# Patient Record
Sex: Female | Born: 1940 | Race: Black or African American | Hispanic: No | Smoking: Never smoker
Health system: Southern US, Community
[De-identification: ages and names within clinical notes are randomized; demographics above are authoritative.]

## PROBLEM LIST (undated history)

## (undated) DIAGNOSIS — E119 Type 2 diabetes mellitus without complications: Secondary | ICD-10-CM

## (undated) DIAGNOSIS — H269 Unspecified cataract: Secondary | ICD-10-CM

## (undated) DIAGNOSIS — M1990 Unspecified osteoarthritis, unspecified site: Secondary | ICD-10-CM

## (undated) DIAGNOSIS — K219 Gastro-esophageal reflux disease without esophagitis: Secondary | ICD-10-CM

## (undated) DIAGNOSIS — R5383 Other fatigue: Secondary | ICD-10-CM

## (undated) DIAGNOSIS — I2510 Atherosclerotic heart disease of native coronary artery without angina pectoris: Secondary | ICD-10-CM

## (undated) DIAGNOSIS — I10 Essential (primary) hypertension: Secondary | ICD-10-CM

## (undated) DIAGNOSIS — Z86010 Personal history of colonic polyps: Secondary | ICD-10-CM

## (undated) DIAGNOSIS — K648 Other hemorrhoids: Secondary | ICD-10-CM

## (undated) DIAGNOSIS — F419 Anxiety disorder, unspecified: Secondary | ICD-10-CM

## (undated) DIAGNOSIS — T7840XA Allergy, unspecified, initial encounter: Secondary | ICD-10-CM

## (undated) DIAGNOSIS — K644 Residual hemorrhoidal skin tags: Secondary | ICD-10-CM

## (undated) DIAGNOSIS — D649 Anemia, unspecified: Secondary | ICD-10-CM

## (undated) DIAGNOSIS — K635 Polyp of colon: Secondary | ICD-10-CM

## (undated) DIAGNOSIS — R002 Palpitations: Secondary | ICD-10-CM

## (undated) DIAGNOSIS — I219 Acute myocardial infarction, unspecified: Secondary | ICD-10-CM

## (undated) DIAGNOSIS — Z79899 Other long term (current) drug therapy: Secondary | ICD-10-CM

## (undated) DIAGNOSIS — E785 Hyperlipidemia, unspecified: Secondary | ICD-10-CM

## (undated) HISTORY — PX: COLONOSCOPY: SHX174

## (undated) HISTORY — DX: Atherosclerotic heart disease of native coronary artery without angina pectoris: I25.10

## (undated) HISTORY — DX: Unspecified osteoarthritis, unspecified site: M19.90

## (undated) HISTORY — DX: Other fatigue: R53.83

## (undated) HISTORY — DX: Other hemorrhoids: K64.8

## (undated) HISTORY — PX: CARDIAC CATHETERIZATION: SHX172

## (undated) HISTORY — DX: Hyperlipidemia, unspecified: E78.5

## (undated) HISTORY — DX: Allergy, unspecified, initial encounter: T78.40XA

## (undated) HISTORY — DX: Anemia, unspecified: D64.9

## (undated) HISTORY — DX: Residual hemorrhoidal skin tags: K64.4

## (undated) HISTORY — DX: Polyp of colon: K63.5

## (undated) HISTORY — DX: Unspecified cataract: H26.9

## (undated) HISTORY — DX: Personal history of colonic polyps: Z86.010

## (undated) HISTORY — DX: Anxiety disorder, unspecified: F41.9

## (undated) HISTORY — PX: APPENDECTOMY: SHX54

## (undated) HISTORY — PX: ABDOMINAL HYSTERECTOMY: SHX81

## (undated) HISTORY — DX: Acute myocardial infarction, unspecified: I21.9

## (undated) HISTORY — PX: CHOLECYSTECTOMY: SHX55

---
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
## 1969-12-29 HISTORY — PX: TUBAL LIGATION: SHX77

## 1998-04-18 ENCOUNTER — Encounter: Admission: RE | Admit: 1998-04-18 | Discharge: 1998-04-18 | Payer: Self-pay | Admitting: Hematology and Oncology

## 1998-04-23 ENCOUNTER — Encounter: Admission: RE | Admit: 1998-04-23 | Discharge: 1998-04-23 | Payer: Self-pay | Admitting: Internal Medicine

## 1998-06-08 ENCOUNTER — Encounter: Admission: RE | Admit: 1998-06-08 | Discharge: 1998-06-08 | Payer: Self-pay | Admitting: Internal Medicine

## 1998-07-06 ENCOUNTER — Encounter: Admission: RE | Admit: 1998-07-06 | Discharge: 1998-07-06 | Payer: Self-pay | Admitting: Internal Medicine

## 1998-07-13 ENCOUNTER — Ambulatory Visit: Admission: RE | Admit: 1998-07-13 | Discharge: 1998-07-13 | Payer: Self-pay | Admitting: *Deleted

## 1998-08-03 ENCOUNTER — Ambulatory Visit: Admission: RE | Admit: 1998-08-03 | Discharge: 1998-08-03 | Payer: Self-pay | Admitting: *Deleted

## 1998-08-31 ENCOUNTER — Encounter: Admission: RE | Admit: 1998-08-31 | Discharge: 1998-08-31 | Payer: Self-pay | Admitting: Internal Medicine

## 1998-09-10 ENCOUNTER — Encounter: Admission: RE | Admit: 1998-09-10 | Discharge: 1998-09-10 | Payer: Self-pay | Admitting: Hematology and Oncology

## 1998-12-03 ENCOUNTER — Encounter: Admission: RE | Admit: 1998-12-03 | Discharge: 1998-12-03 | Payer: Self-pay | Admitting: Internal Medicine

## 1998-12-13 ENCOUNTER — Encounter: Admission: RE | Admit: 1998-12-13 | Discharge: 1998-12-13 | Payer: Self-pay | Admitting: Internal Medicine

## 1999-02-22 ENCOUNTER — Encounter: Admission: RE | Admit: 1999-02-22 | Discharge: 1999-02-22 | Payer: Self-pay | Admitting: Internal Medicine

## 1999-03-06 ENCOUNTER — Encounter: Admission: RE | Admit: 1999-03-06 | Discharge: 1999-03-06 | Payer: Self-pay | Admitting: Hematology and Oncology

## 1999-06-12 ENCOUNTER — Encounter: Admission: RE | Admit: 1999-06-12 | Discharge: 1999-06-12 | Payer: Self-pay | Admitting: Hematology and Oncology

## 1999-08-02 ENCOUNTER — Ambulatory Visit (HOSPITAL_COMMUNITY): Admission: RE | Admit: 1999-08-02 | Discharge: 1999-08-02 | Payer: Self-pay | Admitting: *Deleted

## 1999-11-05 ENCOUNTER — Encounter: Admission: RE | Admit: 1999-11-05 | Discharge: 1999-11-05 | Payer: Self-pay | Admitting: Internal Medicine

## 1999-11-08 ENCOUNTER — Encounter: Admission: RE | Admit: 1999-11-08 | Discharge: 1999-11-08 | Payer: Self-pay | Admitting: Internal Medicine

## 2000-03-27 ENCOUNTER — Ambulatory Visit (HOSPITAL_COMMUNITY): Admission: RE | Admit: 2000-03-27 | Discharge: 2000-03-27 | Payer: Self-pay | Admitting: Internal Medicine

## 2000-03-27 ENCOUNTER — Encounter: Admission: RE | Admit: 2000-03-27 | Discharge: 2000-03-27 | Payer: Self-pay | Admitting: Internal Medicine

## 2000-03-27 ENCOUNTER — Encounter: Payer: Self-pay | Admitting: Internal Medicine

## 2000-04-03 ENCOUNTER — Encounter: Admission: RE | Admit: 2000-04-03 | Discharge: 2000-04-03 | Payer: Self-pay | Admitting: Internal Medicine

## 2000-05-13 ENCOUNTER — Encounter: Admission: RE | Admit: 2000-05-13 | Discharge: 2000-05-13 | Payer: Self-pay | Admitting: Internal Medicine

## 2000-08-10 ENCOUNTER — Encounter: Admission: RE | Admit: 2000-08-10 | Discharge: 2000-08-10 | Payer: Self-pay | Admitting: Internal Medicine

## 2000-08-14 ENCOUNTER — Encounter: Admission: RE | Admit: 2000-08-14 | Discharge: 2000-08-14 | Payer: Self-pay | Admitting: *Deleted

## 2000-09-11 ENCOUNTER — Encounter: Admission: RE | Admit: 2000-09-11 | Discharge: 2000-09-11 | Payer: Self-pay | Admitting: Internal Medicine

## 2000-10-15 ENCOUNTER — Encounter: Admission: RE | Admit: 2000-10-15 | Discharge: 2000-10-15 | Payer: Self-pay | Admitting: Hematology and Oncology

## 2000-12-07 ENCOUNTER — Encounter: Admission: RE | Admit: 2000-12-07 | Discharge: 2000-12-07 | Payer: Self-pay | Admitting: Internal Medicine

## 2000-12-17 ENCOUNTER — Encounter: Admission: RE | Admit: 2000-12-17 | Discharge: 2000-12-17 | Payer: Self-pay | Admitting: Hematology and Oncology

## 2001-03-11 ENCOUNTER — Encounter: Admission: RE | Admit: 2001-03-11 | Discharge: 2001-03-11 | Payer: Self-pay | Admitting: Hematology and Oncology

## 2001-03-11 ENCOUNTER — Ambulatory Visit (HOSPITAL_COMMUNITY): Admission: RE | Admit: 2001-03-11 | Discharge: 2001-03-11 | Payer: Self-pay | Admitting: Hematology and Oncology

## 2001-06-07 ENCOUNTER — Encounter: Admission: RE | Admit: 2001-06-07 | Discharge: 2001-06-07 | Payer: Self-pay | Admitting: Internal Medicine

## 2001-08-20 ENCOUNTER — Encounter: Admission: RE | Admit: 2001-08-20 | Discharge: 2001-08-20 | Payer: Self-pay | Admitting: *Deleted

## 2001-10-06 ENCOUNTER — Encounter: Admission: RE | Admit: 2001-10-06 | Discharge: 2001-10-06 | Payer: Self-pay | Admitting: Internal Medicine

## 2001-11-24 ENCOUNTER — Encounter: Admission: RE | Admit: 2001-11-24 | Discharge: 2001-11-24 | Payer: Self-pay | Admitting: Internal Medicine

## 2002-01-14 ENCOUNTER — Encounter: Admission: RE | Admit: 2002-01-14 | Discharge: 2002-01-14 | Payer: Self-pay | Admitting: Internal Medicine

## 2002-02-25 ENCOUNTER — Ambulatory Visit (HOSPITAL_COMMUNITY): Admission: RE | Admit: 2002-02-25 | Discharge: 2002-02-25 | Payer: Self-pay | Admitting: Family Medicine

## 2002-02-25 ENCOUNTER — Encounter (INDEPENDENT_AMBULATORY_CARE_PROVIDER_SITE_OTHER): Payer: Self-pay | Admitting: Specialist

## 2002-04-20 ENCOUNTER — Encounter: Admission: RE | Admit: 2002-04-20 | Discharge: 2002-04-20 | Payer: Self-pay

## 2002-05-02 ENCOUNTER — Encounter: Admission: RE | Admit: 2002-05-02 | Discharge: 2002-05-02 | Payer: Self-pay | Admitting: Internal Medicine

## 2002-05-16 ENCOUNTER — Ambulatory Visit (HOSPITAL_COMMUNITY): Admission: RE | Admit: 2002-05-16 | Discharge: 2002-05-16 | Payer: Self-pay | Admitting: Internal Medicine

## 2002-05-16 ENCOUNTER — Encounter: Payer: Self-pay | Admitting: Internal Medicine

## 2002-05-16 ENCOUNTER — Encounter: Admission: RE | Admit: 2002-05-16 | Discharge: 2002-05-16 | Payer: Self-pay | Admitting: Internal Medicine

## 2002-05-20 ENCOUNTER — Encounter: Payer: Self-pay | Admitting: *Deleted

## 2002-05-20 ENCOUNTER — Inpatient Hospital Stay (HOSPITAL_COMMUNITY): Admission: EM | Admit: 2002-05-20 | Discharge: 2002-05-23 | Payer: Self-pay | Admitting: *Deleted

## 2002-05-25 ENCOUNTER — Emergency Department (HOSPITAL_COMMUNITY): Admission: EM | Admit: 2002-05-25 | Discharge: 2002-05-25 | Payer: Self-pay | Admitting: Emergency Medicine

## 2002-05-31 ENCOUNTER — Encounter: Admission: RE | Admit: 2002-05-31 | Discharge: 2002-05-31 | Payer: Self-pay | Admitting: Internal Medicine

## 2002-06-13 ENCOUNTER — Encounter: Admission: RE | Admit: 2002-06-13 | Discharge: 2002-06-13 | Payer: Self-pay | Admitting: Internal Medicine

## 2002-06-19 ENCOUNTER — Encounter: Payer: Self-pay | Admitting: Emergency Medicine

## 2002-06-19 ENCOUNTER — Emergency Department (HOSPITAL_COMMUNITY): Admission: EM | Admit: 2002-06-19 | Discharge: 2002-06-19 | Payer: Self-pay | Admitting: Emergency Medicine

## 2002-06-23 ENCOUNTER — Ambulatory Visit: Admission: RE | Admit: 2002-06-23 | Discharge: 2002-06-23 | Payer: Self-pay | Admitting: Internal Medicine

## 2002-06-24 ENCOUNTER — Encounter: Admission: RE | Admit: 2002-06-24 | Discharge: 2002-06-24 | Payer: Self-pay | Admitting: Internal Medicine

## 2002-07-25 ENCOUNTER — Encounter: Admission: RE | Admit: 2002-07-25 | Discharge: 2002-07-25 | Payer: Self-pay | Admitting: Internal Medicine

## 2002-08-08 ENCOUNTER — Encounter: Admission: RE | Admit: 2002-08-08 | Discharge: 2002-08-08 | Payer: Self-pay | Admitting: Internal Medicine

## 2002-08-12 ENCOUNTER — Ambulatory Visit (HOSPITAL_COMMUNITY): Admission: RE | Admit: 2002-08-12 | Discharge: 2002-08-12 | Payer: Self-pay | Admitting: Internal Medicine

## 2002-08-12 ENCOUNTER — Encounter: Payer: Self-pay | Admitting: Internal Medicine

## 2002-08-26 ENCOUNTER — Ambulatory Visit (HOSPITAL_COMMUNITY): Admission: RE | Admit: 2002-08-26 | Discharge: 2002-08-26 | Payer: Self-pay | Admitting: Internal Medicine

## 2002-09-19 ENCOUNTER — Encounter: Admission: RE | Admit: 2002-09-19 | Discharge: 2002-09-19 | Payer: Self-pay | Admitting: Internal Medicine

## 2002-09-20 ENCOUNTER — Ambulatory Visit (HOSPITAL_COMMUNITY): Admission: RE | Admit: 2002-09-20 | Discharge: 2002-09-20 | Payer: Self-pay | Admitting: Internal Medicine

## 2002-09-20 ENCOUNTER — Encounter: Admission: RE | Admit: 2002-09-20 | Discharge: 2002-09-20 | Payer: Self-pay | Admitting: Internal Medicine

## 2002-10-10 ENCOUNTER — Encounter: Admission: RE | Admit: 2002-10-10 | Discharge: 2002-10-10 | Payer: Self-pay | Admitting: Internal Medicine

## 2002-10-14 ENCOUNTER — Encounter: Admission: RE | Admit: 2002-10-14 | Discharge: 2002-10-14 | Payer: Self-pay | Admitting: Internal Medicine

## 2002-12-06 ENCOUNTER — Encounter: Admission: RE | Admit: 2002-12-06 | Discharge: 2002-12-06 | Payer: Self-pay | Admitting: Internal Medicine

## 2002-12-16 ENCOUNTER — Encounter: Payer: Self-pay | Admitting: *Deleted

## 2002-12-16 ENCOUNTER — Ambulatory Visit (HOSPITAL_COMMUNITY): Admission: RE | Admit: 2002-12-16 | Discharge: 2002-12-16 | Payer: Self-pay | Admitting: *Deleted

## 2003-01-11 ENCOUNTER — Encounter: Admission: RE | Admit: 2003-01-11 | Discharge: 2003-01-11 | Payer: Self-pay | Admitting: Internal Medicine

## 2003-01-27 ENCOUNTER — Encounter: Admission: RE | Admit: 2003-01-27 | Discharge: 2003-01-27 | Payer: Self-pay | Admitting: Internal Medicine

## 2003-01-31 ENCOUNTER — Ambulatory Visit (HOSPITAL_COMMUNITY): Admission: RE | Admit: 2003-01-31 | Discharge: 2003-01-31 | Payer: Self-pay | Admitting: Internal Medicine

## 2003-01-31 ENCOUNTER — Encounter: Payer: Self-pay | Admitting: Internal Medicine

## 2003-02-07 ENCOUNTER — Ambulatory Visit (HOSPITAL_COMMUNITY): Admission: RE | Admit: 2003-02-07 | Discharge: 2003-02-07 | Payer: Self-pay | Admitting: Internal Medicine

## 2003-02-14 ENCOUNTER — Encounter: Admission: RE | Admit: 2003-02-14 | Discharge: 2003-02-14 | Payer: Self-pay | Admitting: Internal Medicine

## 2003-05-26 ENCOUNTER — Ambulatory Visit (HOSPITAL_COMMUNITY): Admission: RE | Admit: 2003-05-26 | Discharge: 2003-05-26 | Payer: Self-pay | Admitting: Cardiovascular Disease

## 2003-05-26 ENCOUNTER — Encounter: Payer: Self-pay | Admitting: Cardiovascular Disease

## 2003-09-22 ENCOUNTER — Ambulatory Visit (HOSPITAL_COMMUNITY): Admission: RE | Admit: 2003-09-22 | Discharge: 2003-09-22 | Payer: Self-pay | Admitting: Cardiovascular Disease

## 2003-09-22 ENCOUNTER — Encounter: Payer: Self-pay | Admitting: Cardiovascular Disease

## 2004-04-01 ENCOUNTER — Encounter: Admission: RE | Admit: 2004-04-01 | Discharge: 2004-04-01 | Payer: Self-pay | Admitting: Cardiovascular Disease

## 2004-10-03 ENCOUNTER — Encounter: Admission: RE | Admit: 2004-10-03 | Discharge: 2004-10-03 | Payer: Self-pay | Admitting: Cardiovascular Disease

## 2005-10-22 ENCOUNTER — Encounter: Admission: RE | Admit: 2005-10-22 | Discharge: 2005-10-22 | Payer: Self-pay | Admitting: Cardiovascular Disease

## 2006-10-23 ENCOUNTER — Encounter: Admission: RE | Admit: 2006-10-23 | Discharge: 2006-10-23 | Payer: Self-pay | Admitting: Cardiovascular Disease

## 2007-02-23 ENCOUNTER — Encounter (HOSPITAL_COMMUNITY): Admission: RE | Admit: 2007-02-23 | Discharge: 2007-02-23 | Payer: Self-pay | Admitting: Cardiovascular Disease

## 2007-02-26 ENCOUNTER — Ambulatory Visit (HOSPITAL_COMMUNITY): Admission: RE | Admit: 2007-02-26 | Discharge: 2007-02-26 | Payer: Self-pay | Admitting: Cardiovascular Disease

## 2007-10-08 ENCOUNTER — Emergency Department (HOSPITAL_COMMUNITY): Admission: EM | Admit: 2007-10-08 | Discharge: 2007-10-08 | Payer: Self-pay | Admitting: Emergency Medicine

## 2007-10-26 ENCOUNTER — Encounter: Admission: RE | Admit: 2007-10-26 | Discharge: 2007-10-26 | Payer: Self-pay | Admitting: Cardiovascular Disease

## 2007-11-22 ENCOUNTER — Inpatient Hospital Stay (HOSPITAL_COMMUNITY): Admission: EM | Admit: 2007-11-22 | Discharge: 2007-11-24 | Payer: Self-pay | Admitting: Emergency Medicine

## 2008-10-27 ENCOUNTER — Encounter: Admission: RE | Admit: 2008-10-27 | Discharge: 2008-10-27 | Payer: Self-pay | Admitting: Cardiovascular Disease

## 2009-04-20 ENCOUNTER — Ambulatory Visit (HOSPITAL_COMMUNITY): Admission: RE | Admit: 2009-04-20 | Discharge: 2009-04-20 | Payer: Self-pay | Admitting: Obstetrics & Gynecology

## 2009-10-30 ENCOUNTER — Encounter: Admission: RE | Admit: 2009-10-30 | Discharge: 2009-10-30 | Payer: Self-pay | Admitting: Cardiovascular Disease

## 2010-10-17 ENCOUNTER — Encounter: Admission: RE | Admit: 2010-10-17 | Discharge: 2010-10-17 | Payer: Self-pay | Admitting: Cardiovascular Disease

## 2010-11-01 ENCOUNTER — Encounter: Admission: RE | Admit: 2010-11-01 | Discharge: 2010-11-01 | Payer: Self-pay | Admitting: Cardiovascular Disease

## 2011-05-13 NOTE — Discharge Summary (Signed)
NAMEShiniqua, [name]                  ACCOUNT NO.:  [id number]   MEDICAL RECORD NO.:  [id number]          PATIENT TYPE:  INP   LOCATION:  3743                         FACILITY:  [facility]   PHYSICIAN:  Mohan N. Sharyn Lull, M.D. DATE OF BIRTH:  04/12/1941   DATE OF ADMISSION:  11/22/2007  DATE OF DISCHARGE:  11/24/2007                               DISCHARGE SUMMARY   ADMITTING DIAGNOSIS:  1. Status post syncope, rule out cardiac arrhythmias, rule out      hypoglycemia, doubt cerebrovascular accident.  2. Leukocytosis.  3. Rule out sepsis or bronchitis.  4. Rule out pneumonia.  5. Hypertension.  6. Diabetes mellitus.  7. Hypercholesteremia.  8. History of bronchial asthma.   DISCHARGE DIAGNOSIS:  1. Status post syncope probably secondary to vasovagal versus      hypoglycemia.  2. Status post leukocytosis.  3. Resolving bronchitis.  4. Hypertension.  5. Diabetes mellitus.  6. Hypercholesteremia.  7. History of bronchial asthma.   DISCHARGE HOME MEDICATIONS:  1. Metoprolol XL 50 mg one tablet daily.  2. Protonix 40 mg one tablet daily.  3. Lipitor 40 mg p.o. daily.  4. Enteric-coated aspirin 81 mg p.o. daily.  5. Darvocet-N 100 1 tablet q.8 h. as needed.  6. Singulair 10 mg one tablet daily.  7. Glipizide has been reduced to 5 mg 1 tablet daily.  8. Nitrostat sublingual 0.4 mg as needed.  9. Albuterol inhaler 2 puffs q.6 h. as needed.  10.Avelox 400 mg one tablet daily x 5 days.   DIET:  Low salt, low cholesterol, 1800 calories, ADA diet.  The patient  has been advised to monitor blood sugar twice daily.   FOLLOWUP:  With Dr. Algie Coffer in 2 weeks.   CONDITION AT DISCHARGE:  Stable.   BRIEF HISTORY AND HOSPITAL COURSE:  [name] is a 70 year old black  female with past medical history significant for hypertension, non-  insulin-dependent diabetes mellitus, hypercholesteremia, and history of  bronchial asthma.  She came to the ER via EMS following syncopal  episode.  States she  went to see the ophthalmologist and when she came  home while eating dinner, slipped on the floor.  Denies any chest pain,  dizziness, or palpitation prior to passing out.  She denies seizure  activity or head trauma.  Denies any incontinence of urine or feces,  denies any weakness in the arms or legs.  Denies slurred speech.  The  patient denies checking the blood sugar at the time of the syncopal  episode, but after EMS was noted to have blood sugar of 131.  The  patient complains of chronic right-sided headache, and is being followed  by ophthalmologist.   PAST MEDICAL HISTORY:  As above.   PAST SURGICAL HISTORY:  She had cholecystectomy in the past,  hysterectomy in the past, tubal ligation in the past.   ALLERGIES:  She has intolerance to aspirin.   MEDICATION AT HOME:  As above, except glipizide which  she was taking  twice a day.   SOCIAL HISTORY:  She is separated, three children.  No history of  smoking or alcohol abuse, retired.  She worked in [employer] in the  past.   FAMILY HISTORY:  Father died of stroke in his 28s.  Mother died of old  age at the age of 10.  She had MI x5 and subsequently had CABG.  Grandmother died of stroke.  One brother had stroke and heart attack.  One sister had an MI.   PHYSICAL EXAMINATION:  GENERAL:  On examination she is alert and  oriented x3 in no acute distress.  VITAL SIGNS:  Blood pressure is 116/63, pulse was 83, regular.  Conjunctivae was pink.  NECK:  Supple.  No JVD.  LUNGS:  Clear to auscultation without rhonchi or rales.  CARDIOVASCULAR:  Regular rate, no murmurs.  S1-S2 was normal.  There was  a soft systolic murmur.  ABDOMEN:  Soft.  Bowel sounds were present, nontender.  EXTREMITIES:  There was no clubbing, cyanosis or edema.  NEURO:  Grossly intact.   LABS:  EKG showed normal sinus rhythm with LVH with strain pattern.  Her  hemoglobin was 14.2, hematocrit 42.6, white count of 22.9.  Repeat white  count, today, is  11.7, hemoglobin 12.6, hematocrit 38.  BUN was 30,  potassium 1.4.  Today, BUN is 14, creatinine 1.05, potassium is 3.6.  A  CT of the head was negative.  Chest x-ray was negative.   The patient was admitted to telemetry unit.  The patient did not have  any episode of cardiac arrhythmias during the hospital stay.  The  patient had one episode of hypoglycemia despite reducing the dose of  glipizide, with a blood sugar of 43.  The patient's CT of the brain was  negative.  Neurologically, the patient she shows no evidence of neuro  deficit.  The patient will be discharged home on the above medications  and will be followed up by Dr. Algie Coffer in 2 weeks.     Eduardo Osier. Sharyn Lull, M.D.  Electronically Signed    MNH/MEDQ  D:  11/24/2007  T:  11/24/2007  Job:  [id number]

## 2011-05-16 NOTE — Consult Note (Signed)
[place]. [facility]  Patient:    Heidi Wolf, [name] [facility] M Visit Number: [id number] MRN: [id number]          Service Type: MED Location: [phone] Attending Physician:  Karn Pickler Dictated by:   Kerrie Pleasure, M.D. Admit Date:  05/20/2002 Discharge Date: 05/23/2002                            Consultation Report  REFERRING PHYSICIAN:  Alvester Morin, M.D.  REASON FOR CONSULTATION:  Evaluation of chest pain.  HISTORY OF PRESENT ILLNESS:  [name] is a 70 year old African-American female with a history of longstanding hypertension, GERD, hyperlipidemia, and colonic polyps.  She was subsequently treated for "bronchitis."  She presented to the outpatient clinic with history of recurrent chest pain over the last couple of weeks that has been on and off but has now become more constant and progressive at a 6 to 8 out of 10.  The pain was described as pressure-like, worsened by exertion that has made the patient unable to do her ADLs.  The pain is partially relieved by rest.  The pain also radiates to her neck and her left arm and associated with some diaphoresis but no fever.  The patient has had a cough that has decreased over the last week since she has been given doxycycline for presumed treatment of bronchitis.  She was asked to come to the ER where she was given nitroglycerin and a GI cocktail.  The pain has eased up a little after nitroglycerin.  The patient has extensive cardiac risk factors including her hypertension, family history of coronary artery disease, and hyperlipidemia.  PAST MEDICAL HISTORY:  Hypertension, hyperlipidemia, GERD, guaiac positive stool for which she was status post EGD and colonoscopy.  ALLERGIES:  The patient had allergy to ASPIRIN but it mainly causes GI discomfort.  MEDICATIONS: 1. At this point, she has been placed on aspirin 325 mg q.d. 2. Lotensin 20 mg q.d. 3. Dyazide 37.5 mg q.d. 4. Doxycycline 100 mg p.o.  b.i.d. 5. Protonix 40 mg q.d. 6. Lipitor 10 mg q.d. 7. K-Dur 1 q.d.  SOCIAL HISTORY:  Lives in [place].  She is separated from her husband.  She has three grandchildren.  She works as a Printmaker.  She denies any tobacco or alcohol history.  FAMILY HISTORY:  Significant for her mom having coronary artery disease at the age of 7s.  She died of a MI in her 1s.  Dad died of CVA.  She has two siblings, both with coronary artery disease with onset in their 68s.  REVIEW OF SYSTEMS:  Essentially as per HPI.  PHYSICAL EXAMINATION:  VITAL SIGNS:  Temperature 98.8, blood pressure 106/59, pulse of 72, respiratory rate of 18.  O2 saturation of 95% on room air.  GENERAL:  She is stable, alert, and oriented.  Not in acute distress.  HEENT:  PERRL.  EOMI.  CHEST:  Clear to auscultation and symmetrical.  NECK:  Supple.  No JVD.  No lymphadenopathy.  CARDIOVASCULAR:  Regular rate and rhythm.  She did have audible S3 and S4.  ABDOMEN:  Obese, soft, nontender, and nondistended.  Positive bowel sounds.  EXTREMITIES:  No clubbing, cyanosis, or edema.  LABORATORY DATA:  Her initial labs included a white count of 9.7, hemoglobin 13, platelets 269,000.  Sodium 135, potassium 3.5, chloride 104, CO2 25, BUN 13, creatinine 0.9, glucose 113, and calcium 9.3.  Her cardiac enzymes showed a CK of 195, 160, and 177 respectively.  MB fraction of 4.1 to 6 respectively and an index of 2.1, 2.8, to 3.4.  Her troponin was flat at 0.02 x 3.  Her EKG shows a normal rate, normal axis, question of a prolonged QTC. Otherwise, no ST segment changes.  IMPRESSION:  This is a 70 year old African-American female with cardiac risk factors who presented with exertional chest pain and had some mild enzyme changes.  ASSESSMENT/PLAN: 1. Chest pain:  This is gone right now.  Probably secondary to unstable    angina.  The patient will benefit from cardiac catheterization at this    point mainly  because of her risk factors and the fact that she came in with    with this chest pain, especially given her family history, history of    hypertension and dyslipidemia.  She is really at high risk for coronary    artery disease.  So, we will proceed with coronary angiography.  The    options I discussed with the patient including the risks and benefits    of obtaining a catheterization at this point.  Other recommendations will    be to do a 2-D echocardiogram on the patient, assess her lipid function,    enteric-coated aspirin, start heparin, some Plavix, and then a beta    blocker.  We will also continue recycle her enzymes.  If her chest pain    returns, we will start nitroglycerin drip. 2. Hypertension:  Her blood pressure seems to be reasonably controlled at    this point.  So, we will make no changes. 3. Hyperlipidemia:  The patient is already on Lipitor and we will check her    lipid profile for bronchitis.  The patient was already taking doxycycline.    She is in her first week.  We will let her continue to this dose of    doxycycline. Dictated by:   Kerrie Pleasure, M.D. Attending Physician:  Karn Pickler DD:  05/21/02 TD:  05/24/02 Job: [id number]

## 2011-05-16 NOTE — Cardiovascular Report (Signed)
Heidi Wolf, Heidi Wolf                  ACCOUNT NO.:  [id number]   MEDICAL RECORD NO.:  [id number]          PATIENT TYPE:  OIB   LOCATION:  2899                         FACILITY:  [facility]   PHYSICIAN:  Ricki Rodriguez, M.D.  DATE OF BIRTH:  19-Jan-1941   DATE OF PROCEDURE:  02/26/2007  DATE OF DISCHARGE:                            CARDIAC CATHETERIZATION   The patient was seen as an outpatient.   PROCEDURES:  1. Left heart catheterization.  2. Selective coronary angiography.  3. Left ventricular function study.   INDICATIONS:  This 70 year old black female had recurrent chest pain,  abnormal nuclear stress test along with diabetes and hypertension.   APPROACH:  Right femoral artery using 4-French sheath and catheters.   COMPLICATIONS:  None.   Less than 50 mL of dye was used.   HEMODYNAMIC DATA:  The left ventricular pressure was 130/13 and the  aortic pressure was 131/72.   CORONARY ANATOMY:  1. The left main coronary artery was short and unremarkable.  2. Left anterior descending coronary artery:  The left anterior      descending coronary artery was unremarkable.  3. Left circumflex coronary artery:  The left circumflex coronary      artery was also unremarkable and its obtuse marginal branches were      also normal.  4. Right coronary artery:  The right artery was dominant and      unremarkable.   LEFT VENTRICULOGRAM:  The left ventriculogram showed hyperdynamic wall  motion with ejection fraction of 75%.   IMPRESSION:  1. Normal coronaries.  2. Hypertensive heart disease.   RECOMMENDATIONS:  This patient will be treated medically, and I will  continue her home medications with the increase in her potassium intake.      Ricki Rodriguez, M.D.  Electronically Signed     ASK/MEDQ  D:  02/26/2007  T:  02/26/2007  Job:  [id number]

## 2011-05-16 NOTE — Discharge Summary (Signed)
[place]. [facility]  Patient:    Heidi Wolf, Heidi Wolf Visit Number: [id number] MRN: [id number]          Service Type: MED Location: [phone] Attending Physician:  Karn Pickler Dictated by:   Asencion Partridge. Neva Seat, M.D. Admit Date:  05/20/2002 Discharge Date: 05/23/2002   CC:         Outpatient Clinic   Discharge Summary  DATE OF BIRTH:  Aug 10, 1941  ADMISSION DIAGNOSES: 1. Chest pain. 2. Hypertension. 3. Hyperlipidemia. 4. Acute bronchitis.  DISCHARGE DIAGNOSES: 1. Noncardiac chest pain. 2. Hypertension. 3. Hyperlipidemia. 4. Acute bronchitis.  HISTORY OF PRESENT ILLNESS:  The patient is a 70 year old African-American female with a history of hypertension and hypercholesterolemia who presented to the ER with substernal chest pain radiating to neck and into chest, worse that morning.  Also with palpitations, diaphoresis, shortness of breath, and headache, and nausea.  No emesis and no diarrhea.  The patient was diagnosed with an acute bronchitis and has been on doxycycline b.i.d. for four days. The patient says she has had symptoms for five weeks in regards to her cough, initially treated with Allegra and a nose spray.  The patients acute chest pain had been present that morning.  The patient denied paroxysmal nocturnal dyspnea, however, did have some slight dyspnea on exertion which has been notably increasing, worse with housework.  Chest pain had for the most part resolved, but slightly present in the ER.  No attempt at treatments.  MEDICATIONS: 1. Lotensin 20 mg p.o. q.d. 2. Hydrochlorothiazide 25 mg p.o. q.d. 3. Lipitor 10 mg p.o. q.h.s. 4. Doxycycline 100 mg p.o. b.i.d. 5. Protonix 40 mg p.o. q.d.  FAMILY HISTORY:  Family history of coronary artery disease, as her mother had five MIs.  She has a brother with history of two CVAs and one MI.  Two sisters, one with angina and other with an MI in her 27s.  Also with diabetes in the  mother, sister, nieces, and daughter.  SOCIAL HISTORY:  The patient is a nonsmoker.  PHYSICAL EXAMINATION:  VITAL SIGNS: Temperature 97.8, blood pressure 129/79, pulse 114, respiratory rate 29, 80% sat on room air.  GENERAL: She is in no acute distress.  For the most part comfortable.  HEART: Regular rate and rhythm with no murmurs, rubs, or gallops.  No heaves, rubs, or thrills. Peripheral pulses were intact.  She was nondiaphoretic.  LABORATORY DATA:  Sodium 136, potassium 3.4, chloride 102, bicarb 25, BUN 22, creatinine 1.0, glucose 113, calcium 9.9.  AST 32, ALT 33, alkaline phosphatase 59, total bilirubin 0.8, albumin 4.0, total protein 7.5.  CBC; white blood cell count 11.2, hemoglobin 13.5, hematocrit 39, platelets 279, 66% neutrophils, ANC 7.4.  Chest x-ray showed no acute disease, no change from prior chest x-ray.  EKG was normal sinus rhythm, somewhat flattened Ts, however, no ST changes.  CK 195, CK-MB 4.1, index 2.1, troponin 0.02.  HOSPITAL COURSE:  The patient was admitted to the medicine teaching service B attending Dr. Eugenio Hoes for rule out MI as she had a strong family history and other risk factors including hypertension, her age, and hyperlipidemia.  Symptoms were also noted to be typical which is atypical for a female having MI, however, with the patients strong history was admitted, placed on her statin, low dose beta blocker, sublingual nitroglycerin p.r.n. which she did not require, and underwent serial enzymes.  #1 - Chest pain.  On the morning after admission chest pain was much less,  however, cardiology consult was obtained as the patient with several risk factors and a strong family history.  Decision made by cardiology to undergo cardiac catheterization.  Cardiac catheterization performed on May 23, 2002, was completely negative for coronary artery disease, negative for mitral regurgitation, ejection fraction was 65%.  Per cardiology this was  noncardiac chest pain and the patient was stable for discharge.  #2 - Hypertension.  The patient was restarted on her home regimen of Lotensin 20 mg p.o. q.d. and hydrochlorothiazide 25 mg q.d. in addition to metoprolol 12.5 mg b.i.d. for cardioprotective effects.  The patients blood pressure remained stable throughout hospitalization and as she was normotensive on admission, she was discharged on her home regimen.  #3 - Hypercholesterolemia.  The patient with hypercholesterolemia by history, on Lipitor at home, was placed on Zocor in the hospital.  Fasting lipid panel obtained in the hospital; total cholesterol 216, triglycerides 246, HDL 44, LDL 123.  The patient discharged to home on same previously which is Lipitor 10 mg p.o. q.h.s.  #4 - Bronchitis.  Symptoms appear to be improving.  The patient was continued on doxycycline 100 mg b.i.d. in hospital.  Will discharge her home on same dose and was to complete her regimen of doxycycline for treatment of acute bronchitis.  #5 - The patient with history of GERD, was continued on Protonix during hospitalization and discharged to home on the same.  The patient has a "allergy" to aspirin in the past, but stated that she did not take this because of the possible stomach problems, however, as recommended by cardiology postcath, the patient was started on 81 mg enteric-coated aspirin q.d.  She had received 325 mg in the hospital, however, discharging her home on 81 mg p.o. q.d.  Decision to stop this may be made by the patients primary M.D. if so desired, however, she will be on Protonix for GI protection.  CONDITION ON DISCHARGE:  The patient is in stable condition for discharge on May 23, 2002.  DISCHARGE MEDICATIONS: 1. Lotensin 20 mg one tablet p.o. q.d. 2. Lipitor 10 mg one tablet p.o. q.h.s. 3. Doxycycline 100 mg one tablet p.o. b.i.d. 4. Protonix 40 mg one tablet p.o. q.d. 5. Hydrochlorothiazide 25 mg one tablet p.o. q.d.  6.  Enteric-coated aspirin 81 mg one tablet p.o. q.d.  ACTIVITY:  The patient was instructed on no lifting, driving, sexual activity, or heavy exertion for two days.  DIET:  Low salt, low fat, low cholesterol diet.  The patient was to observe the cardiac catheterization site for bruising, swelling, drainage, or discomfort and was given the number [phone] if any problems were noted.  FOLLOW-UP:  The patient was to call the [facility] at [phone] to schedule an appointment with her primary physician, Dr. Darnelle Catalan in two weeks for a cardiac catheterization site groin wound check and for hospital follow-up. Dictated by:   Asencion Partridge Neva Seat, M.D. Attending Physician:  Karn Pickler DD:  05/23/02 TD:  05/25/02 Job: [id number]

## 2011-05-16 NOTE — Cardiovascular Report (Signed)
[place]. [facility]  Patient:    Heidi Wolf, [place] [facility] M Visit Number: [id number] MRN: [id number]          Service Type: MED Location: [phone] Attending Physician:  Karn Pickler Dictated by:   Lewayne Bunting, M.D. [facility] Proc. Date: 05/23/02 Admit Date:  05/20/2002 Discharge Date: 05/23/2002   CC:         Dr. Gaspar Skeeters, M.D.   Cardiac Catheterization  DATE OF BIRTH: September 25, 1941  REFERRING PHYSICIAN: Dr. Eugenio Hoes with [facility] Service.  CARDIOLOGIST: Veneda Melter, M.D.  PROCEDURES PERFORMED: 1. Left heart catheterization with selective coronary angiography. 2. Ventriculography.  DIAGNOSES: 1. No significant flow-limiting epicardial coronary artery disease. 2. Normal left ventricular systolic function.  INDICATIONS: The patient is a 70 year old female with cardiac risk factors, who was admitted with substernal chest pain. The patient has ruled out for myocardial infarction. She was referred for a diagnostic catheterization to assess her coronary anatomy.  DESCRIPTION OF PROCEDURE: After informed consent was obtained, the patient was brought to the catheterization laboratory. The right groin was sterilely prepped and draped.  Lidocaine 1% was infiltrated. A 6 French arterial sheath was placed using the modified Seldinger technique.  Subsequently, a 6 [place] and JR4 standard left and right heart catheters were used to engage the left and right coronary ostia, respectively.  Selective coronary angiography was performed in various projections using manual injections of contrast. Following coronary angiography, a 6 French angled pigtail catheter was placed in the left ventricular cavity. Appropriate left-sided hemodynamics were obtained. Ventriculography was then performed in a single plane RAO projection using power injection of contrast. Subsequently, the catheter was pulled back and all catheter and sheaths  were removed and the patient was brought back to the holding area. No complications were encountered. Adequate hemostasis was provided.  FINDINGS:  HEMODYNAMICS: Left ventricular pressure 117/20 mmHg, aortic pressure 117/77 mmHg.  There was no gradient on aortic pullback.  VENTRICULOGRAPHY: Ejection fraction 65% with no segmental wall motion abnormalities.  There was no mitral regurgitation noted.  SELECTIVE CORONARY ANGIOGRAPHY: 1. Left main coronary is a large caliber vessel with no evidence of    flow-limiting CAD. 2. Left anterior descending artery was a large caliber vessel wrapping    around the apex with no evidence of flow-limiting CAD. 3. Left circumflex is a large caliber vessel also giving rise to two large    obtuse marginal branches with no evidence of flow-limiting disease. 4. The right coronary artery was free of flow-limiting epicardial coronary    artery disease.  RECOMMENDATIONS: Further medical therapy for noncardiac chest pain as indicated. The patient can be discharged after four hours of bed rest later today. No further cardiac work-up is needed. At this point in time, the patients chest pain origin appears to be of noncardiac nature. Dictated by:   Lewayne Bunting, M.D. [facility] Attending Physician:  Karn Pickler DD:  05/23/02 TD:  05/24/02 Job: [id number]

## 2011-09-21 ENCOUNTER — Emergency Department (HOSPITAL_COMMUNITY)
Admission: EM | Admit: 2011-09-21 | Discharge: 2011-09-22 | Disposition: A | Discharge status: Home / Self Care | Payer: Medicare Other | Attending: Emergency Medicine | Admitting: Emergency Medicine

## 2011-09-21 DIAGNOSIS — I498 Other specified cardiac arrhythmias: Secondary | ICD-10-CM | POA: Insufficient documentation

## 2011-09-21 DIAGNOSIS — R0602 Shortness of breath: Secondary | ICD-10-CM | POA: Insufficient documentation

## 2011-09-21 DIAGNOSIS — R079 Chest pain, unspecified: Secondary | ICD-10-CM | POA: Insufficient documentation

## 2011-09-21 DIAGNOSIS — I10 Essential (primary) hypertension: Secondary | ICD-10-CM | POA: Insufficient documentation

## 2011-09-21 DIAGNOSIS — Z79899 Other long term (current) drug therapy: Secondary | ICD-10-CM | POA: Insufficient documentation

## 2011-09-21 DIAGNOSIS — E119 Type 2 diabetes mellitus without complications: Secondary | ICD-10-CM | POA: Insufficient documentation

## 2011-09-21 LAB — POCT I-STAT TROPONIN I: Troponin i, poc: 0.01 ng/mL (ref 0.00–0.08)

## 2011-09-22 ENCOUNTER — Emergency Department (HOSPITAL_COMMUNITY): Payer: Medicare Other

## 2011-09-22 LAB — CBC
HCT: 36.4 % (ref 36.0–46.0)
Hemoglobin: 12.1 g/dL (ref 12.0–15.0)
MCH: 26.8 pg (ref 26.0–34.0)
MCHC: 33.2 g/dL (ref 30.0–36.0)
MCV: 80.7 fL (ref 78.0–100.0)
Platelets: 262 10*3/uL (ref 150–400)
RBC: 4.51 MIL/uL (ref 3.87–5.11)
RDW: 14.5 % (ref 11.5–15.5)
WBC: 9.7 10*3/uL (ref 4.0–10.5)

## 2011-09-22 LAB — DIFFERENTIAL
Basophils Absolute: 0 10*3/uL (ref 0.0–0.1)
Basophils Relative: 0 % (ref 0–1)
Eosinophils Absolute: 0.1 10*3/uL (ref 0.0–0.7)
Eosinophils Relative: 1 % (ref 0–5)
Lymphocytes Relative: 36 % (ref 12–46)
Lymphs Abs: 3.4 10*3/uL (ref 0.7–4.0)
Monocytes Absolute: 0.6 10*3/uL (ref 0.1–1.0)
Monocytes Relative: 6 % (ref 3–12)
Neutro Abs: 5.6 10*3/uL (ref 1.7–7.7)
Neutrophils Relative %: 58 % (ref 43–77)

## 2011-09-22 LAB — BASIC METABOLIC PANEL
BUN: 16 mg/dL (ref 6–23)
CO2: 26 mEq/L (ref 19–32)
Calcium: 8.9 mg/dL (ref 8.4–10.5)
Chloride: 106 mEq/L (ref 96–112)
Creatinine, Ser: 0.95 mg/dL (ref 0.50–1.10)
GFR calc Af Amer: >60 mL/min (ref >60)
GFR calc non Af Amer: 58 mL/min — ABNORMAL LOW (ref >60)
Glucose, Bld: 127 mg/dL — ABNORMAL HIGH (ref 70–99)
Potassium: 3.2 mEq/L — ABNORMAL LOW (ref 3.5–5.1)
Sodium: 140 mEq/L (ref 135–145)

## 2011-09-24 ENCOUNTER — Inpatient Hospital Stay (HOSPITAL_COMMUNITY)
Admission: AD | Admit: 2011-09-24 | Discharge: 2011-09-25 | DRG: 287 | Disposition: A | Discharge status: Home / Self Care | Payer: Medicare Other | Source: Ambulatory Visit | Attending: Cardiovascular Disease | Admitting: Cardiovascular Disease

## 2011-09-24 ENCOUNTER — Encounter (HOSPITAL_COMMUNITY): Payer: Self-pay | Admitting: Radiology

## 2011-09-24 ENCOUNTER — Inpatient Hospital Stay (HOSPITAL_COMMUNITY): Payer: Medicare Other

## 2011-09-24 DIAGNOSIS — Z79899 Other long term (current) drug therapy: Secondary | ICD-10-CM

## 2011-09-24 DIAGNOSIS — E7800 Pure hypercholesterolemia, unspecified: Secondary | ICD-10-CM | POA: Diagnosis present

## 2011-09-24 DIAGNOSIS — E119 Type 2 diabetes mellitus without complications: Secondary | ICD-10-CM | POA: Diagnosis present

## 2011-09-24 DIAGNOSIS — E785 Hyperlipidemia, unspecified: Secondary | ICD-10-CM | POA: Diagnosis present

## 2011-09-24 DIAGNOSIS — Z7982 Long term (current) use of aspirin: Secondary | ICD-10-CM

## 2011-09-24 DIAGNOSIS — I10 Essential (primary) hypertension: Secondary | ICD-10-CM | POA: Diagnosis present

## 2011-09-24 DIAGNOSIS — R0789 Other chest pain: Principal | ICD-10-CM | POA: Diagnosis present

## 2011-09-24 HISTORY — DX: Essential (primary) hypertension: I10

## 2011-09-24 LAB — CBC
HCT: 39.8 % (ref 36.0–46.0)
Hemoglobin: 13.2 g/dL (ref 12.0–15.0)
MCH: 26.6 pg (ref 26.0–34.0)
MCHC: 33.2 g/dL (ref 30.0–36.0)
MCV: 80.1 fL (ref 78.0–100.0)
Platelets: 274 10*3/uL (ref 150–400)
RBC: 4.97 MIL/uL (ref 3.87–5.11)
RDW: 14.2 % (ref 11.5–15.5)
WBC: 9.3 10*3/uL (ref 4.0–10.5)

## 2011-09-24 LAB — DIFFERENTIAL
Basophils Absolute: 0 10*3/uL (ref 0.0–0.1)
Basophils Relative: 0 % (ref 0–1)
Eosinophils Absolute: 0.1 10*3/uL (ref 0.0–0.7)
Eosinophils Relative: 1 % (ref 0–5)
Lymphocytes Relative: 29 % (ref 12–46)
Lymphs Abs: 2.7 10*3/uL (ref 0.7–4.0)
Monocytes Absolute: 0.6 10*3/uL (ref 0.1–1.0)
Monocytes Relative: 7 % (ref 3–12)
Neutro Abs: 5.8 10*3/uL (ref 1.7–7.7)
Neutrophils Relative %: 63 % (ref 43–77)

## 2011-09-24 LAB — CARDIAC PANEL(CRET KIN+CKTOT+MB+TROPI)
CK, MB: 6.6 ng/mL (ref 0.3–4.0)
Relative Index: 2.5 (ref 0.0–2.5)
Total CK: 268 U/L — ABNORMAL HIGH (ref 7–177)
Troponin I: <0.3 ng/mL (ref <0.30)

## 2011-09-24 LAB — PROTIME-INR
INR: 1.01 (ref 0.00–1.49)
Prothrombin Time: 13.5 seconds (ref 11.6–15.2)

## 2011-09-24 LAB — APTT: aPTT: 27 seconds (ref 24–37)

## 2011-09-25 LAB — CARDIAC PANEL(CRET KIN+CKTOT+MB+TROPI)
CK, MB: 6.7 ng/mL (ref 0.3–4.0)
CK, MB: 5.6 ng/mL — ABNORMAL HIGH (ref 0.3–4.0)
Relative Index: 2.6 — ABNORMAL HIGH (ref 0.0–2.5)
Relative Index: 2.5 (ref 0.0–2.5)
Total CK: 260 U/L — ABNORMAL HIGH (ref 7–177)
Total CK: 223 U/L — ABNORMAL HIGH (ref 7–177)
Troponin I: <0.3 ng/mL (ref <0.30)
Troponin I: <0.3 ng/mL (ref <0.30)

## 2011-09-25 LAB — POCT I-STAT, CHEM 8
BUN: 11 mg/dL (ref 6–23)
Calcium, Ion: 1.19 mmol/L (ref 1.12–1.32)
Chloride: 91 mEq/L — ABNORMAL LOW (ref 96–112)
Creatinine, Ser: 0.7 mg/dL (ref 0.50–1.10)
Glucose, Bld: 92 mg/dL (ref 70–99)
HCT: 42 % (ref 36.0–46.0)
Hemoglobin: 14.3 g/dL (ref 12.0–15.0)
Potassium: 3.2 mEq/L — ABNORMAL LOW (ref 3.5–5.1)
Sodium: 127 mEq/L — ABNORMAL LOW (ref 135–145)
TCO2: 20 mmol/L (ref 0–100)

## 2011-09-25 LAB — LIPID PANEL
Cholesterol: 212 mg/dL — ABNORMAL HIGH (ref 0–200)
HDL: 38 mg/dL — ABNORMAL LOW (ref >39)
LDL Cholesterol: 142 mg/dL — ABNORMAL HIGH (ref 0–99)
Total CHOL/HDL Ratio: 5.6 RATIO
Triglycerides: 161 mg/dL — ABNORMAL HIGH (ref <150)
VLDL: 32 mg/dL (ref 0–40)

## 2011-09-25 LAB — BASIC METABOLIC PANEL
BUN: 13 mg/dL (ref 6–23)
CO2: 25 mEq/L (ref 19–32)
Calcium: 9.7 mg/dL (ref 8.4–10.5)
Chloride: 103 mEq/L (ref 96–112)
Creatinine, Ser: 0.83 mg/dL (ref 0.50–1.10)
GFR calc Af Amer: >60 mL/min (ref >60)
GFR calc non Af Amer: >60 mL/min (ref >60)
Glucose, Bld: 93 mg/dL (ref 70–99)
Potassium: 3.7 mEq/L (ref 3.5–5.1)
Sodium: 140 mEq/L (ref 135–145)

## 2011-09-25 LAB — GLUCOSE, CAPILLARY
Glucose-Capillary: 102 mg/dL — ABNORMAL HIGH (ref 70–99)
Glucose-Capillary: 97 mg/dL (ref 70–99)
Glucose-Capillary: 118 mg/dL — ABNORMAL HIGH (ref 70–99)
Glucose-Capillary: 94 mg/dL (ref 70–99)
Glucose-Capillary: 107 mg/dL — ABNORMAL HIGH (ref 70–99)

## 2011-09-29 ENCOUNTER — Other Ambulatory Visit: Payer: Self-pay | Admitting: Cardiovascular Disease

## 2011-09-29 DIAGNOSIS — Z1231 Encounter for screening mammogram for malignant neoplasm of breast: Secondary | ICD-10-CM

## 2011-10-07 LAB — CBC
HCT: 38
HCT: 38.7
HCT: 42.6
Hemoglobin: 12.6
Hemoglobin: 12.8
Hemoglobin: 14.2
MCHC: 33.1
MCHC: 33.1
MCHC: 33.3
MCV: 80.9
MCV: 81.4
MCV: 81.9
Platelets: 298
Platelets: 315
Platelets: 371
RBC: 4.64
RBC: 4.76
RBC: 5.26 — ABNORMAL HIGH
RDW: 13.7
RDW: 14.1
RDW: 14.4
WBC: 11.7 — ABNORMAL HIGH
WBC: 14.1 — ABNORMAL HIGH
WBC: 22.9 — ABNORMAL HIGH

## 2011-10-07 LAB — LIPID PANEL
Cholesterol: 144
HDL: 38 — ABNORMAL LOW
LDL Cholesterol: 80
Total CHOL/HDL Ratio: 3.8
Triglycerides: 130
VLDL: 26

## 2011-10-07 LAB — URINALYSIS, ROUTINE W REFLEX MICROSCOPIC
Bilirubin Urine: NEGATIVE
Glucose, UA: NEGATIVE
Hgb urine dipstick: NEGATIVE
Ketones, ur: NEGATIVE
Nitrite: NEGATIVE
Protein, ur: NEGATIVE
Specific Gravity, Urine: 1.015
Urobilinogen, UA: 0.2
pH: 6.5

## 2011-10-07 LAB — CK TOTAL AND CKMB (NOT AT ARMC)
CK, MB: 4.5 — ABNORMAL HIGH
CK, MB: 5.1 — ABNORMAL HIGH
CK, MB: 5.9 — ABNORMAL HIGH
Relative Index: 3.4 — ABNORMAL HIGH
Relative Index: 3.8 — ABNORMAL HIGH
Relative Index: 4 — ABNORMAL HIGH
Total CK: 132
Total CK: 136
Total CK: 147

## 2011-10-07 LAB — BASIC METABOLIC PANEL
BUN: 14
BUN: 17
BUN: 20
CO2: 22
CO2: 25
CO2: 25
Calcium: 8.8
Calcium: 9.2
Calcium: 9.5
Chloride: 107
Chloride: 108
Chloride: 99
Creatinine, Ser: 1.05
Creatinine, Ser: 1.05
Creatinine, Ser: 1.14
GFR calc Af Amer: 58 — ABNORMAL LOW
GFR calc Af Amer: >60
GFR calc Af Amer: >60
GFR calc non Af Amer: 48 — ABNORMAL LOW
GFR calc non Af Amer: 52 — ABNORMAL LOW
GFR calc non Af Amer: 52 — ABNORMAL LOW
Glucose, Bld: 160 — ABNORMAL HIGH
Glucose, Bld: 83
Glucose, Bld: 95
Potassium: 3.4 — ABNORMAL LOW
Potassium: 3.6
Potassium: 3.8
Sodium: 133 — ABNORMAL LOW
Sodium: 141
Sodium: 143

## 2011-10-07 LAB — TSH: TSH: 1.194

## 2011-10-07 LAB — TROPONIN I
Troponin I: 0.04
Troponin I: 0.05
Troponin I: 0.05

## 2011-10-07 LAB — URINE MICROSCOPIC-ADD ON

## 2011-10-07 LAB — DIFFERENTIAL
Basophils Absolute: 0.1
Basophils Relative: 1
Eosinophils Absolute: 0.1 — ABNORMAL LOW
Eosinophils Relative: 0
Lymphocytes Relative: 13
Lymphs Abs: 3
Monocytes Absolute: 0.9
Monocytes Relative: 4
Neutro Abs: 18.9 — ABNORMAL HIGH
Neutrophils Relative %: 83 — ABNORMAL HIGH

## 2011-10-07 LAB — PROTIME-INR
INR: 1
Prothrombin Time: 13.5

## 2011-10-07 LAB — HEMOGLOBIN A1C
Hgb A1c MFr Bld: 5.9
Mean Plasma Glucose: 133

## 2011-10-07 LAB — POCT CARDIAC MARKERS
CKMB, poc: 3.3
Myoglobin, poc: 134
Operator id: 196461
Troponin i, poc: <0.05

## 2011-10-07 LAB — APTT: aPTT: 28

## 2011-10-07 LAB — MAGNESIUM: Magnesium: 2.3

## 2011-10-27 NOTE — Discharge Summary (Signed)
NAMESHERETTA, GRUMBINE                  ACCOUNT NO.:  [id number]  MEDICAL RECORD NO.:  [id number]  LOCATION:  3731                         FACILITY:  [facility]  PHYSICIAN:  Ricki Rodriguez, M.D.  DATE OF BIRTH:  February 21, 1941  DATE OF ADMISSION:  09/24/2011 DATE OF DISCHARGE:  09/25/2011                              DISCHARGE SUMMARY   FINAL DIAGNOSES: 1. Chest pain. 2. Diabetes mellitus type 2. 3. Hypertension. 4. Hyperlipidemia. 5. Anxiety.  DISCHARGE MEDICATIONS: 1. Xanax 0.25 mg 1 daily. 2. Metoprolol tartrate 25 mg 1 twice daily. 3. Nitroglycerin 0.4 mg tablet 1 sublingual every 5 minutes x3 as     needed for chest pain. 4. Potassium chloride 10 mEq 1 daily. 5. Aspirin 81 mg daily. 6. Azor 5/40 mg 1/2 tablet daily. 7. Benazepril 20 mg daily. 8. Glipizide XL 2.5 mg daily. 9. Omeprazole 20 mg daily. 10.Simvastatin 20 mg daily. 11.Travatan ophthalmic 0.004% solution 1 drop both eyes at bedtime.  DISCHARGED DIET:  Low-sodium, heart healthy diet.  DISCHARGE ACTIVITY:  The patient is to increase activity gradually as tolerated and to avoid lifting, pulling, pushing, driving, or sexual activity for 2 days.  WOUND CARE INSTRUCTIONS:  The patient is to notify right groin pain, swelling, or discharge.  FOLLOWUP:  By Dr. Algie Coffer in 2 weeks.  The patient is to call [phone] for appointment.  HISTORY:  This is a 70 year old black female who presented with 1-day history of recurrent right-sided neck pain, shoulder pain.  It radiates into the right arm along with EKG showing inferolateral ischemia.  The patient denied any loss of consciousness, speech disturbance, or vision disturbance.  PHYSICAL EXAMINATION:  VITAL SIGNS:  Temperature 98, pulse 77, respirations 16, blood pressure 180/100, height 5 feet 5 inches, and 164 pounds weight. GENERAL:  The patient is short and averagely nourished black female in no acute distress. HEENT:  The patient is normocephalic, atraumatic.  She  wears glasses. Has brown eyes.  Conjunctivae pink. NECK:  No JVD.  Positive faint bruit. LUNGS:  Clear bilaterally. HEART:  Normal S1 and S2. ABDOMEN:  Soft and nontender. EXTREMITIES:  No edema, cyanosis, or clubbing. SKIN:  Warm and dry. NEUROLOGICAL:  The patient moves all 4 extremities.  Cranial nerves grossly intact.  Speech is normal.  LABORATORY DATA:  Normal hemoglobin, hematocrit, WBC count, platelet count.  Normal electrolytes, BUN, and creatinine.  CK slightly high at 268, MB 6.6.  Troponin I normal.  CT of the brain no acute intracranial abnormality.  EKG revealed normal sinus rhythm with lateral ischemia.  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  Cardiac catheterization showed normal coronaries and normal LV systolic function.  HOSPITAL COURSE:  The patient was admitted to telemetry unit. Myocardial infarction appeared unlikely, however, total CK and CK-MB were slightly abnormal.  Hence, the patient underwent cardiac catheterization that showed normal coronaries and hyperdynamic LV systolic function.  She had a CT of the brain that was negative for any acute abnormality.  The patient's medications were adjusted and Xanax 0.25 mg was added and she was discharged home in satisfactory condition with a followup by me in 2 weeks.  Ricki Rodriguez, M.D.     ASK/MEDQ  D:  10/22/2011  T:  10/22/2011  Job:  [id number]  Electronically Signed by Orpah Cobb M.D. on 10/27/2011 08:50:10 AM

## 2011-11-04 ENCOUNTER — Ambulatory Visit
Admission: RE | Admit: 2011-11-04 | Discharge: 2011-11-04 | Disposition: A | Discharge status: Home / Self Care | Payer: Medicare Other | Source: Ambulatory Visit | Attending: Cardiovascular Disease | Admitting: Cardiovascular Disease

## 2011-11-04 DIAGNOSIS — Z1231 Encounter for screening mammogram for malignant neoplasm of breast: Secondary | ICD-10-CM

## 2012-01-20 ENCOUNTER — Encounter: Payer: Self-pay | Admitting: Gastroenterology

## 2012-01-22 ENCOUNTER — Encounter: Payer: Self-pay | Admitting: Gastroenterology

## 2012-01-28 ENCOUNTER — Ambulatory Visit (AMBULATORY_SURGERY_CENTER): Payer: Medicare Other | Admitting: *Deleted

## 2012-01-28 VITALS — Ht 64.0 in | Wt 170.2 lb

## 2012-01-28 DIAGNOSIS — Z1211 Encounter for screening for malignant neoplasm of colon: Secondary | ICD-10-CM

## 2012-01-28 MED ORDER — PEG-KCL-NACL-NASULF-NA ASC-C 100 G PO SOLR: ORAL | Status: DC

## 2012-02-11 ENCOUNTER — Encounter: Payer: Self-pay | Admitting: Gastroenterology

## 2012-02-11 ENCOUNTER — Ambulatory Visit (AMBULATORY_SURGERY_CENTER): Payer: Medicare Other | Admitting: Gastroenterology

## 2012-02-11 VITALS — BP 153/93 | HR 67 | Temp 98.5°F | Resp 18 | Ht 64.0 in | Wt 170.0 lb

## 2012-02-11 DIAGNOSIS — D126 Benign neoplasm of colon, unspecified: Secondary | ICD-10-CM

## 2012-02-11 DIAGNOSIS — Z1211 Encounter for screening for malignant neoplasm of colon: Secondary | ICD-10-CM

## 2012-02-11 LAB — GLUCOSE, CAPILLARY
Glucose-Capillary: 89 mg/dL (ref 70–99)
Glucose-Capillary: 82 mg/dL (ref 70–99)

## 2012-02-11 MED ORDER — SODIUM CHLORIDE 0.9 % IV SOLN: 500.0000 mL | INTRAVENOUS | Status: DC

## 2012-02-11 NOTE — Progress Notes (Signed)
Patient did not experience any of the following events: a burn prior to discharge; a fall within the facility; wrong site/side/patient/procedure/implant event; or a hospital transfer or hospital admission upon discharge from the facility. (G8907) Patient did not have preoperative order for IV antibiotic SSI prophylaxis. (G8918)  

## 2012-02-11 NOTE — Patient Instructions (Signed)
Please refer to your blue and neon green sheets for instructions regarding diet and activity for the rest of today.  You may resume your medications as you would normally take them.   Colon Polyps A polyp is extra tissue that grows inside your body. Colon polyps grow in the large intestine. The large intestine, also called the colon, is part of your digestive system. It is a long, hollow tube at the end of your digestive tract where your body makes and stores stool. Most polyps are not dangerous. They are benign. This means they are not cancerous. But over time, some types of polyps can turn into cancer. Polyps that are smaller than a pea are usually not harmful. But larger polyps could someday become or may already be cancerous. To be safe, doctors remove all polyps and test them.  WHO GETS POLYPS? Anyone can get polyps, but certain people are more likely than others. You may have a greater chance of getting polyps if:  You are over 50.   You have had polyps before.   Someone in your family has had polyps.   Someone in your family has had cancer of the large intestine.   Find out if someone in your family has had polyps. You may also be more likely to get polyps if you:   Eat a lot of fatty foods.   Smoke.   Drink alcohol.   Do not exercise.   Eat too much.  SYMPTOMS  Most small polyps do not cause symptoms. People often do not know they have one until their caregiver finds it during a regular checkup or while testing them for something else. Some people do have symptoms like these:  Bleeding from the anus. You might notice blood on your underwear or on toilet paper after you have had a bowel movement.   Constipation or diarrhea that lasts more than a week.   Blood in the stool. Blood can make stool look black or it can show up as red streaks in the stool.  If you have any of these symptoms, see your caregiver. HOW DOES THE DOCTOR TEST FOR POLYPS? The doctor can use four  tests to check for polyps:  Digital rectal exam. The caregiver wears gloves and checks your rectum (the last part of the large intestine) to see if it feels normal. This test would find polyps only in the rectum. Your caregiver may need to do one of the other tests listed below to find polyps higher up in the intestine.   Barium enema. The caregiver puts a liquid called barium into your rectum before taking x-rays of your large intestine. Barium makes your intestine look white in the pictures. Polyps are dark, so they are easy to see.   Sigmoidoscopy. With this test, the caregiver can see inside your large intestine. A thin flexible tube is placed into your rectum. The device is called a sigmoidoscope, which has a light and a tiny video camera in it. The caregiver uses the sigmoidoscope to look at the last third of your large intestine.   Colonoscopy. This test is like sigmoidoscopy, but the caregiver looks at all of the large intestine. It usually requires sedation. This is the most common method for finding and removing polyps.  TREATMENT   The caregiver will remove the polyp during sigmoidoscopy or colonoscopy. The polyp is then tested for cancer.   If you have had polyps, your caregiver may want you to get tested regularly in the future.    PREVENTION  There is not one sure way to prevent polyps. You might be able to lower your risk of getting them if you:  Eat more fruits and vegetables and less fatty food.   Do not smoke.   Avoid alcohol.   Exercise every day.   Lose weight if you are overweight.   Eating more calcium and folate can also lower your risk of getting polyps. Some foods that are rich in calcium are milk, cheese, and broccoli. Some foods that are rich in folate are chickpeas, kidney beans, and spinach.   Aspirin might help prevent polyps. Studies are under way.  Document Released: 09/10/2004 Document Revised: 08/27/2011 Document Reviewed: 02/16/2008 Exit[employer] Patient  Information 2012 [employer]. 

## 2012-02-11 NOTE — Op Note (Signed)
[facility] [address] [place], [place]  [address]  COLONOSCOPY PROCEDURE REPORT  PATIENT:  Heidi, Wolf  MR#:  [id number] BIRTHDATE:  [birth year], 70 yrs. old  GENDER:  female ENDOSCOPIST:  Barbette Hair. Arlyce Dice, MD REF. BY:  Orpah Cobb, M.D. PROCEDURE DATE:  02/11/2012 PROCEDURE:  Colonoscopy with snare polypectomy ASA CLASS:  Class II INDICATIONS:  Routine Risk Screening MEDICATIONS:   MAC sedation, administered by CRNA propofol 200mg IV  DESCRIPTION OF PROCEDURE:   After the risks benefits and alternatives of the procedure were thoroughly explained, informed consent was obtained.  Digital rectal exam was performed and revealed no abnormalities.   The LB CF-H180AL [id number] endoscope was introduced through the anus and advanced to the cecum, which was identified by both the appendix and ileocecal valve, without limitations.  The quality of the prep was excellent, using MoviPrep.  The instrument was then slowly withdrawn as the colon was fully examined. <<PROCEDUREIMAGES>>  FINDINGS:  A sessile polyp was found in the cecum. It was 3 - 4 mm in size. Flat, sessile polyp Polyp was snared without cautery. Retrieval was successful (see image3). snare polyp  This was otherwise a normal examination of the colon (see image1, image5, and image6).   Retroflexed views in the rectum revealed no abnormalities.    The time to cecum =  1) 3.0  minutes. The scope was then withdrawn in  1) 9.75  minutes from the cecum and the procedure completed. COMPLICATIONS:  None ENDOSCOPIC IMPRESSION: 1) 3 - 4 mm sessile polyp in the cecum 2) Otherwise normal examination RECOMMENDATIONS: 1) If the polyp(s) removed today are proven to be adenomatous (pre-cancerous) polyps, you will need a repeat colonoscopy in 5 years. Otherwise you should continue to follow colorectal cancer screening guidelines for "routine risk" patients with colonoscopy in 10 years. 2) Call office for follow-up appointment  for evaluation of hemorrhoids REPEAT EXAM:   You will receive a letter from Dr. Arlyce Dice in 1-2 weeks, after reviewing the final pathology, with followup recommendations.  ______________________________ Barbette Hair Arlyce Dice, MD  CC:  n. eSIGNED:   Barbette Hair. Suraiya Dickerson at 02/11/2012 10:44 AM  Wyatt Mage, [phone]

## 2012-02-12 ENCOUNTER — Telehealth: Payer: Self-pay

## 2012-02-12 NOTE — Telephone Encounter (Signed)
  Follow up Call-  Call back number 02/11/2012  Post procedure Call Back phone  # [phone]  Permission to leave phone message Yes     Patient questions:  Do you have a fever, pain , or abdominal swelling? no Pain Score  0 *  Have you tolerated food without any problems? yes  Have you been able to return to your normal activities? yes  Do you have any questions about your discharge instructions: Diet   no Medications  no Follow up visit  no  Do you have questions or concerns about your Care? no  Actions: * If pain score is 4 or above: No action needed, pain <4.

## 2012-02-18 ENCOUNTER — Encounter: Payer: Self-pay | Admitting: Gastroenterology

## 2012-03-03 ENCOUNTER — Encounter: Payer: Self-pay | Admitting: Gastroenterology

## 2012-03-03 ENCOUNTER — Ambulatory Visit (INDEPENDENT_AMBULATORY_CARE_PROVIDER_SITE_OTHER): Payer: Medicare Other | Admitting: Gastroenterology

## 2012-03-03 DIAGNOSIS — K648 Other hemorrhoids: Secondary | ICD-10-CM | POA: Insufficient documentation

## 2012-03-03 DIAGNOSIS — Z860100 Personal history of colon polyps, unspecified: Secondary | ICD-10-CM

## 2012-03-03 DIAGNOSIS — R1033 Periumbilical pain: Secondary | ICD-10-CM | POA: Insufficient documentation

## 2012-03-03 HISTORY — DX: Personal history of colonic polyps: Z86.010

## 2012-03-03 HISTORY — DX: Other hemorrhoids: K64.8

## 2012-03-03 HISTORY — DX: Personal history of colon polyps, unspecified: Z86.0100

## 2012-03-03 MED ORDER — HYOSCYAMINE SULFATE ER 0.375 MG PO TBCR: EXTENDED_RELEASE_TABLET | ORAL | Status: DC

## 2012-03-03 MED ORDER — HYDROCORTISONE ACETATE 25 MG RE SUPP: 25.0000 mg | Freq: Two times a day (BID) | RECTAL | Status: AC

## 2012-03-03 NOTE — Assessment & Plan Note (Signed)
Persistent periumbilical pain may be due to ulcer or nonulcer dyspepsia. Pain from pancreatic disease is unlikely.  Recommendations #1 upper endoscopy #2 trial of hyomax

## 2012-03-03 NOTE — Patient Instructions (Signed)
Upper GI Endoscopy Upper GI endoscopy means using a flexible scope to look at the esophagus, stomach, and upper small bowel. This is done to make a diagnosis in people with heartburn, abdominal pain, or abnormal bleeding. Sometimes an endoscope is needed to remove foreign bodies or food that become stuck in the esophagus; it can also be used to take biopsy samples. For the best results, do not eat or drink for 8 hours before having your upper endoscopy.  To perform the endoscopy, you will probably be sedated and your throat will be numbed with a special spray. The endoscope is then slowly passed down your throat (this will not interfere with your breathing). An endoscopy exam takes 15 to 30 minutes to complete and there is no real pain. Patients rarely remember much about the procedure. The results of the test may take several days if a biopsy or other test is taken.  You may have a sore throat after an endoscopy exam. Serious complications are very rare. Stick to liquids and soft foods until your pain is better. Do not drive a car or operate any dangerous equipment for at least 24 hours after being sedated. SEEK IMMEDIATE MEDICAL CARE IF:   You have severe throat pain.   You have shortness of breath.   You have bleeding problems.   You have a fever.   You have difficulty recovering from your sedation.  Document Released: 01/22/2005 Document Revised: 12/04/2011 Document Reviewed: 12/17/2008 ExitCare Patient Information 2012 [employer]. 

## 2012-03-03 NOTE — Progress Notes (Signed)
History of Present Illness: Heidi Wolf is a pleasant 71 year old American female referred at the request of Dr. Algie Coffer for evaluation of abdominal pain.  She complains of burning-like discomfort in her periumbilical area. This occurs postprandially but may occur spontaneously as well. She takes Prilosec daily. She is on no gastric irritants including nonsteroidals. She  denies pyrosis nausea or vomiting.  Recent screening colonoscopy demonstrated a small adenomatous polyp.  She also complains of occasional rectal discomfort with a bowel movement and minimal bleeding onto the toilet tissue which he attributes to hemorrhoids.    Past Medical History  Diagnosis Date  . Diabetes mellitus   . Hypertension   . Glaucoma   . Hyperlipidemia   . Arthritis   . Cataracts, both eyes   . Colon polyps     Hyperplastic 2003  . Internal hemorrhoid   . External hemorrhoid    Past Surgical History  Procedure Date  . Tubal ligation 1971  . Cholecystectomy   . Abdominal hysterectomy    family history includes Arthritis in her mother; Breast cancer in an unspecified family member; Diabetes in her maternal grandmother and mother; Heart disease in her brother, mother, and sister; and Stroke in her brother, daughter, father, and maternal grandmother.  There is no history of Colon cancer and Stomach cancer. Current Outpatient Prescriptions  Medication Sig Dispense Refill  . ALPRAZolam (XANAX) 0.25 MG tablet Take 0.25 mg by mouth daily as needed.      Marland Kitchen aspirin 81 MG tablet Take 160 mg by mouth daily.      . benazepril (LOTENSIN) 20 MG tablet Take 20 mg by mouth daily.      Marland Kitchen glipiZIDE (GLUCOTROL XL) 2.5 MG 24 hr tablet Take 2.5 mg by mouth daily.      Marland Kitchen loratadine (CLARITIN) 10 MG tablet Take 10 mg by mouth daily.      . metoprolol tartrate (LOPRESSOR) 25 MG tablet Take 25 mg by mouth 2 (two) times daily.      . nitroGLYCERIN (NITROSTAT) 0.4 MG SL tablet Place 0.4 mg under the tongue every 5 (five) minutes  as needed.      Marland Kitchen omeprazole (PRILOSEC) 20 MG capsule Take 20 mg by mouth daily.      . simvastatin (ZOCOR) 20 MG tablet Take 20 mg by mouth at bedtime.      . travoprost, benzalkonium, (TRAVATAN) 0.004 % ophthalmic solution Place 1 drop into both eyes at bedtime.       Allergies as of 03/03/2012  . (No Known Allergies)    reports that she has never smoked. She has never used smokeless tobacco. She reports that she does not drink alcohol or use illicit drugs.     Review of Systems: She complains of joint pains. Pertinent positive and negative review of systems were noted in the above HPI section. All other review of systems were otherwise negative.  Vital signs were reviewed in today's medical record Physical Exam: General: Well developed , well nourished, no acute distress Head: Normocephalic and atraumatic Eyes:  sclerae anicteric, EOMI Ears: Normal auditory acuity Mouth: No deformity or lesions Neck: Supple, no masses or thyromegaly Lungs: Clear throughout to auscultation Heart: Regular rate and rhythm; no murmurs, rubs or bruits Abdomen: Soft, non tender and non distended. No masses, hepatosplenomegaly or hernias noted. Normal Bowel sounds Rectal:deferred Musculoskeletal: Symmetrical with no gross deformities  Skin: No lesions on visible extremities Pulses:  Normal pulses noted Extremities: No clubbing, cyanosis, edema or deformities noted Neurological: Alert  oriented x 4, grossly nonfocal Cervical Nodes:  No significant cervical adenopathy Inguinal Nodes: No significant inguinal adenopathy Psychological:  Alert and cooperative. Normal mood and affect

## 2012-03-03 NOTE — Assessment & Plan Note (Signed)
Plan Anusol HC suppositories 

## 2012-03-03 NOTE — Assessment & Plan Note (Signed)
Plan followup colonoscopy 2018 

## 2012-03-05 ENCOUNTER — Telehealth: Payer: Self-pay | Admitting: Gastroenterology

## 2012-03-05 NOTE — Telephone Encounter (Signed)
Pt notified to use the prep h suppository.  What can she take instead of Hyoscyamine?

## 2012-03-05 NOTE — Telephone Encounter (Signed)
Try Preparation H suppository 

## 2012-03-05 NOTE — Telephone Encounter (Signed)
Try Preparation H suppository

## 2012-03-05 NOTE — Telephone Encounter (Signed)
Dr Arlyce Dice, Patient states this medication is to expensive.Marland KitchenMarland KitchenWhat do you want to prescribe

## 2012-03-08 ENCOUNTER — Ambulatory Visit (AMBULATORY_SURGERY_CENTER): Payer: Medicare Other | Admitting: Gastroenterology

## 2012-03-08 ENCOUNTER — Encounter: Payer: Self-pay | Admitting: Gastroenterology

## 2012-03-08 VITALS — BP 151/81 | HR 66 | Temp 96.8°F | Resp 18 | Ht 64.0 in | Wt 166.0 lb

## 2012-03-08 DIAGNOSIS — R1033 Periumbilical pain: Secondary | ICD-10-CM

## 2012-03-08 LAB — GLUCOSE, CAPILLARY: Glucose-Capillary: 96 mg/dL (ref 70–99)

## 2012-03-08 MED ORDER — SODIUM CHLORIDE 0.9 % IV SOLN: 500.0000 mL | INTRAVENOUS | Status: DC

## 2012-03-08 MED ORDER — HYOSCYAMINE SULFATE ER 0.375 MG PO TB12: ORAL_TABLET | ORAL | Status: DC

## 2012-03-08 NOTE — Patient Instructions (Signed)
YOU HAD AN ENDOSCOPIC PROCEDURE TODAY AT THE [facility]: Refer to the procedure report that was given to you for any specific questions about what was found during the examination.  If the procedure report does not answer your questions, please call your gastroenterologist to clarify.  If you requested that your care partner not be given the details of your procedure findings, then the procedure report has been included in a sealed envelope for you to review at your convenience later.  YOU SHOULD EXPECT: Some feelings of bloating in the abdomen. Passage of more gas than usual.  Walking can help get rid of the air that was put into your GI tract during the procedure and reduce the bloating. If you had a lower endoscopy (such as a colonoscopy or flexible sigmoidoscopy) you may notice spotting of blood in your stool or on the toilet paper. If you underwent a bowel prep for your procedure, then you may not have a normal bowel movement for a few days.  DIET: Your first meal following the procedure should be a light meal and then it is ok to progress to your normal diet.  A half-sandwich or bowl of soup is an example of a good first meal.  Heavy or fried foods are harder to digest and may make you feel nauseous or bloated.  Likewise meals heavy in dairy and vegetables can cause extra gas to form and this can also increase the bloating.  Drink plenty of fluids but you should avoid alcoholic beverages for 24 hours.  ACTIVITY: Your care partner should take you home directly after the procedure.  You should plan to take it easy, moving slowly for the rest of the day.  You can resume normal activity the day after the procedure however you should NOT DRIVE or use heavy machinery for 24 hours (because of the sedation medicines used during the test).    SYMPTOMS TO REPORT IMMEDIATELY: A gastroenterologist can be reached at any hour.  During normal business hours, 8:30 AM to 5:00 PM Monday through Friday,  call [phone].  After hours and on weekends, please call the GI answering service at [phone] who will take a message and have the physician on call contact you.  Following upper endoscopy (EGD)  Vomiting of blood or coffee ground material  New chest pain or pain under the shoulder blades  Painful or persistently difficult swallowing  New shortness of breath  Fever of 100F or higher  Black, tarry-looking stools  FOLLOW UP: If any biopsies were taken you will be contacted by phone or by letter within the next 1-3 weeks.  Call your gastroenterologist if you have not heard about the biopsies in 3 weeks.  Our staff will call the home number listed on your records the next business day following your procedure to check on you and address any questions or concerns that you may have at that time regarding the information given to you following your procedure. This is a courtesy call and so if there is no answer at the home number and we have not heard from you through the emergency physician on call, we will assume that you have returned to your regular daily activities without incident.  SIGNATURES/CONFIDENTIALITY: You and/or your care partner have signed paperwork which will be entered into your electronic medical record.  These signatures attest to the fact that that the information above on your After Visit Summary has been reviewed and is understood.  Full responsibility of   the confidentiality of this discharge information lies with you and/or your care-partner. 

## 2012-03-08 NOTE — Op Note (Signed)
[facility] [address] [place], [place]  [address]  ENDOSCOPY PROCEDURE REPORT  PATIENT:  Heidi Wolf, Heidi Wolf  MR#:  [id number] BIRTHDATE:  [birth year], 70 yrs. old  GENDER:  female  ENDOSCOPIST:  Barbette Hair. Arlyce Dice, MD Referred by:  Orpah Cobb, M.D.  PROCEDURE DATE:  03/08/2012 PROCEDURE:  EGD, diagnostic 43235 ASA CLASS:  Class II INDICATIONS:  abdominal pain  MEDICATIONS:   MAC sedation, administered by CRNA propofol 50mg IV, 0.6cc simethancone 0.6 cc PO TOPICAL ANESTHETIC:  DESCRIPTION OF PROCEDURE:   After the risks and benefits of the procedure were explained, informed consent was obtained.  The LB GIF-H180 [id number] endoscope was introduced through the mouth and advanced to the third portion of the duodenum.  The instrument was slowly withdrawn as the mucosa was fully examined. <<PROCEDUREIMAGES>>  The upper, middle, and distal third of the esophagus were carefully inspected and no abnormalities were noted. The z-line was well seen at the GEJ. The endoscope was pushed into the fundus which was normal including a retroflexed view. The antrum,gastric body, first and second part of the duodenum were unremarkable (see image3, image4, and image5).    Retroflexed views revealed no abnormalities.    The scope was then withdrawn from the patient and the procedure completed.  COMPLICATIONS:  None  ENDOSCOPIC IMPRESSION: 1) Normal EGD RECOMMENDATIONS: 1) Call office next 2-3 days to schedule an office appointment for 3-4 weeks 2) trial of hyoscyamine  ______________________________ Barbette Hair. Arlyce Dice, MD  CC:  n. eSIGNED:   Barbette Hair. Treyvion Durkee at 03/08/2012 02:41 PM  Wyatt Mage, [phone]

## 2012-03-08 NOTE — Progress Notes (Signed)
Patient did not experience any of the following events: a burn prior to discharge; a fall within the facility; wrong site/side/patient/procedure/implant event; or a hospital transfer or hospital admission upon discharge from the facility. (G8907) Patient did not have preoperative order for IV antibiotic SSI prophylaxis. (G8918)  

## 2012-03-09 ENCOUNTER — Telehealth: Payer: Self-pay | Admitting: *Deleted

## 2012-03-09 ENCOUNTER — Other Ambulatory Visit: Payer: Self-pay | Admitting: *Deleted

## 2012-03-09 ENCOUNTER — Telehealth: Payer: Self-pay | Admitting: Gastroenterology

## 2012-03-09 DIAGNOSIS — R1033 Periumbilical pain: Secondary | ICD-10-CM

## 2012-03-09 LAB — GLUCOSE, CAPILLARY: Glucose-Capillary: 83 mg/dL (ref 70–99)

## 2012-03-09 MED ORDER — HYOSCYAMINE SULFATE ER 0.375 MG PO TB12: ORAL_TABLET | ORAL | Status: DC

## 2012-03-09 NOTE — Progress Notes (Signed)
SPOKE WITH PATIENT ON PHONE. PHARMACY TOLD HER THEY DID NOT RECEIVE PRESCRIPTION FOR LEVBID. RESENT RX.

## 2012-03-09 NOTE — Telephone Encounter (Signed)
  Follow up Call-  Call back number 03/08/2012 02/11/2012  Post procedure Call Back phone  # [phone]  Permission to leave phone message Yes Yes     Patient questions:  Do you have a fever, pain , or abdominal swelling? no Pain Score  0 *  Have you tolerated food without any problems? yes  Have you been able to return to your normal activities? yes  Do you have any questions about your discharge instructions: Diet   no Medications  no Follow up visit  no  Do you have questions or concerns about your Care? no  Actions: * If pain score is 4 or above: No action needed, pain <4.

## 2012-03-12 ENCOUNTER — Telehealth: Payer: Self-pay | Admitting: *Deleted

## 2012-03-12 NOTE — Telephone Encounter (Signed)
It was brought to my attention that the pt had trouble with getting her prescription for Hyocsyomine at her pharmacy.  Called pt to make sure that she had received it.  She states that she picked it up and has been taking it since Wed and is doing well.  Has no questions or problems at this time.

## 2012-03-16 NOTE — Telephone Encounter (Signed)
NURSE OPENED ANOTHER PHONE NOTE... MESSAGE ANSWERED

## 2012-04-12 ENCOUNTER — Ambulatory Visit (INDEPENDENT_AMBULATORY_CARE_PROVIDER_SITE_OTHER): Payer: Medicare Other | Admitting: Gastroenterology

## 2012-04-12 ENCOUNTER — Other Ambulatory Visit (INDEPENDENT_AMBULATORY_CARE_PROVIDER_SITE_OTHER): Payer: Medicare Other

## 2012-04-12 ENCOUNTER — Encounter: Payer: Self-pay | Admitting: Gastroenterology

## 2012-04-12 DIAGNOSIS — R1033 Periumbilical pain: Secondary | ICD-10-CM

## 2012-04-12 DIAGNOSIS — R109 Unspecified abdominal pain: Secondary | ICD-10-CM

## 2012-04-12 LAB — BASIC METABOLIC PANEL
BUN: 15 mg/dL (ref 6–23)
CO2: 28 mEq/L (ref 19–32)
Calcium: 9.8 mg/dL (ref 8.4–10.5)
Chloride: 104 mEq/L (ref 96–112)
Creatinine, Ser: 1 mg/dL (ref 0.4–1.2)
GFR: 73.7 mL/min (ref >60.00)
Glucose, Bld: 113 mg/dL — ABNORMAL HIGH (ref 70–99)
Potassium: 4.2 mEq/L (ref 3.5–5.1)
Sodium: 141 mEq/L (ref 135–145)

## 2012-04-12 MED ORDER — HYOSCYAMINE SULFATE 0.125 MG SL SUBL: 0.2500 mg | SUBLINGUAL_TABLET | SUBLINGUAL | Status: DC | PRN

## 2012-04-12 NOTE — Patient Instructions (Signed)
You will go to the basement today for labs   You have been scheduled for a CT scan of the abdomen and pelvis at [facility] ([address]---this is in the same building as Architectural technologist).   You are scheduled on 04/13/2012 at 10am. You should arrive 15 minutes prior to your appointment time for registration. Please follow the written instructions below on the day of your exam:  WARNING: IF YOU ARE ALLERGIC TO IODINE/X-RAY DYE, PLEASE NOTIFY RADIOLOGY IMMEDIATELY AT [phone]! YOU WILL BE GIVEN A 13 HOUR PREMEDICATION PREP.  1) Do not eat or drink anything after 6am (4 hours prior to your test) 2) You have been given 2 bottles of oral contrast to drink. The solution may taste               better if refrigerated, but do NOT add ice or any other liquid to this solution. Shake             well before drinking.    Drink 1 bottle of contrast @ 8am (2 hours prior to your exam)  Drink 1 bottle of contrast @ 9am (1 hour prior to your exam)  You may take any medications as prescribed with a small amount of water except for the following: Metformin, Glucophage, Glucovance, Avandamet, Riomet, Fortamet, Actoplus Met, Janumet, Glumetza or Metaglip. The above medications must be held the day of the exam AND 48 hours after the exam.  The purpose of you drinking the oral contrast is to aid in the visualization of your intestinal tract. The contrast solution may cause some diarrhea. Before your exam is started, you will be given a small amount of fluid to drink. Depending on your individual set of symptoms, you may also receive an intravenous injection of x-ray contrast/dye. Plan on being at [facility] for 30 minutes or long, depending on the type of exam you are having performed.  If you have any questions regarding your exam or if you need to reschedule, you may call the CT department at [phone] between the hours of 8:00 am and 5:00 pm,  Monday-Friday.  ________________________________________________________________________

## 2012-04-12 NOTE — Assessment & Plan Note (Addendum)
She has persistent postprandial abdominal pain that responds to hyomax. EGD was negative. Symptoms are probably secondary to dyspepsia. Other underlying GI pathology is less likely but should be ruled out. She's having side effects to long-acting hyoscyamine.  Recommendations #1 substitute sublingual hyomax #2 CT of the abdomen and pelvis

## 2012-04-12 NOTE — Progress Notes (Signed)
History of Present Illness:  Mrs. Taflinger continues to complain of postprandial burning periumbilical discomfort. Recent upper endoscopy was negative. Symptoms are improved with hyomax but she complains of dizziness when she takes this.    Review of Systems: Pertinent positive and negative review of systems were noted in the above HPI section. All other review of systems were otherwise negative.    Current Medications, Allergies, Past Medical History, Past Surgical History, Family History and Social History were reviewed in [employer] [facility] electronic medical record  Vital signs were reviewed in today's medical record. Physical Exam: General: Well developed , well nourished, no acute distress

## 2012-04-13 ENCOUNTER — Ambulatory Visit (INDEPENDENT_AMBULATORY_CARE_PROVIDER_SITE_OTHER)
Admission: RE | Admit: 2012-04-13 | Discharge: 2012-04-13 | Disposition: A | Discharge status: Home / Self Care | Payer: Medicare Other | Source: Ambulatory Visit | Attending: Gastroenterology | Admitting: Gastroenterology

## 2012-04-13 DIAGNOSIS — R109 Unspecified abdominal pain: Secondary | ICD-10-CM

## 2012-04-13 MED ORDER — IOHEXOL 300 MG/ML  SOLN
100.0000 mL | Freq: Once | INTRAMUSCULAR | Status: AC | PRN
  Administered 2012-04-13: 100 mL via INTRAVENOUS

## 2012-04-13 NOTE — Progress Notes (Signed)
Quick Note:  Please inform the patient that CT scan was normal and to continue current plan of action ______ 

## 2012-10-05 ENCOUNTER — Other Ambulatory Visit: Payer: Self-pay | Admitting: Cardiovascular Disease

## 2012-10-05 DIAGNOSIS — Z1231 Encounter for screening mammogram for malignant neoplasm of breast: Secondary | ICD-10-CM

## 2012-11-09 ENCOUNTER — Ambulatory Visit
Admission: RE | Admit: 2012-11-09 | Discharge: 2012-11-09 | Disposition: A | Discharge status: Home / Self Care | Payer: Medicare Other | Source: Ambulatory Visit | Attending: Cardiovascular Disease | Admitting: Cardiovascular Disease

## 2012-11-09 DIAGNOSIS — Z1231 Encounter for screening mammogram for malignant neoplasm of breast: Secondary | ICD-10-CM

## 2013-10-04 ENCOUNTER — Other Ambulatory Visit: Payer: Self-pay

## 2013-10-04 DIAGNOSIS — Z1231 Encounter for screening mammogram for malignant neoplasm of breast: Secondary | ICD-10-CM

## 2013-11-10 ENCOUNTER — Ambulatory Visit
Admission: RE | Admit: 2013-11-10 | Discharge: 2013-11-10 | Disposition: A | Discharge status: Home / Self Care | Payer: Medicare Other | Source: Ambulatory Visit

## 2013-11-10 DIAGNOSIS — Z1231 Encounter for screening mammogram for malignant neoplasm of breast: Secondary | ICD-10-CM

## 2014-01-17 ENCOUNTER — Observation Stay (HOSPITAL_COMMUNITY): Payer: Medicare Other

## 2014-01-17 ENCOUNTER — Encounter (HOSPITAL_COMMUNITY): Payer: Self-pay | Admitting: Emergency Medicine

## 2014-01-17 ENCOUNTER — Observation Stay (HOSPITAL_COMMUNITY)
Admission: EM | Admit: 2014-01-17 | Discharge: 2014-01-17 | Disposition: A | Discharge status: Home / Self Care | Payer: Medicare Other | Attending: Cardiovascular Disease | Admitting: Cardiovascular Disease

## 2014-01-17 ENCOUNTER — Emergency Department (HOSPITAL_COMMUNITY): Payer: Medicare Other

## 2014-01-17 DIAGNOSIS — H409 Unspecified glaucoma: Secondary | ICD-10-CM | POA: Insufficient documentation

## 2014-01-17 DIAGNOSIS — R110 Nausea: Secondary | ICD-10-CM | POA: Insufficient documentation

## 2014-01-17 DIAGNOSIS — Z860100 Personal history of colon polyps, unspecified: Secondary | ICD-10-CM | POA: Insufficient documentation

## 2014-01-17 DIAGNOSIS — I10 Essential (primary) hypertension: Secondary | ICD-10-CM

## 2014-01-17 DIAGNOSIS — R52 Pain, unspecified: Secondary | ICD-10-CM | POA: Insufficient documentation

## 2014-01-17 DIAGNOSIS — R079 Chest pain, unspecified: Secondary | ICD-10-CM | POA: Diagnosis present

## 2014-01-17 DIAGNOSIS — D649 Anemia, unspecified: Secondary | ICD-10-CM | POA: Insufficient documentation

## 2014-01-17 DIAGNOSIS — R9431 Abnormal electrocardiogram [ECG] [EKG]: Secondary | ICD-10-CM | POA: Insufficient documentation

## 2014-01-17 DIAGNOSIS — Z79899 Other long term (current) drug therapy: Secondary | ICD-10-CM | POA: Insufficient documentation

## 2014-01-17 DIAGNOSIS — Z7982 Long term (current) use of aspirin: Secondary | ICD-10-CM | POA: Insufficient documentation

## 2014-01-17 DIAGNOSIS — H269 Unspecified cataract: Secondary | ICD-10-CM | POA: Insufficient documentation

## 2014-01-17 DIAGNOSIS — R0789 Other chest pain: Principal | ICD-10-CM | POA: Insufficient documentation

## 2014-01-17 DIAGNOSIS — E119 Type 2 diabetes mellitus without complications: Secondary | ICD-10-CM

## 2014-01-17 DIAGNOSIS — R011 Cardiac murmur, unspecified: Secondary | ICD-10-CM | POA: Insufficient documentation

## 2014-01-17 DIAGNOSIS — R002 Palpitations: Secondary | ICD-10-CM | POA: Insufficient documentation

## 2014-01-17 DIAGNOSIS — E876 Hypokalemia: Secondary | ICD-10-CM | POA: Insufficient documentation

## 2014-01-17 DIAGNOSIS — M129 Arthropathy, unspecified: Secondary | ICD-10-CM | POA: Insufficient documentation

## 2014-01-17 DIAGNOSIS — E785 Hyperlipidemia, unspecified: Secondary | ICD-10-CM | POA: Insufficient documentation

## 2014-01-17 DIAGNOSIS — F411 Generalized anxiety disorder: Secondary | ICD-10-CM | POA: Insufficient documentation

## 2014-01-17 LAB — CBC
HCT: 38.2 % (ref 36.0–46.0)
HCT: 40.1 % (ref 36.0–46.0)
Hemoglobin: 12.9 g/dL (ref 12.0–15.0)
Hemoglobin: 13.4 g/dL (ref 12.0–15.0)
MCH: 27.2 pg (ref 26.0–34.0)
MCH: 27.6 pg (ref 26.0–34.0)
MCHC: 33.4 g/dL (ref 30.0–36.0)
MCHC: 33.8 g/dL (ref 30.0–36.0)
MCV: 81.3 fL (ref 78.0–100.0)
MCV: 81.6 fL (ref 78.0–100.0)
Platelets: 262 10*3/uL (ref 150–400)
Platelets: 264 10*3/uL (ref 150–400)
RBC: 4.68 MIL/uL (ref 3.87–5.11)
RBC: 4.93 MIL/uL (ref 3.87–5.11)
RDW: 13.8 % (ref 11.5–15.5)
RDW: 13.9 % (ref 11.5–15.5)
WBC: 8.2 10*3/uL (ref 4.0–10.5)
WBC: 9.3 10*3/uL (ref 4.0–10.5)

## 2014-01-17 LAB — COMPREHENSIVE METABOLIC PANEL
ALT: 16 U/L (ref 0–35)
AST: 25 U/L (ref 0–37)
Albumin: 3.9 g/dL (ref 3.5–5.2)
Alkaline Phosphatase: 64 U/L (ref 39–117)
BUN: 14 mg/dL (ref 6–23)
CO2: 25 mEq/L (ref 19–32)
Calcium: 9.7 mg/dL (ref 8.4–10.5)
Chloride: 101 mEq/L (ref 96–112)
Creatinine, Ser: 1.12 mg/dL — ABNORMAL HIGH (ref 0.50–1.10)
GFR calc Af Amer: 55 mL/min — ABNORMAL LOW (ref >90)
GFR calc non Af Amer: 48 mL/min — ABNORMAL LOW (ref >90)
Glucose, Bld: 149 mg/dL — ABNORMAL HIGH (ref 70–99)
Potassium: 3.5 mEq/L — ABNORMAL LOW (ref 3.7–5.3)
Sodium: 140 mEq/L (ref 137–147)
Total Bilirubin: 0.5 mg/dL (ref 0.3–1.2)
Total Protein: 7.9 g/dL (ref 6.0–8.3)

## 2014-01-17 LAB — GLUCOSE, CAPILLARY
Glucose-Capillary: 119 mg/dL — ABNORMAL HIGH (ref 70–99)
Glucose-Capillary: 122 mg/dL — ABNORMAL HIGH (ref 70–99)
Glucose-Capillary: 92 mg/dL (ref 70–99)

## 2014-01-17 LAB — LIPID PANEL
Cholesterol: 187 mg/dL (ref 0–200)
HDL: 40 mg/dL (ref >39)
LDL Cholesterol: 134 mg/dL — ABNORMAL HIGH (ref 0–99)
Total CHOL/HDL Ratio: 4.7 RATIO
Triglycerides: 67 mg/dL (ref <150)
VLDL: 13 mg/dL (ref 0–40)

## 2014-01-17 LAB — TROPONIN I
Troponin I: <0.3 ng/mL (ref <0.30)
Troponin I: <0.3 ng/mL (ref <0.30)
Troponin I: <0.3 ng/mL (ref <0.30)

## 2014-01-17 LAB — BASIC METABOLIC PANEL
BUN: 11 mg/dL (ref 6–23)
CO2: 24 mEq/L (ref 19–32)
Calcium: 9.4 mg/dL (ref 8.4–10.5)
Chloride: 103 mEq/L (ref 96–112)
Creatinine, Ser: 0.95 mg/dL (ref 0.50–1.10)
GFR calc Af Amer: 68 mL/min — ABNORMAL LOW (ref >90)
GFR calc non Af Amer: 58 mL/min — ABNORMAL LOW (ref >90)
Glucose, Bld: 123 mg/dL — ABNORMAL HIGH (ref 70–99)
Potassium: 3.7 mEq/L (ref 3.7–5.3)
Sodium: 142 mEq/L (ref 137–147)

## 2014-01-17 LAB — POCT I-STAT TROPONIN I
Troponin i, poc: 0 ng/mL (ref 0.00–0.08)
Troponin i, poc: 0 ng/mL (ref 0.00–0.08)

## 2014-01-17 LAB — PROTIME-INR
INR: 1 (ref 0.00–1.49)
Prothrombin Time: 13 seconds (ref 11.6–15.2)

## 2014-01-17 LAB — HEPARIN LEVEL (UNFRACTIONATED): Heparin Unfractionated: 0.32 IU/mL (ref 0.30–0.70)

## 2014-01-17 MED ORDER — HEPARIN (PORCINE) IN NACL 100-0.45 UNIT/ML-% IJ SOLN
1000.0000 [IU]/h | INTRAMUSCULAR | Status: DC
  Administered 2014-01-17: 1000 [IU]/h via INTRAVENOUS
  Filled 2014-01-17 (×2): qty 250

## 2014-01-17 MED ORDER — ALPRAZOLAM 0.25 MG PO TABS: 0.2500 mg | ORAL_TABLET | Freq: Every day | ORAL | Status: DC | PRN

## 2014-01-17 MED ORDER — LORATADINE 10 MG PO TABS
10.0000 mg | ORAL_TABLET | Freq: Every day | ORAL | Status: DC | PRN
  Filled 2014-01-17: qty 1

## 2014-01-17 MED ORDER — REGADENOSON 0.4 MG/5ML IV SOLN
INTRAVENOUS | Status: AC
  Administered 2014-01-17: 0.4 mg via INTRAVENOUS
  Filled 2014-01-17: qty 5

## 2014-01-17 MED ORDER — AMLODIPINE BESYLATE 5 MG PO TABS: 5.0000 mg | ORAL_TABLET | Freq: Every day | ORAL | Status: DC

## 2014-01-17 MED ORDER — ONDANSETRON HCL 4 MG/2ML IJ SOLN: 4.0000 mg | Freq: Four times a day (QID) | INTRAMUSCULAR | Status: DC | PRN

## 2014-01-17 MED ORDER — METOPROLOL TARTRATE 25 MG PO TABS
25.0000 mg | ORAL_TABLET | Freq: Every day | ORAL | Status: DC
  Administered 2014-01-17: 25 mg via ORAL
  Filled 2014-01-17: qty 1

## 2014-01-17 MED ORDER — ACETAMINOPHEN 325 MG PO TABS
650.0000 mg | ORAL_TABLET | ORAL | Status: DC | PRN
Start: 2014-01-17 — End: 2014-01-17

## 2014-01-17 MED ORDER — TRAVOPROST (BAK FREE) 0.004 % OP SOLN
1.0000 [drp] | Freq: Every day | OPHTHALMIC | Status: DC
  Filled 2014-01-17: qty 2.5

## 2014-01-17 MED ORDER — AMLODIPINE BESYLATE 5 MG PO TABS
5.0000 mg | ORAL_TABLET | Freq: Every day | ORAL | Status: DC
  Administered 2014-01-17: 5 mg via ORAL
  Filled 2014-01-17: qty 1

## 2014-01-17 MED ORDER — REGADENOSON 0.4 MG/5ML IV SOLN
0.4000 mg | Freq: Once | INTRAVENOUS | Status: AC
  Administered 2014-01-17: 0.4 mg via INTRAVENOUS

## 2014-01-17 MED ORDER — GLIPIZIDE ER 2.5 MG PO TB24
2.5000 mg | ORAL_TABLET | Freq: Every day | ORAL | Status: DC
  Administered 2014-01-17: 2.5 mg via ORAL
  Filled 2014-01-17: qty 1

## 2014-01-17 MED ORDER — SODIUM CHLORIDE 0.9 % IJ SOLN: 3.0000 mL | INTRAMUSCULAR | Status: DC | PRN

## 2014-01-17 MED ORDER — NITROGLYCERIN 0.4 MG SL SUBL
0.4000 mg | SUBLINGUAL_TABLET | SUBLINGUAL | Status: DC | PRN
  Administered 2014-01-17: 0.4 mg via SUBLINGUAL

## 2014-01-17 MED ORDER — TECHNETIUM TC 99M SESTAMIBI GENERIC - CARDIOLITE
30.0000 | Freq: Once | INTRAVENOUS | Status: AC | PRN
  Administered 2014-01-17: 30 via INTRAVENOUS

## 2014-01-17 MED ORDER — PANTOPRAZOLE SODIUM 40 MG PO TBEC
40.0000 mg | DELAYED_RELEASE_TABLET | Freq: Every day | ORAL | Status: DC
  Administered 2014-01-17: 40 mg via ORAL
  Filled 2014-01-17: qty 1

## 2014-01-17 MED ORDER — INFLUENZA VAC SPLIT QUAD 0.5 ML IM SUSP: 0.5000 mL | INTRAMUSCULAR | Status: DC

## 2014-01-17 MED ORDER — NITROGLYCERIN 0.4 MG SL SUBL: 0.4000 mg | SUBLINGUAL_TABLET | SUBLINGUAL | Status: DC | PRN

## 2014-01-17 MED ORDER — SODIUM CHLORIDE 0.9 % IV SOLN: 250.0000 mL | INTRAVENOUS | Status: DC | PRN

## 2014-01-17 MED ORDER — BENAZEPRIL HCL 20 MG PO TABS
20.0000 mg | ORAL_TABLET | Freq: Every day | ORAL | Status: DC
  Administered 2014-01-17: 20 mg via ORAL
  Filled 2014-01-17: qty 1

## 2014-01-17 MED ORDER — MORPHINE SULFATE 2 MG/ML IJ SOLN
2.0000 mg | Freq: Once | INTRAMUSCULAR | Status: AC
  Administered 2014-01-17: 2 mg via INTRAVENOUS
  Filled 2014-01-17: qty 1

## 2014-01-17 MED ORDER — POTASSIUM CHLORIDE CRYS ER 20 MEQ PO TBCR
40.0000 meq | EXTENDED_RELEASE_TABLET | Freq: Once | ORAL | Status: AC
  Administered 2014-01-17: 40 meq via ORAL
  Filled 2014-01-17: qty 2

## 2014-01-17 MED ORDER — SIMVASTATIN 20 MG PO TABS
20.0000 mg | ORAL_TABLET | Freq: Every day | ORAL | Status: DC
  Filled 2014-01-17: qty 1

## 2014-01-17 MED ORDER — TECHNETIUM TC 99M SESTAMIBI GENERIC - CARDIOLITE
10.0000 | Freq: Once | INTRAVENOUS | Status: AC | PRN
  Administered 2014-01-17: 10 via INTRAVENOUS

## 2014-01-17 MED ORDER — ASPIRIN 81 MG PO CHEW
81.0000 mg | CHEWABLE_TABLET | Freq: Every day | ORAL | Status: DC
  Administered 2014-01-17: 81 mg via ORAL
  Filled 2014-01-17: qty 1

## 2014-01-17 MED ORDER — HEPARIN BOLUS VIA INFUSION
4000.0000 [IU] | Freq: Once | INTRAVENOUS | Status: AC
  Administered 2014-01-17: 4000 [IU] via INTRAVENOUS
  Filled 2014-01-17: qty 4000

## 2014-01-17 MED ORDER — SODIUM CHLORIDE 0.9 % IJ SOLN: 3.0000 mL | Freq: Two times a day (BID) | INTRAMUSCULAR | Status: DC

## 2014-01-17 NOTE — Progress Notes (Signed)
ANTICOAGULATION CONSULT NOTE - Initial Consult  Pharmacy Consult for heparin Indication: chest pain/ACS  Allergies  Allergen Reactions  . Aspirin Other (See Comments)    Makes stomach burn    Patient Measurements: Height: 5\' 4"  (162.6 cm) Weight: 167 lb 14.4 oz (76.159 kg) IBW/kg (Calculated) : 54.7 Heparin Dosing Weight: 70kg  Vital Signs: Temp: 98.7 F (37.1 C) (01/20 0546) Temp src: Oral (01/20 0546) BP: 178/78 mmHg (01/20 0546) Pulse Rate: 72 (01/20 0546)  Labs:  Recent Labs  01/17/14 0028  HGB 13.4  HCT 40.1  PLT 264  CREATININE 1.12*    Estimated Creatinine Clearance: 45.4 ml/min (by C-G formula based on Cr of 1.12).   Medical History: Past Medical History  Diagnosis Date  . Diabetes mellitus   . Hypertension   . Glaucoma   . Hyperlipidemia   . Arthritis   . Cataracts, both eyes   . Colon polyps     Hyperplastic 2003  . Internal hemorrhoid   . External hemorrhoid   . Personal history of colonic polyps 03/03/2012  . Anemia   . Anxiety     Medications:  Prescriptions prior to admission  Medication Sig Dispense Refill  . ALPRAZolam (XANAX) 0.25 MG tablet Take 0.25 mg by mouth daily as needed for anxiety.       Marland Kitchen aspirin 81 MG tablet Take 81 mg by mouth daily.       . benazepril (LOTENSIN) 20 MG tablet Take 20 mg by mouth daily.      Marland Kitchen glipiZIDE (GLUCOTROL XL) 2.5 MG 24 hr tablet Take 2.5 mg by mouth daily.      Marland Kitchen loratadine (CLARITIN) 10 MG tablet Take 10 mg by mouth daily as needed for allergies.       . metoprolol tartrate (LOPRESSOR) 25 MG tablet Take 25 mg by mouth daily.       . nitroGLYCERIN (NITROSTAT) 0.4 MG SL tablet Place 0.4 mg under the tongue every 5 (five) minutes as needed for chest pain.       Marland Kitchen omeprazole (PRILOSEC) 20 MG capsule Take 20 mg by mouth daily.      . simvastatin (ZOCOR) 20 MG tablet Take 20 mg by mouth at bedtime.      . travoprost, benzalkonium, (TRAVATAN) 0.004 % ophthalmic solution Place 1 drop into both eyes at  bedtime.       Scheduled:  . aspirin  81 mg Oral Daily  . benazepril  20 mg Oral Daily  . glipiZIDE  2.5 mg Oral Daily  . metoprolol tartrate  25 mg Oral Daily  . pantoprazole  40 mg Oral Daily  . simvastatin  20 mg Oral QHS  . sodium chloride  3 mL Intravenous Q12H  . travoprost (benzalkonium)  1 drop Both Eyes QHS    Assessment: 73yo female c/o CP/palpitations associated w/ nausea, took NTG PTA w/ relief, initial troponin negative, to begin heparin.  Goal of Therapy:  Heparin level 0.3-0.7 units/ml Monitor platelets by anticoagulation protocol: Yes   Plan:  Will give heparin 4000 units IV bolus x1 followed by gtt at 1000 units/hr and monitor heparin levels and CBC.  Wynona Neat, PharmD, BCPS  01/17/2014,5:59 AM

## 2014-01-17 NOTE — Progress Notes (Signed)
Spoke with Dr.kadakia, made aware that pt still having some chest pressure and is to have myoview with AM; MD states that he is aware and that ok for pt to go down to [facility] with RN

## 2014-01-17 NOTE — ED Notes (Signed)
PT reports CP started at [place] on 01-16-14 . Felt like palpations . Pt also reported nausea . Pt took one NGT with relief.

## 2014-01-17 NOTE — ED Provider Notes (Signed)
CSN: [id number]     Arrival date & time 01/17/14  0005 History   First MD Initiated Contact with Patient 01/17/14 0044     Chief Complaint  Patient presents with  . Chest Pain   (Consider location/radiation/quality/duration/timing/severity/associated sxs/prior Treatment) HPI Comments: 73 yo old female with htn, DM, chol presents with chest tightness since 1030 pm tonight while at rest.  Mild palpitations as well with nausea.  Mild radiation left arm.  Took her nitro with relief.  Similar to previous.  Follows Dr Doylene Canard cardiology, last stress 2012.  At times with exertion, other times with pressing on chest.  No recent surgery, active CA or blood clot hx.    Patient is a 73 y.o. female presenting with chest pain. The history is provided by the patient.  Chest Pain Associated symptoms: nausea   Associated symptoms: no abdominal pain, no back pain, no fever, no headache, no shortness of breath and not vomiting     Past Medical History  Diagnosis Date  . Diabetes mellitus   . Hypertension   . Glaucoma   . Hyperlipidemia   . Arthritis   . Cataracts, both eyes   . Colon polyps     Hyperplastic 2003  . Internal hemorrhoid   . External hemorrhoid   . Personal history of colonic polyps 03/03/2012  . Anemia   . Anxiety    Past Surgical History  Procedure Laterality Date  . Tubal ligation  1971  . Cholecystectomy    . Abdominal hysterectomy    . Colonoscopy     Family History  Problem Relation Age of Onset  . Colon cancer Neg Hx   . Stomach cancer Neg Hx   . Heart disease Mother   . Heart disease Brother   . Heart disease Sister     x 2  . Diabetes Mother   . Stroke Brother   . Stroke Daughter   . Diabetes Maternal Grandmother   . Stroke Maternal Grandmother   . Stroke Father   . Arthritis Mother   . Breast cancer      niece   History  Substance Use Topics  . Smoking status: Never Smoker   . Smokeless tobacco: Never Used  . Alcohol Use: No   OB History   Grav Para  Term Preterm Abortions TAB SAB Ect Mult Living                 Review of Systems  Constitutional: Positive for appetite change. Negative for fever and chills.  HENT: Negative for congestion.   Eyes: Negative for visual disturbance.  Respiratory: Negative for shortness of breath.   Cardiovascular: Positive for chest pain. Negative for leg swelling.  Gastrointestinal: Positive for nausea. Negative for vomiting and abdominal pain.  Genitourinary: Negative for dysuria and flank pain.  Musculoskeletal: Negative for back pain, neck pain and neck stiffness.  Skin: Negative for rash.  Neurological: Negative for light-headedness and headaches.    Allergies  Aspirin  Home Medications   Current Outpatient Rx  Name  Route  Sig  Dispense  Refill  . ALPRAZolam (XANAX) 0.25 MG tablet   Oral   Take 0.25 mg by mouth daily as needed for anxiety.          Marland Kitchen aspirin 81 MG tablet   Oral   Take 81 mg by mouth daily.          . benazepril (LOTENSIN) 20 MG tablet   Oral   Take 20 mg by  mouth daily.         Marland Kitchen glipiZIDE (GLUCOTROL XL) 2.5 MG 24 hr tablet   Oral   Take 2.5 mg by mouth daily.         Marland Kitchen loratadine (CLARITIN) 10 MG tablet   Oral   Take 10 mg by mouth daily as needed for allergies.          . metoprolol tartrate (LOPRESSOR) 25 MG tablet   Oral   Take 25 mg by mouth daily.          . nitroGLYCERIN (NITROSTAT) 0.4 MG SL tablet   Sublingual   Place 0.4 mg under the tongue every 5 (five) minutes as needed for chest pain.          Marland Kitchen omeprazole (PRILOSEC) 20 MG capsule   Oral   Take 20 mg by mouth daily.         . simvastatin (ZOCOR) 20 MG tablet   Oral   Take 20 mg by mouth at bedtime.         . travoprost, benzalkonium, (TRAVATAN) 0.004 % ophthalmic solution   Both Eyes   Place 1 drop into both eyes at bedtime.          BP 165/91  Pulse 74  Temp(Src) 98.4 F (36.9 C) (Oral)  Resp 15  Ht 5\' 5"  (1.651 m)  Wt 173 lb (78.472 kg)  BMI 28.79 kg/m2   SpO2 99% Physical Exam  Nursing note and vitals reviewed. Constitutional: She is oriented to person, place, and time. She appears well-developed and well-nourished.  HENT:  Head: Normocephalic and atraumatic.  Eyes: Conjunctivae are normal. Right eye exhibits no discharge. Left eye exhibits no discharge.  Neck: Normal range of motion. Neck supple. No tracheal deviation present.  Cardiovascular: Normal rate, regular rhythm and intact distal pulses.   Murmur (3+ SM upper sternum) heard. Pulmonary/Chest: Effort normal and breath sounds normal.  Abdominal: Soft. She exhibits no distension. There is no tenderness. There is no guarding.  Musculoskeletal: She exhibits no edema and no tenderness.  Neurological: She is alert and oriented to person, place, and time.  Skin: Skin is warm. No rash noted.  Psychiatric: She has a normal mood and affect.    ED Course  Procedures (including critical care time) Labs Review Labs Reviewed  COMPREHENSIVE METABOLIC PANEL - Abnormal; Notable for the following:    Potassium 3.5 (*)    Glucose, Bld 149 (*)    Creatinine, Ser 1.12 (*)    GFR calc non Af Amer 48 (*)    GFR calc Af Amer 55 (*)    All other components within normal limits  CBC  POCT I-STAT TROPONIN I  POCT I-STAT TROPONIN I   Imaging Review Dg Chest Port 1 View  01/17/2014   CLINICAL DATA:  Mid chest pain.  EXAM: PORTABLE CHEST - 1 VIEW  COMPARISON:  DG CHEST 1V PORT dated 09/22/2011; DG CHEST 2 VIEW dated 10/17/2010  FINDINGS: Midline trachea. Normal heart size and mediastinal contours. Azygos fissure. No pleural effusion or pneumothorax. Clear lungs.  IMPRESSION: No acute cardiopulmonary disease.   Electronically Signed   By: Abigail Miyamoto M.D.   On: 01/17/2014 00:56    EKG Interpretation    Date/Time:  Tuesday January 17 2014 00:13:21 EST Ventricular Rate:  81 PR Interval:  164 QRS Duration: 88 QT Interval:  338 QTC Calculation: 392 R Axis:   48 Text Interpretation:  Normal sinus  rhythm with sinus arrhythmia ST depression  lateral, inferior with T wave changes. Confirmed by Marque Bango  MD, Quinn Bartling ([id number]) on 01/17/2014 12:45:54 AM            MDM  No diagnosis found. EKG showed worsening depressions inferior/ lateral. No concern for PE or dissection at this time.  Cardiac eval in ED.  No current cp on exam. Pt had asa.  Plan for consult to her cardiology group for observation/tele. Spoke with cardiology for admission for chest pain, ekg changes.  The patients results and plan were reviewed and discussed.   Any x-rays performed were personally reviewed by myself.   Differential diagnosis were considered with the presenting HPI.  Diagnosis: Acute chest pain, ekg changes, DM, HTN, Hypokalemia  [id number]  Admission/ observation were discussed with the admitting physician, patient and/or family and they are comfortable with the plan.     Mariea Clonts, MD 01/17/14 [phone]

## 2014-01-17 NOTE — Progress Notes (Signed)
D/c orders received;IV removed with gauze on, pt remains in stable condition, pt meds and instructions reviewed and given to pt; pt d/c to home; after taking pts BP noticed that pump knot, size  Of a golf ball, started to form on pts L hand; pt stated that she had blood drawn there about a hr ago; Dr.Kadakia paged and made aware; MD stopped by to evaluate, pressure dressing applied to pts hand, MD informed pt to wrap with ace bandage and apply ice to site; pt states she still wanted to go home

## 2014-01-17 NOTE — Progress Notes (Signed)
ANTICOAGULATION CONSULT NOTE - Follow Up Consult  Pharmacy Consult for Heparin Indication: chest pain/ACS  Allergies  Allergen Reactions  . Aspirin Other (See Comments)    Makes stomach burn    Patient Measurements: Height: 5\' 4"  (162.6 cm) Weight: 167 lb 14.4 oz (76.159 kg) IBW/kg (Calculated) : 54.7 Heparin Dosing Weight: 70kg  Vital Signs: Temp: 98.1 F (36.7 C) (01/20 1323) Temp src: Oral (01/20 1323) BP: 172/63 mmHg (01/20 1323) Pulse Rate: 72 (01/20 1323)  Labs:  Recent Labs  01/17/14 0028 01/17/14 0705 01/17/14 1300 01/17/14 1440  HGB 13.4 12.9  --   --   HCT 40.1 38.2  --   --   PLT 264 262  --   --   LABPROT  --  13.0  --   --   INR  --  1.00  --   --   HEPARINUNFRC  --   --   --  0.32  CREATININE 1.12* 0.95  --   --   TROPONINI  --  <0.30 <0.30  --     Estimated Creatinine Clearance: 53.5 ml/min (by C-G formula based on Cr of 0.95).   Medications:  Heparin @ 1000 units/hr  Assessment: 72yof started on heparin this morning for chest pain. First heparin level is therapeutic. Troponins negative so far. Plan is for [facility] tomorrow. CBC stable.  Goal of Therapy:  Heparin level 0.3-0.7 units/ml Monitor platelets by anticoagulation protocol: Yes   Plan:  1) Continue heparin at 1000 units/hr 2) Check 8 hour level to confirm  Deboraha Sprang 01/17/2014,3:35 PM

## 2014-01-17 NOTE — ED Notes (Signed)
Zavitz, MD at bedside 

## 2014-01-17 NOTE — Progress Notes (Signed)
When report was called, pt reported 4/10 pain, made ED RN aware that pt not accepted on this floor in active chest pain.  ED RN gave morphine, called back, stated pain was gone and that MD had been notified and wanted pt to come to this floor.  When pt arrived to floor, pt denied chest pain but reported some chest pressure, ECG was done, sublingual nitro given.  Pt reported a slight decrease in pressure with nitro but an increase in palpitations and pain which subsided after a few minutes.  Pt currently resting comfortably, no sob, 1/10 pressure in mid chest that does not radiate, and no ectopy noted on telemetry.  Nuclear Medicine has notified RN that pt to have stress test around 10 am. Will continue to monitor.

## 2014-01-17 NOTE — H&P (Signed)
Heidi Wolf is an 73 y.o. female.   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 No fever or cough. Had normal cardiac cath in 2012.  Past Medical History  Diagnosis Date  . Diabetes mellitus   . Hypertension   . Glaucoma   . Hyperlipidemia   . Arthritis   . Cataracts, both eyes   . Colon polyps     Hyperplastic 2003  . Internal hemorrhoid   . External hemorrhoid   . Personal history of colonic polyps 03/03/2012  . Anemia   . Anxiety       Past Surgical History  Procedure Laterality Date  . Tubal ligation  1971  . Cholecystectomy    . Abdominal hysterectomy    . Colonoscopy      Family History  Problem Relation Age of Onset  . Colon cancer Neg Hx   . Stomach cancer Neg Hx   . Heart disease Mother   . Heart disease Brother   . Heart disease Sister     x 2  . Diabetes Mother   . Stroke Brother   . Stroke Daughter   . Diabetes Maternal Grandmother   . Stroke Maternal Grandmother   . Stroke Father   . Arthritis Mother   . Breast cancer      niece   Social History:  reports that she has never smoked. She has never used smokeless tobacco. She reports that she does not drink alcohol or use illicit drugs.  Allergies:  Allergies  Allergen Reactions  . Aspirin Other (See Comments)    Makes stomach burn     (Not in a hospital admission)  Results for orders placed during the hospital encounter of 01/17/14 (from the past 48 hour(s))  CBC     Status: None   Collection Time    01/17/14 12:28 AM      Result Value Range   WBC 9.3  4.0 - 10.5 K/uL   RBC 4.93  3.87 - 5.11 MIL/uL   Hemoglobin 13.4  12.0 - 15.0 g/dL   HCT 40.1  36.0 - 46.0 %   MCV 81.3  78.0 - 100.0 fL   MCH 27.2  26.0 - 34.0 pg   MCHC 33.4  30.0 - 36.0 g/dL   RDW 13.8  11.5 - 15.5 %   Platelets 264  150 - 400 K/uL  COMPREHENSIVE METABOLIC PANEL      Status: Abnormal   Collection Time    01/17/14 12:28 AM      Result Value Range   Sodium 140  137 - 147 mEq/L   Potassium 3.5 (*) 3.7 - 5.3 mEq/L   Chloride 101  96 - 112 mEq/L   CO2 25  19 - 32 mEq/L   Glucose, Bld 149 (*) 70 - 99 mg/dL   BUN 14  6 - 23 mg/dL   Creatinine, Ser 1.12 (*) 0.50 - 1.10 mg/dL   Calcium 9.7  8.4 - 10.5 mg/dL   Total Protein 7.9  6.0 - 8.3 g/dL   Albumin 3.9  3.5 - 5.2 g/dL   AST 25  0 - 37 U/L   ALT 16  0 - 35 U/L   Alkaline Phosphatase 64  39 - 117 U/L   Total Bilirubin 0.5  0.3 - 1.2 mg/dL   GFR calc non Af Amer 48 (*) >90  mL/min   GFR calc Af Amer 55 (*) >90 mL/min   Comment: (NOTE)     The eGFR has been calculated using the CKD EPI equation.     This calculation has not been validated in all clinical situations.     eGFR's persistently <90 mL/min signify possible Chronic Kidney     Disease.  POCT I-STAT TROPONIN I     Status: None   Collection Time    01/17/14 12:36 AM      Result Value Range   Troponin i, poc 0.00  0.00 - 0.08 ng/mL   Comment 3            Comment: Due to the release kinetics of cTnI,     a negative result within the first hours     of the onset of symptoms does not rule out     myocardial infarction with certainty.     If myocardial infarction is still suspected,     repeat the test at appropriate intervals.  POCT I-STAT TROPONIN I     Status: None   Collection Time    01/17/14  3:06 AM      Result Value Range   Troponin i, poc 0.00  0.00 - 0.08 ng/mL   Comment 3            Comment: Due to the release kinetics of cTnI,     a negative result within the first hours     of the onset of symptoms does not rule out     myocardial infarction with certainty.     If myocardial infarction is still suspected,     repeat the test at appropriate intervals.   Dg Chest Port 1 View  01/17/2014   CLINICAL DATA:  Mid chest pain.  EXAM: PORTABLE CHEST - 1 VIEW  COMPARISON:  DG CHEST 1V PORT dated 09/22/2011; DG CHEST 2 VIEW dated  10/17/2010  FINDINGS: Midline trachea. Normal heart size and mediastinal contours. Azygos fissure. No pleural effusion or pneumothorax. Clear lungs.  IMPRESSION: No acute cardiopulmonary disease.   Electronically Signed   By: Abigail Miyamoto M.D.   On: 01/17/2014 00:56    ROS Constitutional: Positive for appetite change. Negative for fever and chills.  HENT: Negative for congestion.  Eyes: Negative for visual disturbance.  Respiratory: Negative for shortness of breath.  Cardiovascular: Positive for chest pain. Negative for leg swelling.  Gastrointestinal: Positive for nausea. Negative for vomiting and abdominal pain.  Genitourinary: Negative for dysuria and flank pain.  Musculoskeletal: Negative for back pain, neck pain and neck stiffness.  Skin: Negative for rash.  Neurological: Negative for light-headedness and headaches.   Blood pressure 153/83, pulse 74, temperature 98.4 F (36.9 C), temperature source Oral, resp. rate 16, height 5' 5"  (1.651 m), weight 78.472 kg (173 lb), SpO2 96.00%.  GENERAL: The patient is short and averagely nourished black female in no acute distress.  HEENT: The patient is normocephalic, atraumatic. She wears glasses. Has brown eyes. Conjunctivae pink. Sclera-non-icteric. NECK: No JVD. Positive faint bruit.  LUNGS: Clear bilaterally.  HEART: Normal S1 and S2. III/VI systolic murmur. ABDOMEN: Soft and nontender.  EXTREMITIES: No edema, cyanosis, or clubbing.  SKIN: Warm and dry.  NEUROLOGICAL: The patient moves all 4 extremities. Cranial nerves grossly intact. Speech is normal.  Assessment/Plan Chest pain DM, II Hypertension Hyperlipidemia Anxiety.  Nuclear stress test in AM.  Demir Titsworth S 01/17/2014, 3:20 AM

## 2014-01-17 NOTE — Progress Notes (Signed)
UR completed 

## 2014-01-17 NOTE — ED Notes (Addendum)
Pt states she took her own Nitro tab while in the room about 10-15 mins ago pt states pain level is still at 3 but feels more like pressure now. Md  Paged for further assessment before moving to floor.

## 2014-01-17 NOTE — Discharge Summary (Signed)
Physician Discharge Summary  Patient ID: Heidi Wolf MRN: [id number] DOB/AGE: 05/13/1941 73 y.o.  Admit date: 01/17/2014 Discharge date: 01/17/2014  Admission Diagnoses: Chest pain  DM, II  Hypertension  Hyperlipidemia  Anxiety.  Discharge Diagnoses:  Principle Problem: * Chest pain at rest * DM, II  Hypertension  Hyperlipidemia  Anxiety.  Discharged Condition: fair  Hospital Course: 73 year old female with diabetes, type II, hypertension and hypercholesterolemia had substernal chest tightness with radiation to left arm and relief with SL NTG. No fever or cough. She had normal cardiac cath in 2012. She underwent nuclear stress test with no reversible ischemia and preserved LV systolic function.  Consults: None  Significant Diagnostic Studies: labs: Normal CBC and CMET with mild elevation of blood sugar and creatinine of 1.12  EKG-Sinus rhythm and lateral infarct and anterior ischemia.  Nuclear stress test with no reversible ischemia.  Chest x-ray: No acute disease.  Treatments: analgesia: Morphine and cardiac meds: benazepril (Lotensin), metoprolol and Simvastatin.  Discharge Exam: Blood pressure 172/63, pulse 72, temperature 98.1 F (36.7 C), temperature source Oral, resp. rate 18, height 5\' 4"  (1.626 m), weight 76.159 kg (167 lb 14.4 oz), SpO2 100.00%.  GENERAL: The patient is short and averagely nourished black female in no acute distress.  HEENT: The patient is normocephalic, atraumatic. She wears glasses. Has brown eyes. Conjunctivae pink. Sclera-non-icteric.  NECK: No JVD. Positive faint bruit.  LUNGS: Clear bilaterally.  HEART: Normal S1 and S2. III/VI systolic murmur.  ABDOMEN: Soft and nontender.  EXTREMITIES: No edema, cyanosis, or clubbing.  SKIN: Warm and dry.  NEUROLOGICAL: The patient moves all 4 extremities. Cranial nerves grossly intact. Speech is normal.  Disposition: 01-Home or Self Care     Medication List         ALPRAZolam 0.25 MG tablet   Commonly known as:  XANAX  Take 0.25 mg by mouth daily as needed for anxiety.     aspirin 81 MG tablet  Take 81 mg by mouth daily.     benazepril 20 MG tablet  Commonly known as:  LOTENSIN  Take 20 mg by mouth daily.     glipiZIDE 2.5 MG 24 hr tablet  Commonly known as:  GLUCOTROL XL  Take 2.5 mg by mouth daily.     loratadine 10 MG tablet  Commonly known as:  CLARITIN  Take 10 mg by mouth daily as needed for allergies.     metoprolol tartrate 25 MG tablet  Commonly known as:  LOPRESSOR  Take 25 mg by mouth daily.     nitroGLYCERIN 0.4 MG SL tablet  Commonly known as:  NITROSTAT  Place 0.4 mg under the tongue every 5 (five) minutes as needed for chest pain.     omeprazole 20 MG capsule  Commonly known as:  PRILOSEC  Take 20 mg by mouth daily.     simvastatin 20 MG tablet  Commonly known as:  ZOCOR  Take 20 mg by mouth at bedtime.     travoprost (benzalkonium) 0.004 % ophthalmic solution  Commonly known as:  TRAVATAN  Place 1 drop into both eyes at bedtime.         SignedDixie Dials S 01/17/2014, 6:01 PM

## 2014-05-01 ENCOUNTER — Emergency Department (HOSPITAL_COMMUNITY)
Admission: EM | Admit: 2014-05-01 | Discharge: 2014-05-01 | Disposition: A | Discharge status: Home / Self Care | Payer: Medicare Other | Attending: Emergency Medicine | Admitting: Emergency Medicine

## 2014-05-01 ENCOUNTER — Emergency Department (HOSPITAL_COMMUNITY): Payer: Medicare Other

## 2014-05-01 ENCOUNTER — Encounter (HOSPITAL_COMMUNITY): Payer: Self-pay | Admitting: Emergency Medicine

## 2014-05-01 DIAGNOSIS — E785 Hyperlipidemia, unspecified: Secondary | ICD-10-CM | POA: Insufficient documentation

## 2014-05-01 DIAGNOSIS — Z860100 Personal history of colon polyps, unspecified: Secondary | ICD-10-CM | POA: Insufficient documentation

## 2014-05-01 DIAGNOSIS — R002 Palpitations: Secondary | ICD-10-CM | POA: Insufficient documentation

## 2014-05-01 DIAGNOSIS — R55 Syncope and collapse: Secondary | ICD-10-CM

## 2014-05-01 DIAGNOSIS — Y92009 Unspecified place in unspecified non-institutional (private) residence as the place of occurrence of the external cause: Secondary | ICD-10-CM | POA: Insufficient documentation

## 2014-05-01 DIAGNOSIS — S99919A Unspecified injury of unspecified ankle, initial encounter: Secondary | ICD-10-CM

## 2014-05-01 DIAGNOSIS — Z7982 Long term (current) use of aspirin: Secondary | ICD-10-CM | POA: Insufficient documentation

## 2014-05-01 DIAGNOSIS — Z8669 Personal history of other diseases of the nervous system and sense organs: Secondary | ICD-10-CM | POA: Insufficient documentation

## 2014-05-01 DIAGNOSIS — T887XXA Unspecified adverse effect of drug or medicament, initial encounter: Secondary | ICD-10-CM

## 2014-05-01 DIAGNOSIS — S199XXA Unspecified injury of neck, initial encounter: Secondary | ICD-10-CM

## 2014-05-01 DIAGNOSIS — Z862 Personal history of diseases of the blood and blood-forming organs and certain disorders involving the immune mechanism: Secondary | ICD-10-CM | POA: Insufficient documentation

## 2014-05-01 DIAGNOSIS — F411 Generalized anxiety disorder: Secondary | ICD-10-CM | POA: Insufficient documentation

## 2014-05-01 DIAGNOSIS — R61 Generalized hyperhidrosis: Secondary | ICD-10-CM | POA: Insufficient documentation

## 2014-05-01 DIAGNOSIS — K649 Unspecified hemorrhoids: Secondary | ICD-10-CM | POA: Insufficient documentation

## 2014-05-01 DIAGNOSIS — Z79899 Other long term (current) drug therapy: Secondary | ICD-10-CM | POA: Insufficient documentation

## 2014-05-01 DIAGNOSIS — Y9389 Activity, other specified: Secondary | ICD-10-CM | POA: Insufficient documentation

## 2014-05-01 DIAGNOSIS — S0993XA Unspecified injury of face, initial encounter: Secondary | ICD-10-CM | POA: Insufficient documentation

## 2014-05-01 DIAGNOSIS — T465X5A Adverse effect of other antihypertensive drugs, initial encounter: Secondary | ICD-10-CM | POA: Insufficient documentation

## 2014-05-01 DIAGNOSIS — R110 Nausea: Secondary | ICD-10-CM | POA: Insufficient documentation

## 2014-05-01 DIAGNOSIS — S8990XA Unspecified injury of unspecified lower leg, initial encounter: Secondary | ICD-10-CM | POA: Insufficient documentation

## 2014-05-01 DIAGNOSIS — E119 Type 2 diabetes mellitus without complications: Secondary | ICD-10-CM | POA: Insufficient documentation

## 2014-05-01 DIAGNOSIS — R296 Repeated falls: Secondary | ICD-10-CM | POA: Insufficient documentation

## 2014-05-01 DIAGNOSIS — S99929A Unspecified injury of unspecified foot, initial encounter: Secondary | ICD-10-CM

## 2014-05-01 DIAGNOSIS — M129 Arthropathy, unspecified: Secondary | ICD-10-CM | POA: Insufficient documentation

## 2014-05-01 DIAGNOSIS — I10 Essential (primary) hypertension: Secondary | ICD-10-CM | POA: Insufficient documentation

## 2014-05-01 LAB — CBC WITH DIFFERENTIAL/PLATELET
Basophils Absolute: 0 K/uL (ref 0.0–0.1)
Basophils Relative: 0 % (ref 0–1)
Eosinophils Absolute: 0.1 10*3/uL (ref 0.0–0.7)
Eosinophils Relative: 1 % (ref 0–5)
HCT: 38 % (ref 36.0–46.0)
Hemoglobin: 12.6 g/dL (ref 12.0–15.0)
Lymphocytes Relative: 27 % (ref 12–46)
Lymphs Abs: 2.9 10*3/uL (ref 0.7–4.0)
MCH: 27 pg (ref 26.0–34.0)
MCHC: 33.2 g/dL (ref 30.0–36.0)
MCV: 81.5 fL (ref 78.0–100.0)
Monocytes Absolute: 0.5 10*3/uL (ref 0.1–1.0)
Monocytes Relative: 5 % (ref 3–12)
Neutro Abs: 7.4 10*3/uL (ref 1.7–7.7)
Neutrophils Relative %: 67 % (ref 43–77)
Platelets: 239 10*3/uL (ref 150–400)
RBC: 4.66 MIL/uL (ref 3.87–5.11)
RDW: 14.4 % (ref 11.5–15.5)
WBC: 11 10*3/uL — ABNORMAL HIGH (ref 4.0–10.5)

## 2014-05-01 LAB — COMPREHENSIVE METABOLIC PANEL
ALT: 15 U/L (ref 0–35)
AST: 20 U/L (ref 0–37)
Albumin: 3.4 g/dL — ABNORMAL LOW (ref 3.5–5.2)
Alkaline Phosphatase: 62 U/L (ref 39–117)
BUN: 16 mg/dL (ref 6–23)
CO2: 25 mEq/L (ref 19–32)
Calcium: 9.2 mg/dL (ref 8.4–10.5)
Chloride: 103 mEq/L (ref 96–112)
Creatinine, Ser: 0.96 mg/dL (ref 0.50–1.10)
GFR calc Af Amer: 67 mL/min — ABNORMAL LOW (ref >90)
GFR calc non Af Amer: 58 mL/min — ABNORMAL LOW (ref >90)
Glucose, Bld: 151 mg/dL — ABNORMAL HIGH (ref 70–99)
Potassium: 3.5 mEq/L — ABNORMAL LOW (ref 3.7–5.3)
Sodium: 143 mEq/L (ref 137–147)
Total Bilirubin: 0.3 mg/dL (ref 0.3–1.2)
Total Protein: 7.2 g/dL (ref 6.0–8.3)

## 2014-05-01 LAB — URINALYSIS, ROUTINE W REFLEX MICROSCOPIC
Bilirubin Urine: NEGATIVE
Glucose, UA: NEGATIVE mg/dL
Hgb urine dipstick: NEGATIVE
Ketones, ur: NEGATIVE mg/dL
Nitrite: NEGATIVE
Protein, ur: NEGATIVE mg/dL
Specific Gravity, Urine: 1.024 (ref 1.005–1.030)
Urobilinogen, UA: 1 mg/dL (ref 0.0–1.0)
pH: 5.5 (ref 5.0–8.0)

## 2014-05-01 LAB — I-STAT TROPONIN, ED: Troponin i, poc: 0.01 ng/mL (ref 0.00–0.08)

## 2014-05-01 LAB — URINE MICROSCOPIC-ADD ON

## 2014-05-01 LAB — POC OCCULT BLOOD, ED: Fecal Occult Bld: NEGATIVE

## 2014-05-01 NOTE — ED Notes (Signed)
Pt arrived via EMS s/p syncopal episode. Per EMS pt was not feeling well attempted to get up and walk to bed. Daughter found pt in floor. Per EMS arrival pt was alert x4 clammy. Denies injury from fall.

## 2014-05-01 NOTE — ED Provider Notes (Signed)
CSN: [id number]     Arrival date & time 05/01/14  1519 History   First MD Initiated Contact with Patient 05/01/14 1523     Chief Complaint  Patient presents with  . Loss of Consciousness     (Consider location/radiation/quality/duration/timing/severity/associated sxs/prior Treatment) HPI Comments: Patient with history of hypertension, took her usual metoprolol and benazepril this morning. She had a dental appointment at 9 AM. There they discovered that her blood pressure was very elevated, did not feel comfortable performing any dental procedures, referred her to her primary care physician. She went and saw Dr. Doylene Canard around 10 AM. He ever samples of another blood pressure medication and informed her that to take half a tablet for now for her to get used to and he would recheck her next week. She also takes a third blood pressure medicine normally at nighttime, she thinks Norvasc. She was at home at about 2:00 when she took the first half tablet. About 20 minutes later, she became very diaphoretic, weak and a little nauseated. She told her daughter that she was not feeling well and she wanted to go to her room to lay down. She recalls having a little difficulty standing up and after she stood up, she fell backwards and was unresponsive and unconscious for a unknown amount of time. She denies recalling that she had any shortness of breath or chest pain. She has a history of palpitations and recalls feeling some. She's unsure she got lightheaded or not. She denies any recent long distance travel. Currently denies any chest pain, pleuritic pain, shortness of breath. She denies any palpitations. She reports a little bit of knee pain from falling. She denies a headache, neck pain, shoulder pain. No back pain. She reports that she feels back to her baseline at present. She denies any recent nausea vomiting or diarrhea, denies any black stools. She does have an occasional slight bleed from a known  hemorrhoid.  Patient is a 73 y.o. female presenting with syncope. The history is provided by the patient and the EMS personnel.  Loss of Consciousness Associated symptoms: diaphoresis, nausea and palpitations   Associated symptoms: no chest pain, no dizziness, no fever, no shortness of breath and no weakness     Past Medical History  Diagnosis Date  . Diabetes mellitus   . Hypertension   . Glaucoma   . Hyperlipidemia   . Arthritis   . Cataracts, both eyes   . Colon polyps     Hyperplastic 2003  . Internal hemorrhoid   . External hemorrhoid   . Personal history of colonic polyps 03/03/2012  . Anemia   . Anxiety    Past Surgical History  Procedure Laterality Date  . Tubal ligation  1971  . Cholecystectomy    . Abdominal hysterectomy    . Colonoscopy     Family History  Problem Relation Age of Onset  . Colon cancer Neg Hx   . Stomach cancer Neg Hx   . Heart disease Mother   . Heart disease Brother   . Heart disease Sister     x 2  . Diabetes Mother   . Stroke Brother   . Stroke Daughter   . Diabetes Maternal Grandmother   . Stroke Maternal Grandmother   . Stroke Father   . Arthritis Mother   . Breast cancer      niece   History  Substance Use Topics  . Smoking status: Never Smoker   . Smokeless tobacco: Never Used  .  Alcohol Use: No   OB History   Grav Para Term Preterm Abortions TAB SAB Ect Mult Living                 Review of Systems  Constitutional: Positive for diaphoresis. Negative for fever and chills.  HENT: Negative for congestion, tinnitus and trouble swallowing.   Eyes: Negative for visual disturbance.  Respiratory: Negative for cough and shortness of breath.   Cardiovascular: Positive for palpitations and syncope. Negative for chest pain and leg swelling.  Gastrointestinal: Positive for nausea. Negative for abdominal pain.  Genitourinary: Negative for flank pain and difficulty urinating.  Musculoskeletal: Positive for neck pain and neck  stiffness. Negative for back pain.  Skin: Negative for rash.  Neurological: Positive for syncope. Negative for dizziness, weakness, light-headedness and numbness.  All other systems reviewed and are negative.     Allergies  Aspirin  Home Medications   Prior to Admission medications   Medication Sig Start Date End Date Taking? Authorizing Provider  ALPRAZolam Duanne Moron) 0.25 MG tablet Take 0.25 mg by mouth daily as needed for anxiety.     Historical Provider, MD  amLODipine (NORVASC) 5 MG tablet Take 1 tablet (5 mg total) by mouth daily. 01/17/14   Birdie Riddle, MD  aspirin 81 MG tablet Take 81 mg by mouth daily.     Historical Provider, MD  benazepril (LOTENSIN) 20 MG tablet Take 20 mg by mouth daily.    Historical Provider, MD  glipiZIDE (GLUCOTROL XL) 2.5 MG 24 hr tablet Take 2.5 mg by mouth daily.    Historical Provider, MD  loratadine (CLARITIN) 10 MG tablet Take 10 mg by mouth daily as needed for allergies.     Historical Provider, MD  metoprolol tartrate (LOPRESSOR) 25 MG tablet Take 25 mg by mouth daily.     Historical Provider, MD  nitroGLYCERIN (NITROSTAT) 0.4 MG SL tablet Place 0.4 mg under the tongue every 5 (five) minutes as needed for chest pain.     Historical Provider, MD  omeprazole (PRILOSEC) 20 MG capsule Take 20 mg by mouth daily.    Historical Provider, MD  simvastatin (ZOCOR) 20 MG tablet Take 20 mg by mouth at bedtime.    Historical Provider, MD  travoprost, benzalkonium, (TRAVATAN) 0.004 % ophthalmic solution Place 1 drop into both eyes at bedtime.    Historical Provider, MD   BP 134/69  Pulse 64  Temp(Src) 98.1 F (36.7 C) (Oral)  Resp 16  SpO2 98% Physical Exam  Nursing note and vitals reviewed. Constitutional: She appears well-developed and well-nourished. No distress.  HENT:  Head: Normocephalic and atraumatic.  Eyes: EOM are normal. Pupils are equal, round, and reactive to light. No scleral icterus.  Neck: Normal range of motion. Neck supple. No  tracheal deviation present.  Cardiovascular: Normal rate, regular rhythm and intact distal pulses.   Pulmonary/Chest: Effort normal. No respiratory distress. She has no wheezes. She has no rales.  Abdominal: Soft. Bowel sounds are normal. There is no tenderness.  Musculoskeletal: She exhibits tenderness. She exhibits no edema.  Neurological: She is alert. She exhibits normal muscle tone. Coordination normal.  Skin: Skin is warm and dry. No rash noted. She is not diaphoretic.  Psychiatric: She has a normal mood and affect.    ED Course  Procedures (including critical care time) Labs Review Labs Reviewed  CBC WITH DIFFERENTIAL - Abnormal; Notable for the following:    WBC 11.0 (*)    All other components within normal limits  COMPREHENSIVE  METABOLIC PANEL - Abnormal; Notable for the following:    Potassium 3.5 (*)    Glucose, Bld 151 (*)    Albumin 3.4 (*)    GFR calc non Af Amer 58 (*)    GFR calc Af Amer 67 (*)    All other components within normal limits  URINALYSIS, ROUTINE W REFLEX MICROSCOPIC - Abnormal; Notable for the following:    APPearance CLOUDY (*)    Leukocytes, UA LARGE (*)    All other components within normal limits  URINE MICROSCOPIC-ADD ON - Abnormal; Notable for the following:    Casts HYALINE CASTS (*)    All other components within normal limits  I-STAT TROPOININ, ED  POC OCCULT BLOOD, ED    Imaging Review Dg Chest Port 1 View  05/01/2014   CLINICAL DATA:  Syncope.  Shortness of breath.  Hypertension.  EXAM: PORTABLE CHEST - 1 VIEW  COMPARISON:  09/22/2011 and 01/17/2014  FINDINGS: Numerous leads and wires project over the chest. Midline trachea. Normal heart size, with atherosclerosis in the transverse aorta. No pleural effusion or pneumothorax. Low lung volumes with resultant pulmonary interstitial prominence. Azygos fissure. Clear lungs.  IMPRESSION: Low lung volumes without acute disease.   Electronically Signed   By: Abigail Miyamoto M.D.   On: 05/01/2014  16:03     EKG Interpretation   Date/Time:  Monday May 01 2014 15:31:27 EDT Ventricular Rate:  68 PR Interval:  177 QRS Duration: 95 QT Interval:  443 QTC Calculation: 471 R Axis:   62 Text Interpretation:  Sinus rhythm Abnormal R-wave progression, early  transition Probable LVH with secondary repol abnrm Confirmed by [facility]  MD,  MICHEAL ([id number]) on 05/01/2014 3:43:07 PM        6:28 PM Pt feels improved, has ambulated in the ED without difficulty, work up here is unremarkable.  Troponin is normal, blood counts normal, hemoccult is negative.  Will d/c home, should not take any more of the combo antihypertensive here and discuss with Dr. Doylene Canard tomorrow.   Room air saturation is 98% and I interpret this to be adequate.   MDM   Final diagnoses:  Syncope  Medication side effect     Patient had a syncopal event with some prodrome. Patient had taken a new antihypertensive about 20 minutes prior to symptom onset. Patient did not have any documented seizure activity, no history of chest pain or back pain prior episode. Patient reports she is improved. Blood pressure is adequate, the patient has ambulated in the ED without any symptoms. Plan is to monitor here for a few hours, obtain routine blood tests, troponin, check stool for blood. I would recommend that the patient stop taking this new medicine for followup and discuss with Dr. Doylene Canard this week. No new ischemic changes on ECG.  No PE risk factors, not tachycardic or hypoxic.      Saddie Benders. Dorna Mai, MD 05/01/14 6045

## 2014-05-01 NOTE — Discharge Instructions (Signed)
Syncope Syncope is a fainting spell. This means the person loses consciousness and drops to the ground. The person is generally unconscious for less than 5 minutes. The person may have some muscle twitches for up to 15 seconds before waking up and returning to normal. Syncope occurs more often in elderly people, but it can happen to anyone. While most causes of syncope are not dangerous, syncope can be a sign of a serious medical problem. It is important to seek medical care.  CAUSES  Syncope is caused by a sudden decrease in blood flow to the brain. The specific cause is often not determined. Factors that can trigger syncope include:  Taking medicines that lower blood pressure.  Sudden changes in posture, such as standing up suddenly.  Taking more medicine than prescribed.  Standing in one place for too long.  Seizure disorders.  Dehydration and excessive exposure to heat.  Low blood sugar (hypoglycemia).  Straining to have a bowel movement.  Heart disease, irregular heartbeat, or other circulatory problems.  Fear, emotional distress, seeing blood, or severe pain. SYMPTOMS  Right before fainting, you may:  Feel dizzy or lightheaded.  Feel nauseous.  See all white or all black in your field of vision.  Have cold, clammy skin. DIAGNOSIS  Your caregiver will ask about your symptoms, perform a physical exam, and perform electrocardiography (ECG) to record the electrical activity of your heart. Your caregiver may also perform other heart or blood tests to determine the cause of your syncope. TREATMENT  In most cases, no treatment is needed. Depending on the cause of your syncope, your caregiver may recommend changing or stopping some of your medicines. HOME CARE INSTRUCTIONS  Have someone stay with you until you feel stable.  Do not drive, operate machinery, or play sports until your caregiver says it is okay.  Keep all follow-up appointments as directed by your  caregiver.  Lie down right away if you start feeling like you might faint. Breathe deeply and steadily. Wait until all the symptoms have passed.  Drink enough fluids to keep your urine clear or pale yellow.  If you are taking blood pressure or heart medicine, get up slowly, taking several minutes to sit and then stand. This can reduce dizziness. SEEK IMMEDIATE MEDICAL CARE IF:   You have a severe headache.  You have unusual pain in the chest, abdomen, or back.  You are bleeding from the mouth or rectum, or you have black or tarry stool.  You have an irregular or very fast heartbeat.  You have pain with breathing.  You have repeated fainting or seizure-like jerking during an episode.  You faint when sitting or lying down.  You have confusion.  You have difficulty walking.  You have severe weakness.  You have vision problems. If you fainted, call your local emergency services (911 in [place] Do not drive yourself to the hospital.  MAKE SURE YOU:  Understand these instructions.  Will watch your condition.  Will get help right away if you are not doing well or get worse. Document Released: 12/15/2005 Document Revised: 06/15/2012 Document Reviewed: 02/13/2012 [facility] Patient Information 2014 [place].    Skip your blood pressure medication tonight, do not take further doses of your new blood pressure medication and follow up with Dr. Doylene Canard in the morning.

## 2014-05-01 NOTE — ED Notes (Signed)
I gave the patient a cup of ice and a diet ginger-ale.

## 2014-07-14 ENCOUNTER — Ambulatory Visit: Payer: Self-pay

## 2014-07-25 ENCOUNTER — Ambulatory Visit: Payer: Medicare Other

## 2014-07-25 VITALS — BP 162/86 | HR 69 | Resp 12

## 2014-07-25 DIAGNOSIS — M79609 Pain in unspecified limb: Secondary | ICD-10-CM

## 2014-07-25 DIAGNOSIS — B351 Tinea unguium: Secondary | ICD-10-CM

## 2014-07-25 DIAGNOSIS — M79606 Pain in leg, unspecified: Secondary | ICD-10-CM

## 2014-07-25 DIAGNOSIS — L600 Ingrowing nail: Secondary | ICD-10-CM

## 2014-07-25 DIAGNOSIS — E119 Type 2 diabetes mellitus without complications: Secondary | ICD-10-CM

## 2014-07-25 MED ORDER — CEPHALEXIN 500 MG PO CAPS: 500.0000 mg | ORAL_CAPSULE | Freq: Three times a day (TID) | ORAL | Status: DC

## 2014-07-25 NOTE — Progress Notes (Signed)
   Subjective:    Patient ID: Heidi Wolf, female    DOB: [birth year], 73 y.o.   MRN: [id number]  HPI  PT STATED  'RT FOOT 1ST, 3RD TOENAIL AND LT FOOT 2ND TOENAIL ARE SORE, THICK, AND HAVE DISCOLORATION FOR 3 MONTHS.  THE TOENAILS ARE GETTING WORSE AND THICKER. THE TOENAILS GET AGGRAVATED BY PRESSURE AND WEARING CERTAIN TYPE OF SHOES. TRIED NO TREATMENT.    Review of Systems  HENT: Positive for sinus pressure.   Eyes: Positive for visual disturbance.  Musculoskeletal: Positive for back pain.  Skin: Positive for color change.  All other systems reviewed and are negative.      Objective:   Physical Exam 73 year old [place] female well-developed well-nourished oriented x3 presents at this time with main concerns about thick discolored and painful nails third toe right second toe left patient previously had partial nail excision of the right hallux still some tenderness occasionally with most likely arthrosis and scar tissue no active signs of infection noted no discharge or drainage noted  Lower extremity objective findings as follows vascular status is intact with pedal pulses palpable DP and PT +2/4 bilateral capillary refill time 3 seconds all digits. Epicritic and proprioceptive sensations intact and symmetric bilateral. There is normal plantar response DTRs not elicited dermatologically skin color pigment normal hair growth absent nails criptotic incurvated darkened and discolored and painful and tender third right second left. There is also diffuse keratoses sub-third fourth metatarsal right with possible verruca or porokeratosis been present in the addressed at a future date if needed. The right hallux also shows no signs of irritation may just be shoe irritation or scar tissue. 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 Patient how the procedures well was recommended Tylenol as needed for pain prescriptions for cephalexin 40 to [facility] pharmacy. Recheck in 2 weeks for followup for nail check instructed in daily soaking as instructed and Betadine warm water or Epsom salts next  Harriet Masson DPM

## 2014-07-25 NOTE — Patient Instructions (Signed)

## 2014-08-08 ENCOUNTER — Ambulatory Visit (INDEPENDENT_AMBULATORY_CARE_PROVIDER_SITE_OTHER): Payer: Medicare Other

## 2014-08-08 VITALS — BP 110/64 | HR 74 | Resp 17

## 2014-08-08 DIAGNOSIS — Z09 Encounter for follow-up examination after completed treatment for conditions other than malignant neoplasm: Secondary | ICD-10-CM

## 2014-08-08 DIAGNOSIS — B351 Tinea unguium: Secondary | ICD-10-CM

## 2014-08-08 DIAGNOSIS — L600 Ingrowing nail: Secondary | ICD-10-CM

## 2014-08-08 NOTE — Patient Instructions (Signed)
ANTIBACTERIAL SOAP INSTRUCTIONS  THE DAY AFTER PROCEDURE  Please follow the instructions your doctor has marked.   Shower as usual. Before getting out, place a drop of antibacterial liquid soap (Dial) on a wet, clean washcloth.  Gently wipe washcloth over affected area.  Afterward, rinse the area with warm water.  Blot the area dry with a soft cloth and cover with antibiotic ointment (neosporin, polysporin, bacitracin) and band aid or gauze and tape  Place 3-4 drops of antibacterial liquid soap in a quart of warm tap water.  Submerge foot into water for 20 minutes.  If bandage was applied after your procedure, leave on to allow for easy lift off, then remove and continue with soak for the remaining time.  Next, blot area dry with a soft cloth and cover with a bandage.  Apply other medications as directed by your doctor, such as cortisporin otic solution (eardrops) or neosporin antibiotic ointment  Wash and dry the toes daily with soap and water as instructed keep Neosporin Band-Aid on during the day we've the Band-Aid off at night when it air dry followup if not completely healed within the next 3 weeks

## 2014-08-08 NOTE — Progress Notes (Signed)
   Subjective:    Patient ID: Theophilus Bones, female    DOB: 1941-12-14, 73 y.o.   MRN: [id number]  HPI  Pt presents for recheck of nails that were removed, 3rd right and 2nd left, she has been doing soaks as directed and applying antibiotic ointment with bandaid. No drainage noted, no s/s of infection, very mild pain and mild swelling  Review of Systems no new findings or systemic changes noted     Objective:   Physical Exam Lower extremity objective findings reveal neurovascular status is intact pedal pulses palpable epicritic sensations diminished on Semmes Weinstein testing otherwise unremarkable both as a second left and third right are healing well following nail procedure mild serous drainage no purulence no excessive pain and apply Neosporin Band-Aid dressings as instructed. Has been soaking Betadine at this time we'll discontinue Betadine switch to soap and water Neosporin and Band-Aid       Assessment & Plan:  Assessment good postop progress fungal nail excisions first and third toenail left and second right her right first and third second left patient is doing well no discomfort no pain tenderness noted as Band-Aid on during daily or try at night discharge to an as-needed basis for followup and has not healed in 3 weeks we'll followup as needed next  Harriet Masson DPM

## 2014-08-29 ENCOUNTER — Ambulatory Visit (INDEPENDENT_AMBULATORY_CARE_PROVIDER_SITE_OTHER): Payer: Medicare Other

## 2014-08-29 VITALS — BP 178/98 | HR 72 | Temp 98.4°F | Resp 15

## 2014-08-29 DIAGNOSIS — Z09 Encounter for follow-up examination after completed treatment for conditions other than malignant neoplasm: Secondary | ICD-10-CM

## 2014-08-29 DIAGNOSIS — B351 Tinea unguium: Secondary | ICD-10-CM

## 2014-08-29 DIAGNOSIS — L600 Ingrowing nail: Secondary | ICD-10-CM

## 2014-08-29 NOTE — Progress Notes (Signed)
   Subjective:    Patient ID: Heidi Wolf, female    DOB: Jun 26, 1941, 73 y.o.   MRN: [id number]  HPI Comments: Pt states she has just ben using Dial soap cleanses and only using the the antibiotic cream and bandaid through the day, and only the cream at night.  Pt states she continues to have discomfort and darkness of the toes near the toenail bed.     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 Again will continue with just normal bathing and soap and water apply Neosporin and Band-Aid during the day where dry at night       Assessment & Plan:  Assessment slow healing with contusion of nail beds and nail. On AP nail procedure of lesser digits. Third right and second left no signs of infection no dehiscence no worsening of the wound appears to be stabilizing although slowly based on patient's agents the roughly slow healing suggested possible one month followup if not resolved again maintain dressing during the day Neosporin and Band-Aid with air dry a night  Harriet Masson DPM

## 2014-08-29 NOTE — Patient Instructions (Signed)
ANTIBACTERIAL SOAP INSTRUCTIONS  THE DAY AFTER PROCEDURE  Please follow the instructions your doctor has marked.   Shower as usual. Before getting out, place a drop of antibacterial liquid soap (Dial) on a wet, clean washcloth.  Gently wipe washcloth over affected area.  Afterward, rinse the area with warm water.  Blot the area dry with a soft cloth and cover with antibiotic ointment (neosporin, polysporin, bacitracin) and band aid or gauze and tape  Place 3-4 drops of antibacterial liquid soap in a quart of warm tap water.  Submerge foot into water for 20 minutes.  If bandage was applied after your procedure, leave on to allow for easy lift off, then remove and continue with soak for the remaining time.  Next, blot area dry with a soft cloth and cover with a bandage.  Apply other medications as directed by your doctor, such as cortisporin otic solution (eardrops) or neosporin antibiotic ointment  Wash foot with normal soap and water it does resume normal bathing or shower. After drying well continue to apply either Neosporin or Polysporin and a Band-Aid during the day. We've the Band-Aid off at night along the nailbed to dry

## 2014-09-21 ENCOUNTER — Other Ambulatory Visit: Payer: Self-pay | Admitting: Cardiovascular Disease

## 2014-09-21 ENCOUNTER — Observation Stay (HOSPITAL_COMMUNITY)
Admission: AD | Admit: 2014-09-21 | Discharge: 2014-09-23 | Disposition: A | Discharge status: Home / Self Care | Payer: Medicare Other | Source: Ambulatory Visit | Attending: Cardiovascular Disease | Admitting: Cardiovascular Disease

## 2014-09-21 ENCOUNTER — Ambulatory Visit
Admission: RE | Admit: 2014-09-21 | Discharge: 2014-09-21 | Disposition: A | Discharge status: Home / Self Care | Payer: Medicare Other | Source: Ambulatory Visit | Attending: Cardiovascular Disease | Admitting: Cardiovascular Disease

## 2014-09-21 ENCOUNTER — Encounter (HOSPITAL_COMMUNITY): Payer: Self-pay | Admitting: General Practice

## 2014-09-21 ENCOUNTER — Observation Stay (HOSPITAL_COMMUNITY): Payer: Medicare Other

## 2014-09-21 DIAGNOSIS — M129 Arthropathy, unspecified: Secondary | ICD-10-CM | POA: Diagnosis not present

## 2014-09-21 DIAGNOSIS — F411 Generalized anxiety disorder: Secondary | ICD-10-CM | POA: Insufficient documentation

## 2014-09-21 DIAGNOSIS — M542 Cervicalgia: Secondary | ICD-10-CM

## 2014-09-21 DIAGNOSIS — Z79899 Other long term (current) drug therapy: Secondary | ICD-10-CM | POA: Insufficient documentation

## 2014-09-21 DIAGNOSIS — E7800 Pure hypercholesterolemia, unspecified: Secondary | ICD-10-CM | POA: Diagnosis not present

## 2014-09-21 DIAGNOSIS — D649 Anemia, unspecified: Secondary | ICD-10-CM | POA: Diagnosis not present

## 2014-09-21 DIAGNOSIS — H409 Unspecified glaucoma: Secondary | ICD-10-CM | POA: Insufficient documentation

## 2014-09-21 DIAGNOSIS — M4712 Other spondylosis with myelopathy, cervical region: Secondary | ICD-10-CM | POA: Diagnosis not present

## 2014-09-21 DIAGNOSIS — M5090 Cervical disc disorder, unspecified, unspecified cervical region: Secondary | ICD-10-CM | POA: Diagnosis present

## 2014-09-21 DIAGNOSIS — Z7982 Long term (current) use of aspirin: Secondary | ICD-10-CM | POA: Insufficient documentation

## 2014-09-21 DIAGNOSIS — E119 Type 2 diabetes mellitus without complications: Secondary | ICD-10-CM | POA: Insufficient documentation

## 2014-09-21 DIAGNOSIS — Z860100 Personal history of colon polyps, unspecified: Secondary | ICD-10-CM | POA: Insufficient documentation

## 2014-09-21 DIAGNOSIS — M5000 Cervical disc disorder with myelopathy, unspecified cervical region: Secondary | ICD-10-CM | POA: Diagnosis not present

## 2014-09-21 DIAGNOSIS — I10 Essential (primary) hypertension: Secondary | ICD-10-CM | POA: Insufficient documentation

## 2014-09-21 HISTORY — DX: Gastro-esophageal reflux disease without esophagitis: K21.9

## 2014-09-21 HISTORY — DX: Type 2 diabetes mellitus without complications: E11.9

## 2014-09-21 LAB — GLUCOSE, CAPILLARY: Glucose-Capillary: 108 mg/dL — ABNORMAL HIGH (ref 70–99)

## 2014-09-21 LAB — COMPREHENSIVE METABOLIC PANEL
ALT: 15 U/L (ref 0–35)
AST: 22 U/L (ref 0–37)
Albumin: 3.9 g/dL (ref 3.5–5.2)
Alkaline Phosphatase: 62 U/L (ref 39–117)
Anion gap: 14 (ref 5–15)
BUN: 15 mg/dL (ref 6–23)
CO2: 26 mEq/L (ref 19–32)
Calcium: 9.6 mg/dL (ref 8.4–10.5)
Chloride: 100 mEq/L (ref 96–112)
Creatinine, Ser: 0.93 mg/dL (ref 0.50–1.10)
GFR calc Af Amer: 69 mL/min — ABNORMAL LOW (ref >90)
GFR calc non Af Amer: 60 mL/min — ABNORMAL LOW (ref >90)
Glucose, Bld: 84 mg/dL (ref 70–99)
Potassium: 3.9 mEq/L (ref 3.7–5.3)
Sodium: 140 mEq/L (ref 137–147)
Total Bilirubin: 0.4 mg/dL (ref 0.3–1.2)
Total Protein: 7.9 g/dL (ref 6.0–8.3)

## 2014-09-21 LAB — CBC WITH DIFFERENTIAL/PLATELET
Basophils Absolute: 0 10*3/uL (ref 0.0–0.1)
Basophils Relative: 0 % (ref 0–1)
Eosinophils Absolute: 0.1 10*3/uL (ref 0.0–0.7)
Eosinophils Relative: 1 % (ref 0–5)
HCT: 41.1 % (ref 36.0–46.0)
Hemoglobin: 13.7 g/dL (ref 12.0–15.0)
Lymphocytes Relative: 31 % (ref 12–46)
Lymphs Abs: 3.6 10*3/uL (ref 0.7–4.0)
MCH: 26.8 pg (ref 26.0–34.0)
MCHC: 33.3 g/dL (ref 30.0–36.0)
MCV: 80.3 fL (ref 78.0–100.0)
Monocytes Absolute: 0.9 10*3/uL (ref 0.1–1.0)
Monocytes Relative: 8 % (ref 3–12)
Neutro Abs: 6.9 10*3/uL (ref 1.7–7.7)
Neutrophils Relative %: 60 % (ref 43–77)
Platelets: 272 10*3/uL (ref 150–400)
RBC: 5.12 MIL/uL — ABNORMAL HIGH (ref 3.87–5.11)
RDW: 13.6 % (ref 11.5–15.5)
WBC: 11.5 10*3/uL — ABNORMAL HIGH (ref 4.0–10.5)

## 2014-09-21 LAB — CBC
HCT: 40 % (ref 36.0–46.0)
Hemoglobin: 13.5 g/dL (ref 12.0–15.0)
MCH: 27 pg (ref 26.0–34.0)
MCHC: 33.8 g/dL (ref 30.0–36.0)
MCV: 80 fL (ref 78.0–100.0)
Platelets: 254 10*3/uL (ref 150–400)
RBC: 5 MIL/uL (ref 3.87–5.11)
RDW: 13.6 % (ref 11.5–15.5)
WBC: 9.5 10*3/uL (ref 4.0–10.5)

## 2014-09-21 LAB — TROPONIN I: Troponin I: <0.3 ng/mL (ref <0.30)

## 2014-09-21 MED ORDER — ACETAMINOPHEN 325 MG PO TABS
650.0000 mg | ORAL_TABLET | Freq: Four times a day (QID) | ORAL | Status: DC | PRN
  Administered 2014-09-21 – 2014-09-23 (×4): 650 mg via ORAL
  Filled 2014-09-21 (×5): qty 2

## 2014-09-21 MED ORDER — ONDANSETRON HCL 4 MG/2ML IJ SOLN: 4.0000 mg | Freq: Four times a day (QID) | INTRAMUSCULAR | Status: DC | PRN

## 2014-09-21 MED ORDER — ALUM & MAG HYDROXIDE-SIMETH 200-200-20 MG/5ML PO SUSP: 30.0000 mL | Freq: Four times a day (QID) | ORAL | Status: DC | PRN

## 2014-09-21 MED ORDER — INFLUENZA VAC SPLIT QUAD 0.5 ML IM SUSY
0.5000 mL | PREFILLED_SYRINGE | INTRAMUSCULAR | Status: DC
  Filled 2014-09-21: qty 0.5

## 2014-09-21 MED ORDER — ADULT MULTIVITAMIN W/MINERALS CH
1.0000 | ORAL_TABLET | Freq: Every day | ORAL | Status: DC
  Administered 2014-09-22 – 2014-09-23 (×2): 1 via ORAL
  Filled 2014-09-21 (×2): qty 1

## 2014-09-21 MED ORDER — SODIUM CHLORIDE 0.9 % IV SOLN: 250.0000 mL | INTRAVENOUS | Status: DC | PRN

## 2014-09-21 MED ORDER — INSULIN ASPART 100 UNIT/ML ~~LOC~~ SOLN: 0.0000 [IU] | Freq: Three times a day (TID) | SUBCUTANEOUS | Status: DC

## 2014-09-21 MED ORDER — BENAZEPRIL HCL 20 MG PO TABS
20.0000 mg | ORAL_TABLET | Freq: Every day | ORAL | Status: DC
  Administered 2014-09-21 – 2014-09-23 (×3): 20 mg via ORAL
  Filled 2014-09-21 (×3): qty 1

## 2014-09-21 MED ORDER — ACETAMINOPHEN 650 MG RE SUPP: 650.0000 mg | Freq: Four times a day (QID) | RECTAL | Status: DC | PRN

## 2014-09-21 MED ORDER — SODIUM CHLORIDE 0.9 % IJ SOLN
3.0000 mL | Freq: Two times a day (BID) | INTRAMUSCULAR | Status: DC
  Administered 2014-09-21 – 2014-09-22 (×3): 3 mL via INTRAVENOUS

## 2014-09-21 MED ORDER — NITROGLYCERIN 0.4 MG SL SUBL: 0.4000 mg | SUBLINGUAL_TABLET | SUBLINGUAL | Status: DC | PRN

## 2014-09-21 MED ORDER — METOPROLOL TARTRATE 12.5 MG HALF TABLET
12.5000 mg | ORAL_TABLET | Freq: Two times a day (BID) | ORAL | Status: DC
  Administered 2014-09-21 – 2014-09-23 (×4): 12.5 mg via ORAL
  Filled 2014-09-21 (×5): qty 1

## 2014-09-21 MED ORDER — AMLODIPINE BESYLATE 5 MG PO TABS
5.0000 mg | ORAL_TABLET | Freq: Every day | ORAL | Status: DC
  Administered 2014-09-21 – 2014-09-23 (×3): 5 mg via ORAL
  Filled 2014-09-21 (×3): qty 1

## 2014-09-21 MED ORDER — HEPARIN SODIUM (PORCINE) 5000 UNIT/ML IJ SOLN
5000.0000 [IU] | Freq: Three times a day (TID) | INTRAMUSCULAR | Status: DC
  Administered 2014-09-21 – 2014-09-23 (×5): 5000 [IU] via SUBCUTANEOUS
  Filled 2014-09-21 (×8): qty 1

## 2014-09-21 MED ORDER — LATANOPROST 0.005 % OP SOLN
1.0000 [drp] | Freq: Every day | OPHTHALMIC | Status: DC
  Administered 2014-09-21 – 2014-09-22 (×2): 1 [drp] via OPHTHALMIC
  Filled 2014-09-21: qty 2.5

## 2014-09-21 MED ORDER — SODIUM CHLORIDE 0.9 % IJ SOLN: 3.0000 mL | Freq: Two times a day (BID) | INTRAMUSCULAR | Status: DC

## 2014-09-21 MED ORDER — ONDANSETRON HCL 4 MG PO TABS: 4.0000 mg | ORAL_TABLET | Freq: Four times a day (QID) | ORAL | Status: DC | PRN

## 2014-09-21 MED ORDER — LORATADINE 10 MG PO TABS
10.0000 mg | ORAL_TABLET | Freq: Every day | ORAL | Status: DC | PRN
  Filled 2014-09-21: qty 1

## 2014-09-21 MED ORDER — SODIUM CHLORIDE 0.9 % IJ SOLN: 3.0000 mL | INTRAMUSCULAR | Status: DC | PRN

## 2014-09-21 MED ORDER — ALPRAZOLAM 0.5 MG PO TABS: 0.5000 mg | ORAL_TABLET | Freq: Three times a day (TID) | ORAL | Status: DC | PRN

## 2014-09-21 MED ORDER — HYDROCODONE-ACETAMINOPHEN 5-325 MG PO TABS: 1.0000 | ORAL_TABLET | ORAL | Status: DC | PRN

## 2014-09-21 MED ORDER — GLIPIZIDE ER 2.5 MG PO TB24
2.5000 mg | ORAL_TABLET | Freq: Every day | ORAL | Status: DC
  Administered 2014-09-22 – 2014-09-23 (×2): 2.5 mg via ORAL
  Filled 2014-09-21 (×3): qty 1

## 2014-09-21 MED ORDER — DOCUSATE SODIUM 100 MG PO CAPS
100.0000 mg | ORAL_CAPSULE | Freq: Two times a day (BID) | ORAL | Status: DC
  Administered 2014-09-22 – 2014-09-23 (×3): 100 mg via ORAL
  Filled 2014-09-21 (×5): qty 1

## 2014-09-21 MED ORDER — PANTOPRAZOLE SODIUM 40 MG PO TBEC
40.0000 mg | DELAYED_RELEASE_TABLET | Freq: Every day | ORAL | Status: DC
  Administered 2014-09-22 – 2014-09-23 (×2): 40 mg via ORAL
  Filled 2014-09-21 (×2): qty 1

## 2014-09-21 NOTE — H&P (Signed)
Referring Physician:  LAWAN NANEZ is an 73 y.o. female.                       Chief Complaint: Left sided neck pain and left hand numbness.  HPI: 73 year old female with diabetes, type II, hypertension and hypercholesterolemia has left neck pain followed by weakness and numbness of left hand that started around 2 AM and improved gradually over 15 + hours. No chest pain or fever. C-spine x-ray showed mutiple level disc disease.   Past Medical History  Diagnosis Date  . Diabetes mellitus   . Hypertension   . Glaucoma   . Hyperlipidemia   . Arthritis   . Cataracts, both eyes   . Colon polyps     Hyperplastic 2003  . Internal hemorrhoid   . External hemorrhoid   . Personal history of colonic polyps 03/03/2012  . Anemia   . Anxiety       Past Surgical History  Procedure Laterality Date  . Tubal ligation  1971  . Cholecystectomy    . Abdominal hysterectomy    . Colonoscopy      Family History  Problem Relation Age of Onset  . Colon cancer Neg Hx   . Stomach cancer Neg Hx   . Heart disease Mother   . Diabetes Mother   . Arthritis Mother   . Heart disease Brother   . Heart disease Sister     x 2  . Stroke Brother   . Stroke Daughter   . Diabetes Maternal Grandmother   . Stroke Maternal Grandmother   . Stroke Father   . Breast cancer      niece   Social History:  reports that she has never smoked. She has never used smokeless tobacco. She reports that she does not drink alcohol or use illicit drugs.  Allergies:  Allergies  Allergen Reactions  . Aspirin Other (See Comments)    Makes stomach burn    Medications Prior to Admission  Medication Sig Dispense Refill  . amLODipine (NORVASC) 5 MG tablet Take 1 tablet (5 mg total) by mouth daily.  30 tablet  1  . aspirin 81 MG tablet Take 81 mg by mouth every other day.       . benazepril (LOTENSIN) 20 MG tablet Take 20 mg by mouth daily.      . cephALEXin (KEFLEX) 500 MG capsule Take 1 capsule (500 mg total) by mouth 3  (three) times daily.  30 capsule  0  . cholecalciferol (VITAMIN D) 1000 UNITS tablet Take 1,000 Units by mouth daily.      Marland Kitchen glipiZIDE (GLUCOTROL XL) 2.5 MG 24 hr tablet Take 2.5 mg by mouth daily.      Marland Kitchen loratadine (CLARITIN) 10 MG tablet Take 10 mg by mouth daily as needed for allergies.       Marland Kitchen LUMIGAN 0.01 % SOLN Place 1 drop into both eyes at bedtime.       . metoprolol tartrate (LOPRESSOR) 25 MG tablet Take 12.5 mg by mouth 2 (two) times daily.       . nitroGLYCERIN (NITROSTAT) 0.4 MG SL tablet Place 0.4 mg under the tongue every 5 (five) minutes as needed for chest pain.       Marland Kitchen omeprazole (PRILOSEC) 20 MG capsule Take 20 mg by mouth daily.      . simvastatin (ZOCOR) 20 MG tablet Take 20 mg by mouth at bedtime.  No results found for this or any previous visit (from the past 48 hour(s)). Dg Cervical Spine Complete  09/21/2014   CLINICAL DATA:  Pain.  EXAM: CERVICAL SPINE  4+ VIEWS  COMPARISON:  None.  FINDINGS: Soft tissue structures are unremarkable . Diffuse multilevel degenerative disc disease noted. Prominent osteophytes noted C5-C6 and C6-C7. Diffuse facet hypertrophy is present. Mild multifocal bilateral neural foraminal narrowing present. No evidence of fracture or dislocation. Pulmonary apices are clear. Carotid atherosclerotic vascular calcification.  IMPRESSION: 1. Diffuse degenerative change.  No acute abnormality. 2. Carotid atherosclerotic vascular disease.   Electronically Signed   By: Marcello Moores  Register   On: 09/21/2014 10:30    Review Of Systems Constitutional: Positive for appetite change. Negative for fever and chills.  HENT: Negative for congestion.  Eyes: Negative for visual disturbance.  Respiratory: Negative for shortness of breath.  Cardiovascular: Positive for chest pain. Negative for leg swelling.  Gastrointestinal: Positive for nausea. Negative for vomiting and abdominal pain.  Genitourinary: Negative for dysuria and flank pain.  Musculoskeletal: Positive  for back pain, neck pain and neck stiffness.  Skin: Negative for rash.  Neurological: Negative for light-headedness and headaches.    Blood pressure 176/70, pulse 74, temperature 98.4 F (36.9 C), temperature source Oral, resp. rate 17, height 5\' 4"  (1.626 m), weight 76.476 kg (168 lb 9.6 oz), SpO2 100.00%.  GENERAL: The patient is short and averagely nourished black female in no acute distress.  HEENT: The patient is normocephalic, atraumatic. She wears glasses. Has brown eyes. Conjunctivae pink. Sclera-non-icteric.  NECK: No JVD. Positive faint bruit. Left sided tenderness on palpation LUNGS: Clear bilaterally.  HEART: Normal S1 and S2. III/VI systolic murmur.  ABDOMEN: Soft and nontender.  EXTREMITIES: No edema, cyanosis, or clubbing.  SKIN: Warm and dry.  NEUROLOGICAL: The patient moves all 4 extremities. Cranial nerves grossly intact. Speech is normal. Minimal weakness of left hand grip compared to normal right hand grip.  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 Neurosurgery consult IP or OP post MR C-spine.  Birdie Riddle, MD  09/21/2014, 5:55 PM

## 2014-09-22 DIAGNOSIS — M4712 Other spondylosis with myelopathy, cervical region: Secondary | ICD-10-CM | POA: Diagnosis not present

## 2014-09-22 LAB — TROPONIN I
Troponin I: <0.3 ng/mL (ref <0.30)
Troponin I: <0.3 ng/mL (ref <0.30)

## 2014-09-22 LAB — BASIC METABOLIC PANEL
Anion gap: 13 (ref 5–15)
BUN: 14 mg/dL (ref 6–23)
CO2: 27 mEq/L (ref 19–32)
Calcium: 9.4 mg/dL (ref 8.4–10.5)
Chloride: 103 mEq/L (ref 96–112)
Creatinine, Ser: 0.95 mg/dL (ref 0.50–1.10)
GFR calc Af Amer: 67 mL/min — ABNORMAL LOW (ref >90)
GFR calc non Af Amer: 58 mL/min — ABNORMAL LOW (ref >90)
Glucose, Bld: 93 mg/dL (ref 70–99)
Potassium: 3.9 mEq/L (ref 3.7–5.3)
Sodium: 143 mEq/L (ref 137–147)

## 2014-09-22 LAB — PROTIME-INR
INR: 1.04 (ref 0.00–1.49)
Prothrombin Time: 13.6 seconds (ref 11.6–15.2)

## 2014-09-22 LAB — GLUCOSE, CAPILLARY
Glucose-Capillary: 103 mg/dL — ABNORMAL HIGH (ref 70–99)
Glucose-Capillary: 81 mg/dL (ref 70–99)
Glucose-Capillary: 99 mg/dL (ref 70–99)

## 2014-09-22 LAB — HEMOGLOBIN A1C
Hgb A1c MFr Bld: 6.2 % — ABNORMAL HIGH (ref <5.7)
Mean Plasma Glucose: 131 mg/dL — ABNORMAL HIGH (ref <117)

## 2014-09-22 NOTE — Progress Notes (Signed)
Ref: Birdie Riddle, MD   Subjective:  Feeling better. Soft C-collar applied. Patient agrees to medical management for now. Appreciate neurosurgery consult.  Objective:  Vital Signs in the last 24 hours: Temp:  [98.5 F (36.9 C)-98.6 F (37 C)] 98.6 F (37 C) (09/25 1331) Pulse Rate:  [53-84] 61 (09/25 1331) Cardiac Rhythm:  [-] Normal sinus rhythm (09/25 1000) Resp:  [18] 18 (09/25 0437) BP: (130-164)/(70-80) 164/78 mmHg (09/25 1331) SpO2:  [99 %-100 %] 99 % (09/25 1331) Weight:  [75.841 kg (167 lb 3.2 oz)] 75.841 kg (167 lb 3.2 oz) (09/25 0437)  Physical Exam: BP Readings from Last 1 Encounters:  09/22/14 164/78    Wt Readings from Last 1 Encounters:  09/22/14 75.841 kg (167 lb 3.2 oz)    Weight change:   HEENT: [place]/AT, Eyes-Brown, PERL, EOMI, Conjunctiva-Pink, Sclera-Non-icteric Neck: Soft C-collar applied. No JVD, No bruit, Trachea midline. Lungs:  Clear, Bilateral. Cardiac:  Regular rhythm, normal S1 and S2, no S3.  Abdomen:  Soft, non-tender. Extremities:  No edema present. No cyanosis. No clubbing. CNS: AxOx3, Cranial nerves grossly intact, moves all 4 extremities.  Skin: Warm and dry.   Intake/Output from previous day: 09/24 0701 - 09/25 0700 In: 420 [P.O.:420] Out: 1000 [Urine:1000]    Lab Results: BMET    Component Value Date/Time   NA 143 09/21/2014 2340   NA 140 09/21/2014 2025   NA 143 05/01/2014 1532   K 3.9 09/21/2014 2340   K 3.9 09/21/2014 2025   K 3.5* 05/01/2014 1532   CL 103 09/21/2014 2340   CL 100 09/21/2014 2025   CL 103 05/01/2014 1532   CO2 27 09/21/2014 2340   CO2 26 09/21/2014 2025   CO2 25 05/01/2014 1532   GLUCOSE 93 09/21/2014 2340   GLUCOSE 84 09/21/2014 2025   GLUCOSE 151* 05/01/2014 1532   BUN 14 09/21/2014 2340   BUN 15 09/21/2014 2025   BUN 16 05/01/2014 1532   CREATININE 0.95 09/21/2014 2340   CREATININE 0.93 09/21/2014 2025   CREATININE 0.96 05/01/2014 1532   CALCIUM 9.4 09/21/2014 2340   CALCIUM 9.6 09/21/2014 2025   CALCIUM 9.2 05/01/2014  1532   GFRNONAA 58* 09/21/2014 2340   GFRNONAA 60* 09/21/2014 2025   GFRNONAA 58* 05/01/2014 1532   GFRAA 67* 09/21/2014 2340   GFRAA 69* 09/21/2014 2025   GFRAA 67* 05/01/2014 1532   CBC    Component Value Date/Time   WBC 9.5 09/21/2014 2340   RBC 5.00 09/21/2014 2340   HGB 13.5 09/21/2014 2340   HCT 40.0 09/21/2014 2340   PLT 254 09/21/2014 2340   MCV 80.0 09/21/2014 2340   MCH 27.0 09/21/2014 2340   MCHC 33.8 09/21/2014 2340   RDW 13.6 09/21/2014 2340   LYMPHSABS 3.6 09/21/2014 2025   MONOABS 0.9 09/21/2014 2025   EOSABS 0.1 09/21/2014 2025   BASOSABS 0.0 09/21/2014 2025   HEPATIC Function Panel  Recent Labs  01/17/14 0028 05/01/14 1532 09/21/14 2025  PROT 7.9 7.2 7.9   HEMOGLOBIN A1C No components found with this basename: HGA1C,  MPG   CARDIAC ENZYMES Lab Results  Component Value Date   CKTOTAL 223* 09/25/2011   CKMB 5.6* 09/25/2011   TROPONINI <0.30 09/22/2014   TROPONINI <0.30 09/21/2014   TROPONINI <0.30 09/21/2014   BNP No results found for this basename: PROBNP,  in the last 8760 hours TSH No results found for this basename: TSH,  in the last 8760 hours CHOLESTEROL  Recent Labs  01/17/14 0705  CHOL 187    Scheduled Meds: . amLODipine  5 mg Oral Daily  . benazepril  20 mg Oral Daily  . docusate sodium  100 mg Oral BID  . glipiZIDE  2.5 mg Oral QAC breakfast  . heparin  5,000 Units Subcutaneous 3 times per day  . Influenza vac split quadrivalent PF  0.5 mL Intramuscular Tomorrow-1000  . insulin aspart  0-9 Units Subcutaneous TID WC  . latanoprost  1 drop Both Eyes QHS  . metoprolol tartrate  12.5 mg Oral BID  . multivitamin with minerals  1 tablet Oral Daily  . pantoprazole  40 mg Oral Daily  . sodium chloride  3 mL Intravenous Q12H  . 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 Home in AM.    LOS: 1 day    Dixie Dials  MD  09/22/2014, 7:38 PM

## 2014-09-22 NOTE — Progress Notes (Signed)
Patient ID: Heidi Wolf, female   DOB: [birth year], 73 y.o.   MRN: [id number] To see me in 10 days after discharge. Tramadol and gabapentin given. The goal is to keep her away from surgery

## 2014-09-22 NOTE — Progress Notes (Signed)
UR completed 

## 2014-09-22 NOTE — Progress Notes (Signed)
09/22/14 1400  Clinical Encounter Type  Visited With Patient  Visit Type Initial;Spiritual support  Referral From Nurse  Spiritual Encounters  Spiritual Needs Prayer;Emotional  Stress Factors  Patient Stress Factors None identified   Chaplain visited with patient. Chaplain was referred to patient via [facility]. Patient was in good spirits and welcomed the company of a chaplain. Patient explained that she heard good medical news from her doctors earlier today and found out that she would not have to get surgery. Patient has a very strong faith rooted in Christianity. Patient attributed her good news and continued healing to the multiple people praying for her. Patient has a positive experience with hospital's chaplain staff in the past and cited many examples where other family members were provided care by chaplains. Chaplain will continue to provide emotional and spiritual support for patient as needed. Amere Iott, Claudius Sis, Chaplain 2:18 PM

## 2014-09-22 NOTE — Progress Notes (Signed)
Orthopedic Tech Progress Note Patient Details:  Heidi Wolf 12-15-1941 [id number] Applied soft cervical collar. Ortho Devices Type of Ortho Device: Soft collar Ortho Device/Splint Interventions: Application   Darrol Poke 09/22/2014, 5:30 PM

## 2014-09-22 NOTE — Consult Note (Signed)
Reason for Consult:left arm painReferring Physician: cardiology  Heidi Wolf is an 73 y.o. female.  VQQ:[id number] admited with left upper pain and weakness. Cardiac w/u was negative. Mri showed a c56 hnp  Past Medical History  Diagnosis Date  . Hypertension   . Glaucoma   . Hyperlipidemia   . Cataracts, both eyes   . Colon polyps     Hyperplastic 2003  . Internal hemorrhoid   . External hemorrhoid   . Personal history of colonic polyps 03/03/2012  . Anemia   . Heart murmur   . Type II diabetes mellitus   . GERD (gastroesophageal reflux disease)   . Arthritis     "right hip" (09/21/2014)  . Anxiety     Past Surgical History  Procedure Laterality Date  . Tubal ligation  1971  . Cholecystectomy    . Colonoscopy    . Abdominal hysterectomy    . Appendectomy      "w/gallbladder"  . Cardiac catheterization  X 2    Family History  Problem Relation Age of Onset  . Colon cancer Neg Hx   . Stomach cancer Neg Hx   . Heart disease Mother   . Diabetes Mother   . Arthritis Mother   . Heart disease Brother   . Heart disease Sister     x 2  . Stroke Brother   . Stroke Daughter   . Diabetes Maternal Grandmother   . Stroke Maternal Grandmother   . Stroke Father   . Breast cancer      niece    Social History:  reports that she has never smoked. She has never used smokeless tobacco. She reports that she does not drink alcohol or use illicit drugs. See hp [facility]er Results for orders placed during the hospital encounter of 09/21/14 (from the past 48 hour(s))  COMPREHENSIVE METABOLIC PANEL     Status: Abnormal   Collection Time    09/21/14  8:25 PM      Result Value Ref Range   Sodium 140  137 - 147 mEq/L   Potassium 3.9  3.7 - 5.3 mEq/L   Chloride 100  96 - 112 mEq/L   CO2 26  19 - 32 mEq/L   Glucose, Bld 84  70 - 99 mg/dL   BUN 15  6 - 23 mg/dL   Creatinine, Ser 0.93  0.50 - 1.10 mg/dL   Calcium 9.6  8.4 - 10.5 mg/dL   Total Protein 7.9  6.0 - 8.3 g/dL   Albumin 3.9  3.5 -  5.2 g/dL   AST 22  0 - 37 U/L   ALT 15  0 - 35 U/L   Alkaline Phosphatase 62  39 - 117 U/L   Total Bilirubin 0.4  0.3 - 1.2 mg/dL   GFR calc non Af Amer 60 (*) >90 mL/min   GFR calc Af Amer 69 (*) >90 mL/min   Comment: (NOTE)     The eGFR has been calculated using the CKD EPI equation.     This calculation has not been validated in all clinical situations.     eGFR's persistently <90 mL/min signify possible Chronic Kidney     Disease.   Anion gap 14  5 - 15  CBC WITH DIFFERENTIAL     Status: Abnormal   Collection Time    09/21/14  8:25 PM      Result Value Ref Range   WBC 11.5 (*) 4.0 - 10.5 K/uL   RBC 5.12 (*)  3.87 - 5.11 MIL/uL   Hemoglobin 13.7  12.0 - 15.0 g/dL   HCT 41.1  36.0 - 46.0 %   MCV 80.3  78.0 - 100.0 fL   MCH 26.8  26.0 - 34.0 pg   MCHC 33.3  30.0 - 36.0 g/dL   RDW 13.6  11.5 - 15.5 %   Platelets 272  150 - 400 K/uL   Neutrophils Relative % 60  43 - 77 %   Neutro Abs 6.9  1.7 - 7.7 K/uL   Lymphocytes Relative 31  12 - 46 %   Lymphs Abs 3.6  0.7 - 4.0 K/uL   Monocytes Relative 8  3 - 12 %   Monocytes Absolute 0.9  0.1 - 1.0 K/uL   Eosinophils Relative 1  0 - 5 %   Eosinophils Absolute 0.1  0.0 - 0.7 K/uL   Basophils Relative 0  0 - 1 %   Basophils Absolute 0.0  0.0 - 0.1 K/uL  HEMOGLOBIN A1C     Status: Abnormal   Collection Time    09/21/14  8:25 PM      Result Value Ref Range   Hemoglobin A1C 6.2 (*) <5.7 %   Comment: (NOTE)                                                                               According to the ADA Clinical Practice Recommendations for 2011, when     HbA1c is used as a screening test:      >=6.5%   Diagnostic of Diabetes Mellitus               (if abnormal result is confirmed)     5.7-6.4%   Increased risk of developing Diabetes Mellitus     References:Diagnosis and Classification of Diabetes Mellitus,Diabetes     ENID,7824,23(NTIRW 1):S62-S69 and Standards of Medical Care in             Diabetes - 2011,Diabetes Care,2011,34  (Suppl 1):S11-S61.   Mean Plasma Glucose 131 (*) <117 mg/dL   Comment: Performed at [facility]  TROPONIN I     Status: None   Collection Time    09/21/14  8:26 PM      Result Value Ref Range   Troponin I <0.30  <0.30 ng/mL   Comment:            Due to the release kinetics of cTnI,     a negative result within the first hours     of the onset of symptoms does not rule out     myocardial infarction with certainty.     If myocardial infarction is still suspected,     repeat the test at appropriate intervals.  GLUCOSE, CAPILLARY     Status: Abnormal   Collection Time    09/21/14 11:35 PM      Result Value Ref Range   Glucose-Capillary 108 (*) 70 - 99 mg/dL  BASIC METABOLIC PANEL     Status: Abnormal   Collection Time    09/21/14 11:40 PM      Result Value Ref Range   Sodium 143  137 - 147 mEq/L   Potassium 3.9  3.7 - 5.3 mEq/L   Chloride 103  96 - 112 mEq/L   CO2 27  19 - 32 mEq/L   Glucose, Bld 93  70 - 99 mg/dL   BUN 14  6 - 23 mg/dL   Creatinine, Ser 0.95  0.50 - 1.10 mg/dL   Calcium 9.4  8.4 - 10.5 mg/dL   GFR calc non Af Amer 58 (*) >90 mL/min   GFR calc Af Amer 67 (*) >90 mL/min   Comment: (NOTE)     The eGFR has been calculated using the CKD EPI equation.     This calculation has not been validated in all clinical situations.     eGFR's persistently <90 mL/min signify possible Chronic Kidney     Disease.   Anion gap 13  5 - 15  CBC     Status: None   Collection Time    09/21/14 11:40 PM      Result Value Ref Range   WBC 9.5  4.0 - 10.5 K/uL   RBC 5.00  3.87 - 5.11 MIL/uL   Hemoglobin 13.5  12.0 - 15.0 g/dL   HCT 40.0  36.0 - 46.0 %   MCV 80.0  78.0 - 100.0 fL   MCH 27.0  26.0 - 34.0 pg   MCHC 33.8  30.0 - 36.0 g/dL   RDW 13.6  11.5 - 15.5 %   Platelets 254  150 - 400 K/uL  PROTIME-INR     Status: None   Collection Time    09/21/14 11:40 PM      Result Value Ref Range   Prothrombin Time 13.6  11.6 - 15.2 seconds   INR 1.04  0.00 - 1.49  TROPONIN  I     Status: None   Collection Time    09/21/14 11:40 PM      Result Value Ref Range   Troponin I <0.30  <0.30 ng/mL   Comment:            Due to the release kinetics of cTnI,     a negative result within the first hours     of the onset of symptoms does not rule out     myocardial infarction with certainty.     If myocardial infarction is still suspected,     repeat the test at appropriate intervals.  GLUCOSE, CAPILLARY     Status: Abnormal   Collection Time    09/22/14  6:08 AM      Result Value Ref Range   Glucose-Capillary 103 (*) 70 - 99 mg/dL   Comment 1 Documented in Chart    TROPONIN I     Status: None   Collection Time    09/22/14  6:25 AM      Result Value Ref Range   Troponin I <0.30  <0.30 ng/mL   Comment:            Due to the release kinetics of cTnI,     a negative result within the first hours     of the onset of symptoms does not rule out     myocardial infarction with certainty.     If myocardial infarction is still suspected,     repeat the test at appropriate intervals.    Dg Cervical Spine Complete  09/21/2014   CLINICAL DATA:  Pain.  EXAM: CERVICAL SPINE  4+ VIEWS  COMPARISON:  None.  FINDINGS: Soft tissue structures are unremarkable . Diffuse multilevel degenerative disc disease noted. Prominent  osteophytes noted C5-C6 and C6-C7. Diffuse facet hypertrophy is present. Mild multifocal bilateral neural foraminal narrowing present. No evidence of fracture or dislocation. Pulmonary apices are clear. Carotid atherosclerotic vascular calcification.  IMPRESSION: 1. Diffuse degenerative change.  No acute abnormality. 2. Carotid atherosclerotic vascular disease.   Electronically Signed   By: Marcello Moores  Register   On: 09/21/2014 10:30   Mr Cervical Spine Wo Contrast  09/22/2014   CLINICAL DATA:  Neck pain and LEFT hand numbness. Numbness began today, gradually improving.  EXAM: MRI CERVICAL SPINE WITHOUT CONTRAST  TECHNIQUE: Multiplanar, multisequence MR imaging of the  cervical spine was performed. No intravenous contrast was administered.  COMPARISON:  Cervical spine radiograph September 21, 2014  FINDINGS: Cervical vertebral bodies are intact and aligned with maintenance of the cervical lordosis. Mild C5-6, moderate C6-7 disc height loss, decreased T2 signal within all cervical discs most consistent with mild to moderate desiccation. Moderate chronic discogenic endplate changes B1-6, X4-5 and mild at C4-5. No STIR signal abnormality to suggest acute osseous process.  Mild ventral cord deformity at C6-7 as described below. No MR findings of cervical spinal cord edema or syrinx. Craniocervical junction is intact. 17 mm intermediate T1, bright T2 lesion in the RIGHT anterior neck may reflect thyroid.  Level by level evaluation:  C2-3: 1-2 mm broad-based central disc protrusion without canal stenosis or neural foraminal narrowing. Very mild facet arthropathy.  C3-4: 2 mm broad-based disc bulge asymmetric to the LEFT. Uncovertebral hypertrophy and mild facet arthropathy. Mild canal stenosis. Moderate LEFT neural foraminal narrowing.  C4-5: Under 2 mm broad-based disc bulge. Uncovertebral hypertrophy and mild to moderate facet arthropathy. Mild canal stenosis. Mild LEFT neural foraminal narrowing.  C5-6: 2 mm broad-based disc bulge, uncovertebral hypertrophy and mild to moderate facet arthropathy. Mild canal stenosis. Mild RIGHT and mild-to-moderate LEFT neural foraminal narrowing.  C6-7: 5 mm broad-based LEFT central to subarticular disc protrusion with bright STIR signal suggesting acuity, this deforms the LEFT ventral spinal cord of results and mild to moderate canal stenosis. Moderate LEFT neural foraminal narrowing.  C7-T1: Annular bulging without canal stenosis or neural foraminal narrowing.  IMPRESSION: Acute appearing moderate LEFT central to subarticular C6-7 disc protrusion deforms the LEFT ventral spinal cord, and results in mild to moderate canal stenosis. No MR findings  of cervical spinal cord edema.  No acute fracture nor malalignment.  Cervical spondylosis. Mild canal stenosis C3-4 through C5-6. Neural foraminal narrowing C3-4 thru C6-7: Moderate on the LEFT at C3-4 and C6-7.  17 mm cystic lesion in RIGHT anterior neck may arise from the thyroid, consider thyroid sonogram on a nonemergent basis.   Electronically Signed   By: Elon Alas   On: 09/22/2014 00:01    Review of Systems  Gastrointestinal: Negative.   Genitourinary: Negative.   Musculoskeletal: Positive for neck pain.  Skin: Negative.   Neurological: Positive for sensory change and focal weakness. Negative for tingling.  Endo/Heme/Allergies: Negative.   Psychiatric/Behavioral: Negative.    Blood pressure 130/80, pulse 84, temperature 98.5 F (36.9 C), temperature source Oral, resp. rate 18, height 5' 4" (1.626 m), weight 75.841 kg (167 lb 3.2 oz), SpO2 100.00%. Physical Examhent, nl. Neck, some pain with mobility. Cv, nl. Lungs, clear. Abdomen, soft. Extremities nl. NEURO mild weakness of left biceps. Sensory, nl dtr, wnl. Mri cervical shows ddd but at c56 to the left there is a hnp  Assessment/Plan: Since she is better we are going to keep her away from surgery. i will be back to talk with  her aned her family  BOTERO,ERNESTO M 09/22/2014, 12:58 PM

## 2014-09-23 DIAGNOSIS — M4712 Other spondylosis with myelopathy, cervical region: Secondary | ICD-10-CM | POA: Diagnosis not present

## 2014-09-23 LAB — GLUCOSE, CAPILLARY: Glucose-Capillary: 116 mg/dL — ABNORMAL HIGH (ref 70–99)

## 2014-09-23 MED ORDER — TRAMADOL HCL 50 MG PO TABS: 50.0000 mg | ORAL_TABLET | Freq: Two times a day (BID) | ORAL | Status: DC

## 2014-09-23 NOTE — Discharge Summary (Signed)
Physician Discharge Summary  Patient ID: Heidi Wolf MRN: [id number] DOB/AGE: November 23, 1941 73 y.o.  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 No chest pain or fever. C-spine x-ray showed mutiple level disc disease. MRI of C-spine showed C 5-6 hnp. Dr. Leeroy Cha, a neurosurgeon was consulted. Patient will be treated medically for with analgesics and soft C-collar. She will be followed by Dr. Joya Salm in 10 days.    Consults: Neurosurgery  Significant Diagnostic Studies: labs: Normal CBC and BMET. Normal cardiac enzymes x 3.  MRI C-spine:Acute appearing moderate LEFT central to subarticular C6-7 disc protrusion deforms the LEFT ventral spinal cord, and results in mild to moderate canal stenosis. No MR findings of cervical spinal cord edema.  No acute fracture nor malalignment.  Cervical spondylosis. Mild canal stenosis C3-4 through C5-6. Neural foraminal narrowing C3-4 thru C6-7: Moderate on the LEFT at C3-4 and C6-7.  17 mm cystic lesion in RIGHT anterior neck may arise from the thyroid, consider thyroid sonogram on a nonemergent basis.  Treatments: Analgesics and soft C collar.  Discharge Exam: Blood pressure 155/67, pulse 60, temperature 97.9 F (36.6 C), temperature source Oral, resp. rate 17, height 5\' 4"  (1.626 m), weight 75.66 kg (166 lb 12.8 oz), SpO2  98.00%.  GENERAL: The patient is short and averagely nourished black female in no acute distress.  HEENT: The patient is normocephalic, atraumatic. She wears glasses. Has brown eyes. Conjunctivae pink. Sclera-non-icteric.  NECK: No JVD. Positive faint bruit. Left sided tenderness on palpation  LUNGS: Clear bilaterally.  HEART: Normal S1 and S2. III/VI systolic murmur.  ABDOMEN: Soft and nontender.  EXTREMITIES: No edema, cyanosis, or clubbing.  SKIN: Warm and dry.  NEUROLOGICAL: The patient moves all 4 extremities. Cranial nerves grossly intact. Speech is normal. Minimal weakness of left hand grip compared to normal right hand grip.  Disposition: 01-Home or Self Care     Medication List         amLODipine 5 MG tablet  Commonly known as:  NORVASC  Take 1 tablet (5 mg total) by mouth daily.     aspirin EC 81 MG tablet  Take 81 mg by mouth every other day.     benazepril 20 MG tablet  Commonly known as:  LOTENSIN  Take 20 mg by mouth daily.     cholecalciferol 1000 UNITS tablet  Commonly known as:  VITAMIN D  Take 1,000 Units by mouth daily.     glipiZIDE 2.5 MG 24 hr tablet  Commonly known as:  GLUCOTROL XL  Take 2.5 mg by mouth daily.     loratadine 10 MG tablet  Commonly known as:  CLARITIN  Take 10 mg by mouth daily as needed for allergies.     LUMIGAN 0.01 % Soln  Generic drug:  bimatoprost  Place 1 drop into both eyes at bedtime.     metoprolol tartrate 25 MG tablet  Commonly known as:  LOPRESSOR  Take 25 mg by mouth daily.     nitroGLYCERIN 0.4 MG SL tablet  Commonly known as:  NITROSTAT  Place 0.4 mg under the tongue every 5 (five) minutes as needed for chest pain.     omeprazole 20 MG capsule  Commonly known as:  PRILOSEC  Take 20 mg by mouth daily as needed (for acid reflux).     simvastatin 20 MG tablet  Commonly known as:  ZOCOR  Take 20 mg by mouth at bedtime.     traMADol 50 MG tablet  Commonly known as:  ULTRAM  Take 1 tablet (50 mg total)  by mouth 2 (two) times daily.           Follow-up Information   Follow up with [facility] S, MD. Schedule an appointment as soon as possible for a visit in 1 month.   Specialty:  Cardiology   Contact information:   [address] [place] [address] [phone]       Follow up with Floyce Stakes, MD In 10 days. (as arranged)    Specialty:  Neurosurgery   Contact information:   [address] [place] [address] [phone]       Signed: Birdie Riddle 09/23/2014, 9:32 AM

## 2014-09-23 NOTE — Progress Notes (Signed)
09/23/2014 10:41 AM Nursing note Discharge avs form, medications already taken today and those due this evening given and explained to patient and family. rx already given to patient by Dr. Joya Salm. Dr. Doylene Canard aware. Follow up appointments and when to call MD reviewed. D/c iv. D/c tele. D/c home per orders.  Custer Pimenta, Arville Lime

## 2014-10-11 ENCOUNTER — Other Ambulatory Visit: Payer: Self-pay

## 2014-10-11 DIAGNOSIS — Z1231 Encounter for screening mammogram for malignant neoplasm of breast: Secondary | ICD-10-CM

## 2014-11-13 ENCOUNTER — Ambulatory Visit
Admission: RE | Admit: 2014-11-13 | Discharge: 2014-11-13 | Disposition: A | Discharge status: Home / Self Care | Payer: Medicare Other | Source: Ambulatory Visit

## 2014-11-13 DIAGNOSIS — Z1231 Encounter for screening mammogram for malignant neoplasm of breast: Secondary | ICD-10-CM

## 2015-04-27 ENCOUNTER — Encounter: Payer: Self-pay | Admitting: Gastroenterology

## 2015-10-15 ENCOUNTER — Other Ambulatory Visit: Payer: Self-pay

## 2015-10-15 DIAGNOSIS — Z1231 Encounter for screening mammogram for malignant neoplasm of breast: Secondary | ICD-10-CM

## 2015-11-15 ENCOUNTER — Ambulatory Visit
Admission: RE | Admit: 2015-11-15 | Discharge: 2015-11-15 | Disposition: A | Discharge status: Home / Self Care | Payer: Medicare Other | Source: Ambulatory Visit

## 2015-11-15 DIAGNOSIS — Z1231 Encounter for screening mammogram for malignant neoplasm of breast: Secondary | ICD-10-CM

## 2016-03-18 DIAGNOSIS — E119 Type 2 diabetes mellitus without complications: Secondary | ICD-10-CM | POA: Diagnosis not present

## 2016-03-18 DIAGNOSIS — I10 Essential (primary) hypertension: Secondary | ICD-10-CM | POA: Diagnosis not present

## 2016-03-18 DIAGNOSIS — M25561 Pain in right knee: Secondary | ICD-10-CM | POA: Diagnosis not present

## 2016-03-18 DIAGNOSIS — J208 Acute bronchitis due to other specified organisms: Secondary | ICD-10-CM | POA: Diagnosis not present

## 2016-03-18 DIAGNOSIS — R002 Palpitations: Secondary | ICD-10-CM | POA: Diagnosis not present

## 2016-03-18 DIAGNOSIS — M25562 Pain in left knee: Secondary | ICD-10-CM | POA: Diagnosis not present

## 2016-05-15 DIAGNOSIS — H401131 Primary open-angle glaucoma, bilateral, mild stage: Secondary | ICD-10-CM | POA: Diagnosis not present

## 2016-05-20 ENCOUNTER — Ambulatory Visit
Admission: RE | Admit: 2016-05-20 | Discharge: 2016-05-20 | Disposition: A | Discharge status: Home / Self Care | Payer: Medicare Other | Source: Ambulatory Visit | Attending: Cardiovascular Disease | Admitting: Cardiovascular Disease

## 2016-05-20 ENCOUNTER — Other Ambulatory Visit: Payer: Self-pay | Admitting: Cardiovascular Disease

## 2016-05-20 DIAGNOSIS — M25571 Pain in right ankle and joints of right foot: Secondary | ICD-10-CM

## 2016-05-20 DIAGNOSIS — M19071 Primary osteoarthritis, right ankle and foot: Secondary | ICD-10-CM | POA: Diagnosis not present

## 2016-05-20 DIAGNOSIS — I10 Essential (primary) hypertension: Secondary | ICD-10-CM | POA: Diagnosis not present

## 2016-05-20 DIAGNOSIS — M25562 Pain in left knee: Secondary | ICD-10-CM | POA: Diagnosis not present

## 2016-05-20 DIAGNOSIS — E119 Type 2 diabetes mellitus without complications: Secondary | ICD-10-CM | POA: Diagnosis not present

## 2016-05-20 DIAGNOSIS — M79671 Pain in right foot: Secondary | ICD-10-CM

## 2016-05-20 DIAGNOSIS — R002 Palpitations: Secondary | ICD-10-CM | POA: Diagnosis not present

## 2016-05-20 DIAGNOSIS — M25561 Pain in right knee: Secondary | ICD-10-CM | POA: Diagnosis not present

## 2016-06-05 ENCOUNTER — Ambulatory Visit (INDEPENDENT_AMBULATORY_CARE_PROVIDER_SITE_OTHER): Payer: Medicare Other | Admitting: Podiatry

## 2016-06-05 ENCOUNTER — Encounter: Payer: Self-pay | Admitting: Podiatry

## 2016-06-05 VITALS — BP 160/94 | HR 77 | Resp 16

## 2016-06-05 DIAGNOSIS — E119 Type 2 diabetes mellitus without complications: Secondary | ICD-10-CM | POA: Diagnosis not present

## 2016-06-05 DIAGNOSIS — M722 Plantar fascial fibromatosis: Secondary | ICD-10-CM

## 2016-06-05 DIAGNOSIS — L600 Ingrowing nail: Secondary | ICD-10-CM

## 2016-06-05 DIAGNOSIS — M79606 Pain in leg, unspecified: Secondary | ICD-10-CM | POA: Diagnosis not present

## 2016-06-05 MED ORDER — TRIAMCINOLONE ACETONIDE 10 MG/ML IJ SUSP
10.0000 mg | Freq: Once | INTRAMUSCULAR | Status: AC
  Administered 2016-06-05: 10 mg

## 2016-06-05 NOTE — Patient Instructions (Signed)

## 2016-06-05 NOTE — Progress Notes (Signed)
   Subjective:    Patient ID: Heidi Wolf, female    DOB: 11/02/1941, 75 y.o.   MRN: [id number]  HPI    Review of Systems  All other systems reviewed and are negative.      Objective:   Physical Exam        Assessment & Plan:

## 2016-06-05 NOTE — Progress Notes (Signed)
Subjective:     Patient ID: Heidi Wolf, female   DOB: 12/08/1941, 75 y.o.   MRN: [id number]  HPI patient presents stating I'm having a lot of pain in the outside of my right ankle and also some pain on the inside of my foot. Patient states the heel hurts the most on the right and on the left she is getting a thickened nail which is increasingly hard for her to wear shoe gear with on the second toe   Review of Systems     Objective:   Physical Exam Neurovascular status intact muscle strength adequate range of motion within normal limits with patient found to have discomfort in the plantar right heel with inflammation noted and discomfort in the second nail left that's thickened and irritated. Patient has good digital perfusion and is well oriented 3    Assessment:     Plantar fasciitis right with inflammation fluid buildup and damaged second nail left it's painful when pressed    Plan:     H&P conditions reviewed an x-ray of right foot reviewed. I injected the right plantar fashion 3 Milligan Kenalog 5 mill grams Xylocaine and applied fascial brace to support and for the left I recommended removal of this nail and I explained procedure and risk and patient wants this fixed. I infiltrated 60 mg like Marcaine mixture remove the second nail exposed matrix and applied phenol 3 applications 30 seconds followed by alcohol lavage and sterile dressing. Debris did calluses on the right foot and reappoint again in 2 weeks to reevaluate  X-ray report right indicate spur formation with no indications of stress fracture arthritis

## 2016-06-10 ENCOUNTER — Encounter: Payer: Self-pay | Admitting: Podiatry

## 2016-06-10 ENCOUNTER — Ambulatory Visit (INDEPENDENT_AMBULATORY_CARE_PROVIDER_SITE_OTHER): Payer: Medicare Other | Admitting: Podiatry

## 2016-06-10 VITALS — BP 177/88 | HR 63 | Resp 14

## 2016-06-10 DIAGNOSIS — S90822A Blister (nonthermal), left foot, initial encounter: Secondary | ICD-10-CM

## 2016-06-10 DIAGNOSIS — L089 Local infection of the skin and subcutaneous tissue, unspecified: Secondary | ICD-10-CM

## 2016-06-10 DIAGNOSIS — Z09 Encounter for follow-up examination after completed treatment for conditions other than malignant neoplasm: Secondary | ICD-10-CM | POA: Diagnosis not present

## 2016-06-10 MED ORDER — CEPHALEXIN 500 MG PO CAPS: 500.0000 mg | ORAL_CAPSULE | Freq: Two times a day (BID) | ORAL | Status: DC

## 2016-06-10 NOTE — Progress Notes (Signed)
Subjective:     Patient ID: Heidi Wolf, female   DOB: 07-01-1941, 75 y.o.   MRN: [id number]  HPI this patient presents the office with chief complaint of a painful blister on the bottom of her second toe, left foot. She states she had nail surgery last week by Dr. Paulla Dolly for the permanent removal of the second toenail left foot. Since that visit. She has developed a fluid filled blister on the bottom second toe left foot. She says she has follow the directions and the blister still appeared she presents the office today for reevaluation of the second toe, left foot   Review of Systems     Objective:   Physical Exam GENERAL APPEARANCE: Alert, conversant. Appropriately groomed. No acute distress.  VASCULAR: Pedal pulses are  palpable at  [facility] and PT bilateral.  Capillary refill time is immediate to all digits,  Normal temperature gradient.   NEUROLOGIC: sensation is normal to 5.07 monofilament at 5/5 sites bilateral.  Light touch is intact bilateral, Muscle strength normal.  MUSCULOSKELETAL: acceptable muscle strength, tone and stability bilateral.  Intrinsic muscluature intact bilateral.  Rectus appearance of foot and digits noted bilateral. Palpable pain persists right heel.  DERMATOLOGIC: skin color, texture, and turgor are within normal limits.  No preulcerative lesions or ulcers  are seen, no interdigital maceration noted.  No open lesions present.  . No drainage noted. Red granular tissue on nail bed second toe left foot. Possible granulation tissue laterally with necrotic tissue noted medially.  She has fluctuant blister plantar aspect second toe left.        Assessment:     S/p nail surgery   Allergic reaction to glue from her bandage.     Plan:     ROV  Debride necrotic tissue.  Prescribe cephalexin.  Incision and drainage blister se cond toe left .  DSD Myrle Sheng.  Continue soaks.  RYC 2 weeks at regularly scheduled appointment.   Gardiner Barefoot DPM

## 2016-06-13 ENCOUNTER — Telehealth: Payer: Self-pay | Admitting: *Deleted

## 2016-06-13 NOTE — Telephone Encounter (Signed)
Called patient at [phone] (Home #) to check to see how they were doing from when their nail was removed on Thursday, June 05, 2016. Pt stated, "She saw Dr. Prudence Davidson on June 10, 2016 and he took fluid out of toe and put her on an antibiotic. She feels like her nail is getting better". Patient has a follow-up appointment with Dr. Paulla Dolly on June 19, 2016 at 10:15 am. I did remind patient of their appointment. They stated they understood.

## 2016-06-19 ENCOUNTER — Ambulatory Visit (INDEPENDENT_AMBULATORY_CARE_PROVIDER_SITE_OTHER): Payer: Medicare Other | Admitting: Podiatry

## 2016-06-19 ENCOUNTER — Encounter: Payer: Self-pay | Admitting: Podiatry

## 2016-06-19 DIAGNOSIS — M722 Plantar fascial fibromatosis: Secondary | ICD-10-CM

## 2016-06-19 MED ORDER — TRIAMCINOLONE ACETONIDE 10 MG/ML IJ SUSP
10.0000 mg | Freq: Once | INTRAMUSCULAR | Status: AC
  Administered 2016-06-19: 10 mg

## 2016-06-20 NOTE — Progress Notes (Signed)
Subjective:     Patient ID: Heidi Wolf, female   DOB: 1941/02/19, 75 y.o.   MRN: [id number]  HPI patient presents stating I'm still having pain in my right heel at the insertional point of the tendon with fluid buildup   Review of Systems     Objective:   Physical Exam Neurovascular status intact muscle strength adequate with continued inflammation and discomfort right plantar heel at the insertion of the tendon into the calcaneus    Assessment:     Plantar fasciitis improved but present right    Plan:     Reinjected the plantar fascia right 3 mg Kenalog 5 mg Xylocaine and gave instructions on physical therapy supportive shoe gear usage and reappoint if symptoms persist

## 2016-06-28 HISTORY — PX: CORONARY ANGIOPLASTY WITH STENT PLACEMENT: SHX49

## 2016-07-01 DIAGNOSIS — R079 Chest pain, unspecified: Secondary | ICD-10-CM

## 2016-07-01 HISTORY — DX: Chest pain, unspecified: R07.9

## 2016-07-02 ENCOUNTER — Encounter (HOSPITAL_COMMUNITY): Payer: Self-pay | Admitting: General Practice

## 2016-07-02 ENCOUNTER — Encounter (HOSPITAL_COMMUNITY): Payer: Self-pay

## 2016-07-02 ENCOUNTER — Inpatient Hospital Stay (HOSPITAL_COMMUNITY)
Admission: AD | Admit: 2016-07-02 | Discharge: 2016-07-04 | DRG: 247 | Disposition: A | Discharge status: Home / Self Care | Payer: Medicare Other | Source: Ambulatory Visit | Attending: Cardiovascular Disease | Admitting: Cardiovascular Disease

## 2016-07-02 DIAGNOSIS — Z833 Family history of diabetes mellitus: Secondary | ICD-10-CM | POA: Diagnosis not present

## 2016-07-02 DIAGNOSIS — F419 Anxiety disorder, unspecified: Secondary | ICD-10-CM | POA: Diagnosis present

## 2016-07-02 DIAGNOSIS — E7800 Pure hypercholesterolemia, unspecified: Secondary | ICD-10-CM | POA: Diagnosis not present

## 2016-07-02 DIAGNOSIS — Z9861 Coronary angioplasty status: Secondary | ICD-10-CM

## 2016-07-02 DIAGNOSIS — I25110 Atherosclerotic heart disease of native coronary artery with unstable angina pectoris: Principal | ICD-10-CM | POA: Diagnosis present

## 2016-07-02 DIAGNOSIS — Z8249 Family history of ischemic heart disease and other diseases of the circulatory system: Secondary | ICD-10-CM | POA: Diagnosis not present

## 2016-07-02 DIAGNOSIS — Z7984 Long term (current) use of oral hypoglycemic drugs: Secondary | ICD-10-CM

## 2016-07-02 DIAGNOSIS — H409 Unspecified glaucoma: Secondary | ICD-10-CM | POA: Diagnosis not present

## 2016-07-02 DIAGNOSIS — M25562 Pain in left knee: Secondary | ICD-10-CM | POA: Diagnosis not present

## 2016-07-02 DIAGNOSIS — Z823 Family history of stroke: Secondary | ICD-10-CM

## 2016-07-02 DIAGNOSIS — M25561 Pain in right knee: Secondary | ICD-10-CM | POA: Diagnosis not present

## 2016-07-02 DIAGNOSIS — I10 Essential (primary) hypertension: Secondary | ICD-10-CM | POA: Diagnosis present

## 2016-07-02 DIAGNOSIS — Z7982 Long term (current) use of aspirin: Secondary | ICD-10-CM

## 2016-07-02 DIAGNOSIS — R079 Chest pain, unspecified: Secondary | ICD-10-CM | POA: Diagnosis present

## 2016-07-02 DIAGNOSIS — M5090 Cervical disc disorder, unspecified, unspecified cervical region: Secondary | ICD-10-CM | POA: Diagnosis not present

## 2016-07-02 DIAGNOSIS — K219 Gastro-esophageal reflux disease without esophagitis: Secondary | ICD-10-CM | POA: Diagnosis not present

## 2016-07-02 DIAGNOSIS — Z955 Presence of coronary angioplasty implant and graft: Secondary | ICD-10-CM

## 2016-07-02 DIAGNOSIS — E784 Other hyperlipidemia: Secondary | ICD-10-CM | POA: Diagnosis not present

## 2016-07-02 DIAGNOSIS — R072 Precordial pain: Secondary | ICD-10-CM | POA: Diagnosis not present

## 2016-07-02 DIAGNOSIS — I2510 Atherosclerotic heart disease of native coronary artery without angina pectoris: Secondary | ICD-10-CM | POA: Diagnosis present

## 2016-07-02 DIAGNOSIS — E119 Type 2 diabetes mellitus without complications: Secondary | ICD-10-CM | POA: Diagnosis present

## 2016-07-02 DIAGNOSIS — M25571 Pain in right ankle and joints of right foot: Secondary | ICD-10-CM | POA: Diagnosis not present

## 2016-07-02 LAB — CBC WITH DIFFERENTIAL/PLATELET
Basophils Absolute: 0 10*3/uL (ref 0.0–0.1)
Basophils Relative: 0 %
Eosinophils Absolute: 0 10*3/uL (ref 0.0–0.7)
Eosinophils Relative: 0 %
HCT: 41.5 % (ref 36.0–46.0)
Hemoglobin: 13.6 g/dL (ref 12.0–15.0)
Lymphocytes Relative: 24 %
Lymphs Abs: 2.2 10*3/uL (ref 0.7–4.0)
MCH: 26.7 pg (ref 26.0–34.0)
MCHC: 32.8 g/dL (ref 30.0–36.0)
MCV: 81.4 fL (ref 78.0–100.0)
Monocytes Absolute: 0.5 10*3/uL (ref 0.1–1.0)
Monocytes Relative: 6 %
Neutro Abs: 6.5 10*3/uL (ref 1.7–7.7)
Neutrophils Relative %: 70 %
Platelets: 259 10*3/uL (ref 150–400)
RBC: 5.1 MIL/uL (ref 3.87–5.11)
RDW: 14.1 % (ref 11.5–15.5)
WBC: 9.3 10*3/uL (ref 4.0–10.5)

## 2016-07-02 LAB — PROTIME-INR
INR: 1.06 (ref 0.00–1.49)
Prothrombin Time: 14 seconds (ref 11.6–15.2)

## 2016-07-02 LAB — GLUCOSE, CAPILLARY
Glucose-Capillary: 136 mg/dL — ABNORMAL HIGH (ref 65–99)
Glucose-Capillary: 122 mg/dL — ABNORMAL HIGH (ref 65–99)

## 2016-07-02 LAB — COMPREHENSIVE METABOLIC PANEL
ALT: 13 U/L — ABNORMAL LOW (ref 14–54)
AST: 20 U/L (ref 15–41)
Albumin: 3.8 g/dL (ref 3.5–5.0)
Alkaline Phosphatase: 54 U/L (ref 38–126)
Anion gap: 6 (ref 5–15)
BUN: 16 mg/dL (ref 6–20)
CO2: 26 mmol/L (ref 22–32)
Calcium: 9.4 mg/dL (ref 8.9–10.3)
Chloride: 107 mmol/L (ref 101–111)
Creatinine, Ser: 1.07 mg/dL — ABNORMAL HIGH (ref 0.44–1.00)
GFR calc Af Amer: 57 mL/min — ABNORMAL LOW (ref >60)
GFR calc non Af Amer: 49 mL/min — ABNORMAL LOW (ref >60)
Glucose, Bld: 101 mg/dL — ABNORMAL HIGH (ref 65–99)
Potassium: 3.5 mmol/L (ref 3.5–5.1)
Sodium: 139 mmol/L (ref 135–145)
Total Bilirubin: 0.6 mg/dL (ref 0.3–1.2)
Total Protein: 7.5 g/dL (ref 6.5–8.1)

## 2016-07-02 LAB — HEPARIN LEVEL (UNFRACTIONATED): Heparin Unfractionated: 0.32 IU/mL (ref 0.30–0.70)

## 2016-07-02 LAB — TROPONIN I
Troponin I: 0.03 ng/mL (ref <0.03)
Troponin I: <0.03 ng/mL (ref <0.03)

## 2016-07-02 MED ORDER — HEPARIN (PORCINE) IN NACL 100-0.45 UNIT/ML-% IJ SOLN
950.0000 [IU]/h | INTRAMUSCULAR | Status: DC
  Administered 2016-07-02: 850 [IU]/h via INTRAVENOUS
  Filled 2016-07-02: qty 250

## 2016-07-02 MED ORDER — INSULIN ASPART 100 UNIT/ML ~~LOC~~ SOLN: 0.0000 [IU] | Freq: Every day | SUBCUTANEOUS | Status: DC

## 2016-07-02 MED ORDER — SODIUM CHLORIDE 0.9 % IV SOLN: 250.0000 mL | INTRAVENOUS | Status: DC | PRN

## 2016-07-02 MED ORDER — ATORVASTATIN CALCIUM 40 MG PO TABS
40.0000 mg | ORAL_TABLET | Freq: Every day | ORAL | Status: DC
  Administered 2016-07-02 – 2016-07-04 (×3): 40 mg via ORAL
  Filled 2016-07-02 (×3): qty 1

## 2016-07-02 MED ORDER — SODIUM CHLORIDE 0.9 % IV SOLN
INTRAVENOUS | Status: DC
  Administered 2016-07-02: 20:00:00 via INTRAVENOUS

## 2016-07-02 MED ORDER — LATANOPROST 0.005 % OP SOLN
1.0000 [drp] | Freq: Every day | OPHTHALMIC | Status: DC
  Administered 2016-07-02 – 2016-07-03 (×2): 1 [drp] via OPHTHALMIC
  Filled 2016-07-02 (×2): qty 2.5

## 2016-07-02 MED ORDER — PANTOPRAZOLE SODIUM 40 MG PO TBEC
40.0000 mg | DELAYED_RELEASE_TABLET | Freq: Every day | ORAL | Status: DC
  Administered 2016-07-02 – 2016-07-04 (×3): 40 mg via ORAL
  Filled 2016-07-02 (×3): qty 1

## 2016-07-02 MED ORDER — SODIUM CHLORIDE 0.9% FLUSH: 3.0000 mL | INTRAVENOUS | Status: DC | PRN

## 2016-07-02 MED ORDER — NITROGLYCERIN 0.4 MG SL SUBL
0.4000 mg | SUBLINGUAL_TABLET | SUBLINGUAL | Status: DC | PRN
  Administered 2016-07-04 (×3): 0.4 mg via SUBLINGUAL
  Filled 2016-07-02: qty 1

## 2016-07-02 MED ORDER — ACETAMINOPHEN 325 MG PO TABS
650.0000 mg | ORAL_TABLET | ORAL | Status: DC | PRN
  Administered 2016-07-03: 13:00:00 650 mg via ORAL
  Filled 2016-07-02: qty 2

## 2016-07-02 MED ORDER — SODIUM CHLORIDE 0.9% FLUSH: 3.0000 mL | Freq: Two times a day (BID) | INTRAVENOUS | Status: DC

## 2016-07-02 MED ORDER — AMLODIPINE BESYLATE 5 MG PO TABS
5.0000 mg | ORAL_TABLET | Freq: Every day | ORAL | Status: DC
  Administered 2016-07-02 – 2016-07-04 (×3): 5 mg via ORAL
  Filled 2016-07-02 (×3): qty 1

## 2016-07-02 MED ORDER — ONDANSETRON HCL 4 MG/2ML IJ SOLN: 4.0000 mg | Freq: Four times a day (QID) | INTRAMUSCULAR | Status: DC | PRN

## 2016-07-02 MED ORDER — VITAMIN D 1000 UNITS PO TABS
1000.0000 [IU] | ORAL_TABLET | Freq: Every day | ORAL | Status: DC
  Administered 2016-07-02 – 2016-07-04 (×3): 1000 [IU] via ORAL
  Filled 2016-07-02 (×3): qty 1

## 2016-07-02 MED ORDER — GLIPIZIDE ER 2.5 MG PO TB24
2.5000 mg | ORAL_TABLET | Freq: Every day | ORAL | Status: DC
  Administered 2016-07-02 – 2016-07-04 (×2): 2.5 mg via ORAL
  Filled 2016-07-02 (×4): qty 1

## 2016-07-02 MED ORDER — BENAZEPRIL HCL 20 MG PO TABS
20.0000 mg | ORAL_TABLET | Freq: Every day | ORAL | Status: DC
  Administered 2016-07-03 – 2016-07-04 (×2): 20 mg via ORAL
  Filled 2016-07-02 (×3): qty 1

## 2016-07-02 MED ORDER — HEPARIN BOLUS VIA INFUSION
3000.0000 [IU] | Freq: Once | INTRAVENOUS | Status: AC
  Administered 2016-07-02: 3000 [IU] via INTRAVENOUS
  Filled 2016-07-02: qty 3000

## 2016-07-02 MED ORDER — METOPROLOL TARTRATE 25 MG PO TABS
25.0000 mg | ORAL_TABLET | Freq: Every day | ORAL | Status: DC
  Administered 2016-07-03 – 2016-07-04 (×2): 25 mg via ORAL
  Filled 2016-07-02 (×2): qty 1

## 2016-07-02 MED ORDER — OXYCODONE HCL 5 MG PO TABS
5.0000 mg | ORAL_TABLET | Freq: Four times a day (QID) | ORAL | Status: DC | PRN
  Administered 2016-07-02 – 2016-07-04 (×2): 5 mg via ORAL
  Filled 2016-07-02 (×2): qty 1

## 2016-07-02 MED ORDER — LORATADINE 10 MG PO TABS: 10.0000 mg | ORAL_TABLET | Freq: Every day | ORAL | Status: DC | PRN

## 2016-07-02 MED ORDER — INSULIN ASPART 100 UNIT/ML ~~LOC~~ SOLN
0.0000 [IU] | Freq: Three times a day (TID) | SUBCUTANEOUS | Status: DC
  Administered 2016-07-02: 2 [IU] via SUBCUTANEOUS

## 2016-07-02 NOTE — Progress Notes (Signed)
ANTICOAGULATION CONSULT NOTE   Pharmacy Consult:  Heparin Indication: chest pain/ACS  Allergies  Allergen Reactions  . Aspirin Other (See Comments)    Makes stomach burn    Patient Measurements: Height: 5\' 4"  (162.6 cm) Weight: 159 lb (72.122 kg) IBW/kg (Calculated) : 54.7 Heparin Dosing Weight:  70 kg  Vital Signs: Temp: 97.9 F (36.6 C) (07/05 1942) Temp Source: Oral (07/05 1942) BP: 147/62 mmHg (07/05 1942) Pulse Rate: 61 (07/05 1942)  Labs:  Recent Labs  07/02/16 1331 07/02/16 1908  HGB 13.6  --   HCT 41.5  --   PLT 259  --   LABPROT  --  14.0  INR  --  1.06  HEPARINUNFRC  --  0.32  CREATININE 1.07*  --   TROPONINI 0.03*  --     Estimated Creatinine Clearance: 44.2 mL/min (by C-G formula based on Cr of 1.07).   Medical History: Past Medical History  Diagnosis Date  . Hypertension   . Glaucoma   . Hyperlipidemia   . Cataracts, both eyes   . Colon polyps     Hyperplastic 2003  . Internal hemorrhoid   . External hemorrhoid   . Personal history of colonic polyps 03/03/2012  . Anemia   . Heart murmur   . GERD (gastroesophageal reflux disease)   . Arthritis     "right hip" (09/21/2014)  . Anxiety   . Chest pain 07/01/2016  . Type II diabetes mellitus ([place])     type 2      Assessment: 23 YOF presented with chest pain and Pharmacy consulted to initiate IV heparin.  Baseline CBC WNL.  Patient reported she is not on a blood thinner at home.  Initial heparin level = 0.32, therapeutic Planning cath 7/6  Goal of Therapy:  Heparin level 0.3-0.7 units/ml Monitor platelets by anticoagulation protocol: Yes    Plan:  Heparin to 950 units / hr to prevent drop to less than 0.3 Daily heparin level, CBC  Thank you Anette Guarneri, PharmD [phone] 07/02/2016, 7:59 PM

## 2016-07-02 NOTE — H&P (Signed)
Referring Physician:  GWENDYLAN PROCHNOW is an 75 y.o. female.                       Chief Complaint: Chest pain  HPI:  75 year old female with diabetes, type II, hypertension, cervical disc disease and hypercholesterolemia has 2 day history of recurrent chest pain, tightness, across chest starting from right side to left side of chest, responding to NTG intake.   Past Medical History  Diagnosis Date  . Hypertension   . Glaucoma   . Hyperlipidemia   . Cataracts, both eyes   . Colon polyps     Hyperplastic 2003  . Internal hemorrhoid   . External hemorrhoid   . Personal history of colonic polyps 03/03/2012  . Anemia   . Heart murmur   . Type II diabetes mellitus ([place])   . GERD (gastroesophageal reflux disease)   . Arthritis     "right hip" (09/21/2014)  . Anxiety       Past Surgical History  Procedure Laterality Date  . Tubal ligation  1971  . Cholecystectomy    . Colonoscopy    . Abdominal hysterectomy    . Appendectomy      "w/gallbladder"  . Cardiac catheterization  X 2    Family History  Problem Relation Age of Onset  . Colon cancer Neg Hx   . Stomach cancer Neg Hx   . Heart disease Mother   . Diabetes Mother   . Arthritis Mother   . Heart disease Brother   . Heart disease Sister     x 2  . Stroke Brother   . Stroke Daughter   . Diabetes Maternal Grandmother   . Stroke Maternal Grandmother   . Stroke Father   . Breast cancer      niece   Social History:  reports that she has never smoked. She has never used smokeless tobacco. She reports that she does not drink alcohol or use illicit drugs.  Allergies:  Allergies  Allergen Reactions  . Aspirin Other (See Comments)    Makes stomach burn    Medications Prior to Admission  Medication Sig Dispense Refill  . amLODipine (NORVASC) 5 MG tablet Take 1 tablet (5 mg total) by mouth daily. 30 tablet 1  . aspirin EC 81 MG tablet Take 81 mg by mouth every other day.     Marland Kitchen atorvastatin (LIPITOR) 40 MG tablet Take 40  mg by mouth daily.    . benazepril (LOTENSIN) 20 MG tablet Take 20 mg by mouth daily.    . cholecalciferol (VITAMIN D) 1000 UNITS tablet Take 1,000 Units by mouth daily.    Marland Kitchen glipiZIDE (GLUCOTROL XL) 2.5 MG 24 hr tablet Take 2.5 mg by mouth daily.    Marland Kitchen loratadine (CLARITIN) 10 MG tablet Take 10 mg by mouth daily as needed for allergies.     Marland Kitchen LUMIGAN 0.01 % SOLN Place 1 drop into both eyes at bedtime.     . metoprolol tartrate (LOPRESSOR) 25 MG tablet Take 25 mg by mouth daily.     . nitroGLYCERIN (NITROSTAT) 0.4 MG SL tablet Place 0.4 mg under the tongue every 5 (five) minutes as needed for chest pain.     Marland Kitchen omeprazole (PRILOSEC) 20 MG capsule Take 20 mg by mouth daily as needed (for acid reflux).     . [DISCONTINUED] cephALEXin (KEFLEX) 500 MG capsule Take 1 capsule (500 mg total) by mouth 2 (two) times daily. 15 capsule  0    No results found for this or any previous visit (from the past 48 hour(s)). No results found.  Review Of Systems Constitutional: Positive for appetite change. Negative for fever and chills.  HENT: Negative for congestion.  Eyes: Negative for visual disturbance.  Respiratory: Negative for shortness of breath.  Cardiovascular: Positive for chest pain. Negative for leg swelling.  Gastrointestinal: Positive for nausea. Negative for vomiting and abdominal pain.  Genitourinary: Negative for dysuria and flank pain.  Musculoskeletal: Positive for back pain, neck pain and neck stiffness.  Skin: Negative for rash.  Neurological: Negative for light-headedness and headaches  Blood pressure 170/101, pulse 64, temperature 99.1 F (37.3 C), temperature source Oral, resp. rate 20, height 5\' 4"  (1.626 m), weight 72.122 kg (159 lb), SpO2 99 %. GENERAL: The patient is short and averagely nourished black female in mild distress.  HEENT: The patient is normocephalic, atraumatic. She wears glasses. Has brown eyes. Conjunctivae pink. Sclera-non-icteric.  NECK: No JVD.  Positive faint bruit.  LUNGS: Clear bilaterally.  HEART: Normal S1 and S2. III/VI systolic murmur.  ABDOMEN: Soft and nontender.  EXTREMITIES: No edema, cyanosis, or clubbing.  SKIN: Warm and dry.  NEUROLOGICAL: The patient moves all 4 extremities. Cranial nerves grossly intact. Speech is normal.   Assessment/Plan Acute coronary syndrome DM, II  Hypertension  Hyperlipidemia  Cervical disc disease Anxiety  Place in observation. Cardiac cath in AM.   Birdie Riddle, MD  07/02/2016, 1:29 PM

## 2016-07-02 NOTE — Progress Notes (Signed)
ANTICOAGULATION CONSULT NOTE - Initial Consult  Pharmacy Consult:  Heparin Indication: chest pain/ACS  Allergies  Allergen Reactions  . Aspirin Other (See Comments)    Makes stomach burn    Patient Measurements: Height: 5\' 4"  (162.6 cm) Weight: 159 lb (72.122 kg) IBW/kg (Calculated) : 54.7 Heparin Dosing Weight:  70 kg  Vital Signs: Temp: 99.1 F (37.3 C) (07/05 1302) Temp Source: Oral (07/05 1302) BP: 170/101 mmHg (07/05 1302) Pulse Rate: 64 (07/05 1302)  Labs:  Recent Labs  07/02/16 1331  HGB 13.6  HCT 41.5  PLT 259    CrCl cannot be calculated (Patient has no serum creatinine result on file.).   Medical History: Past Medical History  Diagnosis Date  . Hypertension   . Glaucoma   . Hyperlipidemia   . Cataracts, both eyes   . Colon polyps     Hyperplastic 2003  . Internal hemorrhoid   . External hemorrhoid   . Personal history of colonic polyps 03/03/2012  . Anemia   . Heart murmur   . GERD (gastroesophageal reflux disease)   . Arthritis     "right hip" (09/21/2014)  . Anxiety   . Chest pain 07/01/2016  . Type II diabetes mellitus ([place])     type 2      Assessment: Heidi Wolf presented with chest pain and Pharmacy consulted to initiate IV heparin.  Baseline CBC WNL.  Patient reported she is not on a blood thinner at home.   Goal of Therapy:  Heparin level 0.3-0.7 units/ml Monitor platelets by anticoagulation protocol: Yes    Plan:  - Heparin 3000 units IV bolus x 1, then - Heparin gtt at 850 units/hr - Check 8 hr HL - Daily HL / CBC   Akash Winski D. Mina Marble, PharmD, BCPS Pager:  [phone] 07/02/2016, 1:54 PM

## 2016-07-02 NOTE — Plan of Care (Signed)
Problem: Consults Goal: Cardiac Cath Patient Education (See Patient Education module for education specifics.) Outcome: Progressing Pt educated on cardiac cath with possible percutaneous coronary intervention. Consent signed, groin prepped. Heparin going at 8.5 with 3000 unit bolus. CP down to a 3/10 post oxycodone 5mg  Q6 PRN. 1st trop neg. Pt has no further questions at this time. States they have had 2 caths in the past and do not wish to watch cardiac catheterization video. Will continue to monitor. Family at bedside.

## 2016-07-03 ENCOUNTER — Encounter (HOSPITAL_COMMUNITY)
Admission: AD | Disposition: A | Discharge status: Home / Self Care | Payer: Self-pay | Source: Ambulatory Visit | Attending: Cardiovascular Disease

## 2016-07-03 ENCOUNTER — Encounter (HOSPITAL_COMMUNITY): Payer: Self-pay | Admitting: Cardiovascular Disease

## 2016-07-03 DIAGNOSIS — I25110 Atherosclerotic heart disease of native coronary artery with unstable angina pectoris: Principal | ICD-10-CM

## 2016-07-03 DIAGNOSIS — M5090 Cervical disc disorder, unspecified, unspecified cervical region: Secondary | ICD-10-CM | POA: Diagnosis not present

## 2016-07-03 DIAGNOSIS — R0789 Other chest pain: Secondary | ICD-10-CM | POA: Diagnosis not present

## 2016-07-03 DIAGNOSIS — E7800 Pure hypercholesterolemia, unspecified: Secondary | ICD-10-CM | POA: Diagnosis not present

## 2016-07-03 DIAGNOSIS — Z833 Family history of diabetes mellitus: Secondary | ICD-10-CM | POA: Diagnosis not present

## 2016-07-03 DIAGNOSIS — Z7982 Long term (current) use of aspirin: Secondary | ICD-10-CM | POA: Diagnosis not present

## 2016-07-03 DIAGNOSIS — Z8249 Family history of ischemic heart disease and other diseases of the circulatory system: Secondary | ICD-10-CM | POA: Diagnosis not present

## 2016-07-03 DIAGNOSIS — F419 Anxiety disorder, unspecified: Secondary | ICD-10-CM | POA: Diagnosis not present

## 2016-07-03 DIAGNOSIS — E119 Type 2 diabetes mellitus without complications: Secondary | ICD-10-CM | POA: Diagnosis not present

## 2016-07-03 DIAGNOSIS — R079 Chest pain, unspecified: Secondary | ICD-10-CM | POA: Diagnosis present

## 2016-07-03 DIAGNOSIS — H409 Unspecified glaucoma: Secondary | ICD-10-CM | POA: Diagnosis not present

## 2016-07-03 DIAGNOSIS — K219 Gastro-esophageal reflux disease without esophagitis: Secondary | ICD-10-CM | POA: Diagnosis not present

## 2016-07-03 DIAGNOSIS — E784 Other hyperlipidemia: Secondary | ICD-10-CM | POA: Diagnosis not present

## 2016-07-03 DIAGNOSIS — Z823 Family history of stroke: Secondary | ICD-10-CM | POA: Diagnosis not present

## 2016-07-03 DIAGNOSIS — Z7984 Long term (current) use of oral hypoglycemic drugs: Secondary | ICD-10-CM | POA: Diagnosis not present

## 2016-07-03 DIAGNOSIS — I10 Essential (primary) hypertension: Secondary | ICD-10-CM | POA: Diagnosis not present

## 2016-07-03 HISTORY — PX: CARDIAC CATHETERIZATION: SHX172

## 2016-07-03 LAB — CBC
HCT: 42.5 % (ref 36.0–46.0)
Hemoglobin: 13.8 g/dL (ref 12.0–15.0)
MCH: 26.6 pg (ref 26.0–34.0)
MCHC: 32.5 g/dL (ref 30.0–36.0)
MCV: 82 fL (ref 78.0–100.0)
Platelets: 258 10*3/uL (ref 150–400)
RBC: 5.18 MIL/uL — ABNORMAL HIGH (ref 3.87–5.11)
RDW: 14.1 % (ref 11.5–15.5)
WBC: 9.3 10*3/uL (ref 4.0–10.5)

## 2016-07-03 LAB — HEPARIN LEVEL (UNFRACTIONATED): Heparin Unfractionated: 0.52 IU/mL (ref 0.30–0.70)

## 2016-07-03 LAB — LIPID PANEL
Cholesterol: 209 mg/dL — ABNORMAL HIGH (ref 0–200)
HDL: 38 mg/dL — ABNORMAL LOW (ref >40)
LDL Cholesterol: 139 mg/dL — ABNORMAL HIGH (ref 0–99)
Total CHOL/HDL Ratio: 5.5 RATIO
Triglycerides: 158 mg/dL — ABNORMAL HIGH (ref <150)
VLDL: 32 mg/dL (ref 0–40)

## 2016-07-03 LAB — BASIC METABOLIC PANEL
Anion gap: 6 (ref 5–15)
BUN: 13 mg/dL (ref 6–20)
CO2: 27 mmol/L (ref 22–32)
Calcium: 9.4 mg/dL (ref 8.9–10.3)
Chloride: 109 mmol/L (ref 101–111)
Creatinine, Ser: 0.99 mg/dL (ref 0.44–1.00)
GFR calc Af Amer: >60 mL/min (ref >60)
GFR calc non Af Amer: 54 mL/min — ABNORMAL LOW (ref >60)
Glucose, Bld: 87 mg/dL (ref 65–99)
Potassium: 3.7 mmol/L (ref 3.5–5.1)
Sodium: 142 mmol/L (ref 135–145)

## 2016-07-03 LAB — POCT ACTIVATED CLOTTING TIME: Activated Clotting Time: 340 seconds

## 2016-07-03 LAB — PROTIME-INR
INR: 1.06 (ref 0.00–1.49)
Prothrombin Time: 14 seconds (ref 11.6–15.2)

## 2016-07-03 LAB — GLUCOSE, CAPILLARY
Glucose-Capillary: 107 mg/dL — ABNORMAL HIGH (ref 65–99)
Glucose-Capillary: 119 mg/dL — ABNORMAL HIGH (ref 65–99)

## 2016-07-03 LAB — TROPONIN I: Troponin I: <0.03 ng/mL (ref <0.03)

## 2016-07-03 SURGERY — LEFT HEART CATH AND CORONARY ANGIOGRAPHY
Anesthesia: LOCAL

## 2016-07-03 MED ORDER — FENTANYL CITRATE (PF) 100 MCG/2ML IJ SOLN
INTRAMUSCULAR | Status: DC | PRN
  Administered 2016-07-03 (×2): 25 ug via INTRAVENOUS

## 2016-07-03 MED ORDER — HYDRALAZINE HCL 20 MG/ML IJ SOLN
INTRAMUSCULAR | Status: AC
  Filled 2016-07-03: qty 1

## 2016-07-03 MED ORDER — HYDRALAZINE HCL 20 MG/ML IJ SOLN: 10.0000 mg | INTRAMUSCULAR | Status: DC | PRN

## 2016-07-03 MED ORDER — CLOPIDOGREL BISULFATE 300 MG PO TABS
ORAL_TABLET | ORAL | Status: DC | PRN
  Administered 2016-07-03: 600 mg via ORAL

## 2016-07-03 MED ORDER — ASPIRIN 325 MG PO TABS
ORAL_TABLET | ORAL | Status: AC
  Filled 2016-07-03: qty 1

## 2016-07-03 MED ORDER — MIDAZOLAM HCL 2 MG/2ML IJ SOLN
INTRAMUSCULAR | Status: AC
  Filled 2016-07-03: qty 2

## 2016-07-03 MED ORDER — FAMOTIDINE IN NACL 20-0.9 MG/50ML-% IV SOLN
20.0000 mg | Freq: Once | INTRAVENOUS | Status: AC
  Administered 2016-07-03: 20 mg via INTRAVENOUS
  Filled 2016-07-03: qty 50

## 2016-07-03 MED ORDER — FENTANYL CITRATE (PF) 100 MCG/2ML IJ SOLN
INTRAMUSCULAR | Status: AC
  Filled 2016-07-03: qty 2

## 2016-07-03 MED ORDER — ONDANSETRON HCL 4 MG/2ML IJ SOLN
INTRAMUSCULAR | Status: DC | PRN
Start: 2016-07-03 — End: 2016-07-03
  Administered 2016-07-03: 4 mg via INTRAVENOUS

## 2016-07-03 MED ORDER — LIDOCAINE HCL (PF) 1 % IJ SOLN
INTRAMUSCULAR | Status: AC
  Filled 2016-07-03: qty 30

## 2016-07-03 MED ORDER — MIDAZOLAM HCL 2 MG/2ML IJ SOLN
INTRAMUSCULAR | Status: DC | PRN
  Administered 2016-07-03 (×2): 1 mg via INTRAVENOUS

## 2016-07-03 MED ORDER — MIDAZOLAM HCL 2 MG/2ML IJ SOLN
INTRAMUSCULAR | Status: DC | PRN
  Administered 2016-07-03: 1 mg via INTRAVENOUS

## 2016-07-03 MED ORDER — CLOPIDOGREL BISULFATE 300 MG PO TABS
ORAL_TABLET | ORAL | Status: AC
  Filled 2016-07-03: qty 2

## 2016-07-03 MED ORDER — PROMETHAZINE HCL 25 MG/ML IJ SOLN
12.5000 mg | Freq: Once | INTRAMUSCULAR | Status: DC
  Filled 2016-07-03: qty 1

## 2016-07-03 MED ORDER — HEPARIN (PORCINE) IN NACL 2-0.9 UNIT/ML-% IJ SOLN
INTRAMUSCULAR | Status: DC | PRN
  Administered 2016-07-03: 1000 mL

## 2016-07-03 MED ORDER — SODIUM CHLORIDE 0.9 % IV SOLN: 250.0000 mL | INTRAVENOUS | Status: DC | PRN

## 2016-07-03 MED ORDER — SODIUM CHLORIDE 0.9% FLUSH
3.0000 mL | Freq: Two times a day (BID) | INTRAVENOUS | Status: DC
  Administered 2016-07-03 – 2016-07-04 (×2): 3 mL via INTRAVENOUS

## 2016-07-03 MED ORDER — ASPIRIN 81 MG PO CHEW
81.0000 mg | CHEWABLE_TABLET | Freq: Every day | ORAL | Status: DC
  Administered 2016-07-04: 11:00:00 81 mg via ORAL
  Filled 2016-07-03: qty 1

## 2016-07-03 MED ORDER — ASPIRIN 325 MG PO TABS
325.0000 mg | ORAL_TABLET | Freq: Once | ORAL | Status: AC
  Administered 2016-07-03: 325 mg via ORAL

## 2016-07-03 MED ORDER — BIVALIRUDIN 250 MG IV SOLR
INTRAVENOUS | Status: AC
  Filled 2016-07-03: qty 250

## 2016-07-03 MED ORDER — ACETAMINOPHEN 325 MG PO TABS: 650.0000 mg | ORAL_TABLET | ORAL | Status: DC | PRN

## 2016-07-03 MED ORDER — ONDANSETRON HCL 4 MG/2ML IJ SOLN: 4.0000 mg | Freq: Four times a day (QID) | INTRAMUSCULAR | Status: DC | PRN

## 2016-07-03 MED ORDER — SODIUM CHLORIDE 0.9% FLUSH: 3.0000 mL | INTRAVENOUS | Status: DC | PRN

## 2016-07-03 MED ORDER — CLOPIDOGREL BISULFATE 75 MG PO TABS
75.0000 mg | ORAL_TABLET | Freq: Every day | ORAL | Status: DC
  Administered 2016-07-04: 11:00:00 75 mg via ORAL
  Filled 2016-07-03: qty 1

## 2016-07-03 MED ORDER — HYDRALAZINE HCL 20 MG/ML IJ SOLN
INTRAMUSCULAR | Status: DC | PRN
  Administered 2016-07-03: 10 mg via INTRAVENOUS

## 2016-07-03 MED ORDER — IOPAMIDOL (ISOVUE-370) INJECTION 76%
INTRAVENOUS | Status: DC | PRN
  Administered 2016-07-03 (×2): 70 mL via INTRAVENOUS

## 2016-07-03 MED ORDER — ONDANSETRON HCL 4 MG/2ML IJ SOLN
INTRAMUSCULAR | Status: AC
  Filled 2016-07-03: qty 2

## 2016-07-03 MED ORDER — IOPAMIDOL (ISOVUE-370) INJECTION 76%
INTRAVENOUS | Status: AC
  Filled 2016-07-03: qty 100

## 2016-07-03 MED ORDER — SODIUM CHLORIDE 0.9 % WEIGHT BASED INFUSION: 1.0000 mL/kg/h | INTRAVENOUS | Status: AC

## 2016-07-03 MED ORDER — SODIUM CHLORIDE 0.9 % IV SOLN
250.0000 mg | INTRAVENOUS | Status: DC | PRN
  Administered 2016-07-03: 1.75 mg/kg/h via INTRAVENOUS

## 2016-07-03 MED ORDER — BIVALIRUDIN BOLUS VIA INFUSION - CUPID
INTRAVENOUS | Status: DC | PRN
  Administered 2016-07-03: 54.3 mg via INTRAVENOUS

## 2016-07-03 MED ORDER — LIDOCAINE HCL (PF) 1 % IJ SOLN
INTRAMUSCULAR | Status: DC | PRN
  Administered 2016-07-03: 20 mL

## 2016-07-03 MED ORDER — HEPARIN (PORCINE) IN NACL 2-0.9 UNIT/ML-% IJ SOLN
INTRAMUSCULAR | Status: AC
  Filled 2016-07-03: qty 1000

## 2016-07-03 SURGICAL SUPPLY — 19 items
BALLN EMERGE MR 2.0X12 (BALLOONS) ×2
BALLN ~~LOC~~ EMERGE MR 2.5X8 (BALLOONS) ×2
BALLOON EMERGE MR 2.0X12 (BALLOONS) IMPLANT
BALLOON ~~LOC~~ EMERGE MR 2.5X8 (BALLOONS) IMPLANT
CATH INFINITI 5FR MULTPACK ANG (CATHETERS) ×1 IMPLANT
GUIDE CATH RUNWAY 6FR CLS3 (CATHETERS) ×1 IMPLANT
GUIDE CATH RUNWAY 6FR CLS3.5 (CATHETERS) ×1 IMPLANT
KIT ENCORE 26 ADVANTAGE (KITS) ×1 IMPLANT
KIT HEART LEFT (KITS) ×3 IMPLANT
PACK CARDIAC CATHETERIZATION (CUSTOM PROCEDURE TRAY) ×2 IMPLANT
SHEATH PINNACLE 5F 10CM (SHEATH) ×1 IMPLANT
SHEATH PINNACLE 6F 10CM (SHEATH) ×1 IMPLANT
STENT SYNERGY DES 2.25X12 (Permanent Stent) ×1 IMPLANT
SYR MEDRAD MARK V 150ML (SYRINGE) ×2 IMPLANT
TRANSDUCER W/STOPCOCK (MISCELLANEOUS) ×2 IMPLANT
VALVE GUARDIAN II ~~LOC~~ HEMO (MISCELLANEOUS) ×1 IMPLANT
WIRE ASAHI PROWATER 180CM (WIRE) ×1 IMPLANT
WIRE EMERALD 3MM-J .035X150CM (WIRE) ×1 IMPLANT
WIRE SAFE-T 1.5MM-J .035X260CM (WIRE) ×1 IMPLANT

## 2016-07-03 NOTE — Care Management Note (Signed)
Case Management Note  Patient Details  Name: Heidi Wolf MRN: [id number] Date of Birth: Aug 19, 1941  Subjective/Objective:                 Patient in for scheduled L heart cath. LAD stented. On ASA and Plavix.   Action/Plan:  No anticoag assistance needed. Anticipate DC to home self care.  Expected Discharge Date:                  Expected Discharge Plan:  [place]  In-House Referral:     Discharge planning Services  CM Consult  Post Acute Care Choice:    Choice offered to:     DME Arranged:    DME Agency:     HH Arranged:    [place] Agency:     Status of Service:  In process, will continue to follow  If discussed at Long Length of Stay Meetings, dates discussed:    Additional Comments:  Carles Collet, RN 07/03/2016, 12:42 PM

## 2016-07-03 NOTE — Progress Notes (Signed)
Site area: right groin  Site Prior to Removal:  Level 0  Pressure Applied For 20 MINUTES    Minutes Beginning at 1250  Manual:   Yes.    Patient Status During Pull:  stable  Post Pull Groin Site:  Level 0  Post Pull Instructions Given:  Yes.    Post Pull Pulses Present:  Yes.    Dressing Applied:  Yes.    Comments:  Checked frequently throughout shift with no change in assessment noted

## 2016-07-03 NOTE — Interval H&P Note (Signed)
History and Physical Interval Note:  07/03/2016 7:30 AM  Heidi Wolf  has presented today for surgery, with the diagnosis of Unstable Angina  The various methods of treatment have been discussed with the patient and family. After consideration of risks, benefits and other options for treatment, the patient has consented to  Procedure(s): Left Heart Cath and Coronary Angiography (N/A) as a surgical intervention .  The patient's history has been reviewed, patient examined, no change in status, stable for surgery.  I have reviewed the patient's chart and labs.  Questions were answered to the patient's satisfaction.     Verdean Murin S

## 2016-07-03 NOTE — Plan of Care (Signed)
Problem: Consults Goal: Cardiac Cath Patient Education (See Patient Education module for education specifics.)  Outcome: Progressing The client is scheduled to have a heart cath today at 7:30 am. The consent is signed, pre procedure teaching is complete, pre procedure fluid is infusing and prep is done.

## 2016-07-04 LAB — CBC
HCT: 40.8 % (ref 36.0–46.0)
Hemoglobin: 13.5 g/dL (ref 12.0–15.0)
MCH: 26.9 pg (ref 26.0–34.0)
MCHC: 33.1 g/dL (ref 30.0–36.0)
MCV: 81.4 fL (ref 78.0–100.0)
Platelets: 232 10*3/uL (ref 150–400)
RBC: 5.01 MIL/uL (ref 3.87–5.11)
RDW: 14.4 % (ref 11.5–15.5)
WBC: 10.9 10*3/uL — ABNORMAL HIGH (ref 4.0–10.5)

## 2016-07-04 LAB — BASIC METABOLIC PANEL
Anion gap: 7 (ref 5–15)
BUN: 12 mg/dL (ref 6–20)
CO2: 26 mmol/L (ref 22–32)
Calcium: 9.3 mg/dL (ref 8.9–10.3)
Chloride: 104 mmol/L (ref 101–111)
Creatinine, Ser: 1.12 mg/dL — ABNORMAL HIGH (ref 0.44–1.00)
GFR calc Af Amer: 54 mL/min — ABNORMAL LOW (ref >60)
GFR calc non Af Amer: 47 mL/min — ABNORMAL LOW (ref >60)
Glucose, Bld: 116 mg/dL — ABNORMAL HIGH (ref 65–99)
Potassium: 3.5 mmol/L (ref 3.5–5.1)
Sodium: 137 mmol/L (ref 135–145)

## 2016-07-04 LAB — CK TOTAL AND CKMB (NOT AT ARMC)
CK, MB: 9.4 ng/mL — ABNORMAL HIGH (ref 0.5–5.0)
Relative Index: 4.6 — ABNORMAL HIGH (ref 0.0–2.5)
Total CK: 205 U/L (ref 38–234)

## 2016-07-04 LAB — GLUCOSE, CAPILLARY
Glucose-Capillary: 117 mg/dL — ABNORMAL HIGH (ref 65–99)
Glucose-Capillary: 106 mg/dL — ABNORMAL HIGH (ref 65–99)

## 2016-07-04 LAB — TROPONIN I
Troponin I: 0.36 ng/mL (ref <0.03)
Troponin I: 0.27 ng/mL (ref <0.03)

## 2016-07-04 MED ORDER — ANGIOPLASTY BOOK
Freq: Once | Status: AC
  Administered 2016-07-04: 04:00:00
  Filled 2016-07-04: qty 1

## 2016-07-04 MED ORDER — CLOPIDOGREL BISULFATE 75 MG PO TABS: 75.0000 mg | ORAL_TABLET | Freq: Every day | ORAL | Status: DC

## 2016-07-04 MED ORDER — PANTOPRAZOLE SODIUM 40 MG PO TBEC: 40.0000 mg | DELAYED_RELEASE_TABLET | Freq: Every day | ORAL | Status: DC

## 2016-07-04 MED ORDER — ISOSORBIDE MONONITRATE ER 60 MG PO TB24: 60.0000 mg | ORAL_TABLET | Freq: Every day | ORAL | Status: DC

## 2016-07-04 MED ORDER — ASPIRIN EC 81 MG PO TBEC: 81.0000 mg | DELAYED_RELEASE_TABLET | Freq: Every day | ORAL | Status: AC

## 2016-07-04 NOTE — Progress Notes (Signed)
I was called by the nurse to evaluate Ms Heidi Wolf. She had a DES to a diagonal branch placed last morning. She woke up at 2 am complaining of sharp retrosternal pain. Different in quality than her previous pain. Her pain improved with oxy and nitro. Her ECG shows dynamic T wave change sin lateral leads which she had before the cath. Discussed findings with Dr. Gwenlyn Found. Given that she is now symptomatic will hold of repeat LHC for now but will keep an eye on it. Ordered Trop (0.36) and repeat ECG.   Cristina Gong, MD

## 2016-07-04 NOTE — Discharge Instructions (Addendum)
°  STOP TAKING THE FOLLOWING MEDICATIONS: ° °OMEPRAZOLE (PRILOSEC) °

## 2016-07-04 NOTE — Progress Notes (Signed)
CARDIAC REHAB PHASE I   PRE:  Rate/Rhythm: 102 ST    BP: sitting 181/81    SaO2:   MODE:  Ambulation: 460 ft   POST:  Rate/Rhythm: 114 ST    BP: sitting 205/71, after 25 min 162/68    SaO2:   Pt getting dressed in room then able to walk slowly down hall, liked to hold to armrail. Sts her CP is 1/10 this am, under left breast. Sts its barely noticeable. Ed completed with pt and daughter. Good reception although she is reluctant to [place] she gets enough ex around house and walking at [place]. Will send referral and she can talk with Dr. Doylene Canard. Understands Plavix.  [place], ACSM 07/04/2016 9:01 AM

## 2016-07-04 NOTE — Discharge Summary (Signed)
Physician Discharge Summary  Patient ID: Heidi Wolf MRN: [id number] DOB/AGE: 07-29-1941 75 y.o.  Admit date: 07/02/2016 Discharge date: 07/04/2016  Admission Diagnoses: Acute coronary syndrome DM, II  Hypertension  Hyperlipidemia  Cervical disc disease Anxiety  Discharge Diagnoses:  Principal Problem:   * Acute coronary syndrome * Active Problems:   S/P Diagonal branch of LAD stent   DM, II    Hypertension    Hyperlipidemia    Cervical disc disease   Anxiety  Discharged Condition: good  Hospital Course: 75 year old female with diabetes, type II, hypertension, cervical disc disease and hypercholesterolemia has 2 day history of recurrent chest pain, tightness, across chest starting from right side to left side of chest, responding to NTG intake. She underwent cardiac catheterization showing 90 % Diagonal branch near osteal lesion. 2.25 x 12 mm Synergy drug eluting stent was placed by Dr. Irish Lack with good result. Patient had some chest discomfort and minimal troponin-I elevation post procedure but overall did good with ambulation follow diet and activity guidelines and will see me in one week.  Consults: cardiology  Significant Diagnostic Studies: labs: Normal CBC, BMET, Mildly elevated LDL and total cholesterol.  EKG-Sinus bradycardia with lateral wall ischemia.  Coronary angiography: Critical osteal to proximal 90 % diagonal 2 branch of LAD stenosis treated with 2.25 mm x 12 mm synergy Stent with very good result.  Treatments: cardiac meds: benazepril (Lotensin), aspirin, Plavix, amlodipine and atorvastatin and procedures: Cardiac catheterization with drug eluting stent placement.  Discharge Exam: Blood pressure 162/60, pulse 62, temperature 97.7 F (36.5 C), temperature source Oral, resp. rate 16, height 5\' 4"  (1.626 m), weight 74.8 kg (164 lb 14.5 oz), SpO2 96 %. GENERAL: The patient is short and well nourished black female in mild distress.  HEENT: The patient is  normocephalic, atraumatic. She wears glasses. Has brown eyes. Conjunctivae pink. Sclera-non-icteric.  NECK: No JVD. Positive faint bruit.  LUNGS: Clear bilaterally.  HEART: Normal S1 and S2. III/VI systolic murmur.  ABDOMEN: Soft and nontender.  EXTREMITIES: No edema, cyanosis, or clubbing. No right groin hematoma. SKIN: Warm and dry.  NEUROLOGICAL: The patient moves all 4 extremities. Cranial nerves grossly intact. Speech is normal.   Disposition: 01-Home or Self Care  Discharge Instructions    Amb Referral to Cardiac Rehabilitation    Complete by:  As directed   Diagnosis:   PTCA Coronary Stents              Medication List    TAKE these medications        amLODipine 5 MG tablet  Commonly known as:  NORVASC  Take 1 tablet (5 mg total) by mouth daily.     aspirin EC 81 MG tablet  Take 81 mg by mouth every other day.     atorvastatin 40 MG tablet  Commonly known as:  LIPITOR  Take 40 mg by mouth daily.     benazepril 20 MG tablet  Commonly known as:  LOTENSIN  Take 20 mg by mouth daily.     cholecalciferol 1000 units tablet  Commonly known as:  VITAMIN D  Take 1,000 Units by mouth every other day.     clopidogrel 75 MG tablet  Commonly known as:  PLAVIX  Take 1 tablet (75 mg total) by mouth daily with breakfast.     glipiZIDE 2.5 MG 24 hr tablet  Commonly known as:  GLUCOTROL XL  Take 2.5 mg by mouth daily.     isosorbide mononitrate 60 MG  24 hr tablet  Commonly known as:  IMDUR  Take 1 tablet (60 mg total) by mouth daily.     loratadine 10 MG tablet  Commonly known as:  CLARITIN  Take 10 mg by mouth daily as needed for allergies.     LUMIGAN 0.01 % Soln  Generic drug:  bimatoprost  Place 1 drop into both eyes at bedtime.     metoprolol tartrate 25 MG tablet  Commonly known as:  LOPRESSOR  Take 25 mg by mouth daily.     nitroGLYCERIN 0.4 MG SL tablet  Commonly known as:  NITROSTAT  Place 0.4 mg under the tongue every 5 (five) minutes as  needed for chest pain.     omeprazole 20 MG capsule  Commonly known as:  PRILOSEC  Take 20 mg by mouth daily as needed (for acid reflux).           Follow-up Information    Follow up with [facility], MD. Schedule an appointment as soon as possible for a visit in 1 week.   Specialty:  Cardiology   Contact information:   [place] [address] [phone]       Signed: Birdie Riddle 07/04/2016, 1:45 PM

## 2016-07-04 NOTE — Progress Notes (Signed)
Pt c/o cp, nitro x 3 given, oxygen @ 4L started, Oxycodone 5mg  given, Bensimhonl, MD paged, orders given, will continue to monitor pt closely.  Pain started at 4 in central L lateral anterior chest, now a 1.

## 2016-07-08 DIAGNOSIS — I10 Essential (primary) hypertension: Secondary | ICD-10-CM | POA: Diagnosis not present

## 2016-07-08 DIAGNOSIS — M25561 Pain in right knee: Secondary | ICD-10-CM | POA: Diagnosis not present

## 2016-07-08 DIAGNOSIS — R51 Headache: Secondary | ICD-10-CM | POA: Diagnosis not present

## 2016-07-08 DIAGNOSIS — R002 Palpitations: Secondary | ICD-10-CM | POA: Diagnosis not present

## 2016-07-08 DIAGNOSIS — M25562 Pain in left knee: Secondary | ICD-10-CM | POA: Diagnosis not present

## 2016-07-08 DIAGNOSIS — E119 Type 2 diabetes mellitus without complications: Secondary | ICD-10-CM | POA: Diagnosis not present

## 2016-07-09 ENCOUNTER — Emergency Department (HOSPITAL_COMMUNITY): Payer: Medicare Other

## 2016-07-09 ENCOUNTER — Encounter (HOSPITAL_COMMUNITY): Payer: Self-pay

## 2016-07-09 ENCOUNTER — Emergency Department (HOSPITAL_COMMUNITY)
Admission: EM | Admit: 2016-07-09 | Discharge: 2016-07-09 | Disposition: A | Discharge status: Home / Self Care | Payer: Medicare Other | Attending: Emergency Medicine | Admitting: Emergency Medicine

## 2016-07-09 DIAGNOSIS — Z7982 Long term (current) use of aspirin: Secondary | ICD-10-CM | POA: Diagnosis not present

## 2016-07-09 DIAGNOSIS — R0789 Other chest pain: Secondary | ICD-10-CM | POA: Insufficient documentation

## 2016-07-09 DIAGNOSIS — E119 Type 2 diabetes mellitus without complications: Secondary | ICD-10-CM | POA: Diagnosis not present

## 2016-07-09 DIAGNOSIS — Z79899 Other long term (current) drug therapy: Secondary | ICD-10-CM | POA: Insufficient documentation

## 2016-07-09 DIAGNOSIS — Z955 Presence of coronary angioplasty implant and graft: Secondary | ICD-10-CM | POA: Diagnosis not present

## 2016-07-09 DIAGNOSIS — I10 Essential (primary) hypertension: Secondary | ICD-10-CM | POA: Diagnosis not present

## 2016-07-09 DIAGNOSIS — R112 Nausea with vomiting, unspecified: Secondary | ICD-10-CM | POA: Diagnosis not present

## 2016-07-09 DIAGNOSIS — Z7984 Long term (current) use of oral hypoglycemic drugs: Secondary | ICD-10-CM | POA: Diagnosis not present

## 2016-07-09 DIAGNOSIS — R197 Diarrhea, unspecified: Secondary | ICD-10-CM | POA: Insufficient documentation

## 2016-07-09 DIAGNOSIS — Z7901 Long term (current) use of anticoagulants: Secondary | ICD-10-CM | POA: Diagnosis not present

## 2016-07-09 DIAGNOSIS — R079 Chest pain, unspecified: Secondary | ICD-10-CM | POA: Diagnosis not present

## 2016-07-09 DIAGNOSIS — K2970 Gastritis, unspecified, without bleeding: Secondary | ICD-10-CM | POA: Diagnosis not present

## 2016-07-09 LAB — CBC
HCT: 38.1 % (ref 36.0–46.0)
Hemoglobin: 12.5 g/dL (ref 12.0–15.0)
MCH: 26.7 pg (ref 26.0–34.0)
MCHC: 32.8 g/dL (ref 30.0–36.0)
MCV: 81.2 fL (ref 78.0–100.0)
Platelets: 256 10*3/uL (ref 150–400)
RBC: 4.69 MIL/uL (ref 3.87–5.11)
RDW: 14 % (ref 11.5–15.5)
WBC: 9.5 10*3/uL (ref 4.0–10.5)

## 2016-07-09 LAB — COMPREHENSIVE METABOLIC PANEL
ALT: 18 U/L (ref 14–54)
AST: 21 U/L (ref 15–41)
Albumin: 3.6 g/dL (ref 3.5–5.0)
Alkaline Phosphatase: 60 U/L (ref 38–126)
Anion gap: 8 (ref 5–15)
BUN: 9 mg/dL (ref 6–20)
CO2: 25 mmol/L (ref 22–32)
Calcium: 9.3 mg/dL (ref 8.9–10.3)
Chloride: 108 mmol/L (ref 101–111)
Creatinine, Ser: 0.98 mg/dL (ref 0.44–1.00)
GFR calc Af Amer: >60 mL/min (ref >60)
GFR calc non Af Amer: 55 mL/min — ABNORMAL LOW (ref >60)
Glucose, Bld: 119 mg/dL — ABNORMAL HIGH (ref 65–99)
Potassium: 3.3 mmol/L — ABNORMAL LOW (ref 3.5–5.1)
Sodium: 141 mmol/L (ref 135–145)
Total Bilirubin: 0.5 mg/dL (ref 0.3–1.2)
Total Protein: 7.2 g/dL (ref 6.5–8.1)

## 2016-07-09 LAB — I-STAT TROPONIN, ED
Troponin i, poc: 0.04 ng/mL (ref 0.00–0.08)
Troponin i, poc: 0.02 ng/mL (ref 0.00–0.08)

## 2016-07-09 LAB — LIPASE, BLOOD: Lipase: 24 U/L (ref 11–51)

## 2016-07-09 MED ORDER — ONDANSETRON HCL 4 MG PO TABS: 4.0000 mg | ORAL_TABLET | Freq: Three times a day (TID) | ORAL | Status: DC | PRN

## 2016-07-09 MED ORDER — ACETAMINOPHEN 500 MG PO TABS
500.0000 mg | ORAL_TABLET | Freq: Once | ORAL | Status: AC
  Administered 2016-07-09: 500 mg via ORAL
  Filled 2016-07-09: qty 1

## 2016-07-09 MED ORDER — GI COCKTAIL ~~LOC~~
30.0000 mL | Freq: Once | ORAL | Status: AC
  Administered 2016-07-09: 30 mL via ORAL
  Filled 2016-07-09: qty 30

## 2016-07-09 MED ORDER — ONDANSETRON HCL 4 MG/2ML IJ SOLN
4.0000 mg | Freq: Once | INTRAMUSCULAR | Status: AC
  Administered 2016-07-09: 4 mg via INTRAVENOUS
  Filled 2016-07-09: qty 2

## 2016-07-09 MED ORDER — SODIUM CHLORIDE 0.9 % IV BOLUS (SEPSIS)
500.0000 mL | Freq: Once | INTRAVENOUS | Status: AC
  Administered 2016-07-09: 500 mL via INTRAVENOUS

## 2016-07-09 NOTE — ED Notes (Signed)
Dr. Ayesha Rumpf approved pt. To take normal at home meds in the room

## 2016-07-09 NOTE — ED Notes (Signed)
D/C instructions reviewed with patient. Medications reviewed and pat. Denies questions and pain at this time. Pt. Awaiting family to return with clothes and they will be taking her home.

## 2016-07-09 NOTE — ED Provider Notes (Signed)
CSN: [id number]     Arrival date & time 07/09/16  0719 History   First MD Initiated Contact with Patient 07/09/16 [phone]     Chief Complaint  Patient presents with  . Chest Pain     Patient is a 75 y.o. female presenting with chest pain. The history is provided by the patient. No language interpreter was used.  Chest Pain  Heidi Wolf is a 75 y.o. female who presents to the Emergency Department complaining of Chest pain and vomiting. She had a stent placed one week ago. She was doing well since also discharge. 2 days ago she developed diarrhea and last night at 10:30 she developed vomiting. After her vomiting she developed palpitations in her chest in the central chest burning sensation. He fevers or abdominal pain. Symptoms are moderate and constant in nature.  Past Medical History  Diagnosis Date  . Hypertension   . Glaucoma   . Hyperlipidemia   . Cataracts, both eyes   . Colon polyps     Hyperplastic 2003  . Internal hemorrhoid   . External hemorrhoid   . Personal history of colonic polyps 03/03/2012  . Anemia   . Heart murmur   . GERD (gastroesophageal reflux disease)   . Arthritis     "right hip" (09/21/2014)  . Anxiety   . Chest pain 07/01/2016  . Type II diabetes mellitus ([place])     type 2   Past Surgical History  Procedure Laterality Date  . Tubal ligation  1971  . Cholecystectomy    . Colonoscopy    . Abdominal hysterectomy    . Appendectomy      "w/gallbladder"  . Cardiac catheterization  X 2  . Cardiac catheterization N/A 07/03/2016    Procedure: Left Heart Cath and Coronary Angiography;  Surgeon: Dixie Dials, MD;  Location: [place] [facility];  Service: Cardiovascular;  Laterality: N/A;  . Cardiac catheterization N/A 07/03/2016    Procedure: Coronary Stent Intervention;  Surgeon: Jettie Booze, MD;  Location: [place] [facility];  Service: Cardiovascular;  Laterality: N/A;   Family History  Problem Relation Age of Onset  . Colon cancer Neg Hx   . Stomach  cancer Neg Hx   . Heart disease Mother   . Diabetes Mother   . Arthritis Mother   . Heart disease Brother   . Heart disease Sister     x 2  . Stroke Brother   . Stroke Daughter   . Diabetes Maternal Grandmother   . Stroke Maternal Grandmother   . Stroke Father   . Breast cancer      niece   Social History  Substance Use Topics  . Smoking status: Never Smoker   . Smokeless tobacco: Never Used  . Alcohol Use: No   OB History    No data available     Review of Systems  Cardiovascular: Positive for chest pain.  All other systems reviewed and are negative.     Allergies  Aspirin  Home Medications   Prior to Admission medications   Medication Sig Start Date End Date Taking? Authorizing Provider  acetaminophen (TYLENOL) 325 MG tablet Take 650 mg by mouth every 6 (six) hours as needed for headache.   Yes Historical Provider, MD  amLODipine (NORVASC) 5 MG tablet Take 1 tablet (5 mg total) by mouth daily. 01/17/14  Yes Dixie Dials, MD  aspirin EC 81 MG tablet Take 1 tablet (81 mg total) by mouth daily. 07/04/16  Yes Ajay  Doylene Canard, MD  atorvastatin (LIPITOR) 40 MG tablet Take 40 mg by mouth daily.   Yes Historical Provider, MD  benazepril (LOTENSIN) 20 MG tablet Take 20 mg by mouth daily.   Yes Historical Provider, MD  cholecalciferol (VITAMIN D) 1000 UNITS tablet Take 1,000 Units by mouth every other day.    Yes Historical Provider, MD  clopidogrel (PLAVIX) 75 MG tablet Take 1 tablet (75 mg total) by mouth daily with breakfast. 07/04/16  Yes Dixie Dials, MD  glipiZIDE (GLUCOTROL XL) 2.5 MG 24 hr tablet Take 2.5 mg by mouth daily.   Yes Historical Provider, MD  isosorbide mononitrate (IMDUR) 60 MG 24 hr tablet Take 1 tablet (60 mg total) by mouth daily. 07/04/16  Yes Dixie Dials, MD  loperamide (IMODIUM) 2 MG capsule Take 4 mg by mouth as needed for diarrhea or loose stools.   Yes Historical Provider, MD  loratadine (CLARITIN) 10 MG tablet Take 10 mg by mouth daily as needed for  allergies.    Yes Historical Provider, MD  LUMIGAN 0.01 % SOLN Place 1 drop into both eyes at bedtime.  04/13/14  Yes Historical Provider, MD  metoprolol tartrate (LOPRESSOR) 25 MG tablet Take 25 mg by mouth daily.    Yes Historical Provider, MD  nitroGLYCERIN (NITROSTAT) 0.4 MG SL tablet Place 0.4 mg under the tongue every 5 (five) minutes as needed for chest pain.    Yes Historical Provider, MD  pantoprazole (PROTONIX) 40 MG tablet Take 1 tablet (40 mg total) by mouth daily. 07/04/16  Yes Dixie Dials, MD  ondansetron (ZOFRAN) 4 MG tablet Take 1 tablet (4 mg total) by mouth every 8 (eight) hours as needed for nausea or vomiting. 07/09/16   Heidi Reichert, MD   BP 167/80 mmHg  Pulse 54  Temp(Src) 98.3 F (36.8 C) (Oral)  Resp 20  SpO2 100% Physical Exam  Constitutional: She is oriented to person, place, and time. She appears well-developed and well-nourished.  HENT:  Head: Normocephalic and atraumatic.  Cardiovascular: Normal rate and regular rhythm.   No murmur heard. Pulmonary/Chest: Effort normal and breath sounds normal. No respiratory distress.  Abdominal: Soft. There is no rebound and no guarding.  Mild diffuse abdominal tenderness  Musculoskeletal: She exhibits no edema or tenderness.  Neurological: She is alert and oriented to person, place, and time.  Skin: Skin is warm and dry.  Psychiatric: She has a normal mood and affect. Her behavior is normal.  Nursing note and vitals reviewed.   ED Course  Procedures (including critical care time) Labs Review Labs Reviewed  COMPREHENSIVE METABOLIC PANEL - Abnormal; Notable for the following:    Potassium 3.3 (*)    Glucose, Bld 119 (*)    GFR calc non Af Amer 55 (*)    All other components within normal limits  CBC  LIPASE, BLOOD  I-STAT TROPOININ, ED  I-STAT TROPOININ, ED    Imaging Review Dg Chest 2 View  07/09/2016  CLINICAL DATA:  Chest pain and burning since yesterday, nausea, history hypertension, GERD, type II  diabetes mellitus EXAM: CHEST  2 VIEW COMPARISON:  Since 05/01/2014 FINDINGS: Enlargement of cardiac silhouette with pulmonary vascular congestion. Atherosclerotic calcification aorta. Mediastinal contours normal. Azygos fissure noted. Minimal bibasilar atelectasis. Lungs otherwise clear. No pleural effusion or pneumothorax. Multilevel endplate spur formation thoracic spine. Osseous mineralization normal. IMPRESSION: Enlargement of cardiac silhouette with minimal bibasilar atelectasis. Electronically Signed   By: Lavonia Dana M.D.   On: 07/09/2016 08:10   I have personally reviewed and  evaluated these images and lab results as part of my medical decision-making.   EKG Interpretation   Date/Time:  Wednesday July 09 2016 07:35:26 EDT Ventricular Rate:  78 PR Interval:    QRS Duration: 93 QT Interval:  390 QTC Calculation: 445 R Axis:   57 Text Interpretation:  Sinus rhythm Probable LVH with secondary repol abnrm  Confirmed by Hazle Coca [phone]) on 07/09/2016 7:42:38 AM Also confirmed by  Hazle Coca [phone]), editor Erichsen, CCT, [place] ([id number])  on 07/09/2016  8:39:34 AM      MDM   Final diagnoses:  Nausea vomiting and diarrhea   Patient here for evaluation of nausea, vomiting, chest discomfort following stent placement a week ago. She is in no distress on examination and her chest pain resolved after GI cocktail. She has a benign abdominal examination. She is tolerating oral fluids in the emergency department. Presentation is not consistent with ACS, PE, dissection. Patient with likely viral gastric illness that is resolving in the department. Discussed with patient home care for gastritis and vomiting. Discussed importance of continuing her Plavix and Protonix. Discussed outpatient follow-up and return precautions.    Heidi Reichert, MD 07/09/16 [phone]

## 2016-07-09 NOTE — ED Notes (Signed)
Patient was placed back onto monitor after returning back from xray.

## 2016-07-09 NOTE — ED Notes (Signed)
Pt. Requested ginger ale for fluid challenge test. Ginger ale given

## 2016-07-09 NOTE — Discharge Instructions (Signed)
Continue taking your plavix and protonix as prescribed.  You can stop taking the Imdur if you develop headaches or nausea.  Get rechecked immediately if you develop any new or worrisome symptoms.     Nausea and Vomiting Nausea is a sick feeling that often comes before throwing up (vomiting). Vomiting is a reflex where stomach contents come out of your mouth. Vomiting can cause severe loss of body fluids (dehydration). Children and elderly adults can become dehydrated quickly, especially if they also have diarrhea. Nausea and vomiting are symptoms of a condition or disease. It is important to find the cause of your symptoms. CAUSES   Direct irritation of the stomach lining. This irritation can result from increased acid production (gastroesophageal reflux disease), infection, food poisoning, taking certain medicines (such as nonsteroidal anti-inflammatory drugs), alcohol use, or tobacco use.  Signals from the brain.These signals could be caused by a headache, heat exposure, an inner ear disturbance, increased pressure in the brain from injury, infection, a tumor, or a concussion, pain, emotional stimulus, or metabolic problems.  An obstruction in the gastrointestinal tract (bowel obstruction).  Illnesses such as diabetes, hepatitis, gallbladder problems, appendicitis, kidney problems, cancer, sepsis, atypical symptoms of a heart attack, or eating disorders.  Medical treatments such as chemotherapy and radiation.  Receiving medicine that makes you sleep (general anesthetic) during surgery. DIAGNOSIS Your caregiver may ask for tests to be done if the problems do not improve after a few days. Tests may also be done if symptoms are severe or if the reason for the nausea and vomiting is not clear. Tests may include:  Urine tests.  Blood tests.  Stool tests.  Cultures (to look for evidence of infection).  X-rays or other imaging studies. Test results can help your caregiver make decisions  about treatment or the need for additional tests. TREATMENT You need to stay well hydrated. Drink frequently but in small amounts.You may wish to drink water, sports drinks, clear broth, or eat frozen ice pops or gelatin dessert to help stay hydrated.When you eat, eating slowly may help prevent nausea.There are also some antinausea medicines that may help prevent nausea. HOME CARE INSTRUCTIONS   Take all medicine as directed by your caregiver.  If you do not have an appetite, do not force yourself to eat. However, you must continue to drink fluids.  If you have an appetite, eat a normal diet unless your caregiver tells you differently.  Eat a variety of complex carbohydrates (rice, wheat, potatoes, bread), lean meats, yogurt, fruits, and vegetables.  Avoid high-fat foods because they are more difficult to digest.  Drink enough water and fluids to keep your urine clear or pale yellow.  If you are dehydrated, ask your caregiver for specific rehydration instructions. Signs of dehydration may include:  Severe thirst.  Dry lips and mouth.  Dizziness.  Dark urine.  Decreasing urine frequency and amount.  Confusion.  Rapid breathing or pulse. SEEK IMMEDIATE MEDICAL CARE IF:   You have blood or brown flecks (like coffee grounds) in your vomit.  You have black or bloody stools.  You have a severe headache or stiff neck.  You are confused.  You have severe abdominal pain.  You have chest pain or trouble breathing.  You do not urinate at least once every 8 hours.  You develop cold or clammy skin.  You continue to vomit for longer than 24 to 48 hours.  You have a fever. MAKE SURE YOU:   Understand these instructions.  Will watch  your condition.  Will get help right away if you are not doing well or get worse.   This information is not intended to replace advice given to you by your health care provider. Make sure you discuss any questions you have with your health  care provider.   Document Released: 12/15/2005 Document Revised: 03/08/2012 Document Reviewed: 05/14/2011 Elsevier Interactive Patient Education [employer].

## 2016-07-09 NOTE — ED Notes (Signed)
Pt BIB [facility] for evaluation of central chest/epigastric burning starting this AM. Pt. Did have stents placed on 07/03/16. Pt. Was seen by PCP yesterday for diarrhea. Pt. Given 324 ASA, 1 Nitro with no pain improvement, and 4 mg zofran.

## 2016-07-14 ENCOUNTER — Inpatient Hospital Stay (HOSPITAL_COMMUNITY)
Admission: AD | Admit: 2016-07-14 | Discharge: 2016-07-17 | DRG: 282 | Disposition: A | Discharge status: Home / Self Care | Payer: Medicare Other | Source: Ambulatory Visit | Attending: Cardiovascular Disease | Admitting: Cardiovascular Disease

## 2016-07-14 ENCOUNTER — Encounter (HOSPITAL_COMMUNITY): Payer: Self-pay | Admitting: General Practice

## 2016-07-14 DIAGNOSIS — E785 Hyperlipidemia, unspecified: Secondary | ICD-10-CM | POA: Diagnosis present

## 2016-07-14 DIAGNOSIS — F419 Anxiety disorder, unspecified: Secondary | ICD-10-CM | POA: Diagnosis present

## 2016-07-14 DIAGNOSIS — I10 Essential (primary) hypertension: Secondary | ICD-10-CM | POA: Diagnosis present

## 2016-07-14 DIAGNOSIS — Z823 Family history of stroke: Secondary | ICD-10-CM

## 2016-07-14 DIAGNOSIS — Z9861 Coronary angioplasty status: Secondary | ICD-10-CM | POA: Diagnosis not present

## 2016-07-14 DIAGNOSIS — I2510 Atherosclerotic heart disease of native coronary artery without angina pectoris: Secondary | ICD-10-CM | POA: Diagnosis present

## 2016-07-14 DIAGNOSIS — Z886 Allergy status to analgesic agent status: Secondary | ICD-10-CM

## 2016-07-14 DIAGNOSIS — Z955 Presence of coronary angioplasty implant and graft: Secondary | ICD-10-CM

## 2016-07-14 DIAGNOSIS — E119 Type 2 diabetes mellitus without complications: Secondary | ICD-10-CM | POA: Diagnosis present

## 2016-07-14 DIAGNOSIS — Z833 Family history of diabetes mellitus: Secondary | ICD-10-CM

## 2016-07-14 DIAGNOSIS — R079 Chest pain, unspecified: Secondary | ICD-10-CM | POA: Diagnosis present

## 2016-07-14 DIAGNOSIS — E784 Other hyperlipidemia: Secondary | ICD-10-CM | POA: Diagnosis not present

## 2016-07-14 DIAGNOSIS — Z7984 Long term (current) use of oral hypoglycemic drugs: Secondary | ICD-10-CM

## 2016-07-14 DIAGNOSIS — Z8249 Family history of ischemic heart disease and other diseases of the circulatory system: Secondary | ICD-10-CM

## 2016-07-14 DIAGNOSIS — H409 Unspecified glaucoma: Secondary | ICD-10-CM | POA: Diagnosis present

## 2016-07-14 DIAGNOSIS — Z7902 Long term (current) use of antithrombotics/antiplatelets: Secondary | ICD-10-CM

## 2016-07-14 DIAGNOSIS — I214 Non-ST elevation (NSTEMI) myocardial infarction: Principal | ICD-10-CM | POA: Diagnosis present

## 2016-07-14 DIAGNOSIS — Z79899 Other long term (current) drug therapy: Secondary | ICD-10-CM

## 2016-07-14 DIAGNOSIS — I25110 Atherosclerotic heart disease of native coronary artery with unstable angina pectoris: Secondary | ICD-10-CM | POA: Diagnosis not present

## 2016-07-14 DIAGNOSIS — Z7982 Long term (current) use of aspirin: Secondary | ICD-10-CM

## 2016-07-14 DIAGNOSIS — K219 Gastro-esophageal reflux disease without esophagitis: Secondary | ICD-10-CM | POA: Diagnosis present

## 2016-07-14 LAB — CBC WITH DIFFERENTIAL/PLATELET
Basophils Absolute: 0 10*3/uL (ref 0.0–0.1)
Basophils Relative: 0 %
Eosinophils Absolute: 0 10*3/uL (ref 0.0–0.7)
Eosinophils Relative: 0 %
HCT: 40.9 % (ref 36.0–46.0)
Hemoglobin: 13.2 g/dL (ref 12.0–15.0)
Lymphocytes Relative: 17 %
Lymphs Abs: 1.7 10*3/uL (ref 0.7–4.0)
MCH: 26.3 pg (ref 26.0–34.0)
MCHC: 32.3 g/dL (ref 30.0–36.0)
MCV: 81.5 fL (ref 78.0–100.0)
Monocytes Absolute: 0.5 10*3/uL (ref 0.1–1.0)
Monocytes Relative: 5 %
Neutro Abs: 7.8 10*3/uL — ABNORMAL HIGH (ref 1.7–7.7)
Neutrophils Relative %: 78 %
Platelets: 323 10*3/uL (ref 150–400)
RBC: 5.02 MIL/uL (ref 3.87–5.11)
RDW: 13.6 % (ref 11.5–15.5)
WBC: 10 10*3/uL (ref 4.0–10.5)

## 2016-07-14 LAB — COMPREHENSIVE METABOLIC PANEL
ALT: 17 U/L (ref 14–54)
AST: 24 U/L (ref 15–41)
Albumin: 4.1 g/dL (ref 3.5–5.0)
Alkaline Phosphatase: 57 U/L (ref 38–126)
Anion gap: 8 (ref 5–15)
BUN: 10 mg/dL (ref 6–20)
CO2: 27 mmol/L (ref 22–32)
Calcium: 9.4 mg/dL (ref 8.9–10.3)
Chloride: 104 mmol/L (ref 101–111)
Creatinine, Ser: 1.11 mg/dL — ABNORMAL HIGH (ref 0.44–1.00)
GFR calc Af Amer: 55 mL/min — ABNORMAL LOW (ref >60)
GFR calc non Af Amer: 47 mL/min — ABNORMAL LOW (ref >60)
Glucose, Bld: 106 mg/dL — ABNORMAL HIGH (ref 65–99)
Potassium: 3.3 mmol/L — ABNORMAL LOW (ref 3.5–5.1)
Sodium: 139 mmol/L (ref 135–145)
Total Bilirubin: 0.7 mg/dL (ref 0.3–1.2)
Total Protein: 7.7 g/dL (ref 6.5–8.1)

## 2016-07-14 LAB — TROPONIN I
Troponin I: 0.09 ng/mL (ref <0.03)
Troponin I: 0.98 ng/mL (ref <0.03)

## 2016-07-14 LAB — HEPARIN LEVEL (UNFRACTIONATED): Heparin Unfractionated: 0.68 IU/mL (ref 0.30–0.70)

## 2016-07-14 LAB — GLUCOSE, CAPILLARY: Glucose-Capillary: 117 mg/dL — ABNORMAL HIGH (ref 65–99)

## 2016-07-14 MED ORDER — NITROGLYCERIN 0.4 MG SL SUBL: 0.4000 mg | SUBLINGUAL_TABLET | SUBLINGUAL | Status: DC | PRN

## 2016-07-14 MED ORDER — VITAMIN D 1000 UNITS PO TABS
1000.0000 [IU] | ORAL_TABLET | ORAL | Status: DC
  Filled 2016-07-14: qty 1

## 2016-07-14 MED ORDER — SODIUM CHLORIDE 0.9% FLUSH: 3.0000 mL | INTRAVENOUS | Status: DC | PRN

## 2016-07-14 MED ORDER — PANTOPRAZOLE SODIUM 40 MG PO TBEC
40.0000 mg | DELAYED_RELEASE_TABLET | Freq: Every day | ORAL | Status: DC
  Administered 2016-07-14 – 2016-07-17 (×4): 40 mg via ORAL
  Filled 2016-07-14 (×4): qty 1

## 2016-07-14 MED ORDER — BENAZEPRIL HCL 20 MG PO TABS
20.0000 mg | ORAL_TABLET | Freq: Every day | ORAL | Status: DC
  Administered 2016-07-14 – 2016-07-17 (×4): 20 mg via ORAL
  Filled 2016-07-14 (×4): qty 1

## 2016-07-14 MED ORDER — ONDANSETRON HCL 4 MG/2ML IJ SOLN: 4.0000 mg | Freq: Four times a day (QID) | INTRAMUSCULAR | Status: DC | PRN

## 2016-07-14 MED ORDER — ACETAMINOPHEN 325 MG PO TABS
650.0000 mg | ORAL_TABLET | ORAL | Status: DC | PRN
Start: 2016-07-14 — End: 2016-07-17
  Administered 2016-07-14 – 2016-07-17 (×7): 650 mg via ORAL
  Filled 2016-07-14 (×6): qty 2

## 2016-07-14 MED ORDER — METOPROLOL TARTRATE 25 MG PO TABS
25.0000 mg | ORAL_TABLET | Freq: Every day | ORAL | Status: DC
  Administered 2016-07-14 – 2016-07-17 (×4): 25 mg via ORAL
  Filled 2016-07-14 (×4): qty 1

## 2016-07-14 MED ORDER — ATORVASTATIN CALCIUM 40 MG PO TABS
40.0000 mg | ORAL_TABLET | Freq: Every day | ORAL | Status: DC
  Administered 2016-07-14 – 2016-07-17 (×4): 40 mg via ORAL
  Filled 2016-07-14 (×5): qty 1

## 2016-07-14 MED ORDER — HEPARIN BOLUS VIA INFUSION
3000.0000 [IU] | Freq: Once | INTRAVENOUS | Status: AC
  Administered 2016-07-14: 3000 [IU] via INTRAVENOUS
  Filled 2016-07-14: qty 3000

## 2016-07-14 MED ORDER — LATANOPROST 0.005 % OP SOLN
1.0000 [drp] | Freq: Every day | OPHTHALMIC | Status: DC
  Administered 2016-07-14 – 2016-07-16 (×3): 1 [drp] via OPHTHALMIC
  Filled 2016-07-14: qty 2.5

## 2016-07-14 MED ORDER — GLIPIZIDE ER 2.5 MG PO TB24
2.5000 mg | ORAL_TABLET | Freq: Every day | ORAL | Status: DC
  Administered 2016-07-14: 2.5 mg via ORAL
  Filled 2016-07-14 (×2): qty 1

## 2016-07-14 MED ORDER — ASPIRIN EC 81 MG PO TBEC
81.0000 mg | DELAYED_RELEASE_TABLET | Freq: Every day | ORAL | Status: DC
  Administered 2016-07-15 – 2016-07-17 (×3): 81 mg via ORAL
  Filled 2016-07-14 (×5): qty 1

## 2016-07-14 MED ORDER — SODIUM CHLORIDE 0.9% FLUSH: 3.0000 mL | Freq: Two times a day (BID) | INTRAVENOUS | Status: DC

## 2016-07-14 MED ORDER — ONDANSETRON HCL 4 MG PO TABS: 4.0000 mg | ORAL_TABLET | Freq: Three times a day (TID) | ORAL | Status: DC | PRN

## 2016-07-14 MED ORDER — CLOPIDOGREL BISULFATE 75 MG PO TABS
75.0000 mg | ORAL_TABLET | Freq: Every day | ORAL | Status: DC
  Administered 2016-07-14 – 2016-07-16 (×3): 75 mg via ORAL
  Filled 2016-07-14 (×4): qty 1

## 2016-07-14 MED ORDER — HEPARIN (PORCINE) IN NACL 100-0.45 UNIT/ML-% IJ SOLN
950.0000 [IU]/h | INTRAMUSCULAR | Status: DC
  Administered 2016-07-14: 950 [IU]/h via INTRAVENOUS
  Filled 2016-07-14: qty 250

## 2016-07-14 MED ORDER — VITAMIN D 1000 UNITS PO TABS
1000.0000 [IU] | ORAL_TABLET | ORAL | Status: DC
  Administered 2016-07-15 – 2016-07-17 (×2): 1000 [IU] via ORAL
  Filled 2016-07-14 (×3): qty 1

## 2016-07-14 MED ORDER — SODIUM CHLORIDE 0.9 % IV SOLN
INTRAVENOUS | Status: DC
  Administered 2016-07-15: 01:00:00 via INTRAVENOUS

## 2016-07-14 MED ORDER — POTASSIUM CHLORIDE ER 10 MEQ PO TBCR
20.0000 meq | EXTENDED_RELEASE_TABLET | Freq: Once | ORAL | Status: AC
  Administered 2016-07-15: 20 meq via ORAL
  Filled 2016-07-14 (×2): qty 2

## 2016-07-14 MED ORDER — AMLODIPINE BESYLATE 5 MG PO TABS
5.0000 mg | ORAL_TABLET | Freq: Every day | ORAL | Status: DC
  Administered 2016-07-14 – 2016-07-17 (×4): 5 mg via ORAL
  Filled 2016-07-14 (×4): qty 1

## 2016-07-14 MED ORDER — LORATADINE 10 MG PO TABS: 10.0000 mg | ORAL_TABLET | Freq: Every day | ORAL | Status: DC | PRN

## 2016-07-14 MED ORDER — SODIUM CHLORIDE 0.9 % IV SOLN: 250.0000 mL | INTRAVENOUS | Status: DC | PRN

## 2016-07-14 NOTE — Progress Notes (Signed)
Received Pt to room 3W38 from Home accompanied by daughter Camie. Alert, oriented; reported having chest chest since 9 am; burning in nature radiating to back, shoulder; that pain getting worse,  took 3 SL NTG and 81 mg ASA with some relief. Called  " Doctor office and told to come to hospital" Phoned Dr Doylene Canard @ [phone]; notified  of patient arrival to floor. Temp.. 98.8, HR 79, O2 Sat 100%, BP 170/82, map 104, RR 16. Awaiting attending Physician for orders.

## 2016-07-14 NOTE — H&P (Signed)
Referring Physician:  [name] is an 75 y.o. female.                       Chief Complaint: Chest pain  HPI: 75 year old female with Diagonal branch of LAD stent-2weeks ago, diabetes mellitus, type II, hypertension, cervical disc disease and hypercholesterolemia has 1 week history of recurrent chest pain, tightness, across chest starting from right side to left side of chest, responding to NTG intake. No fever or cough. Some heart burn but using Protonix.   Past Medical History  Diagnosis Date  . Hypertension   . Glaucoma   . Hyperlipidemia   . Cataracts, both eyes   . Colon polyps     Hyperplastic 2003  . Internal hemorrhoid   . External hemorrhoid   . Personal history of colonic polyps 03/03/2012  . Anemia   . Heart murmur   . GERD (gastroesophageal reflux disease)   . Arthritis     "right hip" (09/21/2014)  . Anxiety   . Chest pain 07/01/2016  . Type II diabetes mellitus ([place])     type 2      Past Surgical History  Procedure Laterality Date  . Tubal ligation  1971  . Cholecystectomy    . Colonoscopy    . Abdominal hysterectomy    . Appendectomy      "w/gallbladder"  . Cardiac catheterization  X 2  . Cardiac catheterization N/A 07/03/2016    Procedure: Left Heart Cath and Coronary Angiography;  Surgeon: Dixie Dials, MD;  Location: [place] [facility];  Service: Cardiovascular;  Laterality: N/A;  . Cardiac catheterization N/A 07/03/2016    Procedure: Coronary Stent Intervention;  Surgeon: Jettie Booze, MD;  Location: [facility];  Service: Cardiovascular;  Laterality: N/A;    Family History  Problem Relation Age of Onset  . Colon cancer Neg Hx   . Stomach cancer Neg Hx   . Heart disease Mother   . Diabetes Mother   . Arthritis Mother   . Heart disease Brother   . Heart disease Sister     x 2  . Stroke Brother   . Stroke Daughter   . Diabetes Maternal Grandmother   . Stroke Maternal Grandmother   . Stroke Father   . Breast cancer      niece    Social History:  reports that she has never smoked. She has never used smokeless tobacco. She reports that she does not drink alcohol or use illicit drugs.  Allergies:  Allergies  Allergen Reactions  . Aspirin Other (See Comments)    Makes stomach burn    Medications Prior to Admission  Medication Sig Dispense Refill  . acetaminophen (TYLENOL) 325 MG tablet Take 650 mg by mouth every 6 (six) hours as needed for headache.    Marland Kitchen amLODipine (NORVASC) 5 MG tablet Take 1 tablet (5 mg total) by mouth daily. 30 tablet 1  . aspirin EC 81 MG tablet Take 1 tablet (81 mg total) by mouth daily.    Marland Kitchen atorvastatin (LIPITOR) 40 MG tablet Take 40 mg by mouth daily.    . benazepril (LOTENSIN) 20 MG tablet Take 20 mg by mouth daily.    . cholecalciferol (VITAMIN D) 1000 UNITS tablet Take 1,000 Units by mouth every other day.     . clopidogrel (PLAVIX) 75 MG tablet Take 1 tablet (75 mg total) by mouth daily with breakfast. 30 tablet 3  . glipiZIDE (GLUCOTROL XL)  2.5 MG 24 hr tablet Take 2.5 mg by mouth daily.    . isosorbide mononitrate (IMDUR) 60 MG 24 hr tablet Take 1 tablet (60 mg total) by mouth daily. 30 tablet 3  . loperamide (IMODIUM) 2 MG capsule Take 4 mg by mouth as needed for diarrhea or loose stools.    Marland Kitchen loratadine (CLARITIN) 10 MG tablet Take 10 mg by mouth daily as needed for allergies.     Marland Kitchen LUMIGAN 0.01 % SOLN Place 1 drop into both eyes at bedtime.     . metoprolol tartrate (LOPRESSOR) 25 MG tablet Take 25 mg by mouth daily.     . nitroGLYCERIN (NITROSTAT) 0.4 MG SL tablet Place 0.4 mg under the tongue every 5 (five) minutes as needed for chest pain.     Marland Kitchen ondansetron (ZOFRAN) 4 MG tablet Take 1 tablet (4 mg total) by mouth every 8 (eight) hours as needed for nausea or vomiting. 8 tablet 0  . pantoprazole (PROTONIX) 40 MG tablet Take 1 tablet (40 mg total) by mouth daily. 30 tablet 6    No results found for this or any previous visit (from the past 48 hour(s)). No results  found.  Review Of Systems Constitutional: Positive for appetite change. Negative for fever and chills.  HENT: Negative for congestion.  Eyes: Negative for visual disturbance.  Respiratory: Negative for shortness of breath.  Cardiovascular: Positive for chest pain. Negative for leg swelling.  Gastrointestinal: Positive for nausea. Negative for vomiting and abdominal pain.  Genitourinary: Negative for dysuria and flank pain.  Musculoskeletal: Positive for back pain, neck pain and neck stiffness.  Skin: Negative for rash.  Neurological: Negative for light-headedness and headaches  Blood pressure 177/100, pulse 74, temperature 98.8 F (37.1 C), temperature source Oral, resp. rate 16, SpO2 99 %. Physical Exam: GENERAL: The patient is short and averagely nourished black female in mild distress.  HEENT: The patient is normocephalic, atraumatic. She wears glasses. Has brown eyes. Conjunctivae pink. Sclera-non-icteric.  NECK: No JVD. Positive faint bruit.  LUNGS: Clear bilaterally.  HEART: Normal S1 and S2. III/VI systolic murmur.  ABDOMEN: Soft and nontender.  EXTREMITIES: No edema, cyanosis, or clubbing.  SKIN: Warm and dry.  NEUROLOGICAL: The patient moves all 4 extremities. Cranial nerves grossly intact. Speech is normal.   Assessment/Plan Chest pain R/O MI CAD with stent DM, II Hypertension  Hyperlipidemia  Cervical disc disease  Place in observation/R/O MI C.cath in AM.  Birdie Riddle, MD  07/14/2016, 1:34 PM

## 2016-07-14 NOTE — Progress Notes (Signed)
ANTICOAGULATION CONSULT NOTE - Initial Consult  Pharmacy Consult for Heparin Indication: chest pain/ACS  Allergies  Allergen Reactions  . Aspirin Other (See Comments)    Makes stomach burn    Patient Measurements: Height: 5\' 4"  (162.6 cm) Weight: 156 lb 12.8 oz (71.124 kg) IBW/kg (Calculated) : 54.7 Heparin Dosing Weight: 70 kg  Vital Signs: Temp: 98.4 F (36.9 C) (07/17 1958) Temp Source: Oral (07/17 1958) BP: 157/74 mmHg (07/17 1958) Pulse Rate: 59 (07/17 1958)  Labs:  Recent Labs  07/14/16 1337 07/14/16 1931 07/14/16 2049  HGB 13.2  --   --   HCT 40.9  --   --   PLT 323  --   --   HEPARINUNFRC  --   --  0.68  CREATININE 1.11*  --   --   TROPONINI 0.09* 0.98*  --     Estimated Creatinine Clearance: 42.4 mL/min (by C-G formula based on Cr of 1.11).   Medical History: Past Medical History  Diagnosis Date  . Hypertension   . Glaucoma   . Hyperlipidemia   . Cataracts, both eyes   . Colon polyps     Hyperplastic 2003  . Internal hemorrhoid   . External hemorrhoid   . Personal history of colonic polyps 03/03/2012  . Anemia   . Heart murmur   . GERD (gastroesophageal reflux disease)   . Arthritis     "right hip" (09/21/2014)  . Anxiety   . Chest pain 07/01/2016  . Type II diabetes mellitus ([place])     type 2    Medications:  Prescriptions prior to admission  Medication Sig Dispense Refill Last Dose  . acetaminophen (TYLENOL) 325 MG tablet Take 650 mg by mouth every 6 (six) hours as needed for headache.   unkn  . amLODipine (NORVASC) 5 MG tablet Take 1 tablet (5 mg total) by mouth daily. 30 tablet 1 07/13/2016 at Unknown time  . aspirin EC 81 MG tablet Take 1 tablet (81 mg total) by mouth daily.   07/14/2016 at Unknown time  . atorvastatin (LIPITOR) 40 MG tablet Take 40 mg by mouth daily.   07/13/2016 at Unknown time  . benazepril (LOTENSIN) 20 MG tablet Take 20 mg by mouth daily.   07/13/2016 at Unknown time  . cholecalciferol (VITAMIN D) 1000 UNITS tablet  Take 1,000 Units by mouth every other day.    07/13/2016 at Unknown time  . clopidogrel (PLAVIX) 75 MG tablet Take 1 tablet (75 mg total) by mouth daily with breakfast. 30 tablet 3 07/13/2016 at Unknown time  . glipiZIDE (GLUCOTROL XL) 2.5 MG 24 hr tablet Take 2.5 mg by mouth daily.   07/13/2016 at Unknown time  . loperamide (IMODIUM) 2 MG capsule Take 4 mg by mouth as needed for diarrhea or loose stools.   Past Month at Unknown time  . loratadine (CLARITIN) 10 MG tablet Take 10 mg by mouth daily as needed for allergies.    unkn  . LUMIGAN 0.01 % SOLN Place 1 drop into both eyes at bedtime.    07/13/2016 at Unknown time  . metoprolol tartrate (LOPRESSOR) 25 MG tablet Take 25 mg by mouth daily.    07/13/2016 at 1000  . nitroGLYCERIN (NITROSTAT) 0.4 MG SL tablet Place 0.4 mg under the tongue every 5 (five) minutes as needed for chest pain.    07/14/2016 at Unknown time  . ondansetron (ZOFRAN) 4 MG tablet Take 1 tablet (4 mg total) by mouth every 8 (eight) hours as needed for nausea  or vomiting. 8 tablet 0 NEW RX  . pantoprazole (PROTONIX) 40 MG tablet Take 1 tablet (40 mg total) by mouth daily. 30 tablet 6 07/13/2016 at Unknown time    Assessment: 75 yo F admitted with CP 2 weeks ago and had stent at that time.  Pt returns to MD office with CP and instructed to return back to hospital. To be admitted for CP, r/o MI. Continues on heparin per pharmacy. HL therapeutic at 0.68 at current rate. CBC WNL. No bleeding reported.   Goal of Therapy:  Heparin level 0.3-0.7 units/ml Monitor platelets by anticoagulation protocol: Yes   Plan:  Continue heparin at 950 units/hr  Daily HL/CBC  Plan cath 7/18  Stephens November, PharmD Clinical Pharmacist  9:21 PM, 07/14/2016

## 2016-07-14 NOTE — Progress Notes (Signed)
ANTICOAGULATION CONSULT NOTE - Initial Consult  Pharmacy Consult for Heparin Indication: chest pain/ACS  Allergies  Allergen Reactions  . Aspirin Other (See Comments)    Makes stomach burn    Patient Measurements:   Heparin Dosing Weight: 70 kg  Vital Signs: Temp: 98.8 F (37.1 C) (07/17 1254) Temp Source: Oral (07/17 1254) BP: 177/100 mmHg (07/17 1315) Pulse Rate: 74 (07/17 1315)  Labs: No results for input(s): HGB, HCT, PLT, APTT, LABPROT, INR, HEPARINUNFRC, HEPRLOWMOCWT, CREATININE, CKTOTAL, CKMB, TROPONINI in the last 72 hours.  Estimated Creatinine Clearance: 49.1 mL/min (by C-G formula based on Cr of 0.98).   Medical History: Past Medical History  Diagnosis Date  . Hypertension   . Glaucoma   . Hyperlipidemia   . Cataracts, both eyes   . Colon polyps     Hyperplastic 2003  . Internal hemorrhoid   . External hemorrhoid   . Personal history of colonic polyps 03/03/2012  . Anemia   . Heart murmur   . GERD (gastroesophageal reflux disease)   . Arthritis     "right hip" (09/21/2014)  . Anxiety   . Chest pain 07/01/2016  . Type II diabetes mellitus ([place])     type 2    Medications:  Prescriptions prior to admission  Medication Sig Dispense Refill Last Dose  . acetaminophen (TYLENOL) 325 MG tablet Take 650 mg by mouth every 6 (six) hours as needed for headache.   unkn  . amLODipine (NORVASC) 5 MG tablet Take 1 tablet (5 mg total) by mouth daily. 30 tablet 1 07/13/2016 at Unknown time  . aspirin EC 81 MG tablet Take 1 tablet (81 mg total) by mouth daily.   07/14/2016 at Unknown time  . atorvastatin (LIPITOR) 40 MG tablet Take 40 mg by mouth daily.   07/13/2016 at Unknown time  . benazepril (LOTENSIN) 20 MG tablet Take 20 mg by mouth daily.   07/13/2016 at Unknown time  . cholecalciferol (VITAMIN D) 1000 UNITS tablet Take 1,000 Units by mouth every other day.    07/13/2016 at Unknown time  . clopidogrel (PLAVIX) 75 MG tablet Take 1 tablet (75 mg total) by mouth daily  with breakfast. 30 tablet 3 07/13/2016 at Unknown time  . glipiZIDE (GLUCOTROL XL) 2.5 MG 24 hr tablet Take 2.5 mg by mouth daily.   07/13/2016 at Unknown time  . loperamide (IMODIUM) 2 MG capsule Take 4 mg by mouth as needed for diarrhea or loose stools.   Past Month at Unknown time  . loratadine (CLARITIN) 10 MG tablet Take 10 mg by mouth daily as needed for allergies.    unkn  . LUMIGAN 0.01 % SOLN Place 1 drop into both eyes at bedtime.    07/13/2016 at Unknown time  . metoprolol tartrate (LOPRESSOR) 25 MG tablet Take 25 mg by mouth daily.    07/13/2016 at 1000  . nitroGLYCERIN (NITROSTAT) 0.4 MG SL tablet Place 0.4 mg under the tongue every 5 (five) minutes as needed for chest pain.    07/14/2016 at Unknown time  . ondansetron (ZOFRAN) 4 MG tablet Take 1 tablet (4 mg total) by mouth every 8 (eight) hours as needed for nausea or vomiting. 8 tablet 0 NEW RX  . pantoprazole (PROTONIX) 40 MG tablet Take 1 tablet (40 mg total) by mouth daily. 30 tablet 6 07/13/2016 at Unknown time    Assessment: 75 yo F admitted with CP 2 weeks ago and had stent at that time.  Pt returns to MD office with CP  and instructed to return back to hospital.  To be admitted for CP, r/o MI.  To start heparin.  Pt was previusly therapeutic on 850-950 units/hr.  Baseline labs ordered.  Goal of Therapy:  Heparin level 0.3-0.7 units/ml Monitor platelets by anticoagulation protocol: Yes   Plan:  Heparin 3000 units x 1  Heparin infusion at 950 units/hr  Heparin level in 6 hours  Heparin level and CBC daily while on heparin  Plan cath 7/18  [facility], Pharm.D., BCPS Clinical Pharmacist Pager [phone] 07/14/2016 2:10 PM

## 2016-07-15 ENCOUNTER — Encounter (HOSPITAL_COMMUNITY)
Admission: AD | Disposition: A | Discharge status: Home / Self Care | Payer: Self-pay | Source: Ambulatory Visit | Attending: Cardiovascular Disease

## 2016-07-15 ENCOUNTER — Encounter (HOSPITAL_COMMUNITY): Payer: Self-pay | Admitting: Cardiovascular Disease

## 2016-07-15 DIAGNOSIS — Z955 Presence of coronary angioplasty implant and graft: Secondary | ICD-10-CM | POA: Diagnosis not present

## 2016-07-15 DIAGNOSIS — E784 Other hyperlipidemia: Secondary | ICD-10-CM | POA: Diagnosis not present

## 2016-07-15 DIAGNOSIS — I214 Non-ST elevation (NSTEMI) myocardial infarction: Secondary | ICD-10-CM | POA: Diagnosis not present

## 2016-07-15 DIAGNOSIS — R079 Chest pain, unspecified: Secondary | ICD-10-CM | POA: Diagnosis not present

## 2016-07-15 DIAGNOSIS — Z8249 Family history of ischemic heart disease and other diseases of the circulatory system: Secondary | ICD-10-CM | POA: Diagnosis not present

## 2016-07-15 DIAGNOSIS — K219 Gastro-esophageal reflux disease without esophagitis: Secondary | ICD-10-CM | POA: Diagnosis not present

## 2016-07-15 DIAGNOSIS — I25110 Atherosclerotic heart disease of native coronary artery with unstable angina pectoris: Secondary | ICD-10-CM | POA: Diagnosis not present

## 2016-07-15 DIAGNOSIS — Z7982 Long term (current) use of aspirin: Secondary | ICD-10-CM | POA: Diagnosis not present

## 2016-07-15 DIAGNOSIS — Z79899 Other long term (current) drug therapy: Secondary | ICD-10-CM | POA: Diagnosis not present

## 2016-07-15 DIAGNOSIS — Z7984 Long term (current) use of oral hypoglycemic drugs: Secondary | ICD-10-CM | POA: Diagnosis not present

## 2016-07-15 DIAGNOSIS — E119 Type 2 diabetes mellitus without complications: Secondary | ICD-10-CM | POA: Diagnosis not present

## 2016-07-15 DIAGNOSIS — Z886 Allergy status to analgesic agent status: Secondary | ICD-10-CM | POA: Diagnosis not present

## 2016-07-15 DIAGNOSIS — Z7902 Long term (current) use of antithrombotics/antiplatelets: Secondary | ICD-10-CM | POA: Diagnosis not present

## 2016-07-15 DIAGNOSIS — F419 Anxiety disorder, unspecified: Secondary | ICD-10-CM | POA: Diagnosis not present

## 2016-07-15 DIAGNOSIS — I2510 Atherosclerotic heart disease of native coronary artery without angina pectoris: Secondary | ICD-10-CM | POA: Diagnosis present

## 2016-07-15 DIAGNOSIS — Z833 Family history of diabetes mellitus: Secondary | ICD-10-CM | POA: Diagnosis not present

## 2016-07-15 DIAGNOSIS — H409 Unspecified glaucoma: Secondary | ICD-10-CM | POA: Diagnosis not present

## 2016-07-15 DIAGNOSIS — Z823 Family history of stroke: Secondary | ICD-10-CM | POA: Diagnosis not present

## 2016-07-15 DIAGNOSIS — Z9861 Coronary angioplasty status: Secondary | ICD-10-CM | POA: Diagnosis not present

## 2016-07-15 DIAGNOSIS — E785 Hyperlipidemia, unspecified: Secondary | ICD-10-CM | POA: Diagnosis not present

## 2016-07-15 DIAGNOSIS — I10 Essential (primary) hypertension: Secondary | ICD-10-CM | POA: Diagnosis not present

## 2016-07-15 HISTORY — PX: CARDIAC CATHETERIZATION: SHX172

## 2016-07-15 LAB — CBC
HCT: 42.7 % (ref 36.0–46.0)
HCT: 39.8 % (ref 36.0–46.0)
Hemoglobin: 14.1 g/dL (ref 12.0–15.0)
Hemoglobin: 13.4 g/dL (ref 12.0–15.0)
MCH: 27 pg (ref 26.0–34.0)
MCH: 27.2 pg (ref 26.0–34.0)
MCHC: 33 g/dL (ref 30.0–36.0)
MCHC: 33.7 g/dL (ref 30.0–36.0)
MCV: 81.6 fL (ref 78.0–100.0)
MCV: 80.9 fL (ref 78.0–100.0)
Platelets: 334 10*3/uL (ref 150–400)
Platelets: 304 10*3/uL (ref 150–400)
RBC: 5.23 MIL/uL — ABNORMAL HIGH (ref 3.87–5.11)
RBC: 4.92 MIL/uL (ref 3.87–5.11)
RDW: 13.6 % (ref 11.5–15.5)
RDW: 13.8 % (ref 11.5–15.5)
WBC: 11.1 10*3/uL — ABNORMAL HIGH (ref 4.0–10.5)
WBC: 9 10*3/uL (ref 4.0–10.5)

## 2016-07-15 LAB — HEPARIN LEVEL (UNFRACTIONATED)
Heparin Unfractionated: 0.66 IU/mL (ref 0.30–0.70)
Heparin Unfractionated: 0.54 IU/mL (ref 0.30–0.70)

## 2016-07-15 LAB — BASIC METABOLIC PANEL
Anion gap: 9 (ref 5–15)
BUN: 7 mg/dL (ref 6–20)
CO2: 27 mmol/L (ref 22–32)
Calcium: 9.8 mg/dL (ref 8.9–10.3)
Chloride: 103 mmol/L (ref 101–111)
Creatinine, Ser: 1.02 mg/dL — ABNORMAL HIGH (ref 0.44–1.00)
GFR calc Af Amer: >60 mL/min (ref >60)
GFR calc non Af Amer: 52 mL/min — ABNORMAL LOW (ref >60)
Glucose, Bld: 95 mg/dL (ref 65–99)
Potassium: 3.2 mmol/L — ABNORMAL LOW (ref 3.5–5.1)
Sodium: 139 mmol/L (ref 135–145)

## 2016-07-15 LAB — LIPID PANEL
Cholesterol: 108 mg/dL (ref 0–200)
HDL: 41 mg/dL (ref >40)
LDL Cholesterol: 47 mg/dL (ref 0–99)
Total CHOL/HDL Ratio: 2.6 RATIO
Triglycerides: 101 mg/dL (ref <150)
VLDL: 20 mg/dL (ref 0–40)

## 2016-07-15 LAB — PROTIME-INR
INR: 1.1 (ref 0.00–1.49)
Prothrombin Time: 14.4 seconds (ref 11.6–15.2)

## 2016-07-15 LAB — GLUCOSE, CAPILLARY
Glucose-Capillary: 105 mg/dL — ABNORMAL HIGH (ref 65–99)
Glucose-Capillary: 132 mg/dL — ABNORMAL HIGH (ref 65–99)
Glucose-Capillary: 134 mg/dL — ABNORMAL HIGH (ref 65–99)
Glucose-Capillary: 105 mg/dL — ABNORMAL HIGH (ref 65–99)

## 2016-07-15 LAB — PLATELET INHIBITION P2Y12
Platelet Function  P2Y12: 205 [PRU] (ref 194–418)
Platelet Function  P2Y12: 201 [PRU] (ref 194–418)

## 2016-07-15 LAB — POCT ACTIVATED CLOTTING TIME: Activated Clotting Time: 169 seconds

## 2016-07-15 LAB — TROPONIN I: Troponin I: 1.46 ng/mL (ref <0.03)

## 2016-07-15 SURGERY — LEFT HEART CATH AND CORONARY ANGIOGRAPHY

## 2016-07-15 MED ORDER — SODIUM CHLORIDE 0.9 % IV SOLN: 250.0000 mL | INTRAVENOUS | Status: DC | PRN

## 2016-07-15 MED ORDER — HEPARIN (PORCINE) IN NACL 100-0.45 UNIT/ML-% IJ SOLN
900.0000 [IU]/h | INTRAMUSCULAR | Status: DC
  Administered 2016-07-15 – 2016-07-17 (×2): 900 [IU]/h via INTRAVENOUS
  Filled 2016-07-15 (×2): qty 250

## 2016-07-15 MED ORDER — FENTANYL CITRATE (PF) 100 MCG/2ML IJ SOLN
INTRAMUSCULAR | Status: AC
  Filled 2016-07-15: qty 2

## 2016-07-15 MED ORDER — SODIUM CHLORIDE 0.9% FLUSH: 3.0000 mL | INTRAVENOUS | Status: DC | PRN

## 2016-07-15 MED ORDER — MIDAZOLAM HCL 2 MG/2ML IJ SOLN
INTRAMUSCULAR | Status: AC
  Filled 2016-07-15: qty 2

## 2016-07-15 MED ORDER — GLIPIZIDE ER 2.5 MG PO TB24
2.5000 mg | ORAL_TABLET | Freq: Every day | ORAL | Status: DC
  Administered 2016-07-15 – 2016-07-17 (×3): 2.5 mg via ORAL
  Filled 2016-07-15 (×3): qty 1

## 2016-07-15 MED ORDER — IOPAMIDOL (ISOVUE-370) INJECTION 76%
INTRAVENOUS | Status: DC | PRN
  Administered 2016-07-15: 45 mL via INTRA_ARTERIAL

## 2016-07-15 MED ORDER — HEPARIN (PORCINE) IN NACL 2-0.9 UNIT/ML-% IJ SOLN
INTRAMUSCULAR | Status: DC | PRN
  Administered 2016-07-15: 1000 mL

## 2016-07-15 MED ORDER — LIDOCAINE HCL (PF) 1 % IJ SOLN
INTRAMUSCULAR | Status: AC
  Filled 2016-07-15: qty 30

## 2016-07-15 MED ORDER — FENTANYL CITRATE (PF) 100 MCG/2ML IJ SOLN
INTRAMUSCULAR | Status: DC | PRN
  Administered 2016-07-15: 25 ug via INTRAVENOUS

## 2016-07-15 MED ORDER — SODIUM CHLORIDE 0.9 % IV SOLN
INTRAVENOUS | Status: AC
  Administered 2016-07-15: 11:00:00 via INTRAVENOUS

## 2016-07-15 MED ORDER — IOPAMIDOL (ISOVUE-370) INJECTION 76%
INTRAVENOUS | Status: AC
  Filled 2016-07-15: qty 100

## 2016-07-15 MED ORDER — MIDAZOLAM HCL 2 MG/2ML IJ SOLN
INTRAMUSCULAR | Status: DC | PRN
  Administered 2016-07-15: 1 mg via INTRAVENOUS

## 2016-07-15 MED ORDER — LIDOCAINE HCL (PF) 1 % IJ SOLN
INTRAMUSCULAR | Status: DC | PRN
  Administered 2016-07-15: 20 mL

## 2016-07-15 MED ORDER — SODIUM CHLORIDE 0.9% FLUSH
3.0000 mL | Freq: Two times a day (BID) | INTRAVENOUS | Status: DC
  Administered 2016-07-15 – 2016-07-16 (×2): 3 mL via INTRAVENOUS

## 2016-07-15 SURGICAL SUPPLY — 6 items
CATH INFINITI 5FR MULTPACK ANG (CATHETERS) ×1 IMPLANT
KIT HEART LEFT (KITS) ×2 IMPLANT
PACK CARDIAC CATHETERIZATION (CUSTOM PROCEDURE TRAY) ×2 IMPLANT
SHEATH PINNACLE 5F 10CM (SHEATH) ×1 IMPLANT
TRANSDUCER W/STOPCOCK (MISCELLANEOUS) ×2 IMPLANT
WIRE EMERALD 3MM-J .035X150CM (WIRE) ×1 IMPLANT

## 2016-07-15 NOTE — Progress Notes (Signed)
Site area: rt groin Site Prior to Removal:  Level 0 Pressure Applied For:  20 minutes Manual:   yes Patient Status During Pull:  stable Post Pull Site:  Level  0 Post Pull Instructions Given:  yes Post Pull Pulses Present: yes Dressing Applied:  tegaderm Bedrest begins @  [id number] Comments:

## 2016-07-15 NOTE — Care Management Obs Status (Signed)
[place] NOTIFICATION   Patient Details  Name: Heidi Wolf MRN: [id number] Date of Birth: 10-Sep-1941   Medicare Observation Status Notification Given:  Yes    Bethena Roys, RN 07/15/2016, 11:13 AM

## 2016-07-15 NOTE — Progress Notes (Signed)
ANTICOAGULATION CONSULT NOTE - Initial Consult  Pharmacy Consult for Heparin Indication: chest pain/ACS  Allergies  Allergen Reactions  . Aspirin Other (See Comments)    Makes stomach burn    Patient Measurements: Height: 5\' 4"  (162.6 cm) Weight: 153 lb 4.8 oz (69.536 kg) IBW/kg (Calculated) : 54.7 Heparin Dosing Weight: 70 kg  Vital Signs: Temp: 98.8 F (37.1 C) (07/18 0219) Temp Source: Oral (07/18 0219) BP: 142/82 mmHg (07/18 0925) Pulse Rate: 65 (07/18 0925)  Labs:  Recent Labs  07/14/16 1337 07/14/16 1931 07/14/16 2049 07/15/16 0111 07/15/16 0825  HGB 13.2  --   --  14.1  --   HCT 40.9  --   --  42.7  --   PLT 323  --   --  334  --   LABPROT  --   --   --  14.4  --   INR  --   --   --  1.10  --   HEPARINUNFRC  --   --  0.68 0.66 0.54  CREATININE 1.11*  --   --  1.02*  --   TROPONINI 0.09* 0.98*  --  1.46*  --     Estimated Creatinine Clearance: 45.6 mL/min (by C-G formula based on Cr of 1.02).   Medical History: Past Medical History  Diagnosis Date  . Hypertension   . Glaucoma   . Hyperlipidemia   . Cataracts, both eyes   . Colon polyps     Hyperplastic 2003  . Internal hemorrhoid   . External hemorrhoid   . Personal history of colonic polyps 03/03/2012  . Anemia   . Heart murmur   . GERD (gastroesophageal reflux disease)   . Arthritis     "right hip" (09/21/2014)  . Anxiety   . Chest pain 07/01/2016  . Type II diabetes mellitus ([place])     type 2    Medications:  Prescriptions prior to admission  Medication Sig Dispense Refill Last Dose  . acetaminophen (TYLENOL) 325 MG tablet Take 650 mg by mouth every 6 (six) hours as needed for headache.   unkn  . amLODipine (NORVASC) 5 MG tablet Take 1 tablet (5 mg total) by mouth daily. 30 tablet 1 07/13/2016 at Unknown time  . aspirin EC 81 MG tablet Take 1 tablet (81 mg total) by mouth daily.   07/14/2016 at Unknown time  . atorvastatin (LIPITOR) 40 MG tablet Take 40 mg by mouth daily.   07/13/2016 at  Unknown time  . benazepril (LOTENSIN) 20 MG tablet Take 20 mg by mouth daily.   07/13/2016 at Unknown time  . cholecalciferol (VITAMIN D) 1000 UNITS tablet Take 1,000 Units by mouth every other day.    07/13/2016 at Unknown time  . clopidogrel (PLAVIX) 75 MG tablet Take 1 tablet (75 mg total) by mouth daily with breakfast. 30 tablet 3 07/13/2016 at Unknown time  . glipiZIDE (GLUCOTROL XL) 2.5 MG 24 hr tablet Take 2.5 mg by mouth daily.   07/13/2016 at Unknown time  . loperamide (IMODIUM) 2 MG capsule Take 4 mg by mouth as needed for diarrhea or loose stools.   Past Month at Unknown time  . loratadine (CLARITIN) 10 MG tablet Take 10 mg by mouth daily as needed for allergies.    unkn  . LUMIGAN 0.01 % SOLN Place 1 drop into both eyes at bedtime.    07/13/2016 at Unknown time  . metoprolol tartrate (LOPRESSOR) 25 MG tablet Take 25 mg by mouth daily.  07/13/2016 at 1000  . nitroGLYCERIN (NITROSTAT) 0.4 MG SL tablet Place 0.4 mg under the tongue every 5 (five) minutes as needed for chest pain.    07/14/2016 at Unknown time  . ondansetron (ZOFRAN) 4 MG tablet Take 1 tablet (4 mg total) by mouth every 8 (eight) hours as needed for nausea or vomiting. 8 tablet 0 NEW RX  . pantoprazole (PROTONIX) 40 MG tablet Take 1 tablet (40 mg total) by mouth daily. 30 tablet 6 07/13/2016 at Unknown time    Assessment: 75 yo F admitted with CP 2 weeks ago s/p stent continues on heparin for ACS. S/P cath 7/18 with heparin to resume 8hr post-sheath removal per cards. Previously therapeutic on 850-950 units/hr. Sheath removed today ~0900. CBC WNL. No bleeding noted.    Goal of Therapy:  Heparin level 0.3-0.7 units/ml Monitor platelets by anticoagulation protocol: Yes   Plan:  -Restart heparin 8 hr post-sheath removal at 900 units/hr (to start at 1700) -Heparin level in 6 hours -Daily HL/CBC -Monitor s/sx bleeding  Stephens November, PharmD Clinical Pharmacist  10:02 AM, 07/15/2016

## 2016-07-15 NOTE — Interval H&P Note (Signed)
History and Physical Interval Note:  07/15/2016 7:52 AM  Heidi Wolf  has presented today for surgery, with the diagnosis of cp  The various methods of treatment have been discussed with the patient and family. After consideration of risks, benefits and other options for treatment, the patient has consented to  Procedure(s): Left Heart Cath and Coronary Angiography (N/A) as a surgical intervention .  The patient's history has been reviewed, patient examined, no change in status, stable for surgery.  I have reviewed the patient's chart and labs.  Questions were answered to the patient's satisfaction.     Ziair Penson S

## 2016-07-15 NOTE — Plan of Care (Signed)
Problem: Consults Goal: Cardiac Cath Patient Education (See Patient Education module for education specifics.) Outcome: Progressing Patient pre-cath education initiated. Patient is not comfortable signing consent at this time.  Noted for MD follow-up in AM. Will report to oncoming shift.

## 2016-07-16 LAB — BASIC METABOLIC PANEL
Anion gap: 8 (ref 5–15)
BUN: 7 mg/dL (ref 6–20)
CO2: 26 mmol/L (ref 22–32)
Calcium: 9.4 mg/dL (ref 8.9–10.3)
Chloride: 104 mmol/L (ref 101–111)
Creatinine, Ser: 1.13 mg/dL — ABNORMAL HIGH (ref 0.44–1.00)
GFR calc Af Amer: 54 mL/min — ABNORMAL LOW (ref >60)
GFR calc non Af Amer: 46 mL/min — ABNORMAL LOW (ref >60)
Glucose, Bld: 88 mg/dL (ref 65–99)
Potassium: 3.3 mmol/L — ABNORMAL LOW (ref 3.5–5.1)
Sodium: 138 mmol/L (ref 135–145)

## 2016-07-16 LAB — HEPARIN LEVEL (UNFRACTIONATED)
Heparin Unfractionated: 0.46 IU/mL (ref 0.30–0.70)
Heparin Unfractionated: 0.47 IU/mL (ref 0.30–0.70)

## 2016-07-16 LAB — CBC
HCT: 38 % (ref 36.0–46.0)
Hemoglobin: 12.3 g/dL (ref 12.0–15.0)
MCH: 26.4 pg (ref 26.0–34.0)
MCHC: 32.4 g/dL (ref 30.0–36.0)
MCV: 81.5 fL (ref 78.0–100.0)
Platelets: 282 10*3/uL (ref 150–400)
RBC: 4.66 MIL/uL (ref 3.87–5.11)
RDW: 14.2 % (ref 11.5–15.5)
WBC: 9.3 10*3/uL (ref 4.0–10.5)

## 2016-07-16 LAB — GLUCOSE, CAPILLARY
Glucose-Capillary: 95 mg/dL (ref 65–99)
Glucose-Capillary: 102 mg/dL — ABNORMAL HIGH (ref 65–99)
Glucose-Capillary: 95 mg/dL (ref 65–99)

## 2016-07-16 MED ORDER — TICAGRELOR 90 MG PO TABS
90.0000 mg | ORAL_TABLET | Freq: Two times a day (BID) | ORAL | Status: DC
  Administered 2016-07-16 – 2016-07-17 (×2): 90 mg via ORAL
  Filled 2016-07-16 (×2): qty 1

## 2016-07-16 MED ORDER — POTASSIUM CHLORIDE ER 10 MEQ PO TBCR
20.0000 meq | EXTENDED_RELEASE_TABLET | Freq: Two times a day (BID) | ORAL | Status: DC
  Administered 2016-07-16 – 2016-07-17 (×3): 20 meq via ORAL
  Filled 2016-07-16 (×5): qty 2

## 2016-07-16 MED ORDER — TICAGRELOR 90 MG PO TABS
180.0000 mg | ORAL_TABLET | Freq: Once | ORAL | Status: AC
  Administered 2016-07-16: 180 mg via ORAL
  Filled 2016-07-16: qty 2

## 2016-07-16 NOTE — Progress Notes (Signed)
ANTICOAGULATION CONSULT NOTE - Follow Up Consult  Pharmacy Consult for Heparin  Indication: chest pain/ACS, s/p cath  Allergies  Allergen Reactions  . Aspirin Other (See Comments)    Makes stomach burn    Patient Measurements: Height: 5\' 4"  (162.6 cm) Weight: 153 lb 4.8 oz (69.536 kg) IBW/kg (Calculated) : 54.7  Vital Signs: Temp: 98.1 F (36.7 C) (07/18 2105) Temp Source: Oral (07/18 2105) BP: 113/63 mmHg (07/18 2105) Pulse Rate: 51 (07/18 2105)  Labs:  Recent Labs  07/14/16 1337 07/14/16 1931  07/15/16 0111 07/15/16 0825 07/15/16 1322 07/16/16 0038  HGB 13.2  --   --  14.1  --  13.4  --   HCT 40.9  --   --  42.7  --  39.8  --   PLT 323  --   --  334  --  304  --   LABPROT  --   --   --  14.4  --   --   --   INR  --   --   --  1.10  --   --   --   HEPARINUNFRC  --   --   < > 0.66 0.54  --  0.47  CREATININE 1.11*  --   --  1.02*  --   --   --   TROPONINI 0.09* 0.98*  --  1.46*  --   --   --   < > = values in this interval not displayed.  Estimated Creatinine Clearance: 45.6 mL/min (by C-G formula based on Cr of 1.02).    Assessment: Therapeutic heparin level x 1 after re-start s/p cath  Goal of Therapy:  Heparin level 0.3-0.7 units/ml Monitor platelets by anticoagulation protocol: Yes   Plan:  -Cont heparin at 900 units/hr -1000 HL  Anh Mangano 07/16/2016,1:18 AM

## 2016-07-16 NOTE — Progress Notes (Signed)
ANTICOAGULATION CONSULT NOTE - Initial Consult  Pharmacy Consult for Heparin Indication: chest pain/ACS  Allergies  Allergen Reactions  . Aspirin Other (See Comments)    Makes stomach burn    Patient Measurements: Height: 5\' 4"  (162.6 cm) Weight: 156 lb 8 oz (70.988 kg) IBW/kg (Calculated) : 54.7 Heparin Dosing Weight: 70 kg  Vital Signs: Temp: 98.4 F (36.9 C) (07/19 0556) Temp Source: Oral (07/19 0556) BP: 140/79 mmHg (07/19 0846) Pulse Rate: 75 (07/19 0846)  Labs:  Recent Labs  07/14/16 1337 07/14/16 1931  07/15/16 0111 07/15/16 0825 07/15/16 1322 07/16/16 0038 07/16/16 0501 07/16/16 1028  HGB 13.2  --   --  14.1  --  13.4  --  12.3  --   HCT 40.9  --   --  42.7  --  39.8  --  38.0  --   PLT 323  --   --  334  --  304  --  282  --   LABPROT  --   --   --  14.4  --   --   --   --   --   INR  --   --   --  1.10  --   --   --   --   --   HEPARINUNFRC  --   --   < > 0.66 0.54  --  0.47  --  0.46  CREATININE 1.11*  --   --  1.02*  --   --   --  1.13*  --   TROPONINI 0.09* 0.98*  --  1.46*  --   --   --   --   --   < > = values in this interval not displayed.  Estimated Creatinine Clearance: 41.6 mL/min (by C-G formula based on Cr of 1.13).   Medical History: Past Medical History  Diagnosis Date  . Hypertension   . Glaucoma   . Hyperlipidemia   . Cataracts, both eyes   . Colon polyps     Hyperplastic 2003  . Internal hemorrhoid   . External hemorrhoid   . Personal history of colonic polyps 03/03/2012  . Anemia   . Heart murmur   . GERD (gastroesophageal reflux disease)   . Arthritis     "right hip" (09/21/2014)  . Anxiety   . Chest pain 07/01/2016  . Type II diabetes mellitus ([place])     type 2    Medications:  Prescriptions prior to admission  Medication Sig Dispense Refill Last Dose  . acetaminophen (TYLENOL) 325 MG tablet Take 650 mg by mouth every 6 (six) hours as needed for headache.   unkn  . amLODipine (NORVASC) 5 MG tablet Take 1 tablet (5 mg  total) by mouth daily. 30 tablet 1 07/13/2016 at Unknown time  . aspirin EC 81 MG tablet Take 1 tablet (81 mg total) by mouth daily.   07/14/2016 at Unknown time  . atorvastatin (LIPITOR) 40 MG tablet Take 40 mg by mouth daily.   07/13/2016 at Unknown time  . benazepril (LOTENSIN) 20 MG tablet Take 20 mg by mouth daily.   07/13/2016 at Unknown time  . cholecalciferol (VITAMIN D) 1000 UNITS tablet Take 1,000 Units by mouth every other day.    07/13/2016 at Unknown time  . clopidogrel (PLAVIX) 75 MG tablet Take 1 tablet (75 mg total) by mouth daily with breakfast. 30 tablet 3 07/13/2016 at Unknown time  . glipiZIDE (GLUCOTROL XL) 2.5 MG 24 hr tablet Take 2.5  mg by mouth daily.   07/13/2016 at Unknown time  . loperamide (IMODIUM) 2 MG capsule Take 4 mg by mouth as needed for diarrhea or loose stools.   Past Month at Unknown time  . loratadine (CLARITIN) 10 MG tablet Take 10 mg by mouth daily as needed for allergies.    unkn  . LUMIGAN 0.01 % SOLN Place 1 drop into both eyes at bedtime.    07/13/2016 at Unknown time  . metoprolol tartrate (LOPRESSOR) 25 MG tablet Take 25 mg by mouth daily.    07/13/2016 at 1000  . nitroGLYCERIN (NITROSTAT) 0.4 MG SL tablet Place 0.4 mg under the tongue every 5 (five) minutes as needed for chest pain.    07/14/2016 at Unknown time  . ondansetron (ZOFRAN) 4 MG tablet Take 1 tablet (4 mg total) by mouth every 8 (eight) hours as needed for nausea or vomiting. 8 tablet 0 NEW RX  . pantoprazole (PROTONIX) 40 MG tablet Take 1 tablet (40 mg total) by mouth daily. 30 tablet 6 07/13/2016 at Unknown time    Assessment: 75 yo F continues on heparin s/p cath on 7/18. HL remains therapeutic at 0.46 on current rate. Slight drop in Hgb - 13.4>12.3. Plt 282. No bleeding noted.   Goal of Therapy:  Heparin level 0.3-0.7 units/ml Monitor platelets by anticoagulation protocol: Yes   Plan:  -Cont heparin at 900 units/hr -Daily HL/CBC -Monitor s/sx bleeding  Stephens November, PharmD Clinical  Pharmacist  11:16 AM, 07/16/2016

## 2016-07-16 NOTE — Progress Notes (Signed)
Ref: Birdie Riddle, MD   Subjective:  Feeling better. VS stable. Non-critical 2nd. diagonal osteal lesion. Switch to Brilinta as patient intolerant to full dose aspirin.  Objective:  Vital Signs in the last 24 hours: Temp:  [98.1 F (36.7 C)-99 F (37.2 C)] 98.4 F (36.9 C) (07/19 1936) Pulse Rate:  [51-75] 66 (07/19 1936) Cardiac Rhythm:  [-] Normal sinus rhythm (07/19 2004) Resp:  [13-18] 15 (07/19 1936) BP: (113-151)/(63-79) 148/79 mmHg (07/19 1936) SpO2:  [97 %-100 %] 100 % (07/19 1936) Weight:  [70.988 kg (156 lb 8 oz)] 70.988 kg (156 lb 8 oz) (07/19 0556)  Physical Exam: BP Readings from Last 1 Encounters:  07/16/16 148/79    Wt Readings from Last 1 Encounters:  07/16/16 70.988 kg (156 lb 8 oz)    Weight change: -0.136 kg (-4.8 oz)  HEENT: [place]/AT, Eyes-Brown, PERL, EOMI, Conjunctiva-Pink, Sclera-Non-icteric Neck: No JVD, No bruit, Trachea midline. Lungs:  Clear, Bilateral. Cardiac:  Regular rhythm, normal S1 and S2, no S3. III/VI systolic murmur. Abdomen:  Soft, non-tender. Extremities:  No edema present. No cyanosis. No clubbing. Small bruise right groin cath site. CNS: AxOx3, Cranial nerves grossly intact, moves all 4 extremities. Right handed. Skin: Warm and dry.   Intake/Output from previous day: 07/18 0701 - 07/19 0700 In: 355 [P.O.:355] Out: 1475 [Urine:1475]    Lab Results: BMET    Component Value Date/Time   NA 138 07/16/2016 0501   NA 139 07/15/2016 0111   NA 139 07/14/2016 1337   K 3.3* 07/16/2016 0501   K 3.2* 07/15/2016 0111   K 3.3* 07/14/2016 1337   CL 104 07/16/2016 0501   CL 103 07/15/2016 0111   CL 104 07/14/2016 1337   CO2 26 07/16/2016 0501   CO2 27 07/15/2016 0111   CO2 27 07/14/2016 1337   GLUCOSE 88 07/16/2016 0501   GLUCOSE 95 07/15/2016 0111   GLUCOSE 106* 07/14/2016 1337   BUN 7 07/16/2016 0501   BUN 7 07/15/2016 0111   BUN 10 07/14/2016 1337   CREATININE 1.13* 07/16/2016 0501   CREATININE 1.02* 07/15/2016 0111   CREATININE 1.11* 07/14/2016 1337   CALCIUM 9.4 07/16/2016 0501   CALCIUM 9.8 07/15/2016 0111   CALCIUM 9.4 07/14/2016 1337   GFRNONAA 46* 07/16/2016 0501   GFRNONAA 52* 07/15/2016 0111   GFRNONAA 47* 07/14/2016 1337   GFRAA 54* 07/16/2016 0501   GFRAA >60 07/15/2016 0111   GFRAA 55* 07/14/2016 1337   CBC    Component Value Date/Time   WBC 9.3 07/16/2016 0501   RBC 4.66 07/16/2016 0501   HGB 12.3 07/16/2016 0501   HCT 38.0 07/16/2016 0501   PLT 282 07/16/2016 0501   MCV 81.5 07/16/2016 0501   MCH 26.4 07/16/2016 0501   MCHC 32.4 07/16/2016 0501   RDW 14.2 07/16/2016 0501   LYMPHSABS 1.7 07/14/2016 1337   MONOABS 0.5 07/14/2016 1337   EOSABS 0.0 07/14/2016 1337   BASOSABS 0.0 07/14/2016 1337   HEPATIC Function Panel  Recent Labs  07/02/16 1331 07/09/16 0731 07/14/16 1337  PROT 7.5 7.2 7.7   HEMOGLOBIN A1C No components found for: HGA1C,  MPG CARDIAC ENZYMES Lab Results  Component Value Date   CKTOTAL 205 07/04/2016   CKMB 9.4* 07/04/2016   TROPONINI 1.46* 07/15/2016   TROPONINI 0.98* 07/14/2016   TROPONINI 0.09* 07/14/2016   BNP No results for input(s): PROBNP in the last 8760 hours. TSH No results for input(s): TSH in the last 8760 hours. CHOLESTEROL  Recent Labs  07/03/16  [id number] 07/15/16 0111  CHOL 209* 108    Scheduled Meds: . amLODipine  5 mg Oral Daily  . aspirin EC  81 mg Oral Daily  . atorvastatin  40 mg Oral Daily  . benazepril  20 mg Oral Daily  . cholecalciferol  1,000 Units Oral QODAY  . glipiZIDE  2.5 mg Oral Q breakfast  . latanoprost  1 drop Both Eyes QHS  . metoprolol tartrate  25 mg Oral Daily  . pantoprazole  40 mg Oral Daily  . potassium chloride  20 mEq Oral BID  . sodium chloride flush  3 mL Intravenous Q12H   Continuous Infusions: . heparin 900 Units/hr (07/16/16 1800)   PRN Meds:.sodium chloride, acetaminophen, loratadine, nitroGLYCERIN, ondansetron (ZOFRAN) IV, ondansetron, sodium chloride flush  Assessment/Plan: Acute  NSTEMI CAD with stent DM, II Hypertension  Hyperlipidemia  Cervical disc disease   Continue heparin overnight. Brilinta daily. Recheck Troponin-I in AM. Increase activity.   LOS: 1 day    Dixie Dials  MD  07/16/2016, 8:53 PM

## 2016-07-17 LAB — GLUCOSE, CAPILLARY
Glucose-Capillary: 77 mg/dL (ref 65–99)
Glucose-Capillary: 100 mg/dL — ABNORMAL HIGH (ref 65–99)
Glucose-Capillary: 123 mg/dL — ABNORMAL HIGH (ref 65–99)

## 2016-07-17 LAB — CBC
HCT: 39.6 % (ref 36.0–46.0)
Hemoglobin: 12.9 g/dL (ref 12.0–15.0)
MCH: 26.4 pg (ref 26.0–34.0)
MCHC: 32.6 g/dL (ref 30.0–36.0)
MCV: 81.1 fL (ref 78.0–100.0)
Platelets: 269 10*3/uL (ref 150–400)
RBC: 4.88 MIL/uL (ref 3.87–5.11)
RDW: 13.7 % (ref 11.5–15.5)
WBC: 8.5 10*3/uL (ref 4.0–10.5)

## 2016-07-17 LAB — BASIC METABOLIC PANEL
Anion gap: 5 (ref 5–15)
BUN: 6 mg/dL (ref 6–20)
CO2: 26 mmol/L (ref 22–32)
Calcium: 9.4 mg/dL (ref 8.9–10.3)
Chloride: 108 mmol/L (ref 101–111)
Creatinine, Ser: 1.02 mg/dL — ABNORMAL HIGH (ref 0.44–1.00)
GFR calc Af Amer: >60 mL/min (ref >60)
GFR calc non Af Amer: 52 mL/min — ABNORMAL LOW (ref >60)
Glucose, Bld: 93 mg/dL (ref 65–99)
Potassium: 3.5 mmol/L (ref 3.5–5.1)
Sodium: 139 mmol/L (ref 135–145)

## 2016-07-17 LAB — HEPARIN LEVEL (UNFRACTIONATED): Heparin Unfractionated: 0.5 IU/mL (ref 0.30–0.70)

## 2016-07-17 LAB — TROPONIN I: Troponin I: 0.96 ng/mL (ref <0.03)

## 2016-07-17 MED ORDER — TICAGRELOR 90 MG PO TABS: 90.0000 mg | ORAL_TABLET | Freq: Two times a day (BID) | ORAL | Status: DC

## 2016-07-17 MED ORDER — POTASSIUM CHLORIDE ER 20 MEQ PO TBCR: 10.0000 meq | EXTENDED_RELEASE_TABLET | Freq: Every day | ORAL | Status: DC

## 2016-07-17 NOTE — Discharge Summary (Signed)
Physician Discharge Summary  Patient ID: Heidi Wolf MRN: [id number] DOB/AGE: [birth year] 75 y.o.  Admit date: 07/14/2016 Discharge date: 07/17/2016  Admission Diagnoses: Chest pain CAD with stent DM, II Hypertension  Hyperlipidemia  Cervical disc disease   Discharge Diagnoses:  Principal Problem: * Acute NSTEMI * CAD with stent DM, II Hypertension  Hyperlipidemia  Cervical disc disease   Discharged Condition: fair  Hospital Course: 75 year old female with Diagonal branch of LAD stent-2weeks ago, diabetes mellitus, type II, hypertension, cervical disc disease and hypercholesterolemia had 1 week history of recurrent chest pain, tightness, across chest starting from right side to left side of chest, responding to NTG intake. No fever or cough. Some heart burn but using Protonix. Her cardiac catheterization was without critical restenosis of diagonal 2's osteal to proximal stent. Her cardiac enzymes were mildly elevated. Since her aspirin dose could not be increased she had Brilinta 90 mg. bid replacing Plavix was given. She ambulated well and discharged home with follow up by me in 1 week.  Consults: cardiology  Significant Diagnostic Studies: labs: CBC near normal, BMET. Troponin I peaked at 1.46. LDL cholesterol down to 47 from 139 mg/dL with normal lipid panel.  EKG-Normal Sinus rhythm with LVH and repolarization changes.  Treatments: cardiac meds: benazepril (Lotensin), metoprolol, amlodipine, aspirin and Ticagrelor. Adequate platelet inhibition by P2y12 test.  Discharge Exam: Blood pressure 146/78, pulse 77, temperature 98.2 F (36.8 C), temperature source Oral, resp. rate 12, height 5\' 4"  (1.626 m), weight 69.99 kg (154 lb 4.8 oz), SpO2 100 %.  GENERAL: The patient is short and averagely nourished black female in mild distress.  HEENT: The patient is normocephalic, atraumatic. She wears glasses. Has brown eyes. Conjunctivae pink. Sclera-non-icteric.  NECK: No JVD.  Positive faint bruit.  LUNGS: Clear bilaterally.  HEART: Normal S1 and S2. III/VI systolic murmur.  ABDOMEN: Soft and nontender.  EXTREMITIES: No edema, cyanosis, or clubbing.  SKIN: Warm and dry.  NEUROLOGICAL: The patient moves all 4 extremities. Cranial nerves grossly intact. Speech is normal. Disposition: 01-Home or Self Care     Medication List    STOP taking these medications        clopidogrel 75 MG tablet  Commonly known as:  PLAVIX      TAKE these medications        acetaminophen 325 MG tablet  Commonly known as:  TYLENOL  Take 650 mg by mouth every 6 (six) hours as needed for headache.     amLODipine 5 MG tablet  Commonly known as:  NORVASC  Take 1 tablet (5 mg total) by mouth daily.     aspirin EC 81 MG tablet  Take 1 tablet (81 mg total) by mouth daily.     atorvastatin 40 MG tablet  Commonly known as:  LIPITOR  Take 40 mg by mouth daily.     benazepril 20 MG tablet  Commonly known as:  LOTENSIN  Take 20 mg by mouth daily.     cholecalciferol 1000 units tablet  Commonly known as:  VITAMIN D  Take 1,000 Units by mouth every other day.     glipiZIDE 2.5 MG 24 hr tablet  Commonly known as:  GLUCOTROL XL  Take 2.5 mg by mouth daily.     loperamide 2 MG capsule  Commonly known as:  IMODIUM  Take 4 mg by mouth as needed for diarrhea or loose stools.     loratadine 10 MG tablet  Commonly known as:  CLARITIN  Take 10  mg by mouth daily as needed for allergies.     LUMIGAN 0.01 % Soln  Generic drug:  bimatoprost  Place 1 drop into both eyes at bedtime.     metoprolol tartrate 25 MG tablet  Commonly known as:  LOPRESSOR  Take 25 mg by mouth daily.     nitroGLYCERIN 0.4 MG SL tablet  Commonly known as:  NITROSTAT  Place 0.4 mg under the tongue every 5 (five) minutes as needed for chest pain.     ondansetron 4 MG tablet  Commonly known as:  ZOFRAN  Take 1 tablet (4 mg total) by mouth every 8 (eight) hours as needed for nausea or vomiting.      pantoprazole 40 MG tablet  Commonly known as:  PROTONIX  Take 1 tablet (40 mg total) by mouth daily.     Potassium Chloride ER 20 MEQ Tbcr  Take 10 mEq by mouth daily.     ticagrelor 90 MG Tabs tablet  Commonly known as:  BRILINTA  Take 1 tablet (90 mg total) by mouth 2 (two) times daily.           Follow-up Information    Follow up with [facility], MD. Schedule an appointment as soon as possible for a visit in 1 week.   Specialty:  Cardiology   Contact information:   [place] [address] [phone]       Signed: Birdie Riddle 07/17/2016, 1:07 PM

## 2016-07-17 NOTE — Care Management Important Message (Signed)
Important Message  Patient Details  Name: Heidi Wolf MRN: [id number] Date of Birth: [birth year]   Medicare Important Message Given:  Yes    Teighlor Korson Abena 07/17/2016, 11:03 AM

## 2016-07-17 NOTE — Discharge Instructions (Signed)
Cath Site Care Refer to this sheet in the next few weeks. These instructions provide you with information about caring for yourself after your procedure. Your health care provider may also give you more specific instructions. Your treatment has been planned according to current medical practices, but problems sometimes occur. Call your health care provider if you have any problems or questions after your procedure. WHAT TO EXPECT AFTER THE PROCEDURE After your procedure, it is typical to have the following:  Bruising at the site that usually fades within 1-2 weeks.  Blood collecting in the tissue (hematoma) that may be painful to the touch. It should usually decrease in size and tenderness within 1-2 weeks. HOME CARE INSTRUCTIONS  Take medicines only as directed by your health care provider.  You may shower 24-48 hours after the procedure or as directed by your health care provider. Remove the bandage (dressing) and gently wash the site with plain soap and water. Pat the area dry with a clean towel. Do not rub the site, because this may cause bleeding.  Do not take baths, swim, or use a hot tub until your health care provider approves.  Check your insertion site every day for redness, swelling, or drainage.  Do not apply powder or lotion to the site.  Do not flex or bend the affected extremity for 24 hours or as directed by your health care provider.  Do not push or pull heavy objects with the affected extremity for 24 hours or as directed by your health care provider.  Do not lift over 10 lb (4.5 kg) for 5 days after your procedure or as directed by your health care provider.  Ask your health care provider when it is okay to:  Return to work or school.  Resume usual physical activities or sports.  Resume sexual activity.  Do not drive home if you are discharged the same day as the procedure. Have someone else drive you.  You may drive 24 hours after the procedure unless otherwise  instructed by your health care provider.  Do not operate machinery or power tools for 24 hours after the procedure.  If your procedure was done as an outpatient procedure, which means that you went home the same day as your procedure, a responsible adult should be with you for the first 24 hours after you arrive home.  Keep all follow-up visits as directed by your health care provider. This is important. SEEK MEDICAL CARE IF:  You have a fever.  You have chills.  You have increased bleeding from the radial site. Hold pressure on the site. SEEK IMMEDIATE MEDICAL CARE IF:  You have unusual pain at the radial site.  You have redness, warmth, or swelling at the radial site.  You have drainage (other than a small amount of blood on the dressing) from the radial site.  The radial site is bleeding, and the bleeding does not stop after 30 minutes of holding steady pressure on the site.  Your arm or hand becomes pale, cool, tingly, or numb.   This information is not intended to replace advice given to you by your health care provider. Make sure you discuss any questions you have with your health care provider.   Document Released: 01/17/2011 Document Revised: 01/05/2015 Document Reviewed: 07/03/2014 Elsevier Interactive Patient Education [employer].

## 2016-07-17 NOTE — Care Management Note (Signed)
Case Management Note  Patient Details  Name: Heidi Wolf MRN: [id number] Date of Birth: 11/19/1941  Subjective/Objective:  Pt presented for chest pain. Pt is from home with family support. Plan to d/c today on Brilinta.                   Action/Plan: 30 day free card provided to pt.  S/W [facility] @ [employer] [facility] # [phone]   BRILINTA 90 MG BID DAILY (30 )  COVER- YES  CO-PAY- $3.70  TIER-3 [employer]  PRIOR APPROVAL-NO  PHARMACY: [facility]   TICAGRELOR- NOT ON FORMULARY  PATIENT MEDICAID ALSO  MAIL-ORDER FOR 90 DAY SUPPLY $3.70   Expected Discharge Date:  07/16/16               Expected Discharge Plan:     In-House Referral:  NA  Discharge planning Services     Post Acute Care Choice:  NA Choice offered to:  NA  DME Arranged:  N/A DME Agency:  NA  HH Arranged:  NA HH Agency:  NA  Status of Service:  Completed, signed off  If discussed at [place] of Stay Meetings, dates discussed:    Additional Comments:  Bethena Roys, RN 07/17/2016, 3:04 PM

## 2016-07-23 DIAGNOSIS — M25561 Pain in right knee: Secondary | ICD-10-CM | POA: Diagnosis not present

## 2016-07-23 DIAGNOSIS — M25562 Pain in left knee: Secondary | ICD-10-CM | POA: Diagnosis not present

## 2016-07-23 DIAGNOSIS — E119 Type 2 diabetes mellitus without complications: Secondary | ICD-10-CM | POA: Diagnosis not present

## 2016-07-23 DIAGNOSIS — Z9861 Coronary angioplasty status: Secondary | ICD-10-CM | POA: Diagnosis not present

## 2016-07-23 DIAGNOSIS — I10 Essential (primary) hypertension: Secondary | ICD-10-CM | POA: Diagnosis not present

## 2016-07-23 DIAGNOSIS — I2510 Atherosclerotic heart disease of native coronary artery without angina pectoris: Secondary | ICD-10-CM | POA: Diagnosis not present

## 2016-07-30 DIAGNOSIS — I2510 Atherosclerotic heart disease of native coronary artery without angina pectoris: Secondary | ICD-10-CM | POA: Diagnosis not present

## 2016-07-30 DIAGNOSIS — M25561 Pain in right knee: Secondary | ICD-10-CM | POA: Diagnosis not present

## 2016-07-30 DIAGNOSIS — E119 Type 2 diabetes mellitus without complications: Secondary | ICD-10-CM | POA: Diagnosis not present

## 2016-07-30 DIAGNOSIS — M25562 Pain in left knee: Secondary | ICD-10-CM | POA: Diagnosis not present

## 2016-07-30 DIAGNOSIS — Z9861 Coronary angioplasty status: Secondary | ICD-10-CM | POA: Diagnosis not present

## 2016-07-30 DIAGNOSIS — I10 Essential (primary) hypertension: Secondary | ICD-10-CM | POA: Diagnosis not present

## 2016-08-04 ENCOUNTER — Telehealth: Payer: Self-pay | Admitting: Cardiovascular Disease

## 2016-08-06 ENCOUNTER — Encounter (HOSPITAL_COMMUNITY): Payer: Self-pay

## 2016-08-06 ENCOUNTER — Emergency Department (HOSPITAL_COMMUNITY)
Admission: EM | Admit: 2016-08-06 | Discharge: 2016-08-06 | Disposition: A | Discharge status: Home / Self Care | Payer: Medicare Other | Attending: Emergency Medicine | Admitting: Emergency Medicine

## 2016-08-06 ENCOUNTER — Emergency Department (HOSPITAL_COMMUNITY): Payer: Medicare Other

## 2016-08-06 DIAGNOSIS — Z7984 Long term (current) use of oral hypoglycemic drugs: Secondary | ICD-10-CM | POA: Insufficient documentation

## 2016-08-06 DIAGNOSIS — E119 Type 2 diabetes mellitus without complications: Secondary | ICD-10-CM | POA: Insufficient documentation

## 2016-08-06 DIAGNOSIS — R079 Chest pain, unspecified: Secondary | ICD-10-CM

## 2016-08-06 DIAGNOSIS — Z959 Presence of cardiac and vascular implant and graft, unspecified: Secondary | ICD-10-CM | POA: Insufficient documentation

## 2016-08-06 DIAGNOSIS — Z7982 Long term (current) use of aspirin: Secondary | ICD-10-CM | POA: Diagnosis not present

## 2016-08-06 DIAGNOSIS — R002 Palpitations: Secondary | ICD-10-CM | POA: Diagnosis not present

## 2016-08-06 DIAGNOSIS — I10 Essential (primary) hypertension: Secondary | ICD-10-CM | POA: Insufficient documentation

## 2016-08-06 DIAGNOSIS — R0789 Other chest pain: Secondary | ICD-10-CM | POA: Diagnosis not present

## 2016-08-06 DIAGNOSIS — R072 Precordial pain: Secondary | ICD-10-CM | POA: Diagnosis not present

## 2016-08-06 LAB — CBC
HCT: 42.1 % (ref 36.0–46.0)
Hemoglobin: 13.9 g/dL (ref 12.0–15.0)
MCH: 26.9 pg (ref 26.0–34.0)
MCHC: 33 g/dL (ref 30.0–36.0)
MCV: 81.6 fL (ref 78.0–100.0)
Platelets: 276 10*3/uL (ref 150–400)
RBC: 5.16 MIL/uL — ABNORMAL HIGH (ref 3.87–5.11)
RDW: 13.9 % (ref 11.5–15.5)
WBC: 8.9 10*3/uL (ref 4.0–10.5)

## 2016-08-06 LAB — I-STAT TROPONIN, ED
Troponin i, poc: 0.02 ng/mL (ref 0.00–0.08)
Troponin i, poc: 0.01 ng/mL (ref 0.00–0.08)

## 2016-08-06 LAB — BASIC METABOLIC PANEL
Anion gap: 10 (ref 5–15)
BUN: 12 mg/dL (ref 6–20)
CO2: 21 mmol/L — ABNORMAL LOW (ref 22–32)
Calcium: 9.9 mg/dL (ref 8.9–10.3)
Chloride: 106 mmol/L (ref 101–111)
Creatinine, Ser: 0.95 mg/dL (ref 0.44–1.00)
GFR calc Af Amer: >60 mL/min (ref >60)
GFR calc non Af Amer: 57 mL/min — ABNORMAL LOW (ref >60)
Glucose, Bld: 131 mg/dL — ABNORMAL HIGH (ref 65–99)
Potassium: 3.6 mmol/L (ref 3.5–5.1)
Sodium: 137 mmol/L (ref 135–145)

## 2016-08-06 NOTE — ED Triage Notes (Signed)
Per pt, Pt is coming from home with complaints of palpitations and chest pain that woke patient up this morning. Pt reports Hx of two stent placements. Denies SOB, N/V. Complains of "head feeling funny."

## 2016-08-06 NOTE — ED Notes (Signed)
Pt. Transported to xray at this time.  

## 2016-08-06 NOTE — ED Provider Notes (Addendum)
[facility] DEPT Provider Note   CSN: [id number] Arrival date & time: 08/06/16  1029  First Provider Contact:  First MD Initiated Contact with Patient 08/06/16 1047        History   Chief Complaint Chief Complaint  Patient presents with  . Palpitations  . Chest Pain    HPI Heidi Wolf is a 75 y.o. female.  Patient with a history of cardiac stent that was placed on June 6. Had persistent chest pain had repeat cardiac cath on June 18. Patient continues to have intermittent symptoms of chest discomfort substernal area associated with palpitations. Patient had symptoms yesterday symptoms started again a small morning and 3 in the morning. Quite frequently symptoms occur at night while in bed. Patient states that the pain is minimal now like to out of 10. Pain is nonradiating. Not associated with shortness of breath not associated with nausea vomiting or diaphoresis.      Past Medical History:  Diagnosis Date  . Anemia   . Anxiety   . Arthritis    "right hip" (09/21/2014)  . Cataracts, both eyes   . Chest pain 07/01/2016  . Colon polyps    Hyperplastic 2003  . External hemorrhoid   . GERD (gastroesophageal reflux disease)   . Glaucoma   . Heart murmur   . Hyperlipidemia   . Hypertension   . Internal hemorrhoid   . Personal history of colonic polyps 03/03/2012  . Type II diabetes mellitus ([place])    type 2    Patient Active Problem List   Diagnosis Date Noted  . Chest pain 07/03/2016  . Acute coronary syndrome ([place]) 07/02/2016  . 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 Chest pain at rest 01/17/2014  . Abdominal pain, periumbilic 99991111  . Personal history of colonic polyps 03/03/2012  . Internal hemorrhoids without mention of complication 99991111    Past Surgical History:  Procedure Laterality Date  . ABDOMINAL HYSTERECTOMY    . APPENDECTOMY     "w/gallbladder"  . CARDIAC CATHETERIZATION  X 2  . CARDIAC CATHETERIZATION N/A  07/03/2016   Procedure: Left Heart Cath and Coronary Angiography;  Surgeon: Dixie Dials, MD;  Location: [facility];  Service: Cardiovascular;  Laterality: N/A;  . CARDIAC CATHETERIZATION N/A 07/03/2016   Procedure: Coronary Stent Intervention;  Surgeon: Jettie Booze, MD;  Location: [facility];  Service: Cardiovascular;  Laterality: N/A;  . CARDIAC CATHETERIZATION N/A 07/15/2016   Procedure: Left Heart Cath and Coronary Angiography;  Surgeon: Dixie Dials, MD;  Location: [facility];  Service: Cardiovascular;  Laterality: N/A;  . CHOLECYSTECTOMY    . COLONOSCOPY    . TUBAL LIGATION  1971    OB History    No data available       Home Medications    Prior to Admission medications   Medication Sig Start Date End Date Taking? Authorizing Provider  acetaminophen (TYLENOL) 325 MG tablet Take 650 mg by mouth every 6 (six) hours as needed for headache.   Yes Historical Provider, MD  amLODipine (NORVASC) 5 MG tablet Take 1 tablet (5 mg total) by mouth daily. 01/17/14  Yes Dixie Dials, MD  aspirin EC 81 MG tablet Take 1 tablet (81 mg total) by mouth daily. 07/04/16  Yes Dixie Dials, MD  atorvastatin (LIPITOR) 40 MG tablet Take 40 mg by mouth every evening.    Yes Historical Provider, MD  benazepril (LOTENSIN) 20 MG tablet Take 20 mg by  mouth daily.   Yes Historical Provider, MD  cholecalciferol (VITAMIN D) 1000 UNITS tablet Take 1,000 Units by mouth every other day.    Yes Historical Provider, MD  glipiZIDE (GLUCOTROL XL) 2.5 MG 24 hr tablet Take 2.5 mg by mouth daily.   Yes Historical Provider, MD  loperamide (IMODIUM) 2 MG capsule Take 4 mg by mouth as needed for diarrhea or loose stools.   Yes Historical Provider, MD  loratadine (CLARITIN) 10 MG tablet Take 10 mg by mouth daily as needed for allergies.    Yes Historical Provider, MD  LUMIGAN 0.01 % SOLN Place 1 drop into both eyes at bedtime.  04/13/14  Yes Historical Provider, MD  metoprolol tartrate (LOPRESSOR) 25 MG  tablet Take 25 mg by mouth daily.    Yes Historical Provider, MD  nitroGLYCERIN (NITROSTAT) 0.4 MG SL tablet Place 0.4 mg under the tongue every 5 (five) minutes as needed for chest pain.    Yes Historical Provider, MD  ondansetron (ZOFRAN) 4 MG tablet Take 1 tablet (4 mg total) by mouth every 8 (eight) hours as needed for nausea or vomiting. 07/09/16  Yes Quintella Reichert, MD  pantoprazole (PROTONIX) 40 MG tablet Take 1 tablet (40 mg total) by mouth daily. 07/04/16  Yes Dixie Dials, MD  potassium chloride 20 MEQ TBCR Take 10 mEq by mouth daily. 07/17/16  Yes Dixie Dials, MD  ticagrelor (BRILINTA) 90 MG TABS tablet Take 1 tablet (90 mg total) by mouth 2 (two) times daily. 07/17/16  Yes Dixie Dials, MD    Family History Family History  Problem Relation Age of Onset  . Heart disease Mother   . Diabetes Mother   . Arthritis Mother   . Heart disease Brother   . Heart disease Sister     x 2  . Stroke Brother   . Stroke Daughter   . Diabetes Maternal Grandmother   . Stroke Maternal Grandmother   . Stroke Father   . Breast cancer      niece  . Colon cancer Neg Hx   . Stomach cancer Neg Hx     Social History Social History  Substance Use Topics  . Smoking status: Never Smoker  . Smokeless tobacco: Never Used  . Alcohol use No     Allergies   Aspirin   Review of Systems Review of Systems  Constitutional: Negative for fever.  HENT: Negative for congestion.   Eyes: Negative for visual disturbance.  Respiratory: Negative for shortness of breath.   Cardiovascular: Positive for chest pain and palpitations.  Gastrointestinal: Negative for abdominal pain, nausea and vomiting.  Genitourinary: Negative for dysuria.  Musculoskeletal: Negative for back pain.  Skin: Negative for rash.  Neurological: Negative for headaches.  Hematological: Does not bruise/bleed easily.  Psychiatric/Behavioral: Negative for confusion.     Physical Exam Updated Vital Signs BP 160/77   Pulse 62    Temp 98.7 F (37.1 C) (Oral)   Resp 13   Ht 5\' 4"  (1.626 m)   Wt 69.4 kg   SpO2 99%   BMI 26.26 kg/m   Physical Exam  Constitutional: She is oriented to person, place, and time. She appears well-developed and well-nourished. No distress.  HENT:  Head: Normocephalic and atraumatic.  Mouth/Throat: Oropharynx is clear and moist.  Eyes: Conjunctivae and EOM are normal. Pupils are equal, round, and reactive to light.  Neck: Normal range of motion.  Cardiovascular: Normal rate, regular rhythm and normal heart sounds.   Pulmonary/Chest: Effort normal and breath sounds  normal. No respiratory distress.  Abdominal: Soft. Bowel sounds are normal. There is no tenderness.  Musculoskeletal: Normal range of motion. She exhibits no edema.  Neurological: She is alert and oriented to person, place, and time. No cranial nerve deficit. She exhibits normal muscle tone. Coordination normal.  Skin: Skin is warm.  Nursing note and vitals reviewed.    ED Treatments / Results  Labs (all labs ordered are listed, but only abnormal results are displayed) Labs Reviewed  BASIC METABOLIC PANEL - Abnormal; Notable for the following:       Result Value   CO2 21 (*)    Glucose, Bld 131 (*)    GFR calc non Af Amer 57 (*)    All other components within normal limits  CBC - Abnormal; Notable for the following:    RBC 5.16 (*)    All other components within normal limits  I-STAT TROPOININ, ED  I-STAT TROPOININ, ED    EKG  EKG Interpretation  Date/Time:  Wednesday August 06 2016 10:41:57 EDT Ventricular Rate:  74 PR Interval:    QRS Duration: 93 QT Interval:  439 QTC Calculation: 488 R Axis:   60 Text Interpretation:  Sinus rhythm Abnormal R-wave progression, early transition Probable LVH with secondary repol abnrm Borderline prolonged QT interval Baseline wander in lead(s) II III aVR aVL aVF V2 V6 Interpretation limited secondary to artifact Confirmed by Rogene Houston  MD, Nicki Reaper [id number]) on 08/06/2016  10:53:23 AM Also confirmed by Rogene Houston  MD, Seriyah Collison [phone]), editor Lorenda Cahill CT, Leda Gauze [phone])  on 08/06/2016 11:38:04 AM       Radiology Dg Chest 2 View  Result Date: 08/06/2016 CLINICAL DATA:  Palpitations since early this morning.  Weakness. EXAM: CHEST  2 VIEW COMPARISON:  07/09/2016 FINDINGS: The cardiac silhouette is borderline to mildly enlarged in size. Thoracic aortic atherosclerosis is noted. An incidental azygos fissure is noted. No airspace consolidation, edema, pleural effusion, or pneumothorax is identified. Right upper quadrant abdominal surgical clips and thoracic spondylosis are noted. IMPRESSION: 1. No active cardiopulmonary disease. 2. Aortic atherosclerosis. Electronically Signed   By: Logan Bores M.D.   On: 08/06/2016 11:41    Procedures Procedures (including critical care time)  Medications Ordered in ED Medications - No data to display   Initial Impression / Assessment and Plan / ED Course  I have reviewed the triage vital signs and the nursing notes.  Pertinent labs & imaging results that were available during my care of the patient were reviewed by me and considered in my medical decision making (see chart for details).  Clinical Course    Patient with a stent patient also with follow-up cardiac cath following the stent without any new findings. Patient with complaint of chest pain on and off frequently but fairly consistent since yesterday. Troponins here negative 2. Patient also with complaint of palpitations. Cardiac monitoring here without any significant findings. Patient's labs without any significant or maladies. Patient will follow-up with cardiology in the next few days. She will return for any new or worse symptoms. No specific findings consistent with any acute cardiac event. Patient just had the stent placed on July 6 and had repeat cardiac cath on July 18.  Final Clinical Impressions(s) / ED Diagnoses   Final diagnoses:  Palpitations  Chest pain,  unspecified chest pain type    New Prescriptions New Prescriptions   No medications on file     Fredia Sorrow, MD 08/06/16 1504    Fredia Sorrow, MD 08/06/16 1505

## 2016-08-06 NOTE — Discharge Instructions (Signed)
Call for early follow-up with cardiology. Today's workup had 2 negative troponins. Return for any new or worse symptoms.

## 2016-08-10 ENCOUNTER — Encounter: Payer: Self-pay | Admitting: Cardiology

## 2016-08-10 NOTE — Progress Notes (Signed)
Cardiology Office Note   Date:  08/11/2016   ID:  Theophilus Bones, DOB [birth year], MRN [id number]  PCP:  No PCP Per Patient  Cardiologist:   Minus Breeding, MD   No chief complaint on file.     History of Present Illness: Heidi Wolf is a 75 y.o. female who presents for follow up of CAD the patient had unstable anigna and ws found to have a high grade stenosis of the diagonal treated with a DES.  There was non obstructive disease elsewhere.  She had recurrent pain and repeat cath and the stent was found to be patent.  She was in the ED last week with palpitations and mild chest pain and ruled out for MI.  She presents as a new patient in our practice having been followed by another cardiologist in town.    She has had continued palpitations.  These happen daily and these are mostly at night.  They are strong isolated beats.  They can keep her awake.  She also has had chest pain.as well.  These are sharp and across her chest.  These are similar to the pain that she had at the time of both caths.  They are sporadic and might occur with her palpitations.  She does not describe associated SOB or nausea.  She has not had any syncope.  There is no PND or orthopnea.  However, she has decreased exercise tolerance and weakness.   Past Medical History:  Diagnosis Date  . Anemia   . Anxiety   . Arthritis    "right hip" (09/21/2014)  . CAD (coronary artery disease)    PCI 90% diagonal stenosis witha DES July 2017.  Nonobstructive disease elsewhere.   . Cataracts, both eyes   . Colon polyps    Hyperplastic 2003  . External hemorrhoid   . GERD (gastroesophageal reflux disease)   . Glaucoma   . Hyperlipidemia   . Hypertension   . Internal hemorrhoid   . Personal history of colonic polyps 03/03/2012  . Type II diabetes mellitus ([place])    type 2    Past Surgical History:  Procedure Laterality Date  . ABDOMINAL HYSTERECTOMY    . APPENDECTOMY     "w/gallbladder"  . CARDIAC CATHETERIZATION  X  2  . CARDIAC CATHETERIZATION N/A 07/03/2016   Procedure: Left Heart Cath and Coronary Angiography;  Surgeon: Dixie Dials, MD;  Location: [facility];  Service: Cardiovascular;  Laterality: N/A;  . CARDIAC CATHETERIZATION N/A 07/03/2016   Procedure: Coronary Stent Intervention;  Surgeon: Jettie Booze, MD;  Location: [facility];  Service: Cardiovascular;  Laterality: N/A;  . CARDIAC CATHETERIZATION N/A 07/15/2016   Procedure: Left Heart Cath and Coronary Angiography;  Surgeon: Dixie Dials, MD;  Location: [facility];  Service: Cardiovascular;  Laterality: N/A;  . CHOLECYSTECTOMY    . COLONOSCOPY    . TUBAL LIGATION  1971     Current Outpatient Prescriptions  Medication Sig Dispense Refill  . acetaminophen (TYLENOL) 325 MG tablet Take 650 mg by mouth every 6 (six) hours as needed for headache.    Marland Kitchen amLODipine (NORVASC) 5 MG tablet Take 1 tablet (5 mg total) by mouth daily. 30 tablet 1  . aspirin EC 81 MG tablet Take 1 tablet (81 mg total) by mouth daily.    Marland Kitchen atorvastatin (LIPITOR) 40 MG tablet Take 40 mg by mouth every evening.     . benazepril (LOTENSIN) 20 MG tablet Take 20  mg by mouth daily.    . cholecalciferol (VITAMIN D) 1000 UNITS tablet Take 1,000 Units by mouth every other day.     Marland Kitchen glipiZIDE (GLUCOTROL XL) 2.5 MG 24 hr tablet Take 2.5 mg by mouth daily.    Marland Kitchen loperamide (IMODIUM) 2 MG capsule Take 4 mg by mouth as needed for diarrhea or loose stools.    Marland Kitchen loratadine (CLARITIN) 10 MG tablet Take 10 mg by mouth daily as needed for allergies.     Marland Kitchen LUMIGAN 0.01 % SOLN Place 1 drop into both eyes at bedtime.     . metoprolol tartrate (LOPRESSOR) 25 MG tablet Take 1 tablet (25 mg total) by mouth 2 (two) times daily. 180 tablet 3  . nitroGLYCERIN (NITROSTAT) 0.4 MG SL tablet Place 0.4 mg under the tongue every 5 (five) minutes as needed for chest pain.     Marland Kitchen ondansetron (ZOFRAN) 4 MG tablet Take 1 tablet (4 mg total) by mouth every 8 (eight) hours as needed for  nausea or vomiting. 8 tablet 0  . pantoprazole (PROTONIX) 40 MG tablet Take 1 tablet (40 mg total) by mouth daily. 30 tablet 6  . potassium chloride 20 MEQ TBCR Take 10 mEq by mouth daily. 30 tablet 3  . ticagrelor (BRILINTA) 90 MG TABS tablet Take 1 tablet (90 mg total) by mouth 2 (two) times daily. 60 tablet 6   No current facility-administered medications for this visit.     Allergies:   Aspirin    Social History:  The patient  reports that she has never smoked. She has never used smokeless tobacco. She reports that she does not drink alcohol or use drugs.   Family History:  The patient's family history includes Arthritis in her mother; CAD in her brother; CAD (age of onset: 21) in her mother; CAD (age of onset: 49) in her sister; Diabetes in her maternal grandmother and mother; Heart failure in her sister; Kidney failure in her sister; Stroke in her brother, daughter, father, and maternal grandmother.   ROS:  Please see the history of present illness.   Otherwise, review of systems are positive for positive for hand tingling some difficulty swallowing food and heartburn.   All other systems are reviewed and negative.    PHYSICAL EXAM: VS:  BP 140/88 (BP Location: Left Arm, Patient Position: Sitting, Cuff Size: Normal)   Pulse 60   Ht 5\' 4"  (1.626 m)   Wt 154 lb 2 oz (69.9 kg)   BMI 26.46 kg/m  , BMI Body mass index is 26.46 kg/m. GENERAL:  Well appearing HEENT:  Pupils equal round and reactive, fundi not visualized, oral mucosa unremarkable NECK:  No jugular venous distention, waveform within normal limits, carotid upstroke brisk and symmetric, no bruits, no thyromegaly LYMPHATICS:  No cervical, inguinal adenopathy LUNGS:  Clear to auscultation bilaterally BACK:  No CVA tenderness CHEST:  Unremarkable HEART:  PMI not displaced or sustained,S1 and S2 within normal limits, no S3, no S4, no clicks, no rubs, soft systolic murmur at the apex, no diastolic murmurs ABD:  Flat, positive  bowel sounds normal in frequency in pitch, no bruits, no rebound, no guarding, no midline pulsatile mass, no hepatomegaly, no splenomegaly EXT:  2 plus pulses throughout, no edema, no cyanosis no clubbing SKIN:  No rashes no nodules NEURO:  Cranial nerves II through XII grossly intact, motor grossly intact throughout PSYCH:  Cognitively intact, oriented to person place and time    EKG:  EKG is ordered today. The  ekg ordered today demonstrates sinus rhythm with premature atrial contractions, rate 61, axis within normal limits, left ventricular cavity with repolarization changes and dizziness.   Recent Labs: 07/14/2016: ALT 17 08/06/2016: BUN 12; Creatinine, Ser 0.95; Hemoglobin 13.9; Platelets 276; Potassium 3.6; Sodium 137    Lipid Panel    Component Value Date/Time   CHOL 108 07/15/2016 0111   TRIG 101 07/15/2016 0111   HDL 41 07/15/2016 0111   CHOLHDL 2.6 07/15/2016 0111   VLDL 20 07/15/2016 0111   LDLCALC 47 07/15/2016 0111      Wt Readings from Last 3 Encounters:  08/11/16 154 lb 2 oz (69.9 kg)  08/06/16 153 lb (69.4 kg)  07/17/16 154 lb 4.8 oz (70 kg)      Other studies Reviewed: Additional studies/ records that were reviewed today include: Hospital records, I reviewed the films myself. Review of the above records demonstrates:  Please see elsewhere in the note.     ASSESSMENT AND PLAN:  CAD:  Is not clear that she's having angina as her vessels were patent. She did not tolerate Imdur before because of headaches. I am going to manage her medically in the context of treating her palpitations.  PALPITATIONS:  She does have tachycardia palpitations as described.  I will apply a Holter.  I will increase her beta blocker to bid.  We will continue to titrate meds although we will be limited by low HR.   I will check a TSH and BMET.   HTN:   This is being managed in the context of treating her palpitatoins.    DM:   We will try to help her find a PCP. Lab Results    Component Value Date   HGBA1C 6.2 (H) 09/21/2014    HYPERLIPIDEMIA:  She will continue current meds.    Current medicines are reviewed at length with the patient today.  The patient does not have concerns regarding medicines.  The following changes have been made:  no change  Labs/ tests ordered today include:   Orders Placed This Encounter  Procedures  . TSH  . Basic metabolic panel  . Holter monitor - 24 hour  . EKG 12-Lead     Disposition:   FU with an APP in two weeks.     Signed, Minus Breeding, MD  08/11/2016 8:04 AM    [place]  ca

## 2016-08-11 ENCOUNTER — Encounter: Payer: Self-pay | Admitting: Cardiology

## 2016-08-11 ENCOUNTER — Ambulatory Visit (INDEPENDENT_AMBULATORY_CARE_PROVIDER_SITE_OTHER): Payer: Medicare Other | Admitting: Cardiology

## 2016-08-11 VITALS — BP 140/88 | HR 60 | Ht 64.0 in | Wt 154.1 lb

## 2016-08-11 DIAGNOSIS — R002 Palpitations: Secondary | ICD-10-CM

## 2016-08-11 DIAGNOSIS — I25110 Atherosclerotic heart disease of native coronary artery with unstable angina pectoris: Secondary | ICD-10-CM | POA: Diagnosis not present

## 2016-08-11 LAB — BASIC METABOLIC PANEL
BUN: 16 mg/dL (ref 7–25)
CO2: 28 mmol/L (ref 20–31)
Calcium: 10 mg/dL (ref 8.6–10.4)
Chloride: 104 mmol/L (ref 98–110)
Creat: 1.07 mg/dL — ABNORMAL HIGH (ref 0.60–0.93)
Glucose, Bld: 74 mg/dL (ref 65–99)
Potassium: 4.4 mmol/L (ref 3.5–5.3)
Sodium: 139 mmol/L (ref 135–146)

## 2016-08-11 LAB — TSH: TSH: 0.7 mIU/L

## 2016-08-11 MED ORDER — METOPROLOL TARTRATE 25 MG PO TABS: 25.0000 mg | ORAL_TABLET | Freq: Two times a day (BID) | ORAL | 3 refills | Status: DC

## 2016-08-11 NOTE — Patient Instructions (Signed)
Medication Instructions:   INCREASE METOPROLOL TO 25 MG TWICE DAILY  Labwork:  Your physician recommends that you HAVE LAB WORK TODAY  Testing/Procedures:  Your physician has recommended that you wear a 24 HOUR holter monitor. Holter monitors are medical devices that record the heart's electrical activity. Doctors most often use these monitors to diagnose arrhythmias. Arrhythmias are problems with the speed or rhythm of the heartbeat. The monitor is a small, portable device. You can wear one while you do your normal daily activities. This is usually used to diagnose what is causing palpitations/syncope (passing out).    Follow-Up:  Your physician recommends that you schedule a follow-up appointment in: [place] OR [place] IN 2 WEEKS

## 2016-08-12 ENCOUNTER — Telehealth: Payer: Self-pay | Admitting: Cardiology

## 2016-08-12 MED ORDER — TICAGRELOR 90 MG PO TABS: 90.0000 mg | ORAL_TABLET | Freq: Two times a day (BID) | ORAL | 0 refills | Status: DC

## 2016-08-12 NOTE — Telephone Encounter (Signed)
New message      Pt called stated that the pharmacy and Medicare part d will not pay or fill any prescription without Dr. Rosezella [place] name being on the prescription. Please call.  *STAT* If patient is at the pharmacy, call can be transferred to refill team.   1. Which medications need to be refilled? (please list name of each medication and dose if known) Brilinta 90mg ,   2. Which pharmacy/location (including street and city if local pharmacy) is medication to be sent to? [facility] [address]   3. Do they need a 30 day or 90 day supply? [place]

## 2016-08-12 NOTE — Telephone Encounter (Signed)
Med refilled electronically

## 2016-08-13 ENCOUNTER — Other Ambulatory Visit: Payer: Self-pay | Admitting: Cardiology

## 2016-08-13 ENCOUNTER — Ambulatory Visit (INDEPENDENT_AMBULATORY_CARE_PROVIDER_SITE_OTHER): Payer: Medicare Other

## 2016-08-13 DIAGNOSIS — R002 Palpitations: Secondary | ICD-10-CM

## 2016-08-13 MED ORDER — PANTOPRAZOLE SODIUM 40 MG PO TBEC: 40.0000 mg | DELAYED_RELEASE_TABLET | Freq: Every day | ORAL | 12 refills | Status: DC

## 2016-08-13 MED ORDER — NITROGLYCERIN 0.4 MG SL SUBL: 0.4000 mg | SUBLINGUAL_TABLET | SUBLINGUAL | 12 refills | Status: DC | PRN

## 2016-08-13 MED ORDER — PANTOPRAZOLE SODIUM 40 MG PO TBEC: 40.0000 mg | DELAYED_RELEASE_TABLET | Freq: Every day | ORAL | 3 refills | Status: DC

## 2016-08-13 MED ORDER — POTASSIUM CHLORIDE ER 20 MEQ PO TBCR: 20.0000 meq | EXTENDED_RELEASE_TABLET | Freq: Every day | ORAL | 12 refills | Status: DC

## 2016-08-13 MED ORDER — AMLODIPINE BESYLATE 5 MG PO TABS: 5.0000 mg | ORAL_TABLET | Freq: Every day | ORAL | 12 refills | Status: DC

## 2016-08-13 MED ORDER — POTASSIUM CHLORIDE ER 20 MEQ PO TBCR: 20.0000 meq | EXTENDED_RELEASE_TABLET | Freq: Every day | ORAL | 3 refills | Status: DC

## 2016-08-13 MED ORDER — ATORVASTATIN CALCIUM 40 MG PO TABS: 40.0000 mg | ORAL_TABLET | Freq: Every evening | ORAL | 3 refills | Status: DC

## 2016-08-13 MED ORDER — BENAZEPRIL HCL 20 MG PO TABS: 20.0000 mg | ORAL_TABLET | Freq: Every day | ORAL | 3 refills | Status: DC

## 2016-08-13 MED ORDER — ATORVASTATIN CALCIUM 40 MG PO TABS: 40.0000 mg | ORAL_TABLET | Freq: Every evening | ORAL | 12 refills | Status: DC

## 2016-08-13 NOTE — Telephone Encounter (Signed)
Spoke with pt, aware that we will refill all her medications except glipizide and ondansetron.

## 2016-08-13 NOTE — Telephone Encounter (Signed)
New Message   *STAT* If patient is at the pharmacy, call can be transferred to refill team.   1. Which medications need to be refilled? (please list name of each medication and dose if known)  Amlodipine Lipitor benazepril glipizide metoprolol tartrate nitroglycerin ondansetron pantoprazole potassium chloride ticagrelor  2. Which pharmacy/location (including street and city if local pharmacy) is medication to be sent to?   [place], [place] [address] [phone]  3. Do they need a 30 day or 90 day supply?   90 day  Pt expressed someone was suppose to contact the pharmacist but no one has.  Pt expressed new MD needs to be listed under the refill instead of the old/previous Md.  Please follow up with [facility]. Thanks!

## 2016-08-14 ENCOUNTER — Telehealth: Payer: Self-pay

## 2016-08-14 NOTE — Telephone Encounter (Signed)
Received a call from patient.She stated insurance is denying payment for [place].Samples of Brilinta 90 mg left at front desk for pick up.[employer] called [phone] prior authorization done will take 24 hours for approval. Patient also stated she needed a PCP and no one called her back.Advised I will have our scheduler call her back with a appointment.

## 2016-08-15 NOTE — Telephone Encounter (Signed)
Received fax from [place], brilinta has been approved.

## 2016-08-27 DIAGNOSIS — E119 Type 2 diabetes mellitus without complications: Secondary | ICD-10-CM | POA: Diagnosis not present

## 2016-08-27 DIAGNOSIS — H401131 Primary open-angle glaucoma, bilateral, mild stage: Secondary | ICD-10-CM | POA: Diagnosis not present

## 2016-08-27 DIAGNOSIS — H2513 Age-related nuclear cataract, bilateral: Secondary | ICD-10-CM | POA: Diagnosis not present

## 2016-08-27 LAB — HM DIABETES EYE EXAM

## 2016-09-03 ENCOUNTER — Ambulatory Visit (INDEPENDENT_AMBULATORY_CARE_PROVIDER_SITE_OTHER): Payer: Medicare Other | Admitting: Cardiology

## 2016-09-03 ENCOUNTER — Encounter: Payer: Self-pay | Admitting: Cardiology

## 2016-09-03 DIAGNOSIS — Z9861 Coronary angioplasty status: Secondary | ICD-10-CM

## 2016-09-03 DIAGNOSIS — E1159 Type 2 diabetes mellitus with other circulatory complications: Secondary | ICD-10-CM

## 2016-09-03 DIAGNOSIS — I10 Essential (primary) hypertension: Secondary | ICD-10-CM | POA: Insufficient documentation

## 2016-09-03 DIAGNOSIS — E785 Hyperlipidemia, unspecified: Secondary | ICD-10-CM | POA: Diagnosis not present

## 2016-09-03 DIAGNOSIS — I152 Hypertension secondary to endocrine disorders: Secondary | ICD-10-CM | POA: Insufficient documentation

## 2016-09-03 DIAGNOSIS — I2510 Atherosclerotic heart disease of native coronary artery without angina pectoris: Secondary | ICD-10-CM | POA: Diagnosis not present

## 2016-09-03 DIAGNOSIS — E1169 Type 2 diabetes mellitus with other specified complication: Secondary | ICD-10-CM | POA: Insufficient documentation

## 2016-09-03 DIAGNOSIS — R002 Palpitations: Secondary | ICD-10-CM

## 2016-09-03 HISTORY — DX: Palpitations: R00.2

## 2016-09-03 NOTE — Patient Instructions (Signed)
Medication Instructions:  Your physician recommends that you continue on your current medications as directed. Please refer to the Current Medication list given to you today.  Labwork: None ordered   Testing/Procedures: None ordered  Follow-Up: Your physician recommends that you schedule a follow-up appointment in: 4 months with DR [facility].   Any Other Special Instructions Will Be Listed Below (If Applicable).     If you need a refill on your cardiac medications before your next appointment, please call your pharmacy.

## 2016-09-03 NOTE — Assessment & Plan Note (Signed)
PCI/DES to Dx1 07/03/16- relook OK 07/15/16 No angina

## 2016-09-03 NOTE — Assessment & Plan Note (Signed)
Type 2 NIDDM with CAD  Appointment made with [facility] to be established there

## 2016-09-03 NOTE — Progress Notes (Signed)
09/03/2016 Heidi Wolf   02/05/1941  [id number]  Primary Physician [facility] Primary Cardiologist: Dr Percival Spanish  HPI:  75 y/o AA female with a history of CAD, s/p Dx PCI/ DES 07/03/16. Re look 07/15/16 showed patent stent. She had no other significant CAD and LV wall motion was normal at cath. She saw Dr Percival Spanish 08/11/16. She had complained of intermittent palpitations. A Holter monitor showed PACs. Labs were unremarkable. Her beta blocker was increased and she is in the office today for follow up. She denies any sustained tachycardia. She says she is "moving a little slow" but denies chest pain, DOE, orthopnea.    Current Outpatient Prescriptions  Medication Sig Dispense Refill  . acetaminophen (TYLENOL) 325 MG tablet Take 650 mg by mouth every 6 (six) hours as needed for headache.    Marland Kitchen amLODipine (NORVASC) 5 MG tablet Take 1 tablet (5 mg total) by mouth daily. 30 tablet 12  . aspirin EC 81 MG tablet Take 1 tablet (81 mg total) by mouth daily.    Marland Kitchen atorvastatin (LIPITOR) 40 MG tablet Take 1 tablet (40 mg total) by mouth every evening. 90 tablet 3  . benazepril (LOTENSIN) 20 MG tablet Take 1 tablet (20 mg total) by mouth daily. 90 tablet 3  . cholecalciferol (VITAMIN D) 1000 UNITS tablet Take 1,000 Units by mouth every other day.     Marland Kitchen glipiZIDE (GLUCOTROL XL) 2.5 MG 24 hr tablet Take 2.5 mg by mouth daily.    Marland Kitchen loperamide (IMODIUM) 2 MG capsule Take 4 mg by mouth as needed for diarrhea or loose stools.    Marland Kitchen loratadine (CLARITIN) 10 MG tablet Take 10 mg by mouth daily as needed for allergies.     Marland Kitchen LUMIGAN 0.01 % SOLN Place 1 drop into both eyes at bedtime.     . metoprolol tartrate (LOPRESSOR) 25 MG tablet Take 1 tablet (25 mg total) by mouth 2 (two) times daily. 180 tablet 3  . nitroGLYCERIN (NITROSTAT) 0.4 MG SL tablet Place 1 tablet (0.4 mg total) under the tongue every 5 (five) minutes as needed for chest pain. 25 tablet 12  . ondansetron (ZOFRAN) 4 MG tablet Take 1 tablet (4  mg total) by mouth every 8 (eight) hours as needed for nausea or vomiting. 8 tablet 0  . pantoprazole (PROTONIX) 40 MG tablet Take 1 tablet (40 mg total) by mouth daily. 90 tablet 3  . Potassium Chloride ER 20 MEQ TBCR Take 20 mEq by mouth daily. 90 tablet 3  . ticagrelor (BRILINTA) 90 MG TABS tablet Take 1 tablet (90 mg total) by mouth 2 (two) times daily. 180 tablet 0   No current facility-administered medications for this visit.     Allergies  Allergen Reactions  . Aspirin Other (See Comments)    Makes stomach burn    Social History   Social History  . Marital status: Legally Separated    Spouse name: N/A  . Number of children: 3  . Years of education: N/A   Occupational History  . retired    Social History Main Topics  . Smoking status: Never Smoker  . Smokeless tobacco: Never Used  . Alcohol use No  . Drug use: No  . Sexual activity: No   Other Topics Concern  . Not on file   Social History Narrative   LIves with Tammy     Review of Systems: General: negative for chills, fever, night sweats or weight changes.  Cardiovascular: negative for chest  pain, dyspnea on exertion, edema, orthopnea, palpitations, paroxysmal nocturnal dyspnea or shortness of breath Dermatological: negative for rash Respiratory: negative for cough or wheezing Urologic: negative for hematuria Abdominal: negative for nausea, vomiting, diarrhea, bright red blood per rectum, melena, or hematemesis Neurologic: negative for visual changes, syncope, or dizziness All other systems reviewed and are otherwise negative except as noted above.    Blood pressure (!) 142/82, pulse 64, height 5\' 4"  (1.626 m), weight 156 lb (70.8 kg).  General appearance: alert, cooperative and no distress Neck: no carotid bruit and no JVD Lungs: clear to auscultation bilaterally Heart: regular rate and rhythm with frequent extra systole Extremities: extremities normal, atraumatic, no cyanosis or edema Skin: Skin  color, texture, turgor normal. No rashes or lesions Neurologic: Grossly normal  ASSESSMENT AND PLAN:   CAD- S/P PCI July 2017 PCI/DES to Dx1 07/03/16- relook OK 07/15/16 No angina  Palpitations Holter Aug 2017- PACs, no arrhythmia.   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HCC) Type 2 NIDDM with CAD  Appointment made with [facility] to be established there  Dyslipidemia On statin Rx-LDL 47 in July 2017  Essential hypertension Controlled   PLAN  I reassured her the Holter did not suggest any significant arrhythmia. She knows to contact us if she has any sustained tachycardia. Follow up with Dr Percival Spanish in 3-4 months.   Kerin Ransom PA-C 09/03/2016 10:21 AM

## 2016-09-03 NOTE — Assessment & Plan Note (Signed)
Holter Aug 2017- PACs, no arrhythmia.

## 2016-09-03 NOTE — Assessment & Plan Note (Signed)
Controlled.  

## 2016-09-03 NOTE — Assessment & Plan Note (Signed)
On statin Rx-LDL 47 in July 2017

## 2016-09-09 ENCOUNTER — Ambulatory Visit: Payer: Medicare Other | Admitting: Cardiovascular Disease

## 2016-09-15 ENCOUNTER — Telehealth: Payer: Self-pay | Admitting: Cardiology

## 2016-09-15 NOTE — Telephone Encounter (Signed)
Not sure why patient called. Would we have set her up for PCP appt?

## 2016-09-15 NOTE — Telephone Encounter (Signed)
New Message  Pt call requesting to speak with RN to find out why NEW PT appt was made Dr. Ola Spurr on 9/25. Please call back to discuss

## 2016-09-15 NOTE — Telephone Encounter (Signed)
Pt set up to see new PCP on 9/25 - also notes an appt on 10/26 which was made for sickle cell center. Patient notes she does not have sickle cell and doesn't know why this appt was made and wished to cancel it. Advised pt I would cancel appt for her for 10/26 and that if she needs anything else prior to PCP appt, to call. Pt voiced thanks.

## 2016-09-22 ENCOUNTER — Ambulatory Visit (INDEPENDENT_AMBULATORY_CARE_PROVIDER_SITE_OTHER): Payer: Medicare Other | Admitting: Internal Medicine

## 2016-09-22 ENCOUNTER — Encounter: Payer: Self-pay | Admitting: Internal Medicine

## 2016-09-22 VITALS — BP 128/64 | HR 57 | Temp 98.1°F | Ht 64.0 in | Wt 159.0 lb

## 2016-09-22 DIAGNOSIS — E1159 Type 2 diabetes mellitus with other circulatory complications: Secondary | ICD-10-CM

## 2016-09-22 DIAGNOSIS — Z23 Encounter for immunization: Secondary | ICD-10-CM

## 2016-09-22 DIAGNOSIS — I10 Essential (primary) hypertension: Secondary | ICD-10-CM | POA: Diagnosis not present

## 2016-09-22 DIAGNOSIS — Z9861 Coronary angioplasty status: Secondary | ICD-10-CM | POA: Diagnosis not present

## 2016-09-22 DIAGNOSIS — I2510 Atherosclerotic heart disease of native coronary artery without angina pectoris: Secondary | ICD-10-CM | POA: Diagnosis not present

## 2016-09-22 LAB — POCT GLYCOSYLATED HEMOGLOBIN (HGB A1C): Hemoglobin A1C: 5.8

## 2016-09-22 MED ORDER — TETANUS-DIPHTH-ACELL PERTUSSIS 5-2.5-18.5 LF-MCG/0.5 IM SUSP: 0.5000 mL | Freq: Once | INTRAMUSCULAR | 0 refills | Status: AC

## 2016-09-22 NOTE — Progress Notes (Signed)
[facility] Progress Note  Subjective:  Heidi Wolf is a 75-y/o female who presents to establish care.  Cardiologist: Dr. Percival Spanish GI doctor: [place]  PMH: - Seasonal allergies and takes claritin prn - Arthritis in hip and ankle - T2DM and has been on glyburide 2.5 mg daily - Acid relfex on  - HTN on benazepril 20 mg, amlodipine 5 mg, metoprolol tartrate 25 mg BID; watches salt intake and "walks all day" - HLD on atorvastatin 40 mg  - Intermittent palpitations with PACs seen on holter monitor; beta blocker increased and symptoms improved - CAD s/p DES this July to diagonal branch of LAD; on aspirin 81 EC and initially plavix but this was switched to brilinta with resolution of recurrent chest pain - Cervical disc disease - Glaucoma, uses lumigan drops  Surgical history: - Gallbladder and appendix removal (unknown year) - Tubal ligation, hysterectomy (unknown year) - Stent to LAD 07/03/16  Family History: - Mother: heart disease, diabetes, HTN - Father: early death, stroke - Sister(s): heart disease, diabetes, HLD, HTN - Brother(s): heart disease, diabetes, early death, stroke - M grandmother: diabetes, stroke  Social: - Lives with daughter Heidi Wolf (55) - Does not drink alcohol, never smoker, no illicit drug use  Health Maintenance: - Reports has had colonoscopy (2013) with tubular adenoma of cecal polyp without high grade dysplasia -- repeat colonoscopy recommended in 2018 per Dr. Deatra Ina, mammogram (results "good" in 2012) - Says follows with eye doctor every 3 months  Past Medical History:  Diagnosis Date  . Anemia   . Anxiety   . Arthritis    "right hip" (09/21/2014)  . CAD (coronary artery disease)    PCI 90% diagonal stenosis witha DES July 2017.  Nonobstructive disease elsewhere.   . Cataracts, both eyes   . Colon polyps    Hyperplastic 2003  . External hemorrhoid   . GERD (gastroesophageal reflux disease)   . Glaucoma   . Hyperlipidemia    . Hypertension   . Internal hemorrhoid   . Personal history of colonic polyps 03/03/2012  . Type II diabetes mellitus (HCC)    type 2   ROS: No chest pain, no SOB, no vision changes, no headaches, no recent falls or dizziness   Objective: Blood pressure 128/64, pulse (!) 57, temperature 98.1 F (36.7 C), temperature source Oral, height 5\' 4"  (1.626 m), weight 159 lb (72.1 kg). Body mass index is 27.29 kg/m. Constitutional: Well-appearing female in NAD, very pleasant HENT: NCAT, normal oropharynx Cardiovascular: RRR, S1, S2, no m/r/g.  Pulmonary/Chest: Effort normal and breath sounds normal. No respiratory distress.  Abdominal: Soft. +BS, NT, ND, no rebound or guarding.  Musculoskeletal: No LE edema Neurological: AOx3, no focal deficits. Peripheral sensation intact.  Psychiatric: Normal mood and affect.  Vitals reviewed  Assessment/Plan: 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HCC) - Hgb A1c 5.8 today. Recommended discontinuing glipizide, as patient well below goal of 7.5 in older adult - Has been checking sugars twice daily. Counseled she could decrease to once daily or if she is feeling symptomatic.  - Recheck hgb A1c in 3 months  Essential hypertension - Well controlled below JNC-8 goal of 150/90 on metoprolol tartrate 25 mg BID and benazepril (has CAD) and amlodipine  CAD- S/P PCI July 2017 - On atorvastatin and aspirin and brilinta  Follow-up in about 3 months to f/u blood sugars.  Olene Floss, MD [place], PGY-2

## 2016-09-22 NOTE — Patient Instructions (Addendum)
Ms. Boeh,  It was a pleasure to meet you today.  You received pneumonia and flu shots today.  Please get the TDAP and shingles vaccine at your pharmacy at your convenience.  Please stop taking glipizide. I would like to follow up in about 3 months to see how your sugars are doing. Checking your blood sugars once daily is fine.  Best, Dr. Ola Spurr

## 2016-09-24 NOTE — Assessment & Plan Note (Signed)
-   Well controlled below JNC-8 goal of 150/90 on metoprolol tartrate 25 mg BID and benazepril (has CAD) and amlodipine

## 2016-09-24 NOTE — Assessment & Plan Note (Signed)
-   On atorvastatin and aspirin and brilinta

## 2016-09-24 NOTE — Assessment & Plan Note (Signed)
-   Hgb A1c 5.8 today. Recommended discontinuing glipizide, as patient well below goal of 7.5 in older adult - Has been checking sugars twice daily. Counseled she could decrease to once daily or if she is feeling symptomatic.  - Recheck hgb A1c in 3 months

## 2016-09-25 ENCOUNTER — Telehealth: Payer: Self-pay | Admitting: Cardiology

## 2016-09-25 NOTE — Telephone Encounter (Signed)
New Message  Pt voiced if it's okay for her to go get mammograms.  Please f/u

## 2016-09-25 NOTE — Telephone Encounter (Signed)
Spoke with pt, okay given for pt to have mammogram.

## 2016-10-14 ENCOUNTER — Other Ambulatory Visit: Payer: Self-pay | Admitting: Internal Medicine

## 2016-10-14 DIAGNOSIS — Z1231 Encounter for screening mammogram for malignant neoplasm of breast: Secondary | ICD-10-CM

## 2016-10-23 ENCOUNTER — Ambulatory Visit: Payer: Medicare Other | Admitting: Family Medicine

## 2016-11-10 ENCOUNTER — Other Ambulatory Visit: Payer: Self-pay | Admitting: Internal Medicine

## 2016-11-10 DIAGNOSIS — Z955 Presence of coronary angioplasty implant and graft: Secondary | ICD-10-CM

## 2016-11-11 NOTE — Telephone Encounter (Signed)
Test strips are not listed on current med list.  Derl Barrow, RN

## 2016-11-11 NOTE — Telephone Encounter (Signed)
Pt needs a refill on test strips. Pt uses [facility] on [address]. Please advise. Thanks! ep

## 2016-11-12 MED ORDER — GLUCOSE BLOOD VI STRP: ORAL_STRIP | 12 refills | Status: DC

## 2016-11-13 ENCOUNTER — Telehealth: Payer: Self-pay | Admitting: *Deleted

## 2016-11-13 NOTE — Telephone Encounter (Signed)
Prior Authorization received from [employer] for [employer] test strips. Test strips are on patient's formulary.  Have to be billed under Medicare Part B.  Standardized DM form completed for Accu-Chek Aviva Plus test strips.  Form signed by Dr. Andria Frames.  Form placed up front to be faxed to [facility].  Derl Barrow, RN

## 2016-11-26 ENCOUNTER — Ambulatory Visit
Admission: RE | Admit: 2016-11-26 | Discharge: 2016-11-26 | Disposition: A | Discharge status: Home / Self Care | Payer: Medicare Other | Source: Ambulatory Visit | Attending: Family Medicine | Admitting: Family Medicine

## 2016-11-26 DIAGNOSIS — Z1231 Encounter for screening mammogram for malignant neoplasm of breast: Secondary | ICD-10-CM | POA: Diagnosis not present

## 2016-12-04 DIAGNOSIS — H401131 Primary open-angle glaucoma, bilateral, mild stage: Secondary | ICD-10-CM | POA: Insufficient documentation

## 2016-12-25 ENCOUNTER — Telehealth: Payer: Self-pay

## 2016-12-25 NOTE — Telephone Encounter (Signed)
Error

## 2016-12-26 ENCOUNTER — Encounter: Payer: Self-pay | Admitting: Internal Medicine

## 2016-12-26 ENCOUNTER — Ambulatory Visit (INDEPENDENT_AMBULATORY_CARE_PROVIDER_SITE_OTHER): Payer: Medicare Other | Admitting: Internal Medicine

## 2016-12-26 VITALS — BP 145/85 | HR 53 | Temp 98.1°F | Ht 64.0 in | Wt 161.6 lb

## 2016-12-26 DIAGNOSIS — E1159 Type 2 diabetes mellitus with other circulatory complications: Secondary | ICD-10-CM

## 2016-12-26 DIAGNOSIS — Z9861 Coronary angioplasty status: Secondary | ICD-10-CM | POA: Diagnosis not present

## 2016-12-26 DIAGNOSIS — E118 Type 2 diabetes mellitus with unspecified complications: Secondary | ICD-10-CM | POA: Diagnosis present

## 2016-12-26 DIAGNOSIS — E785 Hyperlipidemia, unspecified: Secondary | ICD-10-CM | POA: Diagnosis not present

## 2016-12-26 DIAGNOSIS — I2510 Atherosclerotic heart disease of native coronary artery without angina pectoris: Secondary | ICD-10-CM

## 2016-12-26 DIAGNOSIS — I10 Essential (primary) hypertension: Secondary | ICD-10-CM

## 2016-12-26 LAB — POCT GLYCOSYLATED HEMOGLOBIN (HGB A1C): Hemoglobin A1C: 6

## 2016-12-26 MED ORDER — ATORVASTATIN CALCIUM 40 MG PO TABS: 40.0000 mg | ORAL_TABLET | Freq: Every evening | ORAL | 3 refills | Status: DC

## 2016-12-26 NOTE — Assessment & Plan Note (Signed)
-   Will order refill of atorvastatin

## 2016-12-26 NOTE — Progress Notes (Signed)
[facility] Progress Note  Subjective:  Heidi Wolf is a 75 y.o. female with HTN, glaucoma, and T2DM who presents for diabetes f/u.   T2DM: - Patient has been checking her blood sugars 1 or 2 times a day. High was 122 and low was 79 over the past 2 months. Most measurements have been in the 90s, low 100s. - Sees ophthalmologist every 3 months for glaucoma - Glipizide 2.5 mg was stopped at last appointment 3 months ago for hgb A1c of 5.8 - Patient continues to try to avoid sweets and eats mostly baked foods. Has seen blood sugar go up after eating certain fruits. ROS: No abdominal pain, no dizziness  HTN: - Taking benazepril 20 mg, amlodipine 5 mg, metoprolol tartrate 25 mg BID - Watches salt intake - To see her Cardiologist early in January  HLD: - Requesting refill of atorvastatin; is not out yet but will need refill in about 2 months  Social: Never smoker  Objective: Blood pressure (!) 150/85, pulse (!) 53, temperature 98.1 F (36.7 C), temperature source Oral, height 5\' 4"  (1.626 m), weight 161 lb 9.6 oz (73.3 kg), SpO2 98 %. Repeat BP 145/85. Body mass index is 27.74 kg/m. Constitutional: Overweight female, in NAD; very pleasant Cardiovascular: RRR, S1, S2, no m/r/g.  Pulmonary/Chest: Effort normal and breath sounds normal. No respiratory distress.  Vitals reviewed  Assessment/Plan: 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HCC) - Hgb A1c 6.0 today off of glipizide. Well-controlled with diet. - Counseled patient she could check CBGs less frequently. She plans to do so every other day or if she feels her sugar is low. - Would restart medication if patient has ophthalmologic changes or higher sugars when she brings her blood sugar log to next appointment  Essential hypertension - BP 145/85. Could consider increasing amlodipine to 10 mg daily if has high readings (> 150/90) at next couple of appointments.  Dyslipidemia - Will order refill of  atorvastatin  Follow-up in about 6 months for DM f/u. Regarding bradycardia, patient to see her Cardiologist next week.   Olene Floss, MD [place], PGY-2

## 2016-12-26 NOTE — Patient Instructions (Signed)
Ms. Zajicek,  Your sugar control looks very good. Continue to stay off the glipizide. You can reduce how often you take your blood sugars.  I will call in a refill for atorvastatin.  Please follow-up in about 6 months.  Best, Dr. Ola Spurr

## 2016-12-26 NOTE — Assessment & Plan Note (Signed)
-   BP 145/85. Could consider increasing amlodipine to 10 mg daily if has high readings (> 150/90) at next couple of appointments.

## 2016-12-26 NOTE — Assessment & Plan Note (Signed)
-   Hgb A1c 6.0 today off of glipizide. Well-controlled with diet. - Counseled patient she could check CBGs less frequently. She plans to do so every other day or if she feels her sugar is low. - Would restart medication if patient has ophthalmologic changes or higher sugars when she brings her blood sugar log to next appointment

## 2016-12-31 NOTE — Progress Notes (Deleted)
Cardiology Office Note   Date:  12/31/2016   ID:  Theophilus Bones, DOB [birth year], MRN [id number]  PCP:  Darci Needle, MD  Cardiologist:   Minus Breeding, MD   No chief complaint on file.     History of Present Illness: Heidi Wolf is a 76 y.o. female who presents for follow up of CAD the patient had unstable anigna and ws found to have a high grade stenosis of the diagonal treated with a DES.  There was non obstructive disease elsewhere.  She had recurrent pain and repeat cath and the stent was found to be patent.  She was in the ED last week with palpitations and mild chest pain and ruled out for MI. Holter following that presentation showed PACs.  ***  She has had continued palpitations.  These happen daily and these are mostly at night.  They are strong isolated beats.  They can keep her awake.  She also has had chest pain.as well.  These are sharp and across her chest.  These are similar to the pain that she had at the time of both caths.  They are sporadic and might occur with her palpitations.  She does not describe associated SOB or nausea.  She has not had any syncope.  There is no PND or orthopnea.  However, she has decreased exercise tolerance and weakness.   Past Medical History:  Diagnosis Date  . Anemia   . Anxiety   . Arthritis    "right hip" (09/21/2014)  . CAD (coronary artery disease)    PCI 90% diagonal stenosis witha DES July 2017.  Nonobstructive disease elsewhere.   . Cataracts, both eyes   . Colon polyps    Hyperplastic 2003  . External hemorrhoid   . GERD (gastroesophageal reflux disease)   . Glaucoma   . Hyperlipidemia   . Hypertension   . Internal hemorrhoid   . Personal history of colonic polyps 03/03/2012  . Type II diabetes mellitus ([place])    type 2    Past Surgical History:  Procedure Laterality Date  . ABDOMINAL HYSTERECTOMY    . APPENDECTOMY     "w/gallbladder"  . CARDIAC CATHETERIZATION  X 2  . CARDIAC CATHETERIZATION N/A 07/03/2016     Procedure: Left Heart Cath and Coronary Angiography;  Surgeon: Dixie Dials, MD;  Location: [facility];  Service: Cardiovascular;  Laterality: N/A;  . CARDIAC CATHETERIZATION N/A 07/03/2016   Procedure: Coronary Stent Intervention;  Surgeon: Jettie Booze, MD;  Location: [place] [facility];  Service: Cardiovascular;  Laterality: N/A;  . CARDIAC CATHETERIZATION N/A 07/15/2016   Procedure: Left Heart Cath and Coronary Angiography;  Surgeon: Dixie Dials, MD;  Location: [place] [facility];  Service: Cardiovascular;  Laterality: N/A;  . CHOLECYSTECTOMY    . COLONOSCOPY    . TUBAL LIGATION  1971     Current Outpatient Prescriptions  Medication Sig Dispense Refill  . acetaminophen (TYLENOL) 325 MG tablet Take 650 mg by mouth every 6 (six) hours as needed for headache.    Marland Kitchen amLODipine (NORVASC) 5 MG tablet Take 1 tablet (5 mg total) by mouth daily. 30 tablet 12  . aspirin EC 81 MG tablet Take 1 tablet (81 mg total) by mouth daily.    Marland Kitchen atorvastatin (LIPITOR) 40 MG tablet Take 1 tablet (40 mg total) by mouth every evening. 90 tablet 3  . benazepril (LOTENSIN) 20 MG tablet Take 1 tablet (20 mg total) by mouth daily. [place]  tablet 3  . BRILINTA 90 MG TABS tablet TAKE 1 TABLET(90 MG) BY MOUTH TWICE DAILY 180 tablet 0  . cholecalciferol (VITAMIN D) 1000 UNITS tablet Take 1,000 Units by mouth every other day.     Marland Kitchen glucose blood (ACCU-CHEK AVIVA PLUS) test strip Use as instructed 100 each 12  . loperamide (IMODIUM) 2 MG capsule Take 4 mg by mouth as needed for diarrhea or loose stools.    Marland Kitchen loratadine (CLARITIN) 10 MG tablet Take 10 mg by mouth daily as needed for allergies.     Marland Kitchen LUMIGAN 0.01 % SOLN Place 1 drop into both eyes at bedtime.     . metoprolol tartrate (LOPRESSOR) 25 MG tablet Take 1 tablet (25 mg total) by mouth 2 (two) times daily. 180 tablet 3  . nitroGLYCERIN (NITROSTAT) 0.4 MG SL tablet Place 1 tablet (0.4 mg total) under the tongue every 5 (five) minutes as needed for chest  pain. 25 tablet 12  . ondansetron (ZOFRAN) 4 MG tablet Take 1 tablet (4 mg total) by mouth every 8 (eight) hours as needed for nausea or vomiting. 8 tablet 0  . pantoprazole (PROTONIX) 40 MG tablet Take 1 tablet (40 mg total) by mouth daily. 90 tablet 3  . Potassium Chloride ER 20 MEQ TBCR Take 20 mEq by mouth daily. 90 tablet 3   No current facility-administered medications for this visit.     Allergies:   Aspirin    ROS:  Please see the history of present illness.   Otherwise, review of systems are positive for ***   All other systems are reviewed and negative.    PHYSICAL EXAM: VS:  There were no vitals taken for this visit. , BMI There is no height or weight on file to calculate BMI. GENERAL:  Well appearing HEENT:  Pupils equal round and reactive, fundi not visualized, oral mucosa unremarkable NECK:  No jugular venous distention, waveform within normal limits, carotid upstroke brisk and symmetric, no bruits, no thyromegaly LYMPHATICS:  No cervical, inguinal adenopathy LUNGS:  Clear to auscultation bilaterally BACK:  No CVA tenderness CHEST:  Unremarkable HEART:  PMI not displaced or sustained,S1 and S2 within normal limits, no S3, no S4, no clicks, no rubs, soft systolic murmur at the apex, no diastolic murmurs ABD:  Flat, positive bowel sounds normal in frequency in pitch, no bruits, no rebound, no guarding, no midline pulsatile mass, no hepatomegaly, no splenomegaly EXT:  2 plus pulses throughout, no edema, no cyanosis no clubbing SKIN:  No rashes no nodules NEURO:  Cranial nerves II through XII grossly intact, motor grossly intact throughout PSYCH:  Cognitively intact, oriented to person place and time    EKG:  EKG is  *** ordered today. The ekg ordered today demonstrates sinus rhythm with premature atrial contractions, rate ***, axis within normal limits, left ventricular cavity with repolarization changes and dizziness.   Recent Labs: 07/14/2016: ALT 17 08/06/2016:  Hemoglobin 13.9; Platelets 276 08/11/2016: BUN 16; Creat 1.07; Potassium 4.4; Sodium 139; TSH 0.70    Lipid Panel    Component Value Date/Time   CHOL 108 07/15/2016 0111   TRIG 101 07/15/2016 0111   HDL 41 07/15/2016 0111   CHOLHDL 2.6 07/15/2016 0111   VLDL 20 07/15/2016 0111   LDLCALC 47 07/15/2016 0111      Wt Readings from Last 3 Encounters:  12/26/16 161 lb 9.6 oz (73.3 kg)  09/22/16 159 lb (72.1 kg)  09/03/16 156 lb (70.8 kg)      Other  studies Reviewed: Additional studies/ records that were reviewed today include: *** Review of the above records demonstrates:  ***   ASSESSMENT AND PLAN:  CAD:  *** She did not tolerate Imdur before because of headaches. I am going to manage her medically in the context of treating her palpitations.  PALPITATIONS:  She does have tachycardia palpitations as described.  I will apply a Holter.  I will increase her beta blocker to bid.  We will continue to titrate meds although we will be limited by low HR.   I will check a TSH and BMET.   HTN:   This is being managed in the context of treating her palpitatoins.    DM:   We will try to help her find a PCP.  HYPERLIPIDEMIA:  She will continue current meds.    Current medicines are reviewed at length with the patient today.  The patient does not have concerns regarding medicines.  The following changes have been made:  no change  Labs/ tests ordered today include:  ***  No orders of the defined types were placed in this encounter.    Disposition:   FU with ***    Signed, Minus Breeding, MD  12/31/2016 9:12 PM    [place]  [facility]

## 2017-01-01 ENCOUNTER — Ambulatory Visit: Payer: Medicare Other | Admitting: Cardiology

## 2017-01-09 ENCOUNTER — Other Ambulatory Visit: Payer: Self-pay | Admitting: Internal Medicine

## 2017-01-09 DIAGNOSIS — Z955 Presence of coronary angioplasty implant and graft: Secondary | ICD-10-CM

## 2017-01-12 ENCOUNTER — Encounter: Payer: Self-pay | Admitting: Gastroenterology

## 2017-01-16 ENCOUNTER — Ambulatory Visit: Payer: Medicare Other | Admitting: Cardiology

## 2017-02-08 NOTE — Progress Notes (Signed)
Cardiology Office Note   Date:  02/09/2017   ID:  Theophilus Bones, DOB 1941-10-25, MRN [id number]  PCP:  Darci Needle, MD  Cardiologist:   Minus Breeding, MD   Chief Complaint  Patient presents with  . Coronary Artery Disease      History of Present Illness: Heidi Wolf is a 76 y.o. female who presents for follow up of CAD the patient had unstable anigna and ws found to have a high grade stenosis of the diagonal treated with a DES.  There was non obstructive disease elsewhere.  She had recurrent pain and repeat cath and the stent was found to be patent.  At that visit I did order a monitor which demonstrated PACs.   Since I last saw her she saw [facility] in Sept.   She was doing relatively well.  She was encouraged to see her PCP for treatment of her DM.   She has a new PCP and is doing well with her DM.   She does have some right shoulder discomfort. However, this is unlikely her previous angina. She has some right shoulder discomfort that hurts with movement. It's mild. She thinks it's similar to previous arthritis. She hasn't taken any nitroglycerin since I saw her. She denies any palpitations, presyncope or syncope. She's had no shortness of breath, PND or orthopnea.   Past Medical History:  Diagnosis Date  . Anemia   . Anxiety   . Arthritis    "right hip" (09/21/2014)  . CAD (coronary artery disease)    PCI 90% diagonal stenosis witha DES July 2017.  Nonobstructive disease elsewhere.   . Cataracts, both eyes   . Colon polyps    Hyperplastic 2003  . External hemorrhoid   . GERD (gastroesophageal reflux disease)   . Glaucoma   . Hyperlipidemia   . Hypertension   . Internal hemorrhoid   . Personal history of colonic polyps 03/03/2012  . Type II diabetes mellitus ([place])    type 2    Past Surgical History:  Procedure Laterality Date  . ABDOMINAL HYSTERECTOMY    . APPENDECTOMY     "w/gallbladder"  . CARDIAC CATHETERIZATION  X 2  . CARDIAC CATHETERIZATION N/A  07/03/2016   Procedure: Left Heart Cath and Coronary Angiography;  Surgeon: Dixie Dials, MD;  Location: [facility] [facility];  Service: Cardiovascular;  Laterality: N/A;  . CARDIAC CATHETERIZATION N/A 07/03/2016   Procedure: Coronary Stent Intervention;  Surgeon: Jettie Booze, MD;  Location: [facility];  Service: Cardiovascular;  Laterality: N/A;  . CARDIAC CATHETERIZATION N/A 07/15/2016   Procedure: Left Heart Cath and Coronary Angiography;  Surgeon: Dixie Dials, MD;  Location: [facility];  Service: Cardiovascular;  Laterality: N/A;  . CHOLECYSTECTOMY    . COLONOSCOPY    . TUBAL LIGATION  1971     Current Outpatient Prescriptions  Medication Sig Dispense Refill  . acetaminophen (TYLENOL) 325 MG tablet Take 650 mg by mouth every 6 (six) hours as needed for headache.    Marland Kitchen amLODipine (NORVASC) 5 MG tablet Take 1 tablet (5 mg total) by mouth daily. 30 tablet 12  . aspirin EC 81 MG tablet Take 1 tablet (81 mg total) by mouth daily.    Marland Kitchen atorvastatin (LIPITOR) 40 MG tablet Take 1 tablet (40 mg total) by mouth every evening. 90 tablet 3  . benazepril (LOTENSIN) 20 MG tablet Take 1 tablet (20 mg total) by mouth daily. 90 tablet 3  .  cholecalciferol (VITAMIN D) 1000 UNITS tablet Take 1,000 Units by mouth every other day.     Marland Kitchen glucose blood (ACCU-CHEK AVIVA PLUS) test strip Use as instructed 100 each 12  . loperamide (IMODIUM) 2 MG capsule Take 4 mg by mouth as needed for diarrhea or loose stools.    Marland Kitchen loratadine (CLARITIN) 10 MG tablet Take 10 mg by mouth daily as needed for allergies.     Marland Kitchen LUMIGAN 0.01 % SOLN Place 1 drop into both eyes at bedtime.     . metoprolol tartrate (LOPRESSOR) 25 MG tablet Take 1 tablet (25 mg total) by mouth 2 (two) times daily. 180 tablet 3  . nitroGLYCERIN (NITROSTAT) 0.4 MG SL tablet Place 1 tablet (0.4 mg total) under the tongue every 5 (five) minutes as needed for chest pain. 25 tablet 12  . ondansetron (ZOFRAN) 4 MG tablet Take 1 tablet (4 mg  total) by mouth every 8 (eight) hours as needed for nausea or vomiting. 8 tablet 0  . pantoprazole (PROTONIX) 40 MG tablet Take 1 tablet (40 mg total) by mouth daily. 90 tablet 3  . Potassium Chloride ER 20 MEQ TBCR Take 20 mEq by mouth daily. 90 tablet 3  . ticagrelor (BRILINTA) 90 MG TABS tablet Take 1 tablet (90 mg total) by mouth 2 (two) times daily. 60 tablet 11   No current facility-administered medications for this visit.     Allergies:   Aspirin   ROS:  Please see the history of present illness.   Otherwise, review of systems are positive none.   All other systems are reviewed and negative.    PHYSICAL EXAM: VS:  BP 136/80   Pulse (!) 52   Ht 5\' 4"  (1.626 m)   Wt 163 lb (73.9 kg)   BMI 27.98 kg/m  , BMI Body mass index is 27.98 kg/m. GENERAL:  Well appearing NECK:  No jugular venous distention, waveform within normal limits, carotid upstroke brisk and symmetric, no bruits, no thyromegaly LUNGS:  Clear to auscultation bilaterally BACK:  No CVA tenderness CHEST:  Unremarkable HEART:  PMI not displaced or sustained,S1 and S2 within normal limits, no S3, no S4, no clicks, no rubs, soft systolic murmur at the apex, no diastolic murmurs ABD:  Flat, positive bowel sounds normal in frequency in pitch, no bruits, no rebound, no guarding, no midline pulsatile mass, no hepatomegaly, no splenomegaly EXT:  2 plus pulses throughout, no edema, no cyanosis no clubbing   EKG:  EKG is not ordered today.   Recent Labs: 07/14/2016: ALT 17 08/06/2016: Hemoglobin 13.9; Platelets 276 08/11/2016: BUN 16; Creat 1.07; Potassium 4.4; Sodium 139; TSH 0.70    Lipid Panel    Component Value Date/Time   CHOL 108 07/15/2016 0111   TRIG 101 07/15/2016 0111   HDL 41 07/15/2016 0111   CHOLHDL 2.6 07/15/2016 0111   VLDL 20 07/15/2016 0111   LDLCALC 47 07/15/2016 0111      Wt Readings from Last 3 Encounters:  02/09/17 163 lb (73.9 kg)  12/26/16 161 lb 9.6 oz (73.3 kg)  09/22/16 159 lb (72.1 kg)       Other studies Reviewed: Additional studies/ records that were reviewed today include: None Review of the above records demonstrates:     ASSESSMENT AND PLAN:  CAD:  Is not clear that she's having angina as her vessels were patent. She did not tolerate Imdur before because of headaches. I am going to manage her medically.  No change in therapy is planned.  She will continue Brilinta until at least July.  PALPITATIONS:   She does have tachycardia palpitation.  I will make no change to her meds.   HTN:   This is being managed in the context of treating her palpitatoins.    DM:   This is being managed by her PCP.  Lab Results  Component Value Date   HGBA1C 6.0 12/26/2016    HYPERLIPIDEMIA:  She will continue current meds.    Current medicines are reviewed at length with the patient today.  The patient does not have concerns regarding medicines.  The following changes have been made:   None  Labs/ tests ordered today include:  None  No orders of the defined types were placed in this encounter.    Disposition:   FU with me in July   Signed, Zyir Gassert, MD  02/09/2017 9:55 PM    [facility]

## 2017-02-09 ENCOUNTER — Encounter: Payer: Self-pay | Admitting: Cardiology

## 2017-02-09 ENCOUNTER — Ambulatory Visit (INDEPENDENT_AMBULATORY_CARE_PROVIDER_SITE_OTHER): Payer: Medicare Other | Admitting: Cardiology

## 2017-02-09 VITALS — BP 136/80 | HR 52 | Ht 64.0 in | Wt 163.0 lb

## 2017-02-09 DIAGNOSIS — R002 Palpitations: Secondary | ICD-10-CM

## 2017-02-09 DIAGNOSIS — Z9861 Coronary angioplasty status: Secondary | ICD-10-CM | POA: Diagnosis not present

## 2017-02-09 DIAGNOSIS — Z955 Presence of coronary angioplasty implant and graft: Secondary | ICD-10-CM

## 2017-02-09 MED ORDER — TICAGRELOR 90 MG PO TABS: 90.0000 mg | ORAL_TABLET | Freq: Two times a day (BID) | ORAL | 11 refills | Status: DC

## 2017-02-09 NOTE — Patient Instructions (Signed)
Medication Instructions:  Continue current medications  Labwork: None Ordered  Testing/Procedures: None Ordered  Follow-Up: Your physician recommends that you schedule a follow-up appointment in: July 2018   Any Other Special Instructions Will Be Listed Below (If Applicable).   If you need a refill on your cardiac medications before your next appointment, please call your pharmacy.

## 2017-03-05 DIAGNOSIS — H401131 Primary open-angle glaucoma, bilateral, mild stage: Secondary | ICD-10-CM | POA: Diagnosis not present

## 2017-03-17 ENCOUNTER — Ambulatory Visit (INDEPENDENT_AMBULATORY_CARE_PROVIDER_SITE_OTHER): Payer: Medicare Other | Admitting: Internal Medicine

## 2017-03-17 ENCOUNTER — Encounter: Payer: Self-pay | Admitting: Internal Medicine

## 2017-03-17 VITALS — BP 138/78 | HR 65 | Temp 99.2°F | Wt 165.0 lb

## 2017-03-17 DIAGNOSIS — R109 Unspecified abdominal pain: Secondary | ICD-10-CM | POA: Diagnosis present

## 2017-03-17 DIAGNOSIS — E1159 Type 2 diabetes mellitus with other circulatory complications: Secondary | ICD-10-CM

## 2017-03-17 NOTE — Patient Instructions (Addendum)
Heidi Wolf,  I suspect you have strained a back muscle. This can take 1-2 weeks to start feeling better. You can take two 325 mg tylenol up to 3 times a day. A heating pad may help as well. Try to remain active and do light back exercises to not become stiff.  If you don't have any improvement by next week, please call and let me know.  I would like to see you back in May to get labs.  Best, Dr. Ola Spurr   Back Exercises If you have pain in your back, do these exercises 2-3 times each day or as told by your doctor. When the pain goes away, do the exercises once each day, but repeat the steps more times for each exercise (do more repetitions). If you do not have pain in your back, do these exercises once each day or as told by your doctor. Exercises Single Knee to Chest   Do these steps 3-5 times in a row for each leg: 1. Lie on your back on a firm bed or the floor with your legs stretched out. 2. Bring one knee to your chest. 3. Hold your knee to your chest by grabbing your knee or thigh. 4. Pull on your knee until you feel a gentle stretch in your lower back. 5. Keep doing the stretch for 10-30 seconds. 6. Slowly let go of your leg and straighten it. Pelvic Tilt   Do these steps 5-10 times in a row: 1. Lie on your back on a firm bed or the floor with your legs stretched out. 2. Bend your knees so they point up to the ceiling. Your feet should be flat on the floor. 3. Tighten your lower belly (abdomen) muscles to press your lower back against the floor. This will make your tailbone point up to the ceiling instead of pointing down to your feet or the floor. 4. Stay in this position for 5-10 seconds while you gently tighten your muscles and breathe evenly. Cat-Cow   Do these steps until your lower back bends more easily: 1. Get on your hands and knees on a firm surface. Keep your hands under your shoulders, and keep your knees under your hips. You may put padding under your  knees. 2. Let your head hang down, and make your tailbone point down to the floor so your lower back is round like the back of a cat. 3. Stay in this position for 5 seconds. 4. Slowly lift your head and make your tailbone point up to the ceiling so your back hangs low (sags) like the back of a cow. 5. Stay in this position for 5 seconds. Press-Ups   Do these steps 5-10 times in a row: 1. Lie on your belly (face-down) on the floor. 2. Place your hands near your head, about shoulder-width apart. 3. While you keep your back relaxed and keep your hips on the floor, slowly straighten your arms to raise the top half of your body and lift your shoulders. Do not use your back muscles. To make yourself more comfortable, you may change where you place your hands. 4. Stay in this position for 5 seconds. 5. Slowly return to lying flat on the floor. Bridges   Do these steps 10 times in a row: 1. Lie on your back on a firm surface. 2. Bend your knees so they point up to the ceiling. Your feet should be flat on the floor. 3. Tighten your butt muscles and lift your butt  off of the floor until your waist is almost as high as your knees. If you do not feel the muscles working in your butt and the back of your thighs, slide your feet 1-2 inches farther away from your butt. 4. Stay in this position for 3-5 seconds. 5. Slowly lower your butt to the floor, and let your butt muscles relax. If this exercise is too easy, try doing it with your arms crossed over your chest. Belly Crunches   Do these steps 5-10 times in a row: 1. Lie on your back on a firm bed or the floor with your legs stretched out. 2. Bend your knees so they point up to the ceiling. Your feet should be flat on the floor. 3. Cross your arms over your chest. 4. Tip your chin a little bit toward your chest but do not bend your neck. 5. Tighten your belly muscles and slowly raise your chest just enough to lift your shoulder blades a tiny bit off  of the floor. 6. Slowly lower your chest and your head to the floor. Back Lifts  Do these steps 5-10 times in a row: 1. Lie on your belly (face-down) with your arms at your sides, and rest your forehead on the floor. 2. Tighten the muscles in your legs and your butt. 3. Slowly lift your chest off of the floor while you keep your hips on the floor. Keep the back of your head in line with the curve in your back. Look at the floor while you do this. 4. Stay in this position for 3-5 seconds. 5. Slowly lower your chest and your face to the floor. Contact a doctor if:  Your back pain gets a lot worse when you do an exercise.  Your back pain does not lessen 2 hours after you exercise. If you have any of these problems, stop doing the exercises. Do not do them again unless your doctor says it is okay. Get help right away if:  You have sudden, very bad back pain. If this happens, stop doing the exercises. Do not do them again unless your doctor says it is okay. This information is not intended to replace advice given to you by your health care provider. Make sure you discuss any questions you have with your health care provider. Document Released: 01/17/2011 Document Revised: 05/22/2016 Document Reviewed: 02/08/2015 Elsevier Interactive Patient Education  2017 [employer].

## 2017-03-17 NOTE — Progress Notes (Signed)
[facility] Progress Note  Subjective:  Heidi Wolf is a 76 y.o. female with history of T2DM, CAD s/p PCI 2017, HTN, GERD, and arthritis of R hip who presents for complaint of new R back and abdominal pain.  #Right back/lower abdominal pain: - Began last Friday night when she was in the kitchen. Describes as sharp across right lower abdomen and sore in her right lower back. Is not present all the time. Notices with bending down and turning, as well as when she lays on her right side at night. - Has tried tylenol 325 mg once a day with some improvement - Denies any falls, injuries, or increased activity - Denies dysuria, change in vaginal discharge, fevers, chills, n/v - No bowel or bladder incontinence. No radiation of pain beyond back and R lower abdomen. No numbness or tingling pain. - No rashes and no history of shingles (has not gotten vaccine, as pharmacy has been out) - Denies constipation. Has daily soft BMs.  - Denies chest pain and has rare sensation of palpitations.   Objective: Blood pressure 138/78, pulse 65, temperature 99.2 F (37.3 C), temperature source Oral, weight 165 lb (74.8 kg), SpO2 98 %. Body mass index is 28.32 kg/m. Constitutional: Overweight female, in NAD Cardiovascular: RRR, S1, S2, no m/r/g.  Pulmonary/Chest: Effort normal and breath sounds normal. No respiratory distress.  Abdominal: Soft. +BS, mildly TTP over RLQ (closer to iliac crest) but no worsening or guarding with deep palpation, ND Musculoskeletal: Minimal TTP over R lumbar paraspinal muscles. No TTP over R greater trochanter. Strength intact with straight leg raise with no increased discomfort bilaterally. No worsening of pain with internal or external rotation of the hip bilaterally. No midline spinal tenderness.  Neurological: AOx3, no focal deficits. No decreased sensation over R lower back or lower abdomen where patient endorses pain. No CVA tenderness.  Skin: Skin is warm and  dry. No rash over areas of pain.   Psychiatric: Normal mood and affect.  Vitals reviewed  Assessment/Plan: Flank pain - Suspect MSK pain given worsening with certain movements (twisting), some reproducibility on exam, and improvement with tylenol. Patient without fevers or persistent pain, suggesting against infection. No rash to suggest shingles but told patient to continue to check her skin. Reassured against spinal etiology since no radiation down leg, numbness, or midline spinal tenderness.  - Recommended tylenol prn TID, trying a heating pad, and doing light stretching exercises to avoid becoming stiff - Advised patient would expect pain to improve over the next week but to call if symptoms are worsening or not getting better by then   Follow-up in May to check labs (A1c, BMP).  Olene Floss, MD [place], PGY-2

## 2017-03-18 DIAGNOSIS — R109 Unspecified abdominal pain: Secondary | ICD-10-CM | POA: Insufficient documentation

## 2017-03-18 NOTE — Assessment & Plan Note (Addendum)
-   Suspect MSK pain given worsening with certain movements (twisting), some reproducibility on exam, and improvement with tylenol. Patient without fevers or persistent pain, suggesting against infection. No rash to suggest shingles but told patient to continue to check her skin. Reassured against spinal etiology since no radiation down leg, numbness, or midline spinal tenderness.  - Recommended tylenol prn TID, trying a heating pad, and doing light stretching exercises to avoid becoming stiff - Advised patient would expect pain to improve over the next week but to call if symptoms are worsening or not getting better by then

## 2017-03-21 ENCOUNTER — Encounter (HOSPITAL_COMMUNITY): Payer: Self-pay

## 2017-03-21 ENCOUNTER — Emergency Department (HOSPITAL_COMMUNITY): Payer: Medicare Other

## 2017-03-21 ENCOUNTER — Emergency Department (HOSPITAL_COMMUNITY)
Admission: EM | Admit: 2017-03-21 | Discharge: 2017-03-21 | Disposition: A | Discharge status: Home / Self Care | Payer: Medicare Other | Attending: Emergency Medicine | Admitting: Emergency Medicine

## 2017-03-21 ENCOUNTER — Other Ambulatory Visit: Payer: Self-pay

## 2017-03-21 DIAGNOSIS — Z7982 Long term (current) use of aspirin: Secondary | ICD-10-CM | POA: Diagnosis not present

## 2017-03-21 DIAGNOSIS — R109 Unspecified abdominal pain: Secondary | ICD-10-CM | POA: Diagnosis not present

## 2017-03-21 DIAGNOSIS — Z8673 Personal history of transient ischemic attack (TIA), and cerebral infarction without residual deficits: Secondary | ICD-10-CM | POA: Diagnosis not present

## 2017-03-21 DIAGNOSIS — R1011 Right upper quadrant pain: Secondary | ICD-10-CM | POA: Insufficient documentation

## 2017-03-21 DIAGNOSIS — E1159 Type 2 diabetes mellitus with other circulatory complications: Secondary | ICD-10-CM | POA: Insufficient documentation

## 2017-03-21 DIAGNOSIS — R911 Solitary pulmonary nodule: Secondary | ICD-10-CM | POA: Insufficient documentation

## 2017-03-21 DIAGNOSIS — I10 Essential (primary) hypertension: Secondary | ICD-10-CM | POA: Insufficient documentation

## 2017-03-21 DIAGNOSIS — I2510 Atherosclerotic heart disease of native coronary artery without angina pectoris: Secondary | ICD-10-CM | POA: Insufficient documentation

## 2017-03-21 DIAGNOSIS — Z7901 Long term (current) use of anticoagulants: Secondary | ICD-10-CM | POA: Insufficient documentation

## 2017-03-21 LAB — COMPREHENSIVE METABOLIC PANEL
ALT: 17 U/L (ref 14–54)
AST: 17 U/L (ref 15–41)
Albumin: 3.8 g/dL (ref 3.5–5.0)
Alkaline Phosphatase: 63 U/L (ref 38–126)
Anion gap: 8 (ref 5–15)
BUN: 16 mg/dL (ref 6–20)
CO2: 25 mmol/L (ref 22–32)
Calcium: 9.5 mg/dL (ref 8.9–10.3)
Chloride: 107 mmol/L (ref 101–111)
Creatinine, Ser: 1.2 mg/dL — ABNORMAL HIGH (ref 0.44–1.00)
GFR calc Af Amer: 50 mL/min — ABNORMAL LOW (ref >60)
GFR calc non Af Amer: 43 mL/min — ABNORMAL LOW (ref >60)
Glucose, Bld: 112 mg/dL — ABNORMAL HIGH (ref 65–99)
Potassium: 4 mmol/L (ref 3.5–5.1)
Sodium: 140 mmol/L (ref 135–145)
Total Bilirubin: 0.8 mg/dL (ref 0.3–1.2)
Total Protein: 7.2 g/dL (ref 6.5–8.1)

## 2017-03-21 LAB — CBC WITH DIFFERENTIAL/PLATELET
Basophils Absolute: 0 10*3/uL (ref 0.0–0.1)
Basophils Relative: 0 %
Eosinophils Absolute: 0.1 10*3/uL (ref 0.0–0.7)
Eosinophils Relative: 1 %
HCT: 39.2 % (ref 36.0–46.0)
Hemoglobin: 13 g/dL (ref 12.0–15.0)
Lymphocytes Relative: 30 %
Lymphs Abs: 2.4 10*3/uL (ref 0.7–4.0)
MCH: 27 pg (ref 26.0–34.0)
MCHC: 33.2 g/dL (ref 30.0–36.0)
MCV: 81.5 fL (ref 78.0–100.0)
Monocytes Absolute: 0.5 10*3/uL (ref 0.1–1.0)
Monocytes Relative: 6 %
Neutro Abs: 5.1 10*3/uL (ref 1.7–7.7)
Neutrophils Relative %: 63 %
Platelets: 258 10*3/uL (ref 150–400)
RBC: 4.81 MIL/uL (ref 3.87–5.11)
RDW: 14.2 % (ref 11.5–15.5)
WBC: 8 10*3/uL (ref 4.0–10.5)

## 2017-03-21 LAB — URINALYSIS, ROUTINE W REFLEX MICROSCOPIC
Bacteria, UA: NONE SEEN
Bilirubin Urine: NEGATIVE
Glucose, UA: NEGATIVE mg/dL
Hgb urine dipstick: NEGATIVE
Ketones, ur: NEGATIVE mg/dL
Nitrite: NEGATIVE
Protein, ur: NEGATIVE mg/dL
RBC / HPF: NONE SEEN RBC/hpf (ref 0–5)
Specific Gravity, Urine: 1.015 (ref 1.005–1.030)
pH: 5 (ref 5.0–8.0)

## 2017-03-21 LAB — I-STAT TROPONIN, ED: Troponin i, poc: 0.01 ng/mL (ref 0.00–0.08)

## 2017-03-21 NOTE — Discharge Instructions (Signed)
Please read and follow all provided instructions.  Your diagnoses today include:  1. Flank pain    Tests performed today include:  Blood counts and electrolytes  Blood tests to check liver and kidney function  Blood tests to check pancreas function  Urine test to look for infection  CT scan - shows no severe problems, you have a small lung nodule that your doctor should recheck in 6 - 12 months.  Vital signs. See below for your results today.   Medications prescribed:   None  Take any prescribed medications only as directed.  Home care instructions:   Follow any educational materials contained in this packet.  Follow-up instructions: Please follow-up with your primary care provider in the next 2 days for further evaluation of your symptoms.    Return instructions:  SEEK IMMEDIATE MEDICAL ATTENTION IF:  The pain does not go away or becomes severe   A temperature above 101F develops   Repeated vomiting occurs (multiple episodes)   The pain becomes localized to portions of the abdomen. The right side could possibly be appendicitis. In an adult, the left lower portion of the abdomen could be colitis or diverticulitis.   Blood is being passed in stools or vomit (bright red or black tarry stools)   You develop chest pain, difficulty breathing, dizziness or fainting, or become confused, poorly responsive, or inconsolable (young children)  If you have any other emergent concerns regarding your health  Additional Information: Abdominal (belly) pain can be caused by many things. Your caregiver performed an examination and possibly ordered blood/urine tests and imaging (CT scan, x-rays, ultrasound). Many cases can be observed and treated at home after initial evaluation in the emergency department. Even though you are being discharged home, abdominal pain can be unpredictable. Therefore, you need a repeated exam if your pain does not resolve, returns, or worsens. Most patients  with abdominal pain don't have to be admitted to the hospital or have surgery, but serious problems like appendicitis and gallbladder attacks can start out as nonspecific pain. Many abdominal conditions cannot be diagnosed in one visit, so follow-up evaluations are very important.  Your vital signs today were: BP (!) 148/75    Pulse (!) 55    Temp 98.5 F (36.9 C) (Oral)    Resp 15    SpO2 100%  If your blood pressure (bp) was elevated above 135/85 this visit, please have this repeated by your doctor within one month. --------------

## 2017-03-21 NOTE — ED Notes (Signed)
Patient to CT.

## 2017-03-21 NOTE — ED Triage Notes (Signed)
Pt arrived via POV c/o right flank pain starting last Friday, has seen PCP since then diagnosed with a strained muscle.

## 2017-03-21 NOTE — ED Notes (Signed)
Pt ambulatory to restroom

## 2017-03-21 NOTE — ED Provider Notes (Signed)
[facility] DEPT Provider Note   CSN: [id number] Arrival date & time: 03/21/17  0603     History   Chief Complaint Chief Complaint  Patient presents with  . Flank Pain    HPI Heidi Wolf is a 76 y.o. female.  Patient with history of cholecystectomy, hysterectomy, CAD with stenting 1 compliant with Brilinta --  presents with complaint of ongoing right flank pain for the past 1 week. Patient saw her primary care physician last week and was diagnosed with likely muscle strain given positional and worsening nature of pain with movement. Plan was to follow-up if symptoms do not improve. Last night the pain was worse. She had associated mild chest pressure which she reports is nothing as severe as she had when she needed a stent. Patient had trouble getting up from bed due to pain with movement. She had no associated fevers, nausea, vomiting, diarrhea. No blood in urine or stool. No history of kidney stones. No history of aortic problems. No treatments prior to arrival. The onset of this condition was acute. The course is constant. Aggravating factors: none. Alleviating factors: none.        Past Medical History:  Diagnosis Date  . Anemia   . Anxiety   . Arthritis    "right hip" (09/21/2014)  . CAD (coronary artery disease)    PCI 90% diagonal stenosis witha DES July 2017.  Nonobstructive disease elsewhere.   . Cataracts, both eyes   . Colon polyps    Hyperplastic 2003  . External hemorrhoid   . GERD (gastroesophageal reflux disease)   . Glaucoma   . Hyperlipidemia   . Hypertension   . Internal hemorrhoid   . Personal history of colonic polyps 03/03/2012  . Type II diabetes mellitus ([place])    type 2    Patient Active Problem List   Diagnosis Date Noted  . Flank pain 03/18/2017  . Essential hypertension 09/03/2016  . Dyslipidemia 09/03/2016  . 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[place]) 09/03/2016  . Palpitations 09/03/2016  . Chest pain 07/03/2016  .  CAD- S/P PCI July 2017 07/02/2016  . 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 Chest pain at rest 01/17/2014  . Personal history of colonic polyps 03/03/2012  . Internal hemorrhoids without mention of complication [id number]/[id number]    Past Surgical History:  Procedure Laterality Date  . ABDOMINAL HYSTERECTOMY    . APPENDECTOMY     "w/gallbladder"  . CARDIAC CATHETERIZATION  X 2  . CARDIAC CATHETERIZATION N/A 07/03/2016   Procedure: Left Heart Cath and Coronary Angiography;  Surgeon: Dixie Dials, MD;  Location: [facility];  Service: Cardiovascular;  Laterality: N/A;  . CARDIAC CATHETERIZATION N/A 07/03/2016   Procedure: Coronary Stent Intervention;  Surgeon: Jettie Booze, MD;  Location: [facility];  Service: Cardiovascular;  Laterality: N/A;  . CARDIAC CATHETERIZATION N/A 07/15/2016   Procedure: Left Heart Cath and Coronary Angiography;  Surgeon: Dixie Dials, MD;  Location: [facility];  Service: Cardiovascular;  Laterality: N/A;  . CHOLECYSTECTOMY    . COLONOSCOPY    . TUBAL LIGATION  1971    OB History    No data available       Home Medications    Prior to Admission medications   Medication Sig Start Date End Date Taking? Authorizing Provider  acetaminophen (TYLENOL) 325 MG tablet Take 650 mg by mouth every 6 (six) hours as needed for headache.    Historical  Provider, MD  amLODipine (NORVASC) 5 MG tablet Take 1 tablet (5 mg total) by mouth daily. 08/13/16   Minus Breeding, MD  aspirin EC 81 MG tablet Take 1 tablet (81 mg total) by mouth daily. 07/04/16   Dixie Dials, MD  atorvastatin (LIPITOR) 40 MG tablet Take 1 tablet (40 mg total) by mouth every evening. 12/26/16   Hillary Corinda Gubler, MD  benazepril (LOTENSIN) 20 MG tablet Take 1 tablet (20 mg total) by mouth daily. 08/13/16   Minus Breeding, MD  cholecalciferol (VITAMIN D) 1000 UNITS tablet Take 1,000 Units by mouth every other day.     Historical Provider, MD  glucose blood  (ACCU-CHEK AVIVA PLUS) test strip Use as instructed 11/12/16   Rogue Bussing, MD  loperamide (IMODIUM) 2 MG capsule Take 4 mg by mouth as needed for diarrhea or loose stools.    Historical Provider, MD  loratadine (CLARITIN) 10 MG tablet Take 10 mg by mouth daily as needed for allergies.     Historical Provider, MD  LUMIGAN 0.01 % SOLN Place 1 drop into both eyes at bedtime.  04/13/14   Historical Provider, MD  metoprolol tartrate (LOPRESSOR) 25 MG tablet Take 1 tablet (25 mg total) by mouth 2 (two) times daily. 08/11/16   Minus Breeding, MD  nitroGLYCERIN (NITROSTAT) 0.4 MG SL tablet Place 1 tablet (0.4 mg total) under the tongue every 5 (five) minutes as needed for chest pain. 08/13/16   Minus Breeding, MD  ondansetron (ZOFRAN) 4 MG tablet Take 1 tablet (4 mg total) by mouth every 8 (eight) hours as needed for nausea or vomiting. 07/09/16   Quintella Reichert, MD  pantoprazole (PROTONIX) 40 MG tablet Take 1 tablet (40 mg total) by mouth daily. 08/13/16   Minus Breeding, MD  Potassium Chloride ER 20 MEQ TBCR Take 20 mEq by mouth daily. 08/13/16   Minus Breeding, MD  ticagrelor (BRILINTA) 90 MG TABS tablet Take 1 tablet (90 mg total) by mouth 2 (two) times daily. 02/09/17   Minus Breeding, MD    Family History Family History  Problem Relation Age of Onset  . Diabetes Mother   . Arthritis Mother   . CAD Mother 57    CABG  . CAD Brother   . CAD Sister 40    CABG  . Stroke Brother   . Stroke Daughter   . Diabetes Maternal Grandmother   . Stroke Maternal Grandmother   . Stroke Father   . Breast cancer      niece  . Heart failure Sister   . Kidney failure Sister   . Colon cancer Neg Hx   . Stomach cancer Neg Hx     Social History Social History  Substance Use Topics  . Smoking status: Never Smoker  . Smokeless tobacco: Never Used  . Alcohol use No     Allergies   Aspirin   Review of Systems Review of Systems  Constitutional: Negative for fever.  HENT: Negative for  rhinorrhea and sore throat.   Eyes: Negative for redness.  Respiratory: Negative for cough.   Cardiovascular: Negative for chest pain.  Gastrointestinal: Positive for abdominal pain. Negative for diarrhea, nausea and vomiting.  Genitourinary: Positive for flank pain. Negative for dysuria.  Musculoskeletal: Negative for myalgias.  Skin: Negative for rash.  Neurological: Negative for headaches.     Physical Exam Updated Vital Signs BP (!) 152/83   Pulse (!) 58   Temp 98.5 F (36.9 C) (Oral)   Resp 16   SpO2  99%   Physical Exam  Constitutional: She appears well-developed and well-nourished.  HENT:  Head: Normocephalic and atraumatic.  Mouth/Throat: Oropharynx is clear and moist.  Eyes: Conjunctivae are normal. Right eye exhibits no discharge. Left eye exhibits no discharge.  Neck: Normal range of motion. Neck supple.  Cardiovascular: Normal rate, regular rhythm and normal heart sounds.   No murmur heard. Pulmonary/Chest: Effort normal and breath sounds normal. No respiratory distress. She has no wheezes. She has no rales.  Abdominal: Soft. She exhibits no distension and no mass. There is tenderness (R flank, RUQ, mild). There is no guarding.  Neurological: She is alert.  Skin: Skin is warm and dry.  Psychiatric: She has a normal mood and affect.  Nursing note and vitals reviewed.    ED Treatments / Results  Labs (all labs ordered are listed, but only abnormal results are displayed) Labs Reviewed  COMPREHENSIVE METABOLIC PANEL - Abnormal; Notable for the following:       Result Value   Glucose, Bld 112 (*)    Creatinine, Ser 1.20 (*)    GFR calc non Af Amer 43 (*)    GFR calc Af Amer 50 (*)    All other components within normal limits  CBC WITH DIFFERENTIAL/PLATELET  URINALYSIS, ROUTINE W REFLEX MICROSCOPIC  I-STAT TROPOININ, ED    EKG  EKG Interpretation  Date/Time:  Saturday March 21 2017 06:19:28 EDT Ventricular Rate:  58 PR Interval:    QRS  Duration: 100 QT Interval:  451 QTC Calculation: 443 R Axis:   55 Text Interpretation:  Sinus rhythm Abnormal R-wave progression, early transition Probable LVH with secondary repol abnrm No significant change was found Confirmed by CAMPOS  MD, KEVIN ([id number]) on 03/21/2017 6:22:16 AM       Radiology Ct Renal Stone Study  Result Date: 03/21/2017 CLINICAL DATA:  Patient with intermittent right flank pain for 1 week. Right pelvic pain. Urinary urgency. EXAM: CT ABDOMEN AND PELVIS WITHOUT CONTRAST TECHNIQUE: Multidetector CT imaging of the abdomen and pelvis was performed following the standard protocol without IV contrast. COMPARISON:  CT abdomen pelvis 04/13/2012 FINDINGS: Lower chest: In the right middle lobe there is a 6 x 5 mm nodule (6 mm mean diameter) on image 11 of series 4. Dependent atelectasis within the bilateral lower lobes. Hepatobiliary: Liver is normal in size and contour. Patient status post cholecystectomy. Pancreas: Unremarkable Spleen: Unremarkable Adrenals/Urinary Tract: The adrenal glands are normal. Kidneys are symmetric in size. No hydronephrosis. No nephroureterolithiasis. Urinary bladder is decompressed. Stomach/Bowel: Normal morphology of the stomach. No evidence for bowel obstruction. No free intraperitoneal air. Sigmoid colonic diverticulosis without evidence for acute diverticulitis. Tests Vascular/Lymphatic: Normal caliber abdominal aorta. Peripheral calcified atherosclerotic plaque. No retroperitoneal lymphadenopathy. Reproductive: Status post hysterectomy. Adnexal structures are unremarkable. Other: Small amount of free fluid in the pelvis. Musculoskeletal: Lower thoracic and lumbar spine degenerative changes. No aggressive or acute appearing osseous lesions. IMPRESSION: No acute process within the abdomen or pelvis. No nephroureterolithiasis.  No hydronephrosis. Aortic atherosclerosis. Sigmoid colonic diverticulosis without evidence for acute diverticulitis. 6 mm nodule right  middle lobe. Non-contrast chest CT at 6-12 months is recommended. If the nodule is stable at time of repeat CT, then future CT at 18-24 months (from today's scan) is considered optional for low-risk patients, but is recommended for high-risk patients. This recommendation follows the consensus statement: Guidelines for Management of Incidental Pulmonary Nodules Detected on CT Images: From the Fleischner Society 2017; Radiology 2017; 284:228-243. Electronically Signed   By: Dian Situ  Rosana Hoes M.D.   On: 03/21/2017 09:15    Procedures Procedures (including critical care time)  Medications Ordered in ED Medications - No data to display   Initial Impression / Assessment and Plan / ED Course  I have reviewed the triage vital signs and the nursing notes.  Pertinent labs & imaging results that were available during my care of the patient were reviewed by me and considered in my medical decision making (see chart for details).     Patient seen and examined. Work-up initiated.   Vital signs reviewed and are as follows: BP (!) 158/70   Pulse (!) 56   Temp 98.5 F (36.9 C) (Oral)   Resp 15   SpO2 100%   Patient discussed with and seen by Dr. Sherry Ruffing.   Imaging is reassuring. Urine test does not demonstrate any infection.  Patient and family updated on plan. Patient will continue Tylenol, exercises, and heat at home as prescribed by her primary care doctor. I have encouraged her to call for an appointment on Monday for follow-up next week. Patient encouraged to return with worsening symptoms, fever, difficulty breathing, more persistent chest pains, new symptoms or other concerns. She verbalizes understanding and agrees with plan.\  Patient was also informed of incidental pulmonary nodule noted on CT imaging. Encouraged her to follow-up with her physician in 6 months for recheck.  Final Clinical Impressions(s) / ED Diagnoses   Final diagnoses:  Flank pain  Pulmonary nodule   Patient presents  with right flank pain. History and exam is consistent with musculoskeletal pain. Labs and imaging were performed today to rule out more severe cause. Imaging was negative for acute problems. Labs reassuring. No UTI. Patient improved in emergency department. She will continue plan as prescribed by her doctor last week, and follow-up in the coming week if symptoms do not improve. Return instructions as above, patient seems reliable to return with worsening.  New Prescriptions Discharge Medication List as of 03/21/2017 10:21 AM       Carlisle Cater, PA-C 03/21/17 [place], MD 03/25/17 1022

## 2017-03-28 IMAGING — DX DG CHEST 2V
2 series · 2 of 2 positions shown · non-contrast
Comparison: 07/09/2016

CLINICAL DATA: Palpitations since early this morning.  Weakness.

EXAM:
CHEST  2 VIEW

[w chest pa]
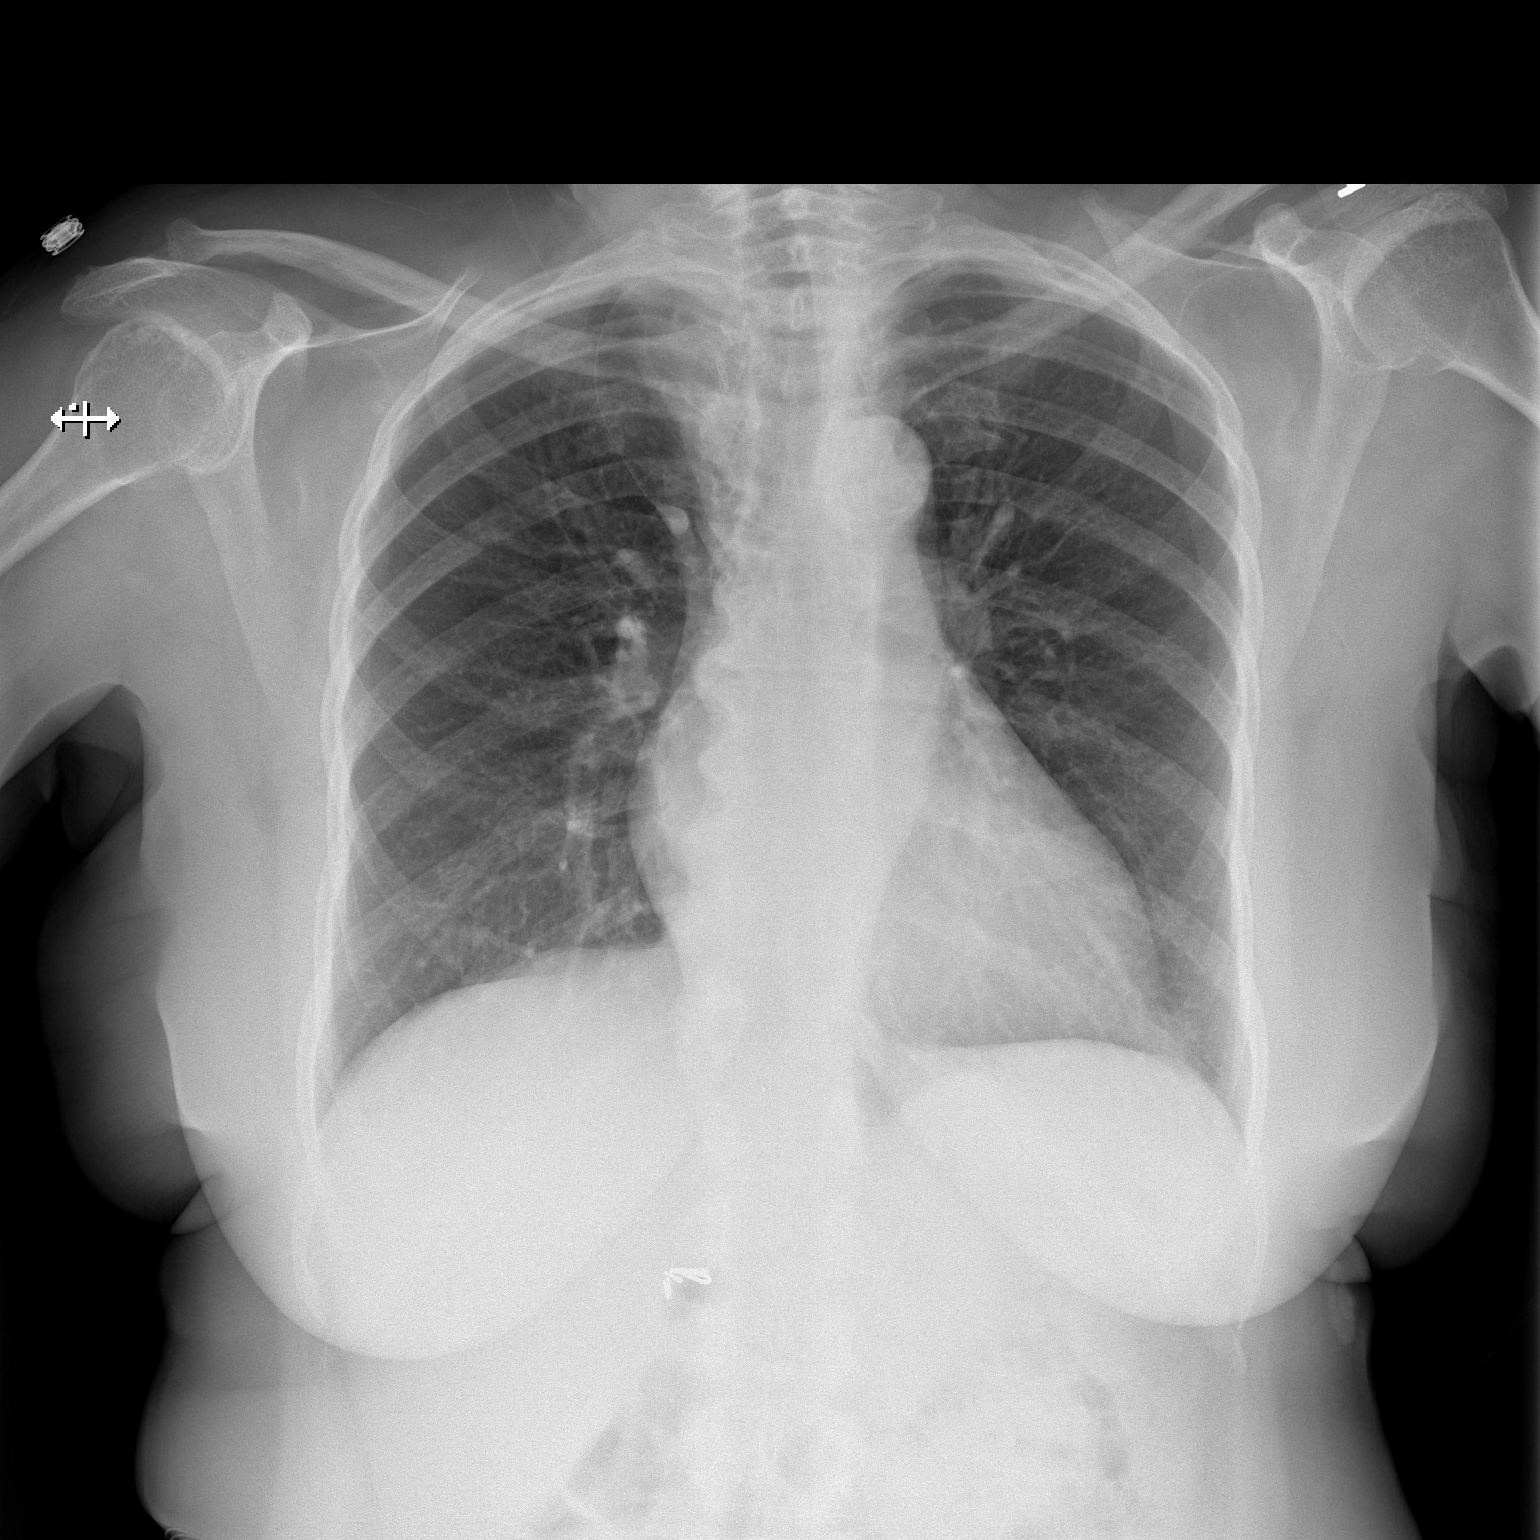

[w chest lat]
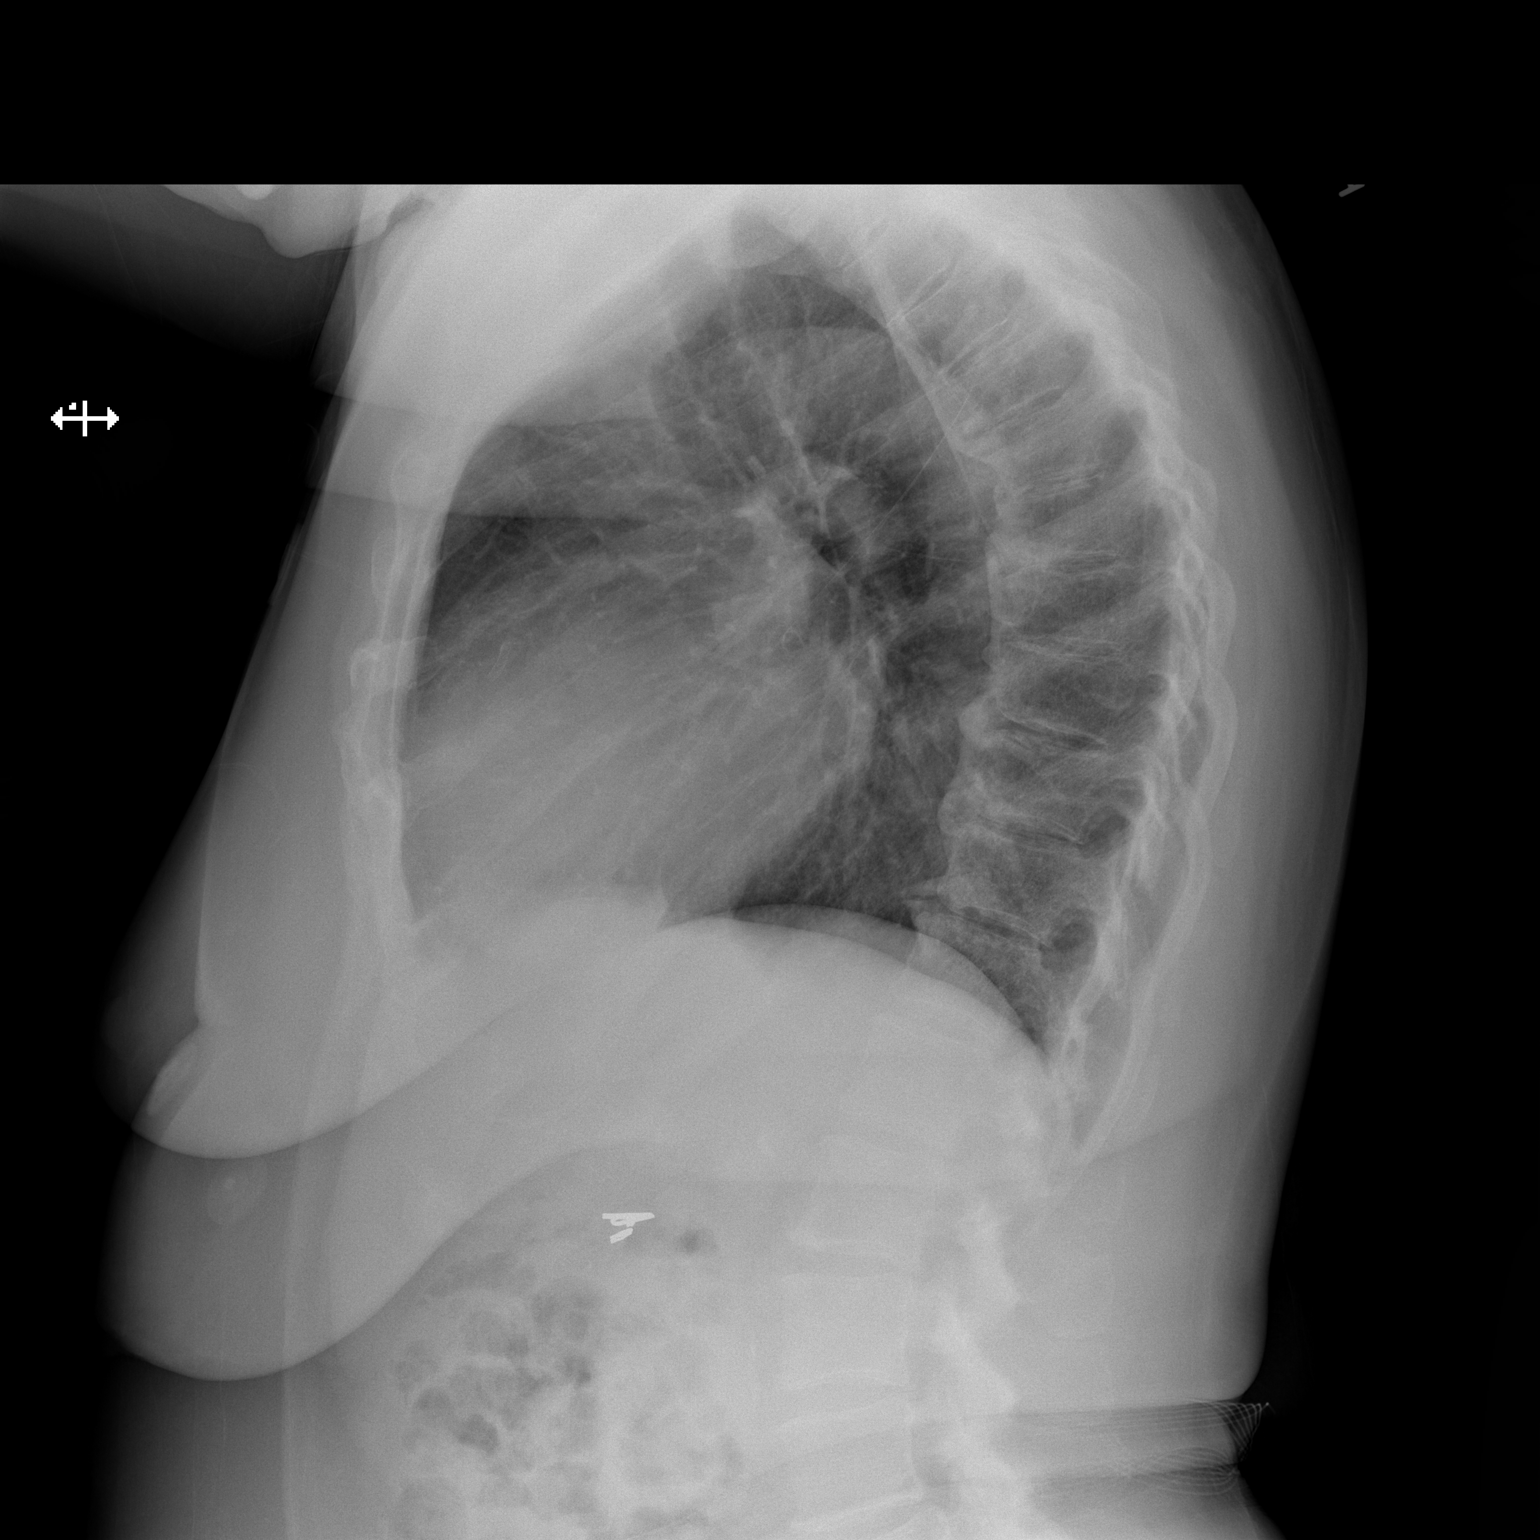

[2 of 2 positions shown; findings below may reference images not displayed]

FINDINGS: The cardiac silhouette is borderline to mildly enlarged in size.
Thoracic aortic atherosclerosis is noted. An incidental azygos
fissure is noted. No airspace consolidation, edema, pleural
effusion, or pneumothorax is identified. Right upper quadrant
abdominal surgical clips and thoracic spondylosis are noted.
IMPRESSION: 1. No active cardiopulmonary disease.
2. Aortic atherosclerosis.

## 2017-05-18 ENCOUNTER — Encounter: Payer: Self-pay | Admitting: Internal Medicine

## 2017-05-18 ENCOUNTER — Ambulatory Visit: Payer: Medicare Other | Admitting: Internal Medicine

## 2017-05-18 ENCOUNTER — Ambulatory Visit (INDEPENDENT_AMBULATORY_CARE_PROVIDER_SITE_OTHER): Payer: Medicare Other | Admitting: Internal Medicine

## 2017-05-18 VITALS — BP 130/70 | HR 60 | Temp 98.0°F | Ht 64.0 in | Wt 164.0 lb

## 2017-05-18 DIAGNOSIS — E1159 Type 2 diabetes mellitus with other circulatory complications: Secondary | ICD-10-CM

## 2017-05-18 DIAGNOSIS — R5383 Other fatigue: Secondary | ICD-10-CM

## 2017-05-18 DIAGNOSIS — R7989 Other specified abnormal findings of blood chemistry: Secondary | ICD-10-CM | POA: Diagnosis not present

## 2017-05-18 DIAGNOSIS — E559 Vitamin D deficiency, unspecified: Secondary | ICD-10-CM | POA: Diagnosis not present

## 2017-05-18 LAB — POCT GLYCOSYLATED HEMOGLOBIN (HGB A1C): Hemoglobin A1C: 6.3

## 2017-05-18 NOTE — Progress Notes (Signed)
[facility] Progress Note  Subjective:  Heidi Wolf is a 76 y.o. female with history of HTN, glaucoma, and T2DM who presents to follow-up diabetes and with complaint of fatigue.   #T2DM - Last A1c was 6.0 on 12/26/16. Stopped glipizide 09/22/16, as was only on 2.5 mg daily and had A1c of 5.8 at that time. - Last seen by [facility] 03/05/17 and to be seen again in 3 months (follows regularly for glaucoma) - Walks in [place] with her daughter about once a week. Would like to exercise more but says there is a frightening dog in her neighborhood.  - Eats her largest meal in the afternoon and a smaller meal in the evening.  - Rarely eats sweets - Brought her CBG log and highest sugar recorded was 122 (says she had a "treat" that day). Mostly in the 90s. Denies symptoms of hypoglycemia. - On benazepril ROS: No abdominal pain  #Fatigue - Has felt more tired than usual the last week, has been harder to get out of bed - Has history of vitamin D deficiency and now taking 1000 U vitamin D every other day - Denies dark stools but is due for colonoscopy due to history of polyps; having to hold off while on brilinta s/p PCI last summer. Will see Cardiologist Dr. Percival Spanish in July and may be able to come off brilinta at that time.  - Her younger sister passed away earlier this year, which has been difficult, but she copes by keeping in close contact with her nieces ROS: No dizziness or falls, no fevers or chills, no weight loss, no chest pain or shortness of breath  Past Medical History:  Diagnosis Date  . Anemia   . Anxiety   . Arthritis    "right hip" (09/21/2014)  . CAD (coronary artery disease)    PCI 90% diagonal stenosis witha DES July 2017.  Nonobstructive disease elsewhere.   . Cataracts, both eyes   . Colon polyps    Hyperplastic 2003  . External hemorrhoid   . GERD (gastroesophageal reflux disease)   . Glaucoma   . Hyperlipidemia   . Hypertension   .  Internal hemorrhoid   . Personal history of colonic polyps 03/03/2012  . Type II diabetes mellitus ([place])    type 2    Social History   Social History  . Marital status: Legally Separated    Spouse name: N/A  . Number of children: 3  . Years of education: N/A   Occupational History  . retired    Social History Main Topics  . Smoking status: Never Smoker  . Smokeless tobacco: Never Used  . Alcohol use No  . Drug use: No  . Sexual activity: No   Other Topics Concern  . Not on file   Social History Narrative   LIves with Tammy    Allergies  Allergen Reactions  . Aspirin Other (See Comments)    Makes stomach burn    Objective: Blood pressure 130/70, pulse 60, temperature 98 F (36.7 C), temperature source Oral, height 5\' 4"  (1.626 m), weight 164 lb (74.4 kg), SpO2 98 %. Body mass index is 28.15 kg/m. Constitutional: Well-appearing female in NAD HENT: MMM, EOMI, conjunctiva somewhat pale Cardiovascular: RRR, S1, S2, no m/r/g.  Pulmonary/Chest: Effort normal and breath sounds normal. No respiratory distress.  Abdominal: Soft. +BS, NT Neurological: AOx3, no focal deficits. Psychiatric: Normal mood and affect.  Vitals reviewed  Assessment/Plan: Type 2 diabetes mellitus with circulatory  disorder ([place]) - Alc increased at 6.3, compared to in the 5s in the past year; still within goal A1c of < 6.5 in setting of CAD - Continue with lifestyle management at this time but would consider restarting glipizide if CBGs trend up more - Counseled patient to try to incorporate more regular exercise. She mentioned a few low-cost gyms she would have her daughter look into for her.  - Has regular follow-up with Ophthalmology  Fatigue - Acute. Will screen for anemia, electrolyte abnormalities, and vitamin D deficiency - Recommended trying to incorporate more regular exercise - To have colonoscopy once off brilinta; will discuss with her Cardiologist  Follow-up in about 3 months for  blood sugar check.  Olene Floss, MD [place], PGY-2

## 2017-05-18 NOTE — Patient Instructions (Signed)
Heidi Wolf,  Continue to stay off the glipizide. We can recheck your sugars in another 3 months to see how things are going.  I will get labs today -- check you electrolytes, check for anemia, and vitamin D level -- to see if that could be contributing to your fatigue.  Please ask Dr. Percival Spanish about when you will come off the brillinta to know when you can schedule the colonoscopy.  Best, Dr. Ola Spurr

## 2017-05-19 LAB — CBC
Hematocrit: 39.9 % (ref 34.0–46.6)
Hemoglobin: 13.4 g/dL (ref 11.1–15.9)
MCH: 26.6 pg (ref 26.6–33.0)
MCHC: 33.6 g/dL (ref 31.5–35.7)
MCV: 79 fL (ref 79–97)
Platelets: 301 10*3/uL (ref 150–379)
RBC: 5.04 x10E6/uL (ref 3.77–5.28)
RDW: 14.9 % (ref 12.3–15.4)
WBC: 9.7 10*3/uL (ref 3.4–10.8)

## 2017-05-19 LAB — COMPREHENSIVE METABOLIC PANEL
ALT: 17 IU/L (ref 0–32)
AST: 16 IU/L (ref 0–40)
Albumin/Globulin Ratio: 1.2 (ref 1.2–2.2)
Albumin: 4.3 g/dL (ref 3.5–4.8)
Alkaline Phosphatase: 71 IU/L (ref 39–117)
BUN/Creatinine Ratio: 18 (ref 12–28)
BUN: 20 mg/dL (ref 8–27)
Bilirubin Total: 0.6 mg/dL (ref 0.0–1.2)
CO2: 25 mmol/L (ref 18–29)
Calcium: 9.9 mg/dL (ref 8.7–10.3)
Chloride: 104 mmol/L (ref 96–106)
Creatinine, Ser: 1.12 mg/dL — ABNORMAL HIGH (ref 0.57–1.00)
GFR calc Af Amer: 55 mL/min/{1.73_m2} — ABNORMAL LOW (ref >59)
GFR calc non Af Amer: 48 mL/min/{1.73_m2} — ABNORMAL LOW (ref >59)
Globulin, Total: 3.5 g/dL (ref 1.5–4.5)
Glucose: 96 mg/dL (ref 65–99)
Potassium: 4.2 mmol/L (ref 3.5–5.2)
Sodium: 142 mmol/L (ref 134–144)
Total Protein: 7.8 g/dL (ref 6.0–8.5)

## 2017-05-19 LAB — VITAMIN D 25 HYDROXY (VIT D DEFICIENCY, FRACTURES): Vit D, 25-Hydroxy: 32.5 ng/mL (ref 30.0–100.0)

## 2017-05-21 ENCOUNTER — Telehealth: Payer: Self-pay | Admitting: Internal Medicine

## 2017-05-21 DIAGNOSIS — R5383 Other fatigue: Secondary | ICD-10-CM

## 2017-05-21 HISTORY — DX: Other fatigue: R53.83

## 2017-05-21 NOTE — Assessment & Plan Note (Signed)
-   Alc increased at 6.3, compared to in the 5s in the past year; still within goal A1c of < 6.5 in setting of CAD - Continue with lifestyle management at this time but would consider restarting glipizide if CBGs trend up more - Counseled patient to try to incorporate more regular exercise. She mentioned a few low-cost gyms she would have her daughter look into for her.  - Has regular follow-up with Ophthalmology

## 2017-05-21 NOTE — Telephone Encounter (Signed)
Left message for patient letting her know she is not anemic or low in Vitamin D. SCr better than last measurement but with grade 3a GFR so recommended still avoiding NSAIDs.

## 2017-05-21 NOTE — Assessment & Plan Note (Addendum)
-   Acute. Will screen for anemia, electrolyte abnormalities, and vitamin D deficiency - Recommended trying to incorporate more regular exercise - To have colonoscopy once off brilinta; will discuss with her Cardiologist

## 2017-06-18 ENCOUNTER — Encounter: Payer: Self-pay | Admitting: Cardiology

## 2017-06-29 DIAGNOSIS — H401131 Primary open-angle glaucoma, bilateral, mild stage: Secondary | ICD-10-CM | POA: Diagnosis not present

## 2017-07-05 NOTE — Progress Notes (Signed)
Cardiology Office Note   Date:  07/06/2017   ID:  Heidi Wolf, DOB 1941-02-20, MRN [id number]  PCP:  Rogue Bussing, MD  Cardiologist:   Minus Breeding, MD   Chief Complaint  Patient presents with  . Coronary Artery Disease      History of Present Illness: Heidi Wolf is a 76 y.o. female who presents for follow up of CAD the patient had unstable anigna and was found to have a high grade stenosis of the diagonal treated with a DES.  There was non obstructive disease elsewhere.  She had recurrent pain and repeat cath and the stent was found to be patent.  She does well.  The patient denies any new symptoms such as chest discomfort, neck or arm discomfort. There has been no new shortness of breath, PND or orthopnea. There have been no reported palpitations, presyncope or syncope.  She has fatigue without clear etiology.  She has not required NTG since I last saw her.  She was in the ED with hip pain.     Past Medical History:  Diagnosis Date  . Anemia   . Anxiety   . Arthritis    "right hip" (09/21/2014)  . CAD (coronary artery disease)    PCI 90% diagonal stenosis witha DES July 2017.  Nonobstructive disease elsewhere.   . Cataracts, both eyes   . Colon polyps    Hyperplastic 2003  . External hemorrhoid   . GERD (gastroesophageal reflux disease)   . Glaucoma   . Hyperlipidemia   . Hypertension   . Internal hemorrhoid   . Personal history of colonic polyps 03/03/2012  . Type II diabetes mellitus ([place])    type 2    Past Surgical History:  Procedure Laterality Date  . ABDOMINAL HYSTERECTOMY    . APPENDECTOMY     "w/gallbladder"  . CARDIAC CATHETERIZATION  X 2  . CARDIAC CATHETERIZATION N/A 07/03/2016   Procedure: Left Heart Cath and Coronary Angiography;  Surgeon: Dixie Dials, MD;  Location: [facility];  Service: Cardiovascular;  Laterality: N/A;  . CARDIAC CATHETERIZATION N/A 07/03/2016   Procedure: Coronary Stent Intervention;  Surgeon: Jettie Booze, MD;  Location: [facility];  Service: Cardiovascular;  Laterality: N/A;  . CARDIAC CATHETERIZATION N/A 07/15/2016   Procedure: Left Heart Cath and Coronary Angiography;  Surgeon: Dixie Dials, MD;  Location: [facility];  Service: Cardiovascular;  Laterality: N/A;  . CHOLECYSTECTOMY    . COLONOSCOPY    . TUBAL LIGATION  1971     Current Outpatient Prescriptions  Medication Sig Dispense Refill  . acetaminophen (TYLENOL) 325 MG tablet Take 650 mg by mouth every 6 (six) hours as needed for headache.    Marland Kitchen amLODipine (NORVASC) 5 MG tablet Take 1 tablet (5 mg total) by mouth daily. 30 tablet 12  . aspirin EC 81 MG tablet Take 1 tablet (81 mg total) by mouth daily.    Marland Kitchen atorvastatin (LIPITOR) 40 MG tablet Take 1 tablet (40 mg total) by mouth every evening. 90 tablet 3  . benazepril (LOTENSIN) 20 MG tablet Take 1 tablet (20 mg total) by mouth daily. 90 tablet 3  . cholecalciferol (VITAMIN D) 1000 UNITS tablet Take 1,000 Units by mouth every other day.     Marland Kitchen glucose blood (ACCU-CHEK AVIVA PLUS) test strip Use as instructed 100 each 12  . loratadine (CLARITIN) 10 MG tablet Take 10 mg by mouth daily as needed for allergies.     Marland Kitchen  LUMIGAN 0.01 % SOLN Place 1 drop into both eyes at bedtime.     . metoprolol tartrate (LOPRESSOR) 25 MG tablet Take 1 tablet (25 mg total) by mouth 2 (two) times daily. 180 tablet 3  . pantoprazole (PROTONIX) 40 MG tablet Take 1 tablet (40 mg total) by mouth daily. 90 tablet 3  . Potassium Chloride ER 20 MEQ TBCR Take 20 mEq by mouth daily. 90 tablet 3  . nitroGLYCERIN (NITROSTAT) 0.4 MG SL tablet PLACE 1 TABLET UNDER TONGUE EVERY 5 MINUTES AS NEEDED CHEST PAIN, SEEK MEDICAL ATTENTION IF NO RELIEF AFTER 2ND DOSE 25 tablet 1   No current facility-administered medications for this visit.     Allergies:   Aspirin   ROS:  Please see the history of present illness.   Otherwise, review of systems are positive none.   All other systems are reviewed and  negative.    PHYSICAL EXAM: VS:  BP (!) 146/73 (BP Location: Right Arm, Cuff Size: Normal)   Pulse (!) 45   Ht 5\' 4"  (1.626 m)   Wt 162 lb 9.6 oz (73.8 kg)   BMI 27.91 kg/m  , BMI Body mass index is 27.91 kg/m.  GENERAL:  Well appearing NECK:  No jugular venous distention, waveform within normal limits, carotid upstroke brisk and symmetric, no bruits, no thyromegaly LUNGS:  Clear to auscultation bilaterally CHEST:  Unremarkable HEART:  PMI not displaced or sustained,S1 and S2 within normal limits, no S3, no S4, no clicks, no rubs, 2 out of 6 soft apical systolic murmur brief nonradiating, no diastolic murmurs ABD:  Flat, positive bowel sounds normal in frequency in pitch, no bruits, no rebound, no guarding, no midline pulsatile mass, no hepatomegaly, no splenomegaly EXT:  2 plus pulses throughout, no edema, no cyanosis no clubbing   EKG:  EKG is   not ordered today.   Recent Labs: 08/11/2016: TSH 0.70 05/18/2017: ALT 17; BUN 20; Creatinine, Ser 1.12; Hemoglobin 13.4; Platelets 301; Potassium 4.2; Sodium 142    Lipid Panel    Component Value Date/Time   CHOL 108 07/15/2016 0111   TRIG 101 07/15/2016 0111   HDL 41 07/15/2016 0111   CHOLHDL 2.6 07/15/2016 0111   VLDL 20 07/15/2016 0111   LDLCALC 47 07/15/2016 0111     Lab Results  Component Value Date   HGBA1C 6.3 05/18/2017    Wt Readings from Last 3 Encounters:  07/06/17 162 lb 9.6 oz (73.8 kg)  05/18/17 164 lb (74.4 kg)  03/17/17 165 lb (74.8 kg)      Other studies Reviewed: Additional studies/ records that were reviewed today include: ED records Review of the above records demonstrates:     ASSESSMENT AND PLAN:  CAD:  She can now stop her Brilinta.  The patient has no new sypmtoms.  No further cardiovascular testing is indicated.  We will continue with aggressive risk reduction and meds as listed.   PALPITATIONS:  These are not particularly problematic.  No change in therapy is planned.   HTN:   The blood  pressure is at target. No change in medications is indicated. We will continue with therapeutic lifestyle changes (TLC).  DM:   This is being managed by her PCP.   Her A1C was as above.  No change in therapy.   HYPERLIPIDEMIA:  She will come back for a fasting lipid profile.     Current medicines are reviewed at length with the patient today.  The patient does not have concerns regarding medicines.  The following changes have been made:   As above  Labs/ tests ordered today include:    Orders Placed This Encounter  Procedures  . Lipid panel  . Hepatic function panel     Disposition:   FU with APP in 12 months.     Signed, Minus Breeding, MD  07/06/2017 7:39 PM    [facility]

## 2017-07-06 ENCOUNTER — Ambulatory Visit (INDEPENDENT_AMBULATORY_CARE_PROVIDER_SITE_OTHER): Payer: Medicare Other | Admitting: Cardiology

## 2017-07-06 ENCOUNTER — Other Ambulatory Visit: Payer: Self-pay | Admitting: Cardiology

## 2017-07-06 ENCOUNTER — Encounter: Payer: Self-pay | Admitting: Cardiology

## 2017-07-06 VITALS — BP 146/73 | HR 45 | Ht 64.0 in | Wt 162.6 lb

## 2017-07-06 DIAGNOSIS — I2510 Atherosclerotic heart disease of native coronary artery without angina pectoris: Secondary | ICD-10-CM | POA: Diagnosis not present

## 2017-07-06 DIAGNOSIS — E785 Hyperlipidemia, unspecified: Secondary | ICD-10-CM | POA: Diagnosis not present

## 2017-07-06 DIAGNOSIS — I10 Essential (primary) hypertension: Secondary | ICD-10-CM | POA: Diagnosis not present

## 2017-07-06 DIAGNOSIS — Z9861 Coronary angioplasty status: Secondary | ICD-10-CM

## 2017-07-06 MED ORDER — NITROGLYCERIN 0.4 MG SL SUBL: 0.4000 mg | SUBLINGUAL_TABLET | SUBLINGUAL | 1 refills | Status: DC | PRN

## 2017-07-06 MED ORDER — PANTOPRAZOLE SODIUM 40 MG PO TBEC: 40.0000 mg | DELAYED_RELEASE_TABLET | Freq: Every day | ORAL | 3 refills | Status: DC

## 2017-07-06 MED ORDER — BENAZEPRIL HCL 20 MG PO TABS: 20.0000 mg | ORAL_TABLET | Freq: Every day | ORAL | 3 refills | Status: DC

## 2017-07-06 MED ORDER — METOPROLOL TARTRATE 25 MG PO TABS: 25.0000 mg | ORAL_TABLET | Freq: Two times a day (BID) | ORAL | 3 refills | Status: DC

## 2017-07-06 NOTE — Patient Instructions (Signed)
Medication Instructions:  STOP- Brilinta  Labwork: Fasting Lipid Liver  Testing/Procedures: None Ordered  Follow-Up: Your physician wants you to follow-up in: 1 Year with APP. You will receive a reminder letter in the mail two months in advance. If you don't receive a letter, please call our office to schedule the follow-up appointment.   Any Other Special Instructions Will Be Listed Below (If Applicable).   If you need a refill on your cardiac medications before your next appointment, please call your pharmacy.

## 2017-07-08 DIAGNOSIS — I10 Essential (primary) hypertension: Secondary | ICD-10-CM | POA: Diagnosis not present

## 2017-07-08 DIAGNOSIS — E785 Hyperlipidemia, unspecified: Secondary | ICD-10-CM | POA: Diagnosis not present

## 2017-07-09 LAB — HEPATIC FUNCTION PANEL
ALT: 15 IU/L (ref 0–32)
AST: 20 IU/L (ref 0–40)
Albumin: 4.3 g/dL (ref 3.5–4.8)
Alkaline Phosphatase: 71 IU/L (ref 39–117)
Bilirubin Total: 0.7 mg/dL (ref 0.0–1.2)
Bilirubin, Direct: 0.16 mg/dL (ref 0.00–0.40)
Total Protein: 7.5 g/dL (ref 6.0–8.5)

## 2017-07-09 LAB — LIPID PANEL
Chol/HDL Ratio: 3.9 ratio (ref 0.0–4.4)
Cholesterol, Total: 161 mg/dL (ref 100–199)
HDL: 41 mg/dL (ref >39)
LDL Calculated: 94 mg/dL (ref 0–99)
Triglycerides: 130 mg/dL (ref 0–149)
VLDL Cholesterol Cal: 26 mg/dL (ref 5–40)

## 2017-07-15 ENCOUNTER — Telehealth: Payer: Self-pay | Admitting: *Deleted

## 2017-07-15 ENCOUNTER — Telehealth: Payer: Self-pay | Admitting: Cardiology

## 2017-07-15 DIAGNOSIS — E785 Hyperlipidemia, unspecified: Secondary | ICD-10-CM

## 2017-07-15 DIAGNOSIS — Z79899 Other long term (current) drug therapy: Secondary | ICD-10-CM

## 2017-07-15 MED ORDER — ATORVASTATIN CALCIUM 80 MG PO TABS: 80.0000 mg | ORAL_TABLET | Freq: Every evening | ORAL | 3 refills | Status: DC

## 2017-07-15 NOTE — Telephone Encounter (Signed)
-----   Message from Minus Breeding, MD sent at 07/09/2017 11:41 AM EDT ----- Increase Lipitor to 80 mg daily and repeat a lipid profile and liver enzymes in 8 weeks.  Call Ms. Percifield with the results and send results to Rogue Bussing, MD

## 2017-07-15 NOTE — Telephone Encounter (Signed)
Pt aware of her blood work, Atorvastatin increase to 80 mg and was send to pt pharmacy, fasting lipids and liver was ordered and lab slip was mailed out to pt.

## 2017-07-15 NOTE — Telephone Encounter (Signed)
Pt aware of her blood work and medication changes.Heidi Wolf

## 2017-07-15 NOTE — Telephone Encounter (Signed)
New Message ° ° pt verbalized that she is returning call for rn °

## 2017-08-04 ENCOUNTER — Encounter: Payer: Self-pay | Admitting: Internal Medicine

## 2017-08-04 ENCOUNTER — Ambulatory Visit (INDEPENDENT_AMBULATORY_CARE_PROVIDER_SITE_OTHER): Payer: Medicare Other | Admitting: Internal Medicine

## 2017-08-04 VITALS — BP 138/80 | HR 46 | Temp 98.6°F | Ht 64.0 in | Wt 163.0 lb

## 2017-08-04 DIAGNOSIS — E1159 Type 2 diabetes mellitus with other circulatory complications: Secondary | ICD-10-CM | POA: Diagnosis present

## 2017-08-04 DIAGNOSIS — I2510 Atherosclerotic heart disease of native coronary artery without angina pectoris: Secondary | ICD-10-CM

## 2017-08-04 DIAGNOSIS — Z9861 Coronary angioplasty status: Secondary | ICD-10-CM | POA: Diagnosis not present

## 2017-08-04 DIAGNOSIS — I10 Essential (primary) hypertension: Secondary | ICD-10-CM | POA: Diagnosis not present

## 2017-08-04 NOTE — Patient Instructions (Signed)
Heidi Wolf,  Continue to stay active and watch the starches and sugars in your diet.  Please see me back in about 3 months to check your sugars.  Best, Dr. Ola Spurr

## 2017-08-04 NOTE — Progress Notes (Signed)
[facility] Progress Note  Subjective:  Heidi Wolf is a 76 y.o. female with history of CAD s/p PCI, HLD, HTN, and T2DM. She presents to follow-up diabetes and BP.  #T2DM: - Currently diet-controlled. Previously on glipizide 2.5 mg but stopped last year given age and risk of hypoglycemia since tightly controlled.  - Watching what she eats. Has sweets only occasionally and usually is free fruit.  - Trying to do more walking. Occasionally goes mall walking with daughter. Says her son now has a car and plans to take her to the park (there are dogs in the neighborhood that make her feel unsafe to walk around there). Does gardening. - Brought CBG log today which shows fasting CBG high of 116 but mostly 90s.  - Sees ophthalmologist regularly. - Statin increased by Cardiology recently.  ROS: No numbness of feet, no abdominal pain, no increased urinary frequency  #HTN: - Taking benazepril 20 mg and amlodipine 5 mg - Denies trouble taking medication ROS: No recent chest pain   Other: Now off brilinta so will contact GI about scheduling colonoscopy (polyps in 2013).   Allergies  Allergen Reactions  . Aspirin Other (See Comments)    Makes stomach burn    Objective: Blood pressure 138/80, pulse (!) 46, temperature 98.6 F (37 C), temperature source Oral, height 5\' 4"  (1.626 m), weight 163 lb (73.9 kg), SpO2 97 %. Body mass index is 27.98 kg/m. Constitutional: Overweight female, very pleasant Cardiovascular: RRR, S1, S2, no m/r/g.  Pulmonary/Chest: Effort normal and breath sounds normal. No respiratory distress.  Musculoskeletal: No LE swelling.  Neurological: AOx3, no focal deficits. Skin: No wounds on feet.   Vitals reviewed  Assessment/Plan: 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HCC) - Well controlled by diet with fasting CBGs generally <110 - Encouraged more regular exercise - Discussed starches and provided handout on low glycemic index  foods.  Essential hypertension - At goal of <140/90. - Continue current regimen of benazepril and amlodipine. SCr stable with labs done in the spring.   Follow-up in about 3 months for A1c.   Olene Floss, MD [place], PGY-3

## 2017-08-06 NOTE — Assessment & Plan Note (Signed)
-   At goal of <140/90. - Continue current regimen of benazepril and amlodipine. SCr stable with labs done in the spring.

## 2017-08-06 NOTE — Assessment & Plan Note (Signed)
-   Well controlled by diet with fasting CBGs generally <110 - Encouraged more regular exercise - Discussed starches and provided handout on low glycemic index foods.

## 2017-08-11 ENCOUNTER — Other Ambulatory Visit: Payer: Self-pay

## 2017-08-11 ENCOUNTER — Other Ambulatory Visit: Payer: Self-pay | Admitting: Cardiology

## 2017-08-11 MED ORDER — BENAZEPRIL HCL 20 MG PO TABS: 20.0000 mg | ORAL_TABLET | Freq: Every day | ORAL | 3 refills | Status: DC

## 2017-08-11 MED ORDER — PANTOPRAZOLE SODIUM 40 MG PO TBEC: 40.0000 mg | DELAYED_RELEASE_TABLET | Freq: Every day | ORAL | 3 refills | Status: DC

## 2017-09-15 ENCOUNTER — Telehealth: Payer: Self-pay | Admitting: Cardiology

## 2017-09-15 ENCOUNTER — Emergency Department (HOSPITAL_COMMUNITY)
Admission: EM | Admit: 2017-09-15 | Discharge: 2017-09-15 | Disposition: A | Discharge status: Home / Self Care | Payer: Medicare Other | Attending: Emergency Medicine | Admitting: Emergency Medicine

## 2017-09-15 ENCOUNTER — Encounter (HOSPITAL_COMMUNITY): Payer: Self-pay | Admitting: *Deleted

## 2017-09-15 ENCOUNTER — Emergency Department (HOSPITAL_COMMUNITY): Payer: Medicare Other

## 2017-09-15 DIAGNOSIS — R079 Chest pain, unspecified: Secondary | ICD-10-CM | POA: Insufficient documentation

## 2017-09-15 DIAGNOSIS — I2510 Atherosclerotic heart disease of native coronary artery without angina pectoris: Secondary | ICD-10-CM | POA: Diagnosis not present

## 2017-09-15 DIAGNOSIS — I10 Essential (primary) hypertension: Secondary | ICD-10-CM | POA: Diagnosis not present

## 2017-09-15 DIAGNOSIS — E119 Type 2 diabetes mellitus without complications: Secondary | ICD-10-CM | POA: Diagnosis not present

## 2017-09-15 DIAGNOSIS — R0789 Other chest pain: Secondary | ICD-10-CM

## 2017-09-15 DIAGNOSIS — Z7982 Long term (current) use of aspirin: Secondary | ICD-10-CM | POA: Diagnosis not present

## 2017-09-15 DIAGNOSIS — K529 Noninfective gastroenteritis and colitis, unspecified: Secondary | ICD-10-CM

## 2017-09-15 DIAGNOSIS — R072 Precordial pain: Secondary | ICD-10-CM | POA: Diagnosis not present

## 2017-09-15 DIAGNOSIS — R7989 Other specified abnormal findings of blood chemistry: Secondary | ICD-10-CM | POA: Diagnosis not present

## 2017-09-15 DIAGNOSIS — E785 Hyperlipidemia, unspecified: Secondary | ICD-10-CM | POA: Diagnosis not present

## 2017-09-15 DIAGNOSIS — Z79899 Other long term (current) drug therapy: Secondary | ICD-10-CM | POA: Insufficient documentation

## 2017-09-15 LAB — BASIC METABOLIC PANEL
Anion gap: 5 (ref 5–15)
BUN: 16 mg/dL (ref 6–20)
CO2: 23 mmol/L (ref 22–32)
Calcium: 9.3 mg/dL (ref 8.9–10.3)
Chloride: 107 mmol/L (ref 101–111)
Creatinine, Ser: 1.2 mg/dL — ABNORMAL HIGH (ref 0.44–1.00)
GFR calc Af Amer: 50 mL/min — ABNORMAL LOW (ref >60)
GFR calc non Af Amer: 43 mL/min — ABNORMAL LOW (ref >60)
Glucose, Bld: 143 mg/dL — ABNORMAL HIGH (ref 65–99)
Potassium: 4 mmol/L (ref 3.5–5.1)
Sodium: 135 mmol/L (ref 135–145)

## 2017-09-15 LAB — CBC
HCT: 40.9 % (ref 36.0–46.0)
Hemoglobin: 13.2 g/dL (ref 12.0–15.0)
MCH: 26.2 pg (ref 26.0–34.0)
MCHC: 32.3 g/dL (ref 30.0–36.0)
MCV: 81.3 fL (ref 78.0–100.0)
Platelets: 260 10*3/uL (ref 150–400)
RBC: 5.03 MIL/uL (ref 3.87–5.11)
RDW: 14.2 % (ref 11.5–15.5)
WBC: 8.4 10*3/uL (ref 4.0–10.5)

## 2017-09-15 LAB — I-STAT TROPONIN, ED
Troponin i, poc: 0.02 ng/mL (ref 0.00–0.08)
Troponin i, poc: 0.01 ng/mL (ref 0.00–0.08)

## 2017-09-15 MED ORDER — LIDOCAINE VISCOUS 2 % MT SOLN
15.0000 mL | Freq: Once | OROMUCOSAL | Status: AC
  Administered 2017-09-15: 15 mL via OROMUCOSAL
  Filled 2017-09-15: qty 15

## 2017-09-15 MED ORDER — ACETAMINOPHEN 325 MG PO TABS
325.0000 mg | ORAL_TABLET | Freq: Once | ORAL | Status: AC
  Administered 2017-09-15: 325 mg via ORAL
  Filled 2017-09-15: qty 1

## 2017-09-15 MED ORDER — PANTOPRAZOLE SODIUM 40 MG PO TBEC
40.0000 mg | DELAYED_RELEASE_TABLET | Freq: Once | ORAL | Status: AC
  Administered 2017-09-15: 40 mg via ORAL
  Filled 2017-09-15: qty 1

## 2017-09-15 MED ORDER — ASPIRIN 81 MG PO CHEW
162.0000 mg | CHEWABLE_TABLET | Freq: Once | ORAL | Status: AC
  Administered 2017-09-15: 162 mg via ORAL
  Filled 2017-09-15: qty 2

## 2017-09-15 MED ORDER — ALUM & MAG HYDROXIDE-SIMETH 200-200-20 MG/5ML PO SUSP
15.0000 mL | Freq: Once | ORAL | Status: AC
  Administered 2017-09-15: 15 mL via ORAL
  Filled 2017-09-15: qty 30

## 2017-09-15 NOTE — ED Provider Notes (Signed)
[facility] DEPT Provider Note   CSN: [id number] Arrival date & time: 09/15/17  0930   History   Chief Complaint Chief Complaint  Patient presents with  . Chest Pain    HPI Heidi Wolf is a 76 y.o. female.  HPI   Patient is a 30 age female with a history of hyperlipidemia, diabetes mellitus (not on antihyperglycemics), NSTEMI with prior PCI, and GERD presenting to the emergency department complaining of chest pain. The pain began in the evening of 9 17-18  and worsened through the night. Patient reports that the pain was initially a 9 out of 10 and a pressure-like quality in the substernal to left chest region. Patient reports that it radiated circumferentially around her neck and to the right shoulder. Additionally, patient reports that she vomited twice in the night due to chest pain and nausea. Upon wakening, patient reports that the pain was improved however she felt diaphoretic, weak, and short of breath, and had some palpitations. No syncope/presyncope. Patient reports that this is similar to her prior episode prompting PCI. Pain and shortness of breath made worse by lying down. One sublingual nitroglycerin improved the pain yesterday evening. Patient denies any cough, hemoptysis, or abdominal pain. Patient episodes of diarrhea yesterday morning.  Cardiologist: Dr. Percival Spanish, [place]. Prior MI: Patient underwent PCI to the second diagonal in July 2017 with no residual stenosis . Additionally patient had 20-25% stenosis of proximal circumflex, 2 lesions as of July 2017. No prior echo cardiograms on record and no prior stress test to review at this time.   Pulmonary History: Smoking: Never smoker Asthma/ COPD/ Bronchitis: Previously treated for asthma, not treated now. No recent exacerbation episodes. PE Risk factors:  No unilateral leg swelling, redness, or calf TTP, no tachycardia, no immobilization or surgery in previous 4 weeks, no personal history of DVT/PE, no  hemoptysis, and no treatment of active malignancy in previous 6 months.    Past Medical History:  Diagnosis Date  . Anemia   . Anxiety   . Arthritis    "right hip" (09/21/2014)  . CAD (coronary artery disease)    PCI 90% diagonal stenosis witha DES July 2017.  Nonobstructive disease elsewhere.   . Cataracts, both eyes   . Colon polyps    Hyperplastic 2003  . External hemorrhoid   . GERD (gastroesophageal reflux disease)   . Glaucoma   . Hyperlipidemia   . Hypertension   . Internal hemorrhoid   . Personal history of colonic polyps 03/03/2012  . Type II diabetes mellitus ([place])    type 2    Patient Active Problem List   Diagnosis Date Noted  . Fatigue 05/21/2017  . Essential hypertension 09/03/2016  . Dyslipidemia 09/03/2016  . 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[place]) 09/03/2016  . Palpitations 09/03/2016  . Chest pain 07/03/2016  . CAD- S/P PCI July 2017 07/02/2016  . 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 Chest pain at rest 01/17/2014  . Personal history of colonic polyps 03/03/2012  . Internal hemorrhoids without mention of complication 31/51/7616    Past Surgical History:  Procedure Laterality Date  . ABDOMINAL HYSTERECTOMY    . APPENDECTOMY     "w/gallbladder"  . CARDIAC CATHETERIZATION  X 2  . CARDIAC CATHETERIZATION N/A 07/03/2016   Procedure: Left Heart Cath and Coronary Angiography;  Surgeon: Dixie Dials, MD;  Location: [facility];  Service: Cardiovascular;  Laterality: N/A;  . CARDIAC CATHETERIZATION N/A 07/03/2016  Procedure: Coronary Stent Intervention;  Surgeon: Jettie Booze, MD;  Location: [facility];  Service: Cardiovascular;  Laterality: N/A;  . CARDIAC CATHETERIZATION N/A 07/15/2016   Procedure: Left Heart Cath and Coronary Angiography;  Surgeon: Dixie Dials, MD;  Location: [facility];  Service: Cardiovascular;  Laterality: N/A;  . CHOLECYSTECTOMY    . COLONOSCOPY    . TUBAL LIGATION   1971    OB History    No data available       Home Medications    Prior to Admission medications   Medication Sig Start Date End Date Taking? Authorizing Provider  acetaminophen (TYLENOL) 325 MG tablet Take 650 mg by mouth every 6 (six) hours as needed for headache.   Yes [provider]  amLODipine (NORVASC) 5 MG tablet Take 1 tablet (5 mg total) by mouth daily. 08/13/16  Yes Minus Breeding, MD  aspirin EC 81 MG tablet Take 1 tablet (81 mg total) by mouth daily. 07/04/16  Yes Dixie Dials, MD  atorvastatin (LIPITOR) 80 MG tablet Take 1 tablet (80 mg total) by mouth every evening. 07/15/17  Yes Minus Breeding, MD  benazepril (LOTENSIN) 20 MG tablet Take 1 tablet (20 mg total) by mouth daily. 08/11/17  Yes Minus Breeding, MD  cholecalciferol (VITAMIN D) 1000 UNITS tablet Take 1,000 Units by mouth every other day.    Yes [provider]  loratadine (CLARITIN) 10 MG tablet Take 10 mg by mouth daily as needed for allergies.    Yes [provider]  LUMIGAN 0.01 % SOLN Place 1 drop into both eyes at bedtime.  04/13/14  Yes [provider]  metoprolol tartrate (LOPRESSOR) 25 MG tablet Take 1 tablet (25 mg total) by mouth 2 (two) times daily. 07/06/17  Yes Minus Breeding, MD  nitroGLYCERIN (NITROSTAT) 0.4 MG SL tablet PLACE 1 TABLET UNDER TONGUE EVERY 5 MINUTES AS NEEDED CHEST PAIN, SEEK MEDICAL ATTENTION IF NO RELIEF AFTER 2ND DOSE 07/06/17  Yes Minus Breeding, MD  pantoprazole (PROTONIX) 40 MG tablet Take 1 tablet (40 mg total) by mouth daily. 08/11/17  Yes Minus Breeding, MD  Potassium Chloride ER 20 MEQ TBCR Take 20 mEq by mouth daily. 08/13/16  Yes Minus Breeding, MD  glucose blood (ACCU-CHEK AVIVA PLUS) test strip Use as instructed 11/12/16   Rogue Bussing, MD    Family History Family History  Problem Relation Age of Onset  . Diabetes Mother   . Arthritis Mother   . CAD Mother 38       CABG  . CAD Brother   . CAD Sister 30       CABG  .  Stroke Brother   . Stroke Daughter   . Diabetes Maternal Grandmother   . Stroke Maternal Grandmother   . Stroke Father   . Breast cancer Unknown        niece  . Heart failure Sister   . Kidney failure Sister   . Colon cancer Neg Hx   . Stomach cancer Neg Hx     Social History Social History  Substance Use Topics  . Smoking status: Never Smoker  . Smokeless tobacco: Never Used  . Alcohol use No     Allergies   Aspirin   Review of Systems Review of Systems  Constitutional: Negative for chills and fever.  HENT: Negative for congestion, rhinorrhea and sore throat.   Eyes: Negative for visual disturbance.  Respiratory: Positive for chest tightness and shortness of breath. Negative for cough.  Cardiovascular: Positive for chest pain and palpitations. Negative for leg swelling.  Gastrointestinal: Positive for diarrhea, nausea and vomiting. Negative for abdominal pain.  Genitourinary: Negative for dysuria.  Musculoskeletal: Negative for back pain and myalgias.  Skin: Negative for rash.  Neurological: Positive for dizziness and light-headedness. Negative for syncope and headaches.     Physical Exam Updated Vital Signs There were no vitals taken for this visit.  Physical Exam  Constitutional: She appears well-developed and well-nourished. No distress.  HENT:  Head: Normocephalic and atraumatic.  Mouth/Throat: Oropharynx is clear and moist.  Eyes: Pupils are equal, round, and reactive to light. Conjunctivae and EOM are normal.  Neck: Normal range of motion. Neck supple.  Cardiovascular: Normal rate, regular rhythm, S1 normal and S2 normal.   Murmur heard. Blowing early diastolic murmur  Pulmonary/Chest: Effort normal and breath sounds normal. She has no wheezes. She has no rales.  Abdominal: Soft. She exhibits no distension. There is no tenderness. There is no guarding.  Musculoskeletal: Normal range of motion. She exhibits no edema or deformity.  Lymphadenopathy:     She has no cervical adenopathy.  Neurological: She is alert.  Cranial nerves grossly intact. Moves extremities with good coordination and without difficulty.  Skin: Skin is warm and dry. No rash noted. No erythema.  Psychiatric: She has a normal mood and affect. Her behavior is normal. Judgment and thought content normal.  Nursing note and vitals reviewed.    ED Treatments / Results  Labs (all labs ordered are listed, but only abnormal results are displayed) Labs Reviewed  CBC  BASIC METABOLIC PANEL  I-STAT TROPONIN, ED    EKG  EKG Interpretation  Date/Time:  Tuesday September 15 2017 09:33:30 EDT Ventricular Rate:  49 PR Interval:  182 QRS Duration: 92 QT Interval:  456 QTC Calculation: 411 R Axis:   68 Text Interpretation:  Sinus bradycardia Minimal voltage criteria for LVH, may be normal variant ST-t wave abnormality No significant change since last tracing Abnormal ekg Confirmed by Carmin Muskrat [id number]) on 09/15/2017 9:40:07 AM       Radiology No results found.  Procedures Procedures (including critical care time)  Medications Ordered in ED Medications - No data to display   Initial Impression / Assessment and Plan / ED Course  I have reviewed the triage vital signs and the nursing notes.  Pertinent labs & imaging results that were available during my care of the patient were reviewed by me and considered in my medical decision making (see chart for details).  Clinical Course as of Sep 15 1400  Tue Sep 15, 2017  1046 Patient seen and evaluated. Case discussed with Dr. Deno Etienne.   [AM]  1223 Patient reevaluated. Patient reports that her pain is improved. She has not had any further episodes of chest pain and emergency Department. Informed that we are still waiting for delta troponin and repeat EKG.  [AM]  48 Spoke with Dr. Carney Living in cardiology, who suggested GI cocktail for patient's symptoms in that patient can follow-up in the outpatient clinic.  [AM]      Clinical Course User Index [AM] Albesa Seen, PA-C    Final Clinical Impressions(s) / ED Diagnoses   Final diagnoses:  None   MDM  Patient is a 7 age female with a history of hyperlipidemia, diabetes mellitus (not on antihyperglycemics), NSTEMI with prior PCI, and GERD presenting to the emergency department complaining of chest pain. Doubt ACS, as delta troponin is negative, initial and repeat EKGs show  no signs of ischemia, infarction, or arrhythmia. Doubt PE as Well's score 0, and patient not tachycardic. Doubt TAD by hx, CXR showed no widening mediastinum, and pulses equal in all extremities. Patient remained nontoxic appearing and in no acute distress during emergency department course. Vital signs stable in the emergency department and showing consistent bradycardia (patient is on beta blocker). Therefore, doubt esophageal rupture, cardiac tamponade, or pneumothorax. Pericarditis less likely due to no preceding infectious symptoms and pain not improved in upright positions. Abnormal labs include elevated serum creatinine, slightly elevated compared to prior. Patient instructed to have this repeated outpatient. Patient is aware of diagnostic uncertainty and was given strict return precautions for symptoms concerning for ACS such as severe chest pain, especially if the pain is crushing or pressure-like and spreads to the arms, back, neck, or jaw, or if they have sweating, nausea, or shortness of breath with the pain, if they wake from sleep with chest pain or shortness of breath, if they feel dizzy or faint, if they have chest pain not typical of their usual pain, or if they have any other emergent concerns regarding their health.   Case discussed with cardiologist, Dr. Carney Living, who suggested patient's symptoms sounded more gastrointestinal in nature, and suggested GI cocktail in emergency department.  Patient and family understand and are in agreement with plan of care.  This is a shared  visit with Dr. Deno Etienne. Patient was independently evaluated by this attending physician. Attending physician consulted in evaluation and discharge management.  New Prescriptions New Prescriptions   No medications on file     Tamala Julian 09/15/17 [place], [place], DO 09/18/17 1844

## 2017-09-15 NOTE — Consult Note (Signed)
Cardiology Consultation:   Patient ID: Heidi Wolf; [id number]; 10/16/1941   Admit date: 09/15/2017 Date of Consult: 09/15/2017  Primary Care Provider: Rogue Bussing, MD Primary Cardiologist: Dr. Percival Wolf    Patient Profile:   Heidi Wolf is a 76 y.o. female with a hx of CAD s/p DES to diagonal 06/2016, HTN, HLD and DM who is being seen today for the evaluation of chest pain at the request of Dr. Tyrone Wolf.   Admitted 06/2016 with unstable angina. She found to have a high grade stenosis of the diagonal treated with a DES7/6/18.  Re look 07/15/16 showed patent stent. Last seen by Dr. Percival Wolf 07/06/17.   History of Present Illness:   Heidi Wolf was in [place] until yesterday when she had substernal chest pressure/sharp pain in evening. Associated with SOB. Symptoms resolved with SL nitro x 1. She did not felt well  Prior to this. Eventually fall a sleep. She woke up recurrent chest pressure whoever worsen with radiation of R shoulder and jaw. Associated with SOB and vomiting. She took another nitro with improved symptoms but did not resolved. She called office and advised to come ER. Symptoms similar to prior angina when she had stent placement.   Currently 5/10 chest discomfort. Troponin 0.02. EKG showed sinus rhythm at rate of 49 bpm and TWI in inferior laterally. No acute changes. CXR showed acute cardiopulmonary disease.   Sleeps on multiple pillows chronically. Brillinta discontinue on 06/2017.  Past Medical History:  Diagnosis Date  . Anemia   . Anxiety   . Arthritis    "right hip" (09/21/2014)  . CAD (coronary artery disease)    PCI 90% diagonal stenosis witha DES July 2017.  Nonobstructive disease elsewhere.   . Cataracts, both eyes   . Colon polyps    Hyperplastic 2003  . External hemorrhoid   . GERD (gastroesophageal reflux disease)   . Glaucoma   . Hyperlipidemia   . Hypertension   . Internal hemorrhoid   . Personal history of colonic polyps 03/03/2012  . Type II  diabetes mellitus ([place])    type 2    Past Surgical History:  Procedure Laterality Date  . ABDOMINAL HYSTERECTOMY    . APPENDECTOMY     "w/gallbladder"  . CARDIAC CATHETERIZATION  X 2  . CARDIAC CATHETERIZATION N/A 07/03/2016   Procedure: Left Heart Cath and Coronary Angiography;  Surgeon: Heidi Dials, MD;  Location: [facility] [facility];  Service: Cardiovascular;  Laterality: N/A;  . CARDIAC CATHETERIZATION N/A 07/03/2016   Procedure: Coronary Stent Intervention;  Surgeon: Heidi Booze, MD;  Location: [facility];  Service: Cardiovascular;  Laterality: N/A;  . CARDIAC CATHETERIZATION N/A 07/15/2016   Procedure: Left Heart Cath and Coronary Angiography;  Surgeon: Heidi Dials, MD;  Location: [facility];  Service: Cardiovascular;  Laterality: N/A;  . CHOLECYSTECTOMY    . COLONOSCOPY    . TUBAL LIGATION  1971     Home Medications:  Prior to Admission medications   Medication Sig Start Date End Date Taking? Authorizing Provider  acetaminophen (TYLENOL) 325 MG tablet Take 650 mg by mouth every 6 (six) hours as needed for headache.   Yes [provider]  amLODipine (NORVASC) 5 MG tablet Take 1 tablet (5 mg total) by mouth daily. 08/13/16  Yes Heidi Breeding, MD  aspirin EC 81 MG tablet Take 1 tablet (81 mg total) by mouth daily. 07/04/16  Yes Heidi Dials, MD  atorvastatin (LIPITOR) 80 MG tablet Take 1  tablet (80 mg total) by mouth every evening. 07/15/17  Yes Heidi Breeding, MD  benazepril (LOTENSIN) 20 MG tablet Take 1 tablet (20 mg total) by mouth daily. 08/11/17  Yes Heidi Breeding, MD  cholecalciferol (VITAMIN D) 1000 UNITS tablet Take 1,000 Units by mouth every other day.    Yes [provider]  loratadine (CLARITIN) 10 MG tablet Take 10 mg by mouth daily as needed for allergies.    Yes [provider]  LUMIGAN 0.01 % SOLN Place 1 drop into both eyes at bedtime.  04/13/14  Yes [provider]  metoprolol tartrate (LOPRESSOR) 25 MG  tablet Take 1 tablet (25 mg total) by mouth 2 (two) times daily. 07/06/17  Yes Heidi Breeding, MD  nitroGLYCERIN (NITROSTAT) 0.4 MG SL tablet PLACE 1 TABLET UNDER TONGUE EVERY 5 MINUTES AS NEEDED CHEST PAIN, SEEK MEDICAL ATTENTION IF NO RELIEF AFTER 2ND DOSE 07/06/17  Yes Heidi Breeding, MD  pantoprazole (PROTONIX) 40 MG tablet Take 1 tablet (40 mg total) by mouth daily. 08/11/17  Yes Heidi Breeding, MD  Potassium Chloride ER 20 MEQ TBCR Take 20 mEq by mouth daily. 08/13/16  Yes Heidi Breeding, MD  glucose blood (ACCU-CHEK AVIVA PLUS) test strip Use as instructed 11/12/16   Heidi Bussing, MD    Inpatient Medications: Scheduled Meds:  Continuous Infusions:  PRN Meds:   Allergies:    Allergies  Allergen Reactions  . Aspirin Other (See Comments)    Makes stomach burn    Social History:   Social History   Social History  . Marital status: Legally Separated    Spouse name: N/A  . Number of children: 3  . Years of education: N/A   Occupational History  . retired    Social History Main Topics  . Smoking status: Never Smoker  . Smokeless tobacco: Never Used  . Alcohol use No  . Drug use: No  . Sexual activity: No   Other Topics Concern  . Not on file   Social History Narrative   LIves with Heidi Wolf    Family History:    Family History  Problem Relation Age of Onset  . Diabetes Mother   . Arthritis Mother   . CAD Mother 46       CABG  . CAD Brother   . CAD Sister 44       CABG  . Stroke Brother   . Stroke Daughter   . Diabetes Maternal Grandmother   . Stroke Maternal Grandmother   . Stroke Father   . Breast cancer Unknown        niece  . Heart failure Sister   . Kidney failure Sister   . Colon cancer Neg Hx   . Stomach cancer Neg Hx      ROS:  Please see the history of present illness.  ROS All other ROS reviewed and negative.     Physical Exam/Data:   Vitals:   09/15/17 1000 09/15/17 1115 09/15/17 1200  BP: 134/64 125/69 (!) 148/65  Pulse:  (!) 48 (!) 47 (!) 53  Resp: 16 12   SpO2: 98% 99% 99%   No intake or output data in the 24 hours ending 09/15/17 1251 There were no vitals filed for this visit. There is no height or weight on file to calculate BMI.  General:  Well nourished, well developed, in no acute distress HEENT: normal Lymph: no adenopathy Neck: no JVD Endocrine:  No thryomegaly Vascular: No carotid bruits; FA pulses 2+ bilaterally without  bruits  Cardiac:  normal S1, S2; RRR; no murmur  Lungs:  clear to auscultation bilaterally, no wheezing, rhonchi or rales  Abd: soft, nontender, no hepatomegaly  Ext: no edema Musculoskeletal:  No deformities, BUE and BLE strength normal and equal Skin: warm and dry  Neuro:  CNs 2-12 intact, no focal abnormalities noted Psych:  Normal affect     Relevant CV Studies: Left Heart Cath and Coronary Angiography  07/15/16  Conclusion    Ost 2nd Diag lesion, 30% stenosed. The lesion was not previously treated.  Ost Cx to Prox Cx lesion, 20% stenosed. The lesion was not previously treated.  Prox Cx lesion, 25% stenosed. The lesion was not previously treated.   Check platelet inhibition and consider Brilinta use.     Laboratory Data:  Chemistry Recent Labs Lab 09/15/17 0939  NA 135  K 4.0  CL 107  CO2 23  GLUCOSE 143*  BUN 16  CREATININE 1.20*  CALCIUM 9.3  GFRNONAA 43*  GFRAA 50*  ANIONGAP 5    Hematology Recent Labs Lab 09/15/17 0939  WBC 8.4  RBC 5.03  HGB 13.2  HCT 40.9  MCV 81.3  MCH 26.2  MCHC 32.3  RDW 14.2  PLT 260   Recent Labs Lab 09/15/17 0953  TROPIPOC 0.02   Radiology/Studies:  Dg Chest 2 View  Result Date: 09/15/2017 CLINICAL DATA:  Chest pain since yesterday. EXAM: CHEST  2 VIEW COMPARISON:  08/06/2016 FINDINGS: The heart is upper limits of normal in size given the AP projection. There is stable tortuosity and calcification of the thoracic aorta. The lungs are clear of acute process. No worrisome pulmonary lesions. No  pleural effusion. The bony thorax is intact and appears stable with remote thoracic compression deformities and degenerative changes. IMPRESSION: No acute cardiopulmonary findings. Electronically Signed   By: Marijo Sanes M.D.   On: 09/15/2017 10:20    Assessment and Plan:   1. Chest pain with hx of CAD s/p DES to diagonal  - Responsive to SL nitro. Typical and atypical symptoms. EKG without acute ischemic changes. Troponin negative x 1. Trial of GI cocktail.   2. HTN - Relatively stable. Continue current medications  3. HLD - 07/08/2017: Cholesterol, Total 161; HDL 41; LDL Calculated 94; Triglycerides 130  - Continue statin. LDL not at goal.   For questions or updates, please contact [place] Please consult [email or web address] for contact info under Cardiology/STEMI.   Jarrett Soho, PA  09/15/2017 12:51 PM

## 2017-09-15 NOTE — Telephone Encounter (Signed)
New message      Pt c/o of Chest Pain: STAT if CP now or developed within 24 hours  1. Are you having CP right now?  Slight chest pain----worse during the night  2. Are you experiencing any other symptoms (ex. SOB, nausea, vomiting, sweating)?  Vomited x2, neck/shoulder pain  3. How long have you been experiencing CP?  Yesterday evening  4. Is your CP continuous or coming and going?  continuous  5. Have you taken Nitroglycerin?  Yes-----took pill last night?

## 2017-09-15 NOTE — ED Notes (Signed)
MD Tyrone Nine at bedside

## 2017-09-15 NOTE — ED Triage Notes (Signed)
To ED for eval of chest tightness that started yesterday but relieved with nito. Woke this am with same tightness but with radiation up neck and to left shoulder. Took another nitro with relief and told by cards to come to ED. Stats she woke last pm to vomit x 2.

## 2017-09-15 NOTE — ED Notes (Signed)
Patient transported to X-ray 

## 2017-09-15 NOTE — ED Notes (Signed)
ED Provider at bedside. 

## 2017-09-15 NOTE — Discharge Instructions (Addendum)
Please see the information and instructions below regarding your visit.  Your diagnoses today include:  1. Chest pain, unspecified type   2. Elevated serum creatinine    You were seen today for chest pain. Your exam and testing is reassuring that acute coronary processes not occurring. Your creatinine, a kidney number, is elevated today. This can be repeated with you upcoming blood work. Tests performed today include: EKGs, troponin, blood counts, electrolytes, chest xray. See side panel of your discharge paperwork for testing performed today. Vital signs are listed at the bottom of these instructions.   Medications prescribed:    You may resume your home medications tonight. Take any prescribed medications only as prescribed, and any over the counter medications only as directed on the packaging.  Home care instructions:  Please follow any educational materials contained in this packet.   Follow-up instructions:  Please follow up with Dr. Percival Spanish and office will call to make an appointment.  Return instructions:  Please return to the Emergency Department if you experience worsening symptoms. Worsening chest pain, especially if the pain is crushing or pressure-like and spreads to the arms, back, neck, or jaw, or if they have sweating, nausea, or shortness of breath with the pain.   An attack of chest pain lasting longer than usual despite rest and treatment with the medications prescribed, if you wake from sleep with chest pain or shortness of breath, if you feel dizzy or faint, if you have chest pain not typical of their usual pain. Please return if you have any other emergent concerns.   Your vital signs today were: BP (!) 158/75 (BP Location: Right Arm)    Pulse 65    Resp 16    SpO2 99%  If your blood pressure (BP) was elevated on multiple readings during this visit above 130 for the top number or above 80 for the bottom number, please have this repeated by your primary care  provider within one month. --------------  Thank you for allowing [place] to participate in your care today.

## 2017-09-15 NOTE — Telephone Encounter (Signed)
Spoke with patient of Dr. Percival Spanish who has h/o CAD with PCI ~ 1 year ago She took NTG x1 last nite, pain eased up Chest is sore and hurts on left side She has had N/V - emesis x2 Patient stopped Brilinta 07/06/17 Advised patient have someone drive her to [facility] ED for eval of acute chest pain symptoms Informed her I will notify nurse in ED and also [place] and spoke with Lenna Sciara, RN and notified them of patient's pending arrival by private vehicle LM for [place] w/info of ED referral

## 2017-09-18 DIAGNOSIS — E785 Hyperlipidemia, unspecified: Secondary | ICD-10-CM | POA: Diagnosis not present

## 2017-09-18 DIAGNOSIS — Z79899 Other long term (current) drug therapy: Secondary | ICD-10-CM | POA: Diagnosis not present

## 2017-09-19 LAB — LIPID PANEL
Chol/HDL Ratio: 2.3 ratio (ref 0.0–4.4)
Cholesterol, Total: 96 mg/dL — ABNORMAL LOW (ref 100–199)
HDL: 41 mg/dL (ref >39)
LDL Calculated: 40 mg/dL (ref 0–99)
Triglycerides: 76 mg/dL (ref 0–149)
VLDL Cholesterol Cal: 15 mg/dL (ref 5–40)

## 2017-09-19 LAB — HEPATIC FUNCTION PANEL
ALT: 175 IU/L — ABNORMAL HIGH (ref 0–32)
AST: 39 IU/L (ref 0–40)
Albumin: 4.3 g/dL (ref 3.5–4.8)
Alkaline Phosphatase: 100 IU/L (ref 39–117)
Bilirubin Total: 0.6 mg/dL (ref 0.0–1.2)
Bilirubin, Direct: 0.16 mg/dL (ref 0.00–0.40)
Total Protein: 7.3 g/dL (ref 6.0–8.5)

## 2017-09-28 ENCOUNTER — Telehealth: Payer: Self-pay | Admitting: *Deleted

## 2017-09-28 DIAGNOSIS — Z79899 Other long term (current) drug therapy: Secondary | ICD-10-CM

## 2017-09-28 NOTE — Telephone Encounter (Signed)
-----   Message from Minus Breeding, MD sent at 09/25/2017 12:09 AM EDT ----- ALT is elevated.  Please repeat this.  Lipids are excellent.  Call Ms. Bartolini with the results and send results to Rogue Bussing, MD

## 2017-09-28 NOTE — Telephone Encounter (Signed)
Pt made aware of her blood work, pt have appt on Wednesday October 3rd with Kerin Ransom, PA she will get blood work done on this day.

## 2017-09-30 ENCOUNTER — Encounter: Payer: Self-pay | Admitting: Cardiology

## 2017-09-30 ENCOUNTER — Ambulatory Visit (INDEPENDENT_AMBULATORY_CARE_PROVIDER_SITE_OTHER): Payer: Medicare Other | Admitting: Cardiology

## 2017-09-30 VITALS — BP 144/68 | HR 49 | Ht 64.0 in | Wt 163.0 lb

## 2017-09-30 DIAGNOSIS — R079 Chest pain, unspecified: Secondary | ICD-10-CM

## 2017-09-30 DIAGNOSIS — E785 Hyperlipidemia, unspecified: Secondary | ICD-10-CM | POA: Diagnosis not present

## 2017-09-30 DIAGNOSIS — Z79899 Other long term (current) drug therapy: Secondary | ICD-10-CM | POA: Diagnosis not present

## 2017-09-30 DIAGNOSIS — R002 Palpitations: Secondary | ICD-10-CM | POA: Diagnosis not present

## 2017-09-30 DIAGNOSIS — I2510 Atherosclerotic heart disease of native coronary artery without angina pectoris: Secondary | ICD-10-CM | POA: Diagnosis not present

## 2017-09-30 DIAGNOSIS — I10 Essential (primary) hypertension: Secondary | ICD-10-CM

## 2017-09-30 DIAGNOSIS — Z9861 Coronary angioplasty status: Secondary | ICD-10-CM | POA: Diagnosis not present

## 2017-09-30 LAB — HEPATIC FUNCTION PANEL
ALT: 23 IU/L (ref 0–32)
AST: 20 IU/L (ref 0–40)
Albumin: 4.2 g/dL (ref 3.5–4.8)
Alkaline Phosphatase: 77 IU/L (ref 39–117)
Bilirubin Total: 0.5 mg/dL (ref 0.0–1.2)
Bilirubin, Direct: 0.14 mg/dL (ref 0.00–0.40)
Total Protein: 7.4 g/dL (ref 6.0–8.5)

## 2017-09-30 MED ORDER — METOPROLOL TARTRATE 25 MG PO TABS
12.5000 mg | ORAL_TABLET | Freq: Two times a day (BID) | ORAL | 3 refills | Status: DC
Start: 2017-09-30 — End: 2017-11-16

## 2017-09-30 NOTE — Assessment & Plan Note (Signed)
Holter Aug 2017- PACs, no arrhythmia. BB increased then, now bradycardic with some symptoms

## 2017-09-30 NOTE — Progress Notes (Signed)
09/30/2017 Heidi Wolf   Jan 15, 1941  [id number]  Primary Physician Rogue Bussing, MD Primary Cardiologist: Dr Percival Spanish  HPI:  Pleasant 76 y/o AA female with a history of CAD, s/p Dx PCI/ DES 07/03/16. Re look 07/15/16 showed patent stent. She had no other significant CAD and LV wall motion was normal at cath. She saw Dr Percival Spanish 08/11/16 and complained of intermittent palpitations. A Holter monitor showed PACs and her beta blocker was increased. Her LOV was 07/06/17 and she was doing well.  She is seen in the office today after an ED visit 09/15/17. The pt says she had some SSCP 09/14/17 while in bed. She took one NTG with relief. She woke up with recurrent chest pain and then had nausea. She took a NTG but contined to have symptoms. She called the office and was referred to the ED. She ruled out for an MI. We saw her in consult. Her symptoms seemed to improve with a GI cocktail. She was discharged home and seen int the office today. Her daughter accompanied her. She has not had recurrent chest pain. Some of her symptoms sounded typical, some atypical. She has been intolerant to Imdur in the past. I noted her HR was 48 and she admitted to fatigue with exertion, no syncope or pre syncope.    Current Outpatient Prescriptions  Medication Sig Dispense Refill  . acetaminophen (TYLENOL) 325 MG tablet Take 650 mg by mouth every 6 (six) hours as needed for headache.    Marland Kitchen amLODipine (NORVASC) 5 MG tablet Take 1 tablet (5 mg total) by mouth daily. 30 tablet 12  . aspirin EC 81 MG tablet Take 1 tablet (81 mg total) by mouth daily.    Marland Kitchen atorvastatin (LIPITOR) 80 MG tablet Take 1 tablet (80 mg total) by mouth every evening. 90 tablet 3  . benazepril (LOTENSIN) 20 MG tablet Take 1 tablet (20 mg total) by mouth daily. 90 tablet 3  . cholecalciferol (VITAMIN D) 1000 UNITS tablet Take 1,000 Units by mouth every other day.     Marland Kitchen glucose blood (ACCU-CHEK AVIVA PLUS) test strip Use as instructed 100 each 12    . loratadine (CLARITIN) 10 MG tablet Take 10 mg by mouth daily as needed for allergies.     Marland Kitchen LUMIGAN 0.01 % SOLN Place 1 drop into both eyes at bedtime.     . metoprolol tartrate (LOPRESSOR) 25 MG tablet Take 0.5 tablets (12.5 mg total) by mouth 2 (two) times daily. 90 tablet 3  . nitroGLYCERIN (NITROSTAT) 0.4 MG SL tablet PLACE 1 TABLET UNDER TONGUE EVERY 5 MINUTES AS NEEDED CHEST PAIN, SEEK MEDICAL ATTENTION IF NO RELIEF AFTER 2ND DOSE 25 tablet 1  . pantoprazole (PROTONIX) 40 MG tablet Take 1 tablet (40 mg total) by mouth daily. 90 tablet 3  . Potassium Chloride ER 20 MEQ TBCR Take 20 mEq by mouth daily. 90 tablet 3   No current facility-administered medications for this visit.     Allergies  Allergen Reactions  . Aspirin Other (See Comments)    Makes stomach burn    Past Medical History:  Diagnosis Date  . Anemia   . Anxiety   . Arthritis    "right hip" (09/21/2014)  . CAD (coronary artery disease)    PCI 90% diagonal stenosis witha DES July 2017.  Nonobstructive disease elsewhere.   . Cataracts, both eyes   . Colon polyps    Hyperplastic 2003  . External hemorrhoid   . GERD (gastroesophageal  reflux disease)   . Glaucoma   . Hyperlipidemia   . Hypertension   . Internal hemorrhoid   . Personal history of colonic polyps 03/03/2012  . Type II diabetes mellitus ([place])    type 2    Social History   Social History  . Marital status: Legally Separated    Spouse name: N/A  . Number of children: 3  . Years of education: N/A   Occupational History  . retired    Social History Main Topics  . Smoking status: Never Smoker  . Smokeless tobacco: Never Used  . Alcohol use No  . Drug use: No  . Sexual activity: No   Other Topics Concern  . Not on file   Social History Narrative   LIves with Tammy     Family History  Problem Relation Age of Onset  . Diabetes Mother   . Arthritis Mother   . CAD Mother 79       CABG  . CAD Brother   . CAD Sister 37       CABG  .  Stroke Brother   . Stroke Daughter   . Diabetes Maternal Grandmother   . Stroke Maternal Grandmother   . Stroke Father   . Breast cancer Unknown        niece  . Heart failure Sister   . Kidney failure Sister   . Colon cancer Neg Hx   . Stomach cancer Neg Hx      Review of Systems: General: negative for chills, fever, night sweats or weight changes.  Cardiovascular: negative for chest pain, dyspnea on exertion, edema, orthopnea, palpitations, paroxysmal nocturnal dyspnea or shortness of breath Dermatological: negative for rash Respiratory: negative for cough or wheezing Urologic: negative for hematuria Abdominal: negative for nausea, vomiting, diarrhea, bright red blood per rectum, melena, or hematemesis Neurologic: negative for visual changes, syncope, or dizziness All other systems reviewed and are otherwise negative except as noted above.    Blood pressure (!) 144/68, pulse (!) 49, height 5\' 4"  (1.626 m), weight 163 lb (73.9 kg).  General appearance: alert, cooperative and no distress Lungs: clear to auscultation bilaterally Heart: regular rate and rhythm Extremities: extremities normal, atraumatic, no cyanosis or edema Skin: Skin color, texture, turgor normal. No rashes or lesions Neurologic: Grossly normal  EKG NSR, HR 48, diffuse TWI- 1,2,3,AVL, AVF,V3-V6-LVH by volts-no change  ASSESSMENT AND PLAN:   Chest pain at rest ED visit 09/15/17. MI r/o, no further chest pain  CAD- S/P PCI July 2017 PCI/DES to Dx1 07/03/16- relook OK 07/15/16  Palpitations Holter Aug 2017- PACs, no arrhythmia. BB increased then, now bradycardic with some symptoms  Essential hypertension Controlled, LVH on EKG  Dyslipidemia LDL-40 on Lipitor 80 mg, though her LFTs were elevated- ALT 175 on 09/18/17   PLAN  My initial inclination was to add low dose Imdur but the pt's daughter says she could not take this. If the pt has more chest pain I would proceed with a Lexiscan. We discussed what  typical angina symptoms are and they will call if she has more chest pain.  I did suggest she cut her Metoprolol to 12.5 mg BID secondary to bradycardia and exertional fatigue, hopefully she won't have recurrent palpitations.  I ordered f/u LFTs- we may need to adjust her Lipitor.  F/U Dr Percival Spanish in two months.  Kerin Ransom PA-C 09/30/2017 10:30 AM

## 2017-09-30 NOTE — Assessment & Plan Note (Signed)
LDL-40 on Lipitor 80 mg, though her LFTs were elevated- ALT 175 on 09/18/17

## 2017-09-30 NOTE — Patient Instructions (Signed)
Medication Instructions: DECREASE the Metoprolol to 12.5 mg (half of the 25 mg tablet) twice daily.  If you need a refill on your cardiac medications before your next appointment, please call your pharmacy.   Labwork: Your physician recommends that you have the following done today: Hepatic.  Follow-Up: Your physician wants you to follow-up in: 2-3 months with Dr. Percival Spanish.    Thank you for choosing [facility]!!

## 2017-09-30 NOTE — Assessment & Plan Note (Signed)
Controlled, LVH on EKG

## 2017-09-30 NOTE — Assessment & Plan Note (Signed)
PCI/DES to Dx1 07/03/16- relook OK 07/15/16

## 2017-09-30 NOTE — Assessment & Plan Note (Signed)
ED visit 09/15/17. MI r/o, no further chest pain

## 2017-10-02 NOTE — Addendum Note (Signed)
Addended by: Milderd Meager on: 10/02/2017 11:09 AM   Modules accepted: Orders

## 2017-10-15 ENCOUNTER — Other Ambulatory Visit: Payer: Self-pay | Admitting: Family Medicine

## 2017-10-15 DIAGNOSIS — Z1231 Encounter for screening mammogram for malignant neoplasm of breast: Secondary | ICD-10-CM

## 2017-10-16 DIAGNOSIS — Z23 Encounter for immunization: Secondary | ICD-10-CM | POA: Diagnosis not present

## 2017-10-21 ENCOUNTER — Telehealth: Payer: Self-pay | Admitting: Internal Medicine

## 2017-10-21 NOTE — Telephone Encounter (Signed)
Pt is calling about her prior authorization for test strips and lancets.  [facility] on [address].

## 2017-10-22 NOTE — Telephone Encounter (Signed)
Will forward to MD and see if there has been an update. Kaya Klausing,CMA

## 2017-10-23 NOTE — Telephone Encounter (Signed)
Spoke with [employer] representative. They are sending [place] a form for medical necessity. I will be on the lookout for this today.

## 2017-10-27 NOTE — Telephone Encounter (Signed)
Patient calling to check status of test strips and lancets

## 2017-10-27 NOTE — Telephone Encounter (Signed)
Expect response from pharmacy in next 1-2 days. Prior auth sent 10/27/17.

## 2017-10-29 ENCOUNTER — Ambulatory Visit (AMBULATORY_SURGERY_CENTER): Payer: Self-pay

## 2017-10-29 VITALS — Ht 64.0 in | Wt 163.6 lb

## 2017-10-29 DIAGNOSIS — Z86010 Personal history of colonic polyps: Secondary | ICD-10-CM

## 2017-10-29 MED ORDER — NA SULFATE-K SULFATE-MG SULF 17.5-3.13-1.6 GM/177ML PO SOLN: 1.0000 | Freq: Once | ORAL | 0 refills | Status: AC

## 2017-10-29 NOTE — Progress Notes (Signed)
Denies allergies to eggs or soy products. Denies complication of anesthesia or sedation. Denies use of weight loss medication. Denies use of O2.   Emmi instructions declined. Does not have a home computer.

## 2017-11-03 ENCOUNTER — Ambulatory Visit (INDEPENDENT_AMBULATORY_CARE_PROVIDER_SITE_OTHER): Payer: Medicare Other | Admitting: Internal Medicine

## 2017-11-03 ENCOUNTER — Encounter: Payer: Self-pay | Admitting: Internal Medicine

## 2017-11-03 VITALS — BP 140/70 | HR 62 | Temp 98.7°F | Ht 64.0 in | Wt 165.2 lb

## 2017-11-03 DIAGNOSIS — E1159 Type 2 diabetes mellitus with other circulatory complications: Secondary | ICD-10-CM

## 2017-11-03 DIAGNOSIS — E118 Type 2 diabetes mellitus with unspecified complications: Secondary | ICD-10-CM | POA: Diagnosis present

## 2017-11-03 DIAGNOSIS — Z9861 Coronary angioplasty status: Secondary | ICD-10-CM | POA: Diagnosis not present

## 2017-11-03 DIAGNOSIS — I2510 Atherosclerotic heart disease of native coronary artery without angina pectoris: Secondary | ICD-10-CM | POA: Diagnosis not present

## 2017-11-03 LAB — POCT GLYCOSYLATED HEMOGLOBIN (HGB A1C): Hemoglobin A1C: 6.1

## 2017-11-03 NOTE — Patient Instructions (Signed)
Heidi Wolf,  Your A1c is excellent at 6.1.   I recommend trying to increase your exercise--perhaps 2 days a week of walking?  I will call your pharmacy about the lancets.  Please see me back in about 6 months for a check-up.  Best, Dr. Ola Spurr

## 2017-11-03 NOTE — Progress Notes (Signed)
[facility] Progress Note  Subjective:  Heidi Wolf is a 76 y.o. female with CAD s/p PCI, HLD, HTN, and T2DM who presents to follow-up diabetes.  DIABETES Issues: Has not been able to pick up lancets due to insurance issue but has received test strips.  Disease Monitoring: Blood Sugar ranges-89-129 Polyuria/phagia/dipsia- Denies      Visual problems- follows multiple times a year with Ophthalmologist Dr. Gershon Crane for glaucoma  Medications: Compliance- N/A; diet controlled Hypoglycemic symptoms- Denies  Exercise: [place] and occasionally mall walks with her daughter.  Other health maintenance: Colonoscopy scheduled for 11/12  CAD: Patient's metoprolol decreased to 12.5 mg BID 2/2 bradycardia and exertional fatigue in October. Lexiscan to be considered if has more chest pain, but patient denies at present and has not been needing to take nitro.   Allergies  Allergen Reactions  . Aspirin Other (See Comments)    Makes stomach burn    Social History   Tobacco Use  . Smoking status: Never Smoker  . Smokeless tobacco: Never Used  Substance Use Topics  . Alcohol use: No    Objective: Blood pressure 140/70, pulse 62, temperature 98.7 F (37.1 C), temperature source Oral, height 5\' 4"  (1.626 m), weight 165 lb 3.2 oz (74.9 kg), SpO2 97 %. Body mass index is 28.36 kg/m. Constitutional: Pleasant, overweight female in NAD Cardiovascular: RRR, S1, S2, no m/r/g.  Pulmonary/Chest: Effort normal and breath sounds normal.  Musculoskeletal: No LE edema. Foot exam without skin breakdown. Neurological: AOx3, no focal deficits. Peripheral sensation intact.  Psychiatric: Normal mood and affect.  Vitals reviewed  Assessment/Plan: 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HCC) - A1c 6.1 today with diet control. - Encouraged patient to try to incorporate some exercise into her weekly routine. She says she has a neighbor who might walk with her a couple times a week.   - Called pharmacy about lancets; should be ready for pick-up now - Advised patient she can reduce how often she checks her CBGs to a couple times a week and as needed if not feeling well  Follow-up in about 6 months for A1c check.  Olene Floss, MD [place], PGY-3

## 2017-11-05 ENCOUNTER — Encounter: Payer: Self-pay | Admitting: Internal Medicine

## 2017-11-05 NOTE — Assessment & Plan Note (Signed)
-   A1c 6.1 today with diet control. - Encouraged patient to try to incorporate some exercise into her weekly routine. She says she has a neighbor who might walk with her a couple times a week.  - Called pharmacy about lancets; should be ready for pick-up now - Advised patient she can reduce how often she checks her CBGs to a couple times a week and as needed if not feeling well

## 2017-11-09 ENCOUNTER — Other Ambulatory Visit: Payer: Self-pay

## 2017-11-09 ENCOUNTER — Ambulatory Visit (AMBULATORY_SURGERY_CENTER): Payer: Medicare Other | Admitting: Gastroenterology

## 2017-11-09 ENCOUNTER — Encounter: Payer: Self-pay | Admitting: Gastroenterology

## 2017-11-09 VITALS — BP 149/70 | HR 53 | Temp 97.3°F | Resp 11 | Ht 64.0 in | Wt 163.0 lb

## 2017-11-09 DIAGNOSIS — Z1211 Encounter for screening for malignant neoplasm of colon: Secondary | ICD-10-CM | POA: Diagnosis not present

## 2017-11-09 DIAGNOSIS — Z86010 Personal history of colonic polyps: Secondary | ICD-10-CM

## 2017-11-09 DIAGNOSIS — I2510 Atherosclerotic heart disease of native coronary artery without angina pectoris: Secondary | ICD-10-CM | POA: Diagnosis not present

## 2017-11-09 MED ORDER — SODIUM CHLORIDE 0.9 % IV SOLN: 500.0000 mL | INTRAVENOUS | Status: DC

## 2017-11-09 NOTE — Op Note (Signed)
[place] Patient Name: Heidi Wolf Procedure Date: 11/09/2017 11:04 AM MRN: [id number] Endoscopist: Metzger. Loletha Carrow , MD Age: 76 Referring MD:  Date of Birth: 1941-07-31 Gender: Female Account #: [id number] Procedure:                Colonoscopy Indications:              Surveillance: Personal history of adenomatous                            polyps on last colonoscopy > 5 years ago (TA,                            01/2012) Medicines:                Monitored Anesthesia Care Procedure:                Pre-Anesthesia Assessment:                           - Prior to the procedure, a History and Physical                            was performed, and patient medications and                            allergies were reviewed. The patient's tolerance of                            previous anesthesia was also reviewed. The risks                            and benefits of the procedure and the sedation                            options and risks were discussed with the patient.                            All questions were answered, and informed consent                            was obtained. Anticoagulants: The patient has taken                            aspirin. It was decided not to withhold this                            medication prior to the procedure. ASA Grade                            Assessment: III - A patient with severe systemic                            disease. After reviewing the risks and benefits,  the patient was deemed in satisfactory condition to                            undergo the procedure.                           After obtaining informed consent, the colonoscope                            was passed under direct vision. Throughout the                            procedure, the patient's blood pressure, pulse, and                            oxygen saturations were monitored continuously. The   Colonoscope was introduced through the anus and                            advanced to the the cecum, identified by                            appendiceal orifice and ileocecal valve. The                            colonoscopy was performed without difficulty. The                            patient tolerated the procedure well. The quality                            of the bowel preparation was excellent. The                            ileocecal valve, appendiceal orifice, and rectum                            were photographed. The quality of the bowel                            preparation was evaluated using the BBPS [facility]                            Bowel Preparation Scale) with scores of: Right                            Colon = 3, Transverse Colon = 3 and Left Colon = 3                            (entire mucosa seen well with no residual staining,                            small fragments of stool or opaque liquid). The  total BBPS score equals 9. The bowel preparation                            used was SUPREP. Scope In: 11:15:57 AM Scope Out: 11:30:03 AM Scope Withdrawal Time: 0 hours 9 minutes 44 seconds  Total Procedure Duration: 0 hours 14 minutes 6 seconds  Findings:                 The digital rectal exam findings include decreased                            sphincter tone and a prolapsed anal papilla.                           A few small-mouthed diverticula were found in the                            left colon.                           The sigmoid colon was moderately redundant.                           The exam was otherwise without abnormality on                            direct and retroflexion views. Complications:            No immediate complications. Estimated Blood Loss:     Estimated blood loss: none. Impression:               - Decreased sphincter tone and a prolapsed anal                            papilla. found on digital  rectal exam.                           - Diverticulosis in the left colon.                           - Redundant colon.                           - The examination was otherwise normal on direct                            and retroflexion views.                           - No specimens collected. Recommendation:           - Patient has a contact number available for                            emergencies. The signs and symptoms of potential  delayed complications were discussed with the                            patient. Return to normal activities tomorrow.                            Written discharge instructions were provided to the                            patient.                           - Resume previous diet.                           - Continue present medications.                           - No repeat screening colonoscopy necessary due to                            age. Henry L. Loletha Carrow, MD 11/09/2017 11:33:46 AM This report has been signed electronically.

## 2017-11-09 NOTE — Patient Instructions (Signed)
YOU HAD AN ENDOSCOPIC PROCEDURE TODAY AT [facility]:   Refer to the procedure report that was given to you for any specific questions about what was found during the examination.  If the procedure report does not answer your questions, please call your gastroenterologist to clarify.  If you requested that your care partner not be given the details of your procedure findings, then the procedure report has been included in a sealed envelope for you to review at your convenience later.  YOU SHOULD EXPECT: Some feelings of bloating in the abdomen. Passage of more gas than usual.  Walking can help get rid of the air that was put into your GI tract during the procedure and reduce the bloating. If you had a lower endoscopy (such as a colonoscopy or flexible sigmoidoscopy) you may notice spotting of blood in your stool or on the toilet paper. If you underwent a bowel prep for your procedure, you may not have a normal bowel movement for a few days.  Please Note:  You might notice some irritation and congestion in your nose or some drainage.  This is from the oxygen used during your procedure.  There is no need for concern and it should clear up in a day or so.  SYMPTOMS TO REPORT IMMEDIATELY:   Following lower endoscopy (colonoscopy or flexible sigmoidoscopy):  Excessive amounts of blood in the stool  Significant tenderness or worsening of abdominal pains  Swelling of the abdomen that is new, acute  Fever of 100F or higher  For urgent or emergent issues, a gastroenterologist can be reached at any hour by calling [phone].   DIET:  We do recommend a small meal at first, but then you may proceed to your regular diet.  Drink plenty of fluids but you should avoid alcoholic beverages for 24 hours.  ACTIVITY:  You should plan to take it easy for the rest of today and you should NOT DRIVE or use heavy machinery until tomorrow (because of the sedation medicines used during the test).     FOLLOW UP: Our staff will call the number listed on your records the next business day following your procedure to check on you and address any questions or concerns that you may have regarding the information given to you following your procedure. If we do not reach you, we will leave a message.  However, if you are feeling well and you are not experiencing any problems, there is no need to return our call.  We will assume that you have returned to your regular daily activities without incident.  If any biopsies were taken you will be contacted by phone or by letter within the next 1-3 weeks.  Please call us at [phone] if you have not heard about the biopsies in 3 weeks.   Hemorrhoids (handout given) Diverticulosis (handout given) No repeat screening colonoscopy necessary due to age  SIGNATURES/CONFIDENTIALITY: You and/or your care partner have signed paperwork which will be entered into your electronic medical record.  These signatures attest to the fact that that the information above on your After Visit Summary has been reviewed and is understood.  Full responsibility of the confidentiality of this discharge information lies with you and/or your care-partner.

## 2017-11-09 NOTE — Progress Notes (Signed)
A/ox3 pleased with MAC, report to [facility]

## 2017-11-10 ENCOUNTER — Telehealth: Payer: Self-pay | Admitting: *Deleted

## 2017-11-10 NOTE — Telephone Encounter (Signed)
  Follow up Call-  Call back number 11/09/2017  Post procedure Call Back phone  # [phone]  Permission to leave phone message Yes  Some recent data might be hidden     Patient questions:  Do you have a fever, pain , or abdominal swelling? No. Pain Score  0 *  Have you tolerated food without any problems? Yes.    Have you been able to return to your normal activities? Yes.    Do you have any questions about your discharge instructions: Diet   No. Medications  No. Follow up visit  No.  Do you have questions or concerns about your Care? No.  Actions: * If pain score is 4 or above: No action needed, pain <4.

## 2017-11-16 ENCOUNTER — Telehealth: Payer: Self-pay | Admitting: Cardiology

## 2017-11-16 MED ORDER — AMLODIPINE BESYLATE 5 MG PO TABS: 5.0000 mg | ORAL_TABLET | Freq: Every day | ORAL | 3 refills | Status: DC

## 2017-11-16 MED ORDER — METOPROLOL TARTRATE 25 MG PO TABS: 12.5000 mg | ORAL_TABLET | Freq: Two times a day (BID) | ORAL | 3 refills | Status: DC

## 2017-11-16 NOTE — Telephone Encounter (Signed)
Refill sent to the pharmacy electronically.  

## 2017-11-16 NOTE — Telephone Encounter (Signed)
°*  STAT* If patient is at the pharmacy, call can be transferred to refill team.   1. Which medications need to be refilled? (please list name of each medication and dose if known) need a new prescription for Metoprolol-dose changed-also needs her Amlodipine 2. Which pharmacy/location (including street and city if local pharmacy) is medication to be sent to?[facility][phone] 3. Do they need a 30 day or 90 day supply?90 and refills

## 2017-11-27 ENCOUNTER — Ambulatory Visit
Admission: RE | Admit: 2017-11-27 | Discharge: 2017-11-27 | Disposition: A | Discharge status: Home / Self Care | Payer: Medicare Other | Source: Ambulatory Visit | Attending: Family Medicine | Admitting: Family Medicine

## 2017-11-27 DIAGNOSIS — Z1231 Encounter for screening mammogram for malignant neoplasm of breast: Secondary | ICD-10-CM | POA: Diagnosis not present

## 2017-12-03 DIAGNOSIS — H2513 Age-related nuclear cataract, bilateral: Secondary | ICD-10-CM | POA: Diagnosis not present

## 2017-12-03 DIAGNOSIS — E119 Type 2 diabetes mellitus without complications: Secondary | ICD-10-CM | POA: Diagnosis not present

## 2017-12-03 DIAGNOSIS — H401131 Primary open-angle glaucoma, bilateral, mild stage: Secondary | ICD-10-CM | POA: Diagnosis not present

## 2017-12-23 DIAGNOSIS — K219 Gastro-esophageal reflux disease without esophagitis: Secondary | ICD-10-CM | POA: Insufficient documentation

## 2017-12-23 DIAGNOSIS — I2510 Atherosclerotic heart disease of native coronary artery without angina pectoris: Secondary | ICD-10-CM | POA: Insufficient documentation

## 2017-12-23 DIAGNOSIS — F419 Anxiety disorder, unspecified: Secondary | ICD-10-CM | POA: Insufficient documentation

## 2017-12-23 DIAGNOSIS — E119 Type 2 diabetes mellitus without complications: Secondary | ICD-10-CM | POA: Insufficient documentation

## 2017-12-23 DIAGNOSIS — T7840XA Allergy, unspecified, initial encounter: Secondary | ICD-10-CM | POA: Insufficient documentation

## 2017-12-23 DIAGNOSIS — D649 Anemia, unspecified: Secondary | ICD-10-CM | POA: Insufficient documentation

## 2017-12-23 DIAGNOSIS — K644 Residual hemorrhoidal skin tags: Secondary | ICD-10-CM | POA: Insufficient documentation

## 2017-12-23 DIAGNOSIS — M1990 Unspecified osteoarthritis, unspecified site: Secondary | ICD-10-CM | POA: Insufficient documentation

## 2017-12-23 DIAGNOSIS — E785 Hyperlipidemia, unspecified: Secondary | ICD-10-CM | POA: Insufficient documentation

## 2017-12-23 DIAGNOSIS — K648 Other hemorrhoids: Secondary | ICD-10-CM | POA: Insufficient documentation

## 2017-12-23 DIAGNOSIS — H269 Unspecified cataract: Secondary | ICD-10-CM | POA: Insufficient documentation

## 2017-12-23 DIAGNOSIS — I219 Acute myocardial infarction, unspecified: Secondary | ICD-10-CM | POA: Insufficient documentation

## 2017-12-23 DIAGNOSIS — K635 Polyp of colon: Secondary | ICD-10-CM | POA: Insufficient documentation

## 2018-01-11 ENCOUNTER — Ambulatory Visit: Payer: Medicare Other | Admitting: Cardiology

## 2018-01-27 ENCOUNTER — Other Ambulatory Visit: Payer: Self-pay | Admitting: Cardiology

## 2018-01-27 ENCOUNTER — Other Ambulatory Visit: Payer: Self-pay

## 2018-01-27 MED ORDER — POTASSIUM CHLORIDE ER 20 MEQ PO TBCR: 20.0000 meq | EXTENDED_RELEASE_TABLET | Freq: Every day | ORAL | 1 refills | Status: DC

## 2018-02-01 ENCOUNTER — Other Ambulatory Visit: Payer: Self-pay

## 2018-02-01 MED ORDER — POTASSIUM CHLORIDE CRYS ER 20 MEQ PO TBCR: 20.0000 meq | EXTENDED_RELEASE_TABLET | Freq: Every day | ORAL | 3 refills | Status: DC

## 2018-02-06 NOTE — Progress Notes (Signed)
Cardiology Office Note   Date:  02/08/2018   ID:  Theophilus Bones, DOB [birth year], MRN [id number]  PCP:  Rogue Bussing, MD  Cardiologist:   Minus Breeding, MD   Chief Complaint  Patient presents with  . Coronary Artery Disease      History of Present Illness: Heidi Wolf is a 77 y.o. female who presents for follow up of CAD s/p Dx PCI/ DES 07/03/16. Re look 07/15/16 showed patent stent. She had no other significant CAD and LV wall motion was normal at cath. She complained of intermittent palpitations. A Holter monitor showed PACs and her beta blocker was increased.   She was in the ED in 9/18.  We saw her in consult and she was sent home after treatment of non anginal chest pain.   She was seen in follow up in the office in Nov.   She had bradycardia and her beta blocker was reduced.     Since I last saw her she is done relatively well.  She will get occasional rapid palpitations at night when she rolls over and they go away.  She has not had any chest pressure, neck or arm discomfort.  She is trying to watch her diet.  She is not exercising as much as I would like.  She goes to [facility] twice a month or so to exercise.     Past Medical History:  Diagnosis Date  . Allergy   . Anemia   . Anxiety   . Arthritis    "right hip" (09/21/2014)  . CAD (coronary artery disease)    PCI 90% diagonal stenosis witha DES July 2017.  Nonobstructive disease elsewhere.   . Cataracts, both eyes   . Colon polyps    Hyperplastic 2003  . External hemorrhoid   . GERD (gastroesophageal reflux disease)   . Glaucoma   . Hyperlipidemia   . Hypertension   . Internal hemorrhoid   . Myocardial infarction ([place])   . Personal history of colonic polyps 03/03/2012  . Type II diabetes mellitus ([place])    type 2    Past Surgical History:  Procedure Laterality Date  . ABDOMINAL HYSTERECTOMY    . APPENDECTOMY     "w/gallbladder"  . CARDIAC CATHETERIZATION  X 2  . CARDIAC CATHETERIZATION N/A 07/03/2016     Procedure: Left Heart Cath and Coronary Angiography;  Surgeon: Dixie Dials, MD;  Location: [facility];  Service: Cardiovascular;  Laterality: N/A;  . CARDIAC CATHETERIZATION N/A 07/03/2016   Procedure: Coronary Stent Intervention;  Surgeon: Jettie Booze, MD;  Location: [place] [facility];  Service: Cardiovascular;  Laterality: N/A;  . CARDIAC CATHETERIZATION N/A 07/15/2016   Procedure: Left Heart Cath and Coronary Angiography;  Surgeon: Dixie Dials, MD;  Location: [place] [facility];  Service: Cardiovascular;  Laterality: N/A;  . CHOLECYSTECTOMY    . COLONOSCOPY    . CORONARY ANGIOPLASTY WITH STENT PLACEMENT  06/2016  . TUBAL LIGATION  1971     Current Outpatient Medications  Medication Sig Dispense Refill  . acetaminophen (TYLENOL) 325 MG tablet Take 650 mg by mouth every 6 (six) hours as needed for headache.    Marland Kitchen amLODipine (NORVASC) 5 MG tablet Take 1 tablet (5 mg total) daily by mouth. 90 tablet 3  . aspirin EC 81 MG tablet Take 1 tablet (81 mg total) by mouth daily.    Marland Kitchen atorvastatin (LIPITOR) 80 MG tablet Take 1 tablet (80 mg total) by mouth every  evening. 90 tablet 3  . benazepril (LOTENSIN) 20 MG tablet Take 1 tablet (20 mg total) by mouth daily. 90 tablet 3  . cholecalciferol (VITAMIN D) 1000 UNITS tablet Take 1,000 Units by mouth every other day.     Marland Kitchen glucose blood (ACCU-CHEK AVIVA PLUS) test strip Use as instructed 100 each 12  . loratadine (CLARITIN) 10 MG tablet Take 10 mg by mouth daily as needed for allergies.     Marland Kitchen LUMIGAN 0.01 % SOLN Place 1 drop into both eyes at bedtime.     . metoprolol tartrate (LOPRESSOR) 25 MG tablet Take 0.5 tablets (12.5 mg total) 2 (two) times daily by mouth. 45 tablet 3  . nitroGLYCERIN (NITROSTAT) 0.4 MG SL tablet PLACE 1 TABLET UNDER TONGUE EVERY 5 MINUTES AS NEEDED CHEST PAIN, SEEK MEDICAL ATTENTION IF NO RELIEF AFTER 2ND DOSE 25 tablet 1  . pantoprazole (PROTONIX) 40 MG tablet Take 1 tablet (40 mg total) by mouth daily. 90  tablet 3  . potassium chloride SA (K-DUR,KLOR-CON) 20 MEQ tablet Take 1 tablet (20 mEq total) by mouth daily. 90 tablet 3   No current facility-administered medications for this visit.     Allergies:   Aspirin   ROS:  Please see the history of present illness.   Otherwise, review of systems are positive none.   All other systems are reviewed and negative.    PHYSICAL EXAM: VS:  BP 140/90   Pulse (!) 49   Ht 5\' 4"  (1.626 m)   Wt 164 lb (74.4 kg)   BMI 28.15 kg/m  , BMI Body mass index is 28.15 kg/m.  GENERAL:  Well appearing NECK:  No jugular venous distention, waveform within normal limits, carotid upstroke brisk and symmetric, no bruits, no thyromegaly LUNGS:  Clear to auscultation bilaterally CHEST:  Unremarkable HEART:  PMI not displaced or sustained,S1 and S2 within normal limits, no S3, no S4, no clicks, no rubs, no murmurs ABD:  Flat, positive bowel sounds normal in frequency in pitch, no bruits, no rebound, no guarding, no midline pulsatile mass, no hepatomegaly, no splenomegaly EXT:  2 plus pulses throughout, no edema, no cyanosis no clubbing    GENERAL:  Well appearing NECK:  No jugular venous distention, waveform within normal limits, carotid upstroke brisk and symmetric, no bruits, no thyromegaly LUNGS:  Clear to auscultation bilaterally CHEST:  Unremarkable HEART:  PMI not displaced or sustained,S1 and S2 within normal limits, no S3, no S4, no clicks, no rubs, 2 out of 6 soft apical systolic murmur brief nonradiating, no diastolic murmurs ABD:  Flat, positive bowel sounds normal in frequency in pitch, no bruits, no rebound, no guarding, no midline pulsatile mass, no hepatomegaly, no splenomegaly EXT:  2 plus pulses throughout, no edema, no cyanosis no clubbing   EKG:  EKG is  not ordered today.   Recent Labs: 09/15/2017: BUN 16; Creatinine, Ser 1.20; Hemoglobin 13.2; Platelets 260; Potassium 4.0; Sodium 135 09/30/2017: ALT 23    Lipid Panel    Component Value  Date/Time   CHOL 96 (L) 09/18/2017 1016   TRIG 76 09/18/2017 1016   HDL 41 09/18/2017 1016   CHOLHDL 2.3 09/18/2017 1016   CHOLHDL 2.6 07/15/2016 0111   VLDL 20 07/15/2016 0111   LDLCALC 40 09/18/2017 1016     Lab Results  Component Value Date   HGBA1C 6.1 11/03/2017    Wt Readings from Last 3 Encounters:  02/08/18 164 lb (74.4 kg)  11/09/17 163 lb (73.9 kg)  11/03/17 165  lb 3.2 oz (74.9 kg)      Other studies Reviewed: Additional studies/ records that were reviewed today include: Labs Review of the above records demonstrates:     ASSESSMENT AND PLAN:  CAD:  The patient has no new sypmtoms.  No further cardiovascular testing is indicated.  We will continue with aggressive risk reduction and meds as listed.  PALPITATIONS:    These are not particularly problematic.  No change in therapy is indicated.  HTN:   The blood pressure is at target.  No change in therapy.   DM:   A1C as above.  She will continue with meds as listed.  HYPERLIPIDEMIA:  She had a target lipids as above.  No change in therapy.   RISK REDUCTION:  She is not exercising and I will refer her to our Care Guide to explore this.     Current medicines are reviewed at length with the patient today.  The patient does not have concerns regarding medicines.  The following changes have been made:   None  Labs/ tests ordered today include:  None  No orders of the defined types were placed in this encounter.    Disposition:   FU with me in 6  months.     Signed, Minus Breeding, MD  02/08/2018 10:33 AM    [facility]

## 2018-02-08 ENCOUNTER — Ambulatory Visit (INDEPENDENT_AMBULATORY_CARE_PROVIDER_SITE_OTHER): Payer: Medicare Other | Admitting: Cardiology

## 2018-02-08 ENCOUNTER — Encounter: Payer: Self-pay | Admitting: Cardiology

## 2018-02-08 VITALS — BP 140/90 | HR 49 | Ht 64.0 in | Wt 164.0 lb

## 2018-02-08 DIAGNOSIS — I10 Essential (primary) hypertension: Secondary | ICD-10-CM | POA: Diagnosis not present

## 2018-02-08 DIAGNOSIS — R002 Palpitations: Secondary | ICD-10-CM | POA: Diagnosis not present

## 2018-02-08 DIAGNOSIS — I2510 Atherosclerotic heart disease of native coronary artery without angina pectoris: Secondary | ICD-10-CM

## 2018-02-08 DIAGNOSIS — E118 Type 2 diabetes mellitus with unspecified complications: Secondary | ICD-10-CM

## 2018-02-08 NOTE — Patient Instructions (Addendum)
Medication Instructions:  Continue current medications  If you need a refill on your cardiac medications before your next appointment, please call your pharmacy.  Labwork: None Ordered   Testing/Procedures: None Ordered  Follow-Up: Your physician wants you to follow-up in: 1 Year. You should receive a reminder letter in the mail two months in advance. If you do not receive a letter, please call our office [phone].    Thank you for choosing [facility]!!      

## 2018-02-24 ENCOUNTER — Other Ambulatory Visit: Payer: Self-pay | Admitting: Internal Medicine

## 2018-03-02 ENCOUNTER — Telehealth: Payer: Self-pay

## 2018-03-02 NOTE — Telephone Encounter (Signed)
Pt called nurse line, states her insurance no longer covering Accu-check aviva test strips. Based on her insurance it looks like she will need the one touch verio device and strips called in. [place] Pt call back [phone] Wallace Cullens, RN

## 2018-03-03 ENCOUNTER — Telehealth: Payer: Self-pay | Admitting: Internal Medicine

## 2018-03-03 MED ORDER — ONETOUCH DELICA LANCETS 33G MISC: 1.0000 | Freq: Every day | 3 refills | Status: DC | PRN

## 2018-03-03 MED ORDER — GLUCOSE BLOOD VI STRP: ORAL_STRIP | 3 refills | Status: DC

## 2018-03-03 MED ORDER — ONETOUCH VERIO W/DEVICE KIT
1.0000 | PACK | Freq: Every day | 0 refills | Status: DC
Start: 2018-03-03 — End: 2018-04-29

## 2018-03-03 NOTE — Telephone Encounter (Signed)
Placed orders

## 2018-03-03 NOTE — Telephone Encounter (Signed)
Tried calling patient to check and see if she had picked up her RX for glucose test strips. There was no answer and there was no voice mail.Ozella Almond, CMA

## 2018-03-03 NOTE — Telephone Encounter (Signed)
Pt is calling because the pharmacy told her that they didn't receive the new prescription that was sent in today for the OneTouch Verio test strips, lancets, and the monitor. Can we call the pharmacy and make they received this and or re-send this again for the patient so she can pick up this afternoon. jw

## 2018-03-04 DIAGNOSIS — H401131 Primary open-angle glaucoma, bilateral, mild stage: Secondary | ICD-10-CM | POA: Diagnosis not present

## 2018-03-05 ENCOUNTER — Telehealth: Payer: Self-pay | Admitting: Cardiology

## 2018-03-08 NOTE — Telephone Encounter (Signed)
Reached out to pt to schedule initial care guide apt. Pt says she feels she is active enough and does well for her age. When asked if there were any there goals, or anything that I can assist with. Pt says she does need transportation for visits at times. Will reach out to her medicare contact to schedule. Provided pt with contact information if she thinks of anything else.

## 2018-03-26 ENCOUNTER — Emergency Department (HOSPITAL_COMMUNITY)
Admission: EM | Admit: 2018-03-26 | Discharge: 2018-03-26 | Disposition: A | Discharge status: Home / Self Care | Payer: Medicare Other | Attending: Emergency Medicine | Admitting: Emergency Medicine

## 2018-03-26 ENCOUNTER — Other Ambulatory Visit: Payer: Self-pay

## 2018-03-26 ENCOUNTER — Encounter (HOSPITAL_COMMUNITY): Payer: Self-pay | Admitting: Emergency Medicine

## 2018-03-26 ENCOUNTER — Telehealth: Payer: Self-pay | Admitting: Cardiology

## 2018-03-26 ENCOUNTER — Emergency Department (HOSPITAL_COMMUNITY): Payer: Medicare Other

## 2018-03-26 DIAGNOSIS — Z79899 Other long term (current) drug therapy: Secondary | ICD-10-CM | POA: Diagnosis not present

## 2018-03-26 DIAGNOSIS — R0602 Shortness of breath: Secondary | ICD-10-CM | POA: Diagnosis not present

## 2018-03-26 DIAGNOSIS — M542 Cervicalgia: Secondary | ICD-10-CM | POA: Diagnosis not present

## 2018-03-26 DIAGNOSIS — I10 Essential (primary) hypertension: Secondary | ICD-10-CM | POA: Insufficient documentation

## 2018-03-26 DIAGNOSIS — E119 Type 2 diabetes mellitus without complications: Secondary | ICD-10-CM | POA: Insufficient documentation

## 2018-03-26 DIAGNOSIS — R0789 Other chest pain: Secondary | ICD-10-CM | POA: Diagnosis not present

## 2018-03-26 DIAGNOSIS — F419 Anxiety disorder, unspecified: Secondary | ICD-10-CM | POA: Insufficient documentation

## 2018-03-26 DIAGNOSIS — I2510 Atherosclerotic heart disease of native coronary artery without angina pectoris: Secondary | ICD-10-CM | POA: Diagnosis not present

## 2018-03-26 DIAGNOSIS — M5412 Radiculopathy, cervical region: Secondary | ICD-10-CM | POA: Diagnosis not present

## 2018-03-26 DIAGNOSIS — Z7982 Long term (current) use of aspirin: Secondary | ICD-10-CM | POA: Insufficient documentation

## 2018-03-26 DIAGNOSIS — I252 Old myocardial infarction: Secondary | ICD-10-CM | POA: Insufficient documentation

## 2018-03-26 DIAGNOSIS — R079 Chest pain, unspecified: Secondary | ICD-10-CM | POA: Diagnosis not present

## 2018-03-26 DIAGNOSIS — Z955 Presence of coronary angioplasty implant and graft: Secondary | ICD-10-CM | POA: Diagnosis not present

## 2018-03-26 DIAGNOSIS — Z9049 Acquired absence of other specified parts of digestive tract: Secondary | ICD-10-CM | POA: Diagnosis not present

## 2018-03-26 LAB — I-STAT TROPONIN, ED
Troponin i, poc: 0.01 ng/mL (ref 0.00–0.08)
Troponin i, poc: 0.01 ng/mL (ref 0.00–0.08)

## 2018-03-26 LAB — CBC
HCT: 39.9 % (ref 36.0–46.0)
Hemoglobin: 12.9 g/dL (ref 12.0–15.0)
MCH: 26.8 pg (ref 26.0–34.0)
MCHC: 32.3 g/dL (ref 30.0–36.0)
MCV: 83 fL (ref 78.0–100.0)
Platelets: 256 10*3/uL (ref 150–400)
RBC: 4.81 MIL/uL (ref 3.87–5.11)
RDW: 13.8 % (ref 11.5–15.5)
WBC: 8.8 10*3/uL (ref 4.0–10.5)

## 2018-03-26 LAB — BASIC METABOLIC PANEL
Anion gap: 8 (ref 5–15)
BUN: 16 mg/dL (ref 6–20)
CO2: 22 mmol/L (ref 22–32)
Calcium: 9 mg/dL (ref 8.9–10.3)
Chloride: 106 mmol/L (ref 101–111)
Creatinine, Ser: 1.1 mg/dL — ABNORMAL HIGH (ref 0.44–1.00)
GFR calc Af Amer: 55 mL/min — ABNORMAL LOW (ref >60)
GFR calc non Af Amer: 48 mL/min — ABNORMAL LOW (ref >60)
Glucose, Bld: 133 mg/dL — ABNORMAL HIGH (ref 65–99)
Potassium: 3.6 mmol/L (ref 3.5–5.1)
Sodium: 136 mmol/L (ref 135–145)

## 2018-03-26 MED ORDER — ACETAMINOPHEN 500 MG PO TABS
1000.0000 mg | ORAL_TABLET | Freq: Once | ORAL | Status: AC
  Administered 2018-03-26: 1000 mg via ORAL
  Filled 2018-03-26: qty 2

## 2018-03-26 MED ORDER — DEXAMETHASONE SODIUM PHOSPHATE 10 MG/ML IJ SOLN
10.0000 mg | Freq: Once | INTRAMUSCULAR | Status: AC
  Administered 2018-03-26: 10 mg via INTRAVENOUS
  Filled 2018-03-26: qty 1

## 2018-03-26 MED ORDER — MORPHINE SULFATE (PF) 4 MG/ML IV SOLN
4.0000 mg | Freq: Once | INTRAVENOUS | Status: DC
  Filled 2018-03-26: qty 1

## 2018-03-26 MED ORDER — PREDNISONE 10 MG (21) PO TBPK: ORAL_TABLET | ORAL | 0 refills | Status: DC

## 2018-03-26 MED ORDER — ONDANSETRON HCL 4 MG/2ML IJ SOLN
4.0000 mg | Freq: Once | INTRAMUSCULAR | Status: DC
  Filled 2018-03-26: qty 2

## 2018-03-26 NOTE — ED Triage Notes (Signed)
Patient reports central chest pressure/tightness onset yesterday with left neck and left shoulder pain , mild SOB , no nausea or diaphoresis .

## 2018-03-26 NOTE — ED Provider Notes (Signed)
[facility] EMERGENCY DEPARTMENT Provider Note   CSN: [id number] Arrival date & time: 03/26/18  0121     History   Chief Complaint Chief Complaint  Patient presents with  . Chest Pain    HPI Heidi Wolf is a 77 y.o. female.  Pt presents to the ED today with cp and left sided neck pain.  Pt said neck pain started yesterday and radiated down her left arm.  She also has some central cp that started at the same time.  She denies sob.  No n/v.  She came in early this morning when she could not sleep due to the pain.  She has a hx of a stent and was concerned it was her heart.  She did take a total of 3 nitros, but they did not help sx.     Past Medical History:  Diagnosis Date  . Allergy   . Anemia   . Anxiety   . Arthritis    "right hip" (09/21/2014)  . CAD (coronary artery disease)    PCI 90% diagonal stenosis witha DES July 2017.  Nonobstructive disease elsewhere.   . Cataracts, both eyes   . Colon polyps    Hyperplastic 2003  . External hemorrhoid   . GERD (gastroesophageal reflux disease)   . Glaucoma   . Hyperlipidemia   . Hypertension   . Internal hemorrhoid   . Myocardial infarction ([place])   . Personal history of colonic polyps 03/03/2012  . Type II diabetes mellitus ([place])    type 2    Patient Active Problem List   Diagnosis Date Noted  . Type II diabetes mellitus ([place])   . Myocardial infarction ([place])   . Internal hemorrhoid   . Hypertension   . Hyperlipidemia   . GERD (gastroesophageal reflux disease)   . External hemorrhoid   . Colon polyps   . Cataracts, both eyes   . CAD (coronary artery disease)   . Arthritis   . Anxiety   . Anemia   . Allergy   . Fatigue 05/21/2017  . Essential hypertension 09/03/2016  . Dyslipidemia 09/03/2016  . 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 Palpitations 09/03/2016  . Chest pain 07/03/2016  . CAD- S/P PCI July 2017 07/02/2016  . 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 Chest pain at rest 01/17/2014  . Personal history of colonic polyps 03/03/2012  . Internal hemorrhoids without mention of complication 51/76/1607    Past Surgical History:  Procedure Laterality Date  . ABDOMINAL HYSTERECTOMY    . APPENDECTOMY     "w/gallbladder"  . CARDIAC CATHETERIZATION  X 2  . CARDIAC CATHETERIZATION N/A 07/03/2016   Procedure: Left Heart Cath and Coronary Angiography;  Surgeon: Dixie Dials, MD;  Location: [facility];  Service: Cardiovascular;  Laterality: N/A;  . CARDIAC CATHETERIZATION N/A 07/03/2016   Procedure: Coronary Stent Intervention;  Surgeon: Jettie Booze, MD;  Location: [facility];  Service: Cardiovascular;  Laterality: N/A;  . CARDIAC CATHETERIZATION N/A 07/15/2016   Procedure: Left Heart Cath and Coronary Angiography;  Surgeon: Dixie Dials, MD;  Location: [facility];  Service: Cardiovascular;  Laterality: N/A;  . CHOLECYSTECTOMY    . COLONOSCOPY    . CORONARY ANGIOPLASTY WITH STENT PLACEMENT  06/2016  . TUBAL LIGATION  1971     OB History   None      Home Medications    Prior to Admission  medications   Medication Sig Start Date End Date Taking? Authorizing Provider  acetaminophen (TYLENOL) 325 MG tablet Take 650 mg by mouth every 6 (six) hours as needed for headache.   Yes [provider]  amLODipine (NORVASC) 5 MG tablet Take 1 tablet (5 mg total) daily by mouth. 11/16/17  Yes Minus Breeding, MD  aspirin EC 81 MG tablet Take 1 tablet (81 mg total) by mouth daily. 07/04/16  Yes Dixie Dials, MD  atorvastatin (LIPITOR) 80 MG tablet Take 1 tablet (80 mg total) by mouth every evening. 07/15/17  Yes Minus Breeding, MD  benazepril (LOTENSIN) 20 MG tablet Take 1 tablet (20 mg total) by mouth daily. 08/11/17  Yes Minus Breeding, MD  cholecalciferol (VITAMIN D) 1000 UNITS tablet Take 1,000 Units by mouth every other day.    Yes [provider]  loratadine (CLARITIN) 10 MG tablet  Take 10 mg by mouth daily as needed for allergies.    Yes [provider]  LUMIGAN 0.01 % SOLN Place 1 drop into both eyes at bedtime.  04/13/14  Yes [provider]  metoprolol tartrate (LOPRESSOR) 25 MG tablet Take 0.5 tablets (12.5 mg total) 2 (two) times daily by mouth. 11/16/17  Yes Minus Breeding, MD  nitroGLYCERIN (NITROSTAT) 0.4 MG SL tablet PLACE 1 TABLET UNDER TONGUE EVERY 5 MINUTES AS NEEDED CHEST PAIN, SEEK MEDICAL ATTENTION IF NO RELIEF AFTER 2ND DOSE 07/06/17  Yes Minus Breeding, MD  pantoprazole (PROTONIX) 40 MG tablet Take 1 tablet (40 mg total) by mouth daily. 08/11/17  Yes Minus Breeding, MD  potassium chloride SA (K-DUR,KLOR-CON) 20 MEQ tablet Take 1 tablet (20 mEq total) by mouth daily. 02/01/18  Yes Minus Breeding, MD  trolamine salicylate (ASPERCREME) 10 % cream Apply 1 application topically as needed for muscle pain.   Yes [provider]  Blood Glucose Monitoring Suppl (ONETOUCH VERIO) w/Device KIT 1 Device by Does not apply route daily. 03/03/18   Rogue Bussing, MD  glucose blood [facility] VERIO) test strip Test daily as needed. 03/03/18   Rogue Bussing, MD  [facility] DELICA LANCETS 31V MISC 1 Device by Does not apply route daily as needed. 03/03/18   Rogue Bussing, MD  predniSONE (STERAPRED UNI-PAK 21 TAB) 10 MG (21) TBPK tablet Take 6 tabs by mouth daily  for 2 days, then 5 tabs for 2 days, then 4 tabs for 2 days, then 3 tabs for 2 days, 2 tabs for 2 days, then 1 tab by mouth daily for 2 days 03/26/18   Isla Pence, MD    Family History Family History  Problem Relation Age of Onset  . Diabetes Mother   . Arthritis Mother   . CAD Mother 67       CABG  . CAD Brother   . CAD Sister 75       CABG  . Stroke Brother   . Stroke Daughter   . Diabetes Maternal Grandmother   . Stroke Maternal Grandmother   . Stroke Father   . Breast cancer Unknown        niece  . Heart failure Sister   . Kidney failure Sister   .  Colon cancer Neg Hx   . Stomach cancer Neg Hx   . Esophageal cancer Neg Hx   . Pancreatic cancer Neg Hx   . Rectal cancer Neg Hx     Social History Social History   Tobacco Use  . Smoking status: Never Smoker  . Smokeless tobacco: Never Used  Substance Use Topics  . Alcohol use: No  . Drug use: No     Allergies   Aspirin   Review of Systems Review of Systems  Cardiovascular: Positive for chest pain.  Musculoskeletal: Positive for neck pain.  All other systems reviewed and are negative.    Physical Exam Updated Vital Signs BP (!) 143/75   Pulse 60   Temp 98.7 F (37.1 C) (Oral)   Resp 16   SpO2 97%   Physical Exam  Constitutional: She is oriented to person, place, and time. She appears well-developed and well-nourished.  HENT:  Head: Normocephalic and atraumatic.  Eyes: Pupils are equal, round, and reactive to light. EOM are normal.  Neck: Normal range of motion. Neck supple. Muscular tenderness present.  Left sided muscular tenderness  Cardiovascular: Normal rate, regular rhythm, intact distal pulses and normal pulses.  Pulmonary/Chest: Effort normal and breath sounds normal.  Abdominal: Soft. Bowel sounds are normal.  Musculoskeletal: Normal range of motion.       Right lower leg: Normal.       Left lower leg: Normal.  Neurological: She is alert and oriented to person, place, and time.  Skin: Skin is warm and dry. Capillary refill takes less than 2 seconds.  Psychiatric: She has a normal mood and affect. Her behavior is normal.  Nursing note and vitals reviewed.    ED Treatments / Results  Labs (all labs ordered are listed, but only abnormal results are displayed) Labs Reviewed  BASIC METABOLIC PANEL - Abnormal; Notable for the following components:      Result Value   Glucose, Bld 133 (*)    Creatinine, Ser 1.10 (*)    GFR calc non Af Amer 48 (*)    GFR calc Af Amer 55 (*)    All other components within normal limits  CBC  I-STAT TROPONIN, ED    I-STAT TROPONIN, ED    EKG EKG Interpretation  Date/Time:  Friday March 26 2018 01:24:37 EDT Ventricular Rate:  59 PR Interval:  168 QRS Duration: 86 QT Interval:  422 QTC Calculation: 417 R Axis:   60 Text Interpretation:  Sinus bradycardia with marked sinus arrhythmia Moderate voltage criteria for LVH, may be normal variant ST & T wave abnormality, consider inferior ischemia ST & T wave abnormality, consider anterolateral ischemia Abnormal ECG No significant change since last tracing Confirmed by Isla Pence [phone]) on 03/26/2018 9:28:39 AM   Radiology Dg Chest 2 View  Result Date: 03/26/2018 CLINICAL DATA:  77 year old female with chest pain and central chest pressure since yesterday. Pain radiating to the left neck and shoulder. Mild shortness of breath. EXAM: CHEST - 2 VIEW COMPARISON:  Chest radiographs 09/15/2017 and earlier. FINDINGS: Upright AP and lateral views of the chest. Stable lung volumes. Stable mild cardiomegaly. Other mediastinal contours are within normal limits. Azygos fissure (normal variant). No pneumothorax, pulmonary edema, pleural effusion or confluent pulmonary opacity. Stable visualized osseous structures. Stable cholecystectomy clips. Negative visible bowel gas pattern. IMPRESSION: No acute cardiopulmonary abnormality. Electronically Signed   By: Genevie Aunna M.D.   On: 03/26/2018 02:16   Ct Cervical Spine Wo Contrast  Result Date: 03/26/2018 CLINICAL DATA:  Sided neck, shoulder, arm and chest pain since last night. EXAM: CT CERVICAL SPINE WITHOUT CONTRAST TECHNIQUE: Multidetector CT imaging of the cervical spine was performed without intravenous contrast. Multiplanar CT image reconstructions were also generated. COMPARISON:  Cervical spine MRI dated 09/21/2014. FINDINGS: Alignment: Straightening of the normal cervical spine lordosis, likely related to  the mild degenerative changes in the mid and lower cervical spine. No evidence of acute vertebral body  subluxation. Skull base and vertebrae: No fracture line or displaced fracture fragment identified. No acute or suspicious osseous lesion. Soft tissues and spinal canal: No prevertebral fluid or swelling. No visible canal hematoma. Disc levels: Mild degenerative spurring throughout the cervical spine, with associated mild disc space narrowings and mild disc-osteophytic bulges in the mid and lower cervical spine. No more than mild central canal stenosis at any level. Upper chest: No acute findings. 17 mm hyperdense nodule within the right thyroid lobe, corresponding to a cystic-appearing nodule described on earlier MRI Other: Carotid atherosclerosis. IMPRESSION: 1. No acute findings. 2. Mild degenerative change within the mid and lower cervical spine, as detailed above, similar to appearance on MRI cervical spine dated 09/21/2014. 3. **An incidental finding of potential clinical significance has been found. 17 mm hyperdense nodule within the right thyroid lobe. Per consensus guidelines, nonemergent thyroid ultrasound is recommended for further characterization.** 4. Carotid atherosclerosis. Electronically Signed   By: Franki Cabot M.D.   On: 03/26/2018 10:08    Procedures Procedures (including critical care time)  Medications Ordered in ED Medications  morphine 4 MG/ML injection 4 mg (4 mg Intravenous Refused 03/26/18 1012)  ondansetron (ZOFRAN) injection 4 mg (4 mg Intravenous Refused 03/26/18 1012)  dexamethasone (DECADRON) injection 10 mg (10 mg Intravenous Given 03/26/18 1006)  acetaminophen (TYLENOL) tablet 1,000 mg (1,000 mg Oral Given 03/26/18 1035)     Initial Impression / Assessment and Plan / ED Course  I have reviewed the triage vital signs and the nursing notes.  Pertinent labs & imaging results that were available during my care of the patient were reviewed by me and considered in my medical decision making (see chart for details).    CP is atypical and pt has 2 negative troponins in 8  hours.  She has no ekg changes.  The pt's sx c/w cervical radiculopathy which she's had in the past.  She will be started on oral prednisone.  She knows this will increase her blood sugar and is encouraged to keep a close watch on that.  She knows to return if worse.  Final Clinical Impressions(s) / ED Diagnoses   Final diagnoses:  Atypical chest pain  Cervical radiculopathy    ED Discharge Orders        Ordered    predniSONE (STERAPRED UNI-PAK 21 TAB) 10 MG (21) TBPK tablet     03/26/18 1049       Isla Pence, MD 03/26/18 1228

## 2018-03-26 NOTE — Telephone Encounter (Signed)
Received call from patient who states she is in the ER and has been waiting since 3AM.  States she is in a lot of pain and does not know what to do.   States she has taken 3 NTG without relief and she is concerned.   Advised patient to speak with ER nurse and inform them of her symptoms/concerns. Reassured that EKG was completed, lab work and chest xray.   Patient aware and verbalized understanding.

## 2018-03-30 ENCOUNTER — Ambulatory Visit: Payer: Medicare Other | Admitting: Internal Medicine

## 2018-04-08 ENCOUNTER — Encounter: Payer: Self-pay | Admitting: Internal Medicine

## 2018-04-08 ENCOUNTER — Other Ambulatory Visit: Payer: Self-pay

## 2018-04-08 ENCOUNTER — Ambulatory Visit (INDEPENDENT_AMBULATORY_CARE_PROVIDER_SITE_OTHER): Payer: Medicare Other | Admitting: Internal Medicine

## 2018-04-08 VITALS — BP 146/78 | HR 58 | Temp 98.3°F | Wt 160.6 lb

## 2018-04-08 DIAGNOSIS — R0981 Nasal congestion: Secondary | ICD-10-CM

## 2018-04-08 DIAGNOSIS — I2510 Atherosclerotic heart disease of native coronary artery without angina pectoris: Secondary | ICD-10-CM | POA: Diagnosis not present

## 2018-04-08 DIAGNOSIS — M5489 Other dorsalgia: Secondary | ICD-10-CM | POA: Diagnosis not present

## 2018-04-08 DIAGNOSIS — E041 Nontoxic single thyroid nodule: Secondary | ICD-10-CM

## 2018-04-08 DIAGNOSIS — E1159 Type 2 diabetes mellitus with other circulatory complications: Secondary | ICD-10-CM

## 2018-04-08 DIAGNOSIS — I10 Essential (primary) hypertension: Secondary | ICD-10-CM

## 2018-04-08 LAB — POCT GLYCOSYLATED HEMOGLOBIN (HGB A1C): Hemoglobin A1C: 6.3

## 2018-04-08 MED ORDER — AMLODIPINE BESYLATE 5 MG PO TABS: 10.0000 mg | ORAL_TABLET | Freq: Every day | ORAL | 3 refills | Status: DC

## 2018-04-08 MED ORDER — ACETAMINOPHEN 325 MG PO TABS: 650.0000 mg | ORAL_TABLET | Freq: Three times a day (TID) | ORAL | Status: DC | PRN

## 2018-04-08 MED ORDER — POLYETHYLENE GLYCOL 3350 17 GM/SCOOP PO POWD: ORAL | 0 refills | Status: DC

## 2018-04-08 MED ORDER — FLUTICASONE PROPIONATE 50 MCG/ACT NA SUSP: 2.0000 | Freq: Every day | NASAL | 2 refills | Status: DC

## 2018-04-08 NOTE — Patient Instructions (Signed)
Heidi Wolf,  Please return in about 2 weeks to recheck blood pressure.  Call to see if Dr. Percival Spanish can check in with you sooner (this summer).  You can take 650 mg tylenol three times daily for muscle pain.  Use flonase for nasal congestion.  Try miralax for constipation.  Best, Dr. Ola Spurr

## 2018-04-09 ENCOUNTER — Encounter: Payer: Self-pay | Admitting: Internal Medicine

## 2018-04-09 DIAGNOSIS — M549 Dorsalgia, unspecified: Secondary | ICD-10-CM | POA: Insufficient documentation

## 2018-04-09 DIAGNOSIS — E041 Nontoxic single thyroid nodule: Secondary | ICD-10-CM | POA: Insufficient documentation

## 2018-04-09 LAB — TSH: TSH: 1.12 u[IU]/mL (ref 0.450–4.500)

## 2018-04-09 NOTE — Progress Notes (Signed)
[facility] Progress Note  Subjective:  Heidi Wolf is a 77 y.o. female with CAD s/p PCI, HLD, HTN, and diet-controlled T2DM who presents for follow-up ED visit on 03/26/18 for chest pain/left arm pain. She had unchanged EKG and negative troponins x 2 during that visit. She was thought to have MSK pain and was given a prescription for steroid taper. Patient reports this helped the pain but that pain has not completely resolved. She has been using aspercreme anad tylenol, which relieve symptoms. She has questions about how much tylenol she can take. She has felt a little "trembly" in the morning over the last week but blood sugars have been in low 100s. She just completed taper of prednisone for muscle inflammation. She says she last saw her Cardiologist Dr. Percival Spanish in February and that he'd said she was doing so well she could return in 1 year (though note says 6 months). Patient denies sensation of palpitations or chest pressure. No dyspnea with exertion. No fever or rash.  As part of work-up, patient had CT of her cervical spine, which showed degenerative changes similar to those seen on MRI in 2015. An incidental 17 mm hyperdense nodule was noted within the right thyroid lobe with radiology recommendation to obtain US.   Allergies  Allergen Reactions  . Aspirin Other (See Comments)    Makes stomach burn    Social History   Tobacco Use  . Smoking status: Never Smoker  . Smokeless tobacco: Never Used  Substance Use Topics  . Alcohol use: No    Objective: Blood pressure (!) 146/78, pulse (!) 58, temperature 98.3 F (36.8 C), temperature source Oral, weight 160 lb 9.6 oz (72.8 kg), SpO2 98 %. Body mass index is 27.57 kg/m. Constitutional: Well-appearing, older female in NAD Thyroid: No goiter or nodules palpated Cardiovascular: RRR, S1, S2, no m/r/g.  Pulmonary/Chest: Effort normal and breath sounds normal.  Musculoskeletal: No LE edema Skin: Skin is warm and dry. No  rash noted.  Psychiatric: Normal mood and affect.  Vitals reviewed  Assessment/Plan: Essential hypertension - Above goal of 140/90 (well functioning older adult with history of CAD) during more than 1 recent office visit. - Increase amlodipine from 5 mg to 10 mg. Continue benazepril 20 mg daily. Continue metoprolol 12.5 mg BID (cannot increase this due to history of worsened bradycardia on higher doses) - Will check TSH (as had incidentally noted thyroid nodule on recent CT as well) - Recommended increasing walking frequency - Return in 2 weeks for BP recheck - Recommended checking if Dr. Percival Spanish would like to see patient this summer rather than 1 year from last visit given recent ED visit  Back pain - Improving. Known cervical DDD. Continue topical creams prn and advised it is fine to take tylenol 650 mg TID prn.   Thyroid nodule - No abnormality on exam. Will check TSH. Reported jitteriness but has had recent steroid course. - Ordered follow-up US as recommended by radiology.  Follow-up in 2 weeks for BP check.  Olene Floss, MD [place], PGY-3

## 2018-04-09 NOTE — Assessment & Plan Note (Signed)
-   Improving. Known cervical DDD. Continue topical creams prn and advised it is fine to take tylenol 650 mg TID prn.

## 2018-04-09 NOTE — Assessment & Plan Note (Signed)
-   Above goal of 140/90 (well functioning older adult with history of CAD) during more than 1 recent office visit. - Increase amlodipine from 5 mg to 10 mg. Continue benazepril 20 mg daily. Continue metoprolol 12.5 mg BID (cannot increase this due to history of worsened bradycardia on higher doses) - Will check TSH (as had incidentally noted thyroid nodule on recent CT as well) - Recommended increasing walking frequency - Return in 2 weeks for BP recheck - Recommended checking if Dr. Percival Spanish would like to see patient this summer rather than 1 year from last visit given recent ED visit

## 2018-04-09 NOTE — Assessment & Plan Note (Signed)
-   No abnormality on exam. Will check TSH. Reported jitteriness but has had recent steroid course. - Ordered follow-up US as recommended by radiology.

## 2018-04-12 ENCOUNTER — Encounter: Payer: Self-pay | Admitting: Internal Medicine

## 2018-04-13 ENCOUNTER — Ambulatory Visit (HOSPITAL_COMMUNITY)
Admission: RE | Admit: 2018-04-13 | Discharge: 2018-04-13 | Disposition: A | Discharge status: Home / Self Care | Payer: Medicare Other | Source: Ambulatory Visit | Attending: Family Medicine | Admitting: Family Medicine

## 2018-04-13 DIAGNOSIS — E041 Nontoxic single thyroid nodule: Secondary | ICD-10-CM

## 2018-04-13 DIAGNOSIS — E042 Nontoxic multinodular goiter: Secondary | ICD-10-CM | POA: Insufficient documentation

## 2018-04-15 ENCOUNTER — Telehealth: Payer: Self-pay | Admitting: Internal Medicine

## 2018-04-15 DIAGNOSIS — R9389 Abnormal findings on diagnostic imaging of other specified body structures: Secondary | ICD-10-CM

## 2018-04-15 DIAGNOSIS — E041 Nontoxic single thyroid nodule: Secondary | ICD-10-CM

## 2018-04-15 NOTE — Telephone Encounter (Signed)
Left message for patient that thyroid biopsy recommended based on thyroid ultrasound result. Placed orders and asked patient to call back if any questions.  Please help patient schedule this.   Olene Floss, MD [place], PGY-3

## 2018-04-19 ENCOUNTER — Telehealth: Payer: Self-pay | Admitting: Internal Medicine

## 2018-04-19 NOTE — Telephone Encounter (Signed)
Left message for Radonna Ricker clarifying if new order needs to be placed, as I cannot modify the original order. Otherwise, I will cosign whatever corrected order is needed. Asked for call back for clarification.  Olene Floss, MD [place], PGY-3

## 2018-04-19 NOTE — Telephone Encounter (Signed)
Alley called from radiology because she needs another cpt code included since there 2 procedures for the Thyroid nodule 1 and then nodule 2. Please call if you need more information./ [phone] ext [phone]. Ally.  jw

## 2018-04-20 ENCOUNTER — Other Ambulatory Visit: Payer: Self-pay | Admitting: Internal Medicine

## 2018-04-20 DIAGNOSIS — E041 Nontoxic single thyroid nodule: Secondary | ICD-10-CM

## 2018-04-20 DIAGNOSIS — R9389 Abnormal findings on diagnostic imaging of other specified body structures: Secondary | ICD-10-CM

## 2018-04-29 ENCOUNTER — Other Ambulatory Visit: Payer: Self-pay

## 2018-04-29 ENCOUNTER — Encounter: Payer: Self-pay | Admitting: Internal Medicine

## 2018-04-29 ENCOUNTER — Ambulatory Visit (INDEPENDENT_AMBULATORY_CARE_PROVIDER_SITE_OTHER): Payer: Medicare Other | Admitting: Internal Medicine

## 2018-04-29 VITALS — BP 140/72 | HR 65 | Temp 98.2°F | Ht 64.0 in | Wt 160.0 lb

## 2018-04-29 DIAGNOSIS — E1159 Type 2 diabetes mellitus with other circulatory complications: Secondary | ICD-10-CM

## 2018-04-29 DIAGNOSIS — I10 Essential (primary) hypertension: Secondary | ICD-10-CM | POA: Diagnosis present

## 2018-04-29 DIAGNOSIS — I2510 Atherosclerotic heart disease of native coronary artery without angina pectoris: Secondary | ICD-10-CM | POA: Diagnosis not present

## 2018-04-29 MED ORDER — GLUCOSE BLOOD VI STRP: ORAL_STRIP | 3 refills | Status: DC

## 2018-04-29 MED ORDER — AMLODIPINE BESYLATE 10 MG PO TABS: 10.0000 mg | ORAL_TABLET | Freq: Every day | ORAL | 2 refills | Status: DC

## 2018-04-29 MED ORDER — ACCU-CHEK SOFT TOUCH LANCETS MISC: 3 refills | Status: DC

## 2018-04-29 NOTE — Progress Notes (Signed)
[facility] Progress Note  Subjective:  Heidi Wolf is a 78 y.o. female with history of CAD s/p PCI, HLD, HTN, and diet-controlled T2DMwho presents for BP follow-up.  #HTN: - Has been taking amlodipine 5 mg BID since visit in April. Thought she was a little more tired when she took both tablets together as 10 mg at a time but also restarted claritin for allergies.  - Also on benazepril 20 mg daily - Denies dizziness or leg swelling  #T2DM: - Needs accuchek aviva plus strips and lancets  Allergies  Allergen Reactions  . Aspirin Other (See Comments)    Makes stomach burn    Social History   Tobacco Use  . Smoking status: Never Smoker  . Smokeless tobacco: Never Used  Substance Use Topics  . Alcohol use: No    Objective: Blood pressure 140/72, pulse 65, temperature 98.2 F (36.8 C), temperature source Oral, height 5\' 4"  (1.626 m), weight 160 lb (72.6 kg), SpO2 97 %. Body mass index is 27.46 kg/m. Constitutional: Well-appearing, pleasant female in NAD Cardiovascular: RRR, S1, S2, no m/r/g.  Pulmonary/Chest: Effort normal and breath sounds normal.  Musculoskeletal: No LE edema Psychiatric: Normal mood and affect.  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 Reiterated that patient does not need to regularly check CBGs unless not feeling well given that her diabetes is well controlled with diet alone.   Follow-up in the fall or sooner as needed for A1c check.  Olene Floss, MD [place], PGY-3

## 2018-04-29 NOTE — Patient Instructions (Addendum)
Ms. Laperle,  I will be following along for your thyroid biopsy results. You can continue your regular medications but don't take the aspirin that morning.  I will order the correcting diabetes monitoring supplies. You do not need to check daily.  If you have any trouble with the 10 mg amlodipine, please call to let me know.   Your blood sugar has been well enough controlled that you could wait to have your A1c check until the fall (October).  Avoid cough syrups that contain pseudoephedrine, as this can increase blood pressure. Coricidin HBP is designed for people with high blood pressure.  Best, Dr. Ola Spurr

## 2018-05-02 ENCOUNTER — Encounter: Payer: Self-pay | Admitting: Internal Medicine

## 2018-05-02 NOTE — Assessment & Plan Note (Signed)
-   Ordered requested meter supplies. Reiterated that patient does not need to regularly check CBGs unless not feeling well given that her diabetes is well controlled with diet alone.

## 2018-05-02 NOTE — Assessment & Plan Note (Signed)
-   BP improved at 140/72 (goal < 150/90) - Continue amlodipine 10 mg daily with benazepril 20 mg daily

## 2018-05-18 ENCOUNTER — Ambulatory Visit
Admission: RE | Admit: 2018-05-18 | Discharge: 2018-05-18 | Disposition: A | Discharge status: Home / Self Care | Payer: Medicare Other | Source: Ambulatory Visit | Attending: Family Medicine | Admitting: Family Medicine

## 2018-05-18 ENCOUNTER — Other Ambulatory Visit (HOSPITAL_COMMUNITY)
Admission: RE | Admit: 2018-05-18 | Discharge: 2018-05-18 | Disposition: A | Discharge status: Home / Self Care | Payer: Medicare Other | Source: Ambulatory Visit | Attending: Physician Assistant | Admitting: Physician Assistant

## 2018-05-18 DIAGNOSIS — E042 Nontoxic multinodular goiter: Secondary | ICD-10-CM | POA: Diagnosis not present

## 2018-05-18 DIAGNOSIS — E041 Nontoxic single thyroid nodule: Secondary | ICD-10-CM

## 2018-05-18 DIAGNOSIS — R9389 Abnormal findings on diagnostic imaging of other specified body structures: Secondary | ICD-10-CM

## 2018-05-18 NOTE — Procedures (Signed)
PROCEDURE SUMMARY:  Using direct ultrasound guidance, 3 passes were made using 25 g needles into the nodules within the isthmus and the right lobe of the thyroid.   Ultrasound was used to confirm needle placements on all occasions.   Specimens were sent to Pathology for analysis.  See procedure note under Imaging tab in Epic for full procedure details.  Vallerie Hentz S Dixon [place] PA-C 05/18/2018 4:08 PM

## 2018-05-26 ENCOUNTER — Other Ambulatory Visit: Payer: Self-pay

## 2018-05-26 ENCOUNTER — Encounter: Payer: Self-pay | Admitting: Internal Medicine

## 2018-05-26 ENCOUNTER — Ambulatory Visit (INDEPENDENT_AMBULATORY_CARE_PROVIDER_SITE_OTHER): Payer: Medicare Other | Admitting: Internal Medicine

## 2018-05-26 VITALS — BP 148/82 | HR 52 | Temp 98.7°F | Ht 64.0 in | Wt 162.6 lb

## 2018-05-26 DIAGNOSIS — S43422D Sprain of left rotator cuff capsule, subsequent encounter: Secondary | ICD-10-CM

## 2018-05-26 DIAGNOSIS — E041 Nontoxic single thyroid nodule: Secondary | ICD-10-CM

## 2018-05-26 DIAGNOSIS — I2510 Atherosclerotic heart disease of native coronary artery without angina pectoris: Secondary | ICD-10-CM

## 2018-05-26 MED ORDER — ACCU-CHEK SOFTCLIX LANCETS MISC: 3 refills | Status: DC

## 2018-05-26 NOTE — Patient Instructions (Signed)
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  I think you have inflammation of the tendons in your rotator cuff of your shoulder. Sometimes a steroid injection is needed to calm this down. Please see me back in about 4 weeks if no improvement. I recommend physical therapy, doing range of motion exercises like arm circles and walking your hand up the wall, icing, tylenol 650 mg twice daily and aspercreme.  You should get a call about physical therapy within a week.  Best, Dr. Ola Spurr

## 2018-05-26 NOTE — Progress Notes (Signed)
[facility] Progress Note  Subjective:  Heidi Wolf is a 77 y.o. female with history of CAD s/p PCI, HLD, HTN, anddiet-controlleddiabetes who presents for ongoing left shoulder and neck pain. She has history of DDD of cervical spine with "[n]o more than mild central canal stenosis at any level" per CT c-spine read from 03/26/18. She began having pain about 2 months ago with some improvement on steroid taper prescribed in ED for suspected cervical radiculopathy. She felt somewhat jittery on prednisone, however. Pain is described as a soreness, and patient denies numbness or tingling of her arm. Soreness has been worst for the past week and over the weekend. She has needed to take 650 mg tylenol TID instead BID due to symptoms with some improvement. She also uses aspercreme. Pain bothers her a lot at night. She tries to avoid much lifting now. No known injury or increased activity. ROS: No rash, no fever, no chest pain  In addition, reviewed negative thyroid biopsies for incidentally noted nodules.  Allergies  Allergen Reactions  . Aspirin Other (See Comments)    Makes stomach burn    Social History   Tobacco Use  . Smoking status: Never Smoker  . Smokeless tobacco: Never Used  Substance Use Topics  . Alcohol use: No    Objective: Blood pressure (!) 148/82, pulse (!) 52, temperature 98.7 F (37.1 C), temperature source Oral, height 5\' 4"  (1.626 m), weight 162 lb 9.6 oz (73.8 kg), SpO2 97 %. Body mass index is 27.91 kg/m. Constitutional: Pleasant, older female in NAD Cardiovascular: RRR, S1, S2, no m/r/g.  Pulmonary/Chest: Effort normal and breath sounds normal.  Musculoskeletal: TTP over anterior shoulder on left and increased muscle tension over upper trapezius on left. Positive painful arc test on left but full ROM. Can almost touch scapula on left compared to able to reach more easily on right. Full resisted external rotation at shoulder bilaterally. Negative Empty  Can test. Positive lift off test on left for weakness compared to right.  Neurological: AOx3, no focal deficits. Strength 5/5 of upper extremities aside from lift-off test.  Skin: Skin is warm and dry. No rash noted.  Psychiatric: Somewhat anxious.  Vitals reviewed  Assessment/Plan: Sprain of left rotator cuff capsule - Based on exam and patient's description, suspect more likely has rotator cuff injury than radicular pain at present.  - Recommended continuing tylenol up to TID prn, aspercreme, icing, and demonstrated shoulder circles and walking hand up wall as way to maintain mobility at shoulder.  - Will refer to physical therapy for further instruction in shoulder rehab. - Would consider steroid injection if no improvement. Patient's diabetes well controlled and would think she could tolerate local injection better than she did PO steroid burst. - Could consider baclofen if continues to have sensation of neck tightness.   Thyroid nodule - Benign follicular nodules per pathology report. Provided print-outs to patient.   Follow-up in about 1 month if no improvement.  Olene Floss, MD [place], PGY-3

## 2018-05-27 ENCOUNTER — Encounter: Payer: Self-pay | Admitting: Internal Medicine

## 2018-05-27 DIAGNOSIS — S43422A Sprain of left rotator cuff capsule, initial encounter: Secondary | ICD-10-CM | POA: Insufficient documentation

## 2018-05-27 NOTE — Assessment & Plan Note (Signed)
-   Benign follicular nodules per pathology report. Provided print-outs to patient.

## 2018-05-27 NOTE — Assessment & Plan Note (Signed)
-   Based on exam and patient's description, suspect more likely has rotator cuff injury than radicular pain at present.  - Recommended continuing tylenol up to TID prn, aspercreme, icing, and demonstrated shoulder circles and walking hand up wall as way to maintain mobility at shoulder.  - Will refer to physical therapy for further instruction in shoulder rehab. - Would consider steroid injection if no improvement. Patient's diabetes well controlled and would think she could tolerate local injection better than she did PO steroid burst. - Could consider baclofen if continues to have sensation of neck tightness.

## 2018-06-04 ENCOUNTER — Ambulatory Visit: Payer: Medicare Other | Admitting: Physical Therapy

## 2018-06-04 DIAGNOSIS — H401131 Primary open-angle glaucoma, bilateral, mild stage: Secondary | ICD-10-CM | POA: Diagnosis not present

## 2018-07-26 ENCOUNTER — Other Ambulatory Visit: Payer: Self-pay | Admitting: Cardiology

## 2018-08-20 ENCOUNTER — Other Ambulatory Visit: Payer: Self-pay

## 2018-08-20 ENCOUNTER — Ambulatory Visit (INDEPENDENT_AMBULATORY_CARE_PROVIDER_SITE_OTHER): Payer: Medicare Other | Admitting: Family Medicine

## 2018-08-20 ENCOUNTER — Ambulatory Visit (HOSPITAL_COMMUNITY)
Admission: RE | Admit: 2018-08-20 | Discharge: 2018-08-20 | Disposition: A | Discharge status: Home / Self Care | Payer: Medicare Other | Source: Ambulatory Visit | Attending: Family Medicine | Admitting: Family Medicine

## 2018-08-20 ENCOUNTER — Encounter: Payer: Self-pay | Admitting: Family Medicine

## 2018-08-20 VITALS — BP 142/70 | HR 73 | Temp 98.7°F | Ht 64.0 in | Wt 163.0 lb

## 2018-08-20 DIAGNOSIS — I2510 Atherosclerotic heart disease of native coronary artery without angina pectoris: Secondary | ICD-10-CM | POA: Diagnosis not present

## 2018-08-20 DIAGNOSIS — M79602 Pain in left arm: Secondary | ICD-10-CM | POA: Insufficient documentation

## 2018-08-20 DIAGNOSIS — M5090 Cervical disc disorder, unspecified, unspecified cervical region: Secondary | ICD-10-CM | POA: Diagnosis not present

## 2018-08-20 DIAGNOSIS — M1990 Unspecified osteoarthritis, unspecified site: Secondary | ICD-10-CM

## 2018-08-20 DIAGNOSIS — Z23 Encounter for immunization: Secondary | ICD-10-CM | POA: Diagnosis not present

## 2018-08-20 MED ORDER — PREDNISONE 50 MG PO TABS: 50.0000 mg | ORAL_TABLET | Freq: Every day | ORAL | 0 refills | Status: DC

## 2018-08-20 NOTE — Progress Notes (Signed)
   CC: Left arm and neck pain  HPI  L arm pain - hx of pinched nerve she thinks. Previously told this in ED from imaging.  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  This episode began yesterday morning. Started in L side of neck and pain with L shoulder AROM. Tried APAP and aspercreme. Now pain down to L elbow. tried nitro just in case. Had "fast heartbeat maybe because I got a little nervous." still throbbing. No known injury. After seeing Dr. Ola Spurr on 5/30, she said the pain improved (thinks with prednisone) and was supposed to come back if no improvement.  She did not pursue physical therapy at this time because she felt she did not need it secondary to improvement.  No chest pain, no shortness of breath, no weakness or tingling.  No fever or systemic illness.  She does note she has a history of a drug-eluting stent.  ROS: Denies CP, SOB, abdominal pain, dysuria, changes in BMs.   CC, SH/smoking status, and VS noted  Objective: BP (!) 142/70   Pulse 73   Temp 98.7 F (37.1 C) (Oral)   Ht 5\' 4"  (1.626 m)   Wt 163 lb (73.9 kg)   SpO2 98%   BMI 27.98 kg/m  Gen: NAD, alert, cooperative, and pleasant elderly female. HEENT: NCAT, EOMI, PERRL CV: RRR, no murmur Resp: CTAB, no wheezes, non-labored Abd: SNTND, BS present, no guarding or organomegaly Ext: No edema, warm.  Pain with empty can test.  Spurling's positive.  No bony tenderness over C-spine or shoulder. Neuro: Alert and oriented, Speech clear, No gross deficits  Assessment and plan:  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 Patient with degenerative changes on C-spine imaging in the past.  Spurling's test positive, likely some component of radiculopathy to the current presentation.  Will treat with prednisone 50 mg x 5 days as this has provided improvement in the past.  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  Instructed patient that physical therapy may strengthen her muscles and help prevent future flares.  We will place a new physical therapy referral for her.  Can use Tylenol as needed.  EKG obtained by CMA due to pain radiating down the left arm and history of CAD prior to my exam.  EKG unchanged from previous.  History not suspicious for cardiac etiology.  Most likely cause is musculoskeletal as above, if new dyspnea or chest pain, patient is instructed to return to the emergency room.  Orders Placed This Encounter  Procedures  . Flu Vaccine QUAD 36+ mos IM  . Ambulatory referral to Physical Therapy    Referral Priority:   Routine    Referral Type:   Physical Medicine    Referral Reason:   Specialty Services Required    Requested Specialty:   Physical Therapy    Number of Visits Requested:   1  . EKG 12-Lead    Meds ordered this encounter  Medications  . predniSONE (DELTASONE) 50 MG tablet    Sig: Take 1 tablet (50 mg total) by mouth daily with breakfast.    Dispense:  5 tablet    Refill:  0    Ralene Ok, MD, PGY3 08/20/2018 2:25 PM

## 2018-08-20 NOTE — Assessment & Plan Note (Signed)
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  Instructed patient that physical therapy may strengthen her muscles and help prevent future flares.  We will place a new physical therapy referral for her.  Can use Tylenol as needed.

## 2018-08-20 NOTE — Patient Instructions (Signed)
It was a pleasure to see you today! Thank you for choosing [facility] for your primary care. Heidi Wolf was seen for neck and shoulder pain.   Our plans for today were:  Your pain is coming from your arthritis. Please continue to use tylenol as needed, and use the prednisone (AFTER EATING) for 5 days.   We will work on your physical therapy referral.   Best,  Dr. Lindell Noe

## 2018-08-20 NOTE — Assessment & Plan Note (Signed)
Patient with degenerative changes on C-spine imaging in the past.  Spurling's test positive, likely some component of radiculopathy to the current presentation.  Will treat with prednisone 50 mg x 5 days as this has provided improvement in the past.

## 2018-09-06 ENCOUNTER — Ambulatory Visit: Payer: Medicare Other | Admitting: Family Medicine

## 2018-09-07 DIAGNOSIS — H401131 Primary open-angle glaucoma, bilateral, mild stage: Secondary | ICD-10-CM | POA: Diagnosis not present

## 2018-09-09 ENCOUNTER — Ambulatory Visit: Payer: Medicare Other | Admitting: Family Medicine

## 2018-09-09 ENCOUNTER — Ambulatory Visit (INDEPENDENT_AMBULATORY_CARE_PROVIDER_SITE_OTHER): Payer: Medicare Other | Admitting: Family Medicine

## 2018-09-09 ENCOUNTER — Other Ambulatory Visit: Payer: Self-pay

## 2018-09-09 VITALS — BP 136/74 | HR 52 | Temp 98.3°F | Ht 64.0 in | Wt 162.0 lb

## 2018-09-09 DIAGNOSIS — E1159 Type 2 diabetes mellitus with other circulatory complications: Secondary | ICD-10-CM | POA: Diagnosis not present

## 2018-09-09 DIAGNOSIS — I2510 Atherosclerotic heart disease of native coronary artery without angina pectoris: Secondary | ICD-10-CM

## 2018-09-09 DIAGNOSIS — S43422D Sprain of left rotator cuff capsule, subsequent encounter: Secondary | ICD-10-CM

## 2018-09-09 DIAGNOSIS — M5090 Cervical disc disorder, unspecified, unspecified cervical region: Secondary | ICD-10-CM | POA: Diagnosis not present

## 2018-09-09 LAB — POCT GLYCOSYLATED HEMOGLOBIN (HGB A1C): HbA1c, POC (controlled diabetic range): 5.9 % (ref 0.0–7.0)

## 2018-09-09 MED ORDER — CAPSAICIN-MENTHOL-METHYL SAL 0.025-1-12 % EX CREA: 1.0000 "application " | TOPICAL_CREAM | Freq: Four times a day (QID) | CUTANEOUS | 2 refills | Status: DC

## 2018-09-09 NOTE — Patient Instructions (Signed)
Thank you for coming in to see [place] today. Please see below to review our plan for today's visit.  1.  I would like to discontinue your glipizide for your diabetes.  I believe the risks outweigh the benefits for this medication at this time.  Your A1c is very well controlled.  You can discontinue checking her sugar levels while off this medication and follow-up in 3 months for a recheck. 2.  Please call [phone] to schedule an appointment with physical therapy regarding her shoulder.  Unfortunately this is the only option we have at this time.  I have prescribed a topical medication called capsaicin cream which you can apply up to 4 times daily as needed.  Do not touch your eyes while using this medication and wash her hands thoroughly after each application.  Avoid NSAIDs such as ibuprofen or naproxen.  Please call the clinic at [phone] if your symptoms worsen or you have any concerns. It was our pleasure to serve you.  Harriet Butte, [place], PGY-3

## 2018-09-09 NOTE — Assessment & Plan Note (Signed)
Chronic.  Does have signs of rotator cuff involvement.  NSAIDs should be avoided in this patient given cardiac history. - Instructed to discontinue Tylenol if there is no effect and given prescription for capsaicin with instructions to use 4 times daily as needed

## 2018-09-09 NOTE — Progress Notes (Signed)
Subjective   Patient ID: Heidi Wolf    DOB: 07-24-1941, 77 y.o. female   MRN: [id number]  CC: "Left shoulder still hurts"  HPI: Heidi Wolf is a 77 y.o. female who presents to clinic today for the following:  Left shoulder pain: Patient here today for distant left shoulder pain with radicular symptoms beginning on the posterior left cervical neck radiating down to hand.  Patient was seen on 08/20/2018 for the same complaint and diagnosed as cervical radiculopathy likely related to arthritis.  She also had signs of rotator cuff injury on the left side.  Patient reports need for helping her daughter out who has a history of weakness secondary to a stroke.  Her daughter is the one who drove her here to this appointment and she is not present in the room.  Patient was referred to physical therapy but has not followed despite voicemail left by their office.  She is taking an over-the-counter aspirin cream with minimal improvement.  Tylenol does not help.  She has a history of stent placement for coronary artery disease and has recently seen her cardiologist back in May.  She did have a on changed EKG during her last evaluation in the clinic 3 weeks ago.  Patient denies chest pain, shortness of breath, diaphoresis, weakness.  Diabetes: Patient has long-standing diabetes without need for insulin.  She is currently on glyburide extended release only.  ROS: see HPI for pertinent.  [place]: Reviewed. Smoking status reviewed. Medications reviewed.  Objective   BP 136/74   Pulse (!) 52   Temp 98.3 F (36.8 C) (Oral)   Ht 5\' 4"  (1.626 m)   Wt 162 lb (73.5 kg)   SpO2 99%   BMI 27.81 kg/m  Vitals and nursing note reviewed.  General: frail elderly female, well nourished, well developed, NAD with non-toxic appearance HEENT: normocephalic, atraumatic, moist mucous membranes Neck: supple, non-tender without lymphadenopathy, positive Spurling test on left side, passive range of motion intact at cervical  spine Cardiovascular: regular rate and rhythm, systolic murmur without rubs, or gallops Lungs: clear to auscultation bilaterally with normal work of breathing Skin: warm, dry, no rashes or lesions, cap refill < 2 seconds Extremities: warm and well perfused, normal tone, no edema, 5/5 motor strength in upper extremities bilaterally, 2+ radial pulse bilaterally  Assessment & Plan   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 Chronic.  Likely secondary to osteoarthritis.  Poor candidate for surgical intervention. 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  Well-controlled, A1c is 5.7. - Instructed to discontinue glyburide extended release and avoid checking glucose levels until follow-up in 3 months which point cessation of glucose checks would be indicated  Sprain of left rotator cuff capsule Chronic.  Does have signs of rotator cuff involvement.  NSAIDs should be avoided in this patient given cardiac history. - Instructed to discontinue Tylenol if there is no effect and given prescription for capsaicin with instructions to use 4 times daily as needed  Orders Placed This Encounter  Procedures  . POCT glycosylated hemoglobin (Hb A1C)   Meds ordered this encounter  Medications  . Capsaicin-Menthol-Methyl Sal (CAPSAICIN-METHYL SAL-MENTHOL) 0.025-1-12 % CREA    Sig: Apply 1 application topically 4 (four) times daily.    Dispense:  1 Tube    Refill:  2    Shanon Brow  Yisroel Ramming, [place], PGY-3 09/09/2018, 12:22 PM

## 2018-09-09 NOTE — Assessment & Plan Note (Signed)
Chronic.  Well-controlled, A1c is 5.7. - Instructed to discontinue glyburide extended release and avoid checking glucose levels until follow-up in 3 months which point cessation of glucose checks would be indicated

## 2018-09-09 NOTE — Assessment & Plan Note (Addendum)
Chronic.  Likely secondary to osteoarthritis.  Poor candidate for surgical intervention. - Given contact information for physical therapy instructions to call back and schedule appointment - See plan for rotator cuff - Reviewed return precautions

## 2018-09-13 ENCOUNTER — Encounter: Payer: Self-pay | Admitting: Cardiology

## 2018-09-13 NOTE — Progress Notes (Signed)
Cardiology Office Note   Date:  09/14/2018   ID:  Theophilus Bones, DOB [birth year], MRN [id number]  PCP:  [name], DO  Cardiologist:   Minus Breeding, MD   Chief Complaint  Patient presents with  . Palpitations      History of Present Illness: Heidi Wolf is a 77 y.o. female who presents for follow up of CAD s/p Dx PCI/ DES 07/03/16. Re look 07/15/16 showed patent stent. She had no other significant CAD and LV wall motion was normal at cath. She complained of intermittent palpitations. A Holter monitor showed PACs and her beta blocker was increased.   She was in the ED in 9/18.  We saw her in consult and she was sent home after treatment of non anginal chest pain.   Since I last saw her she was in the ED with chest pain.  This was felt to be atypical and she had no evidence of ischemia.  I reviewed these records for this visit.    She unfortunately continues to complain of neck and arm pain.  This is sharp and pinching and almost constant.  It is clearly positional.  She has numbness that radiates down to her left arm a little bit down her right arm.  I did look at a CT scan that she had done earlier this year and there were some mild degenerative changes in her cervical spine.  She was instructed to use capsaicin lotion but she does not want to use this as it burns.  She is been taking Tylenol.  She is to start physical therapy but she was worried about this and wanted to check here first.  She does have palpitations mostly at night.  These are similar to previous but probably less severe.  She notices them when she is having pain at night trying to lie down.  She is not having any presyncope or syncope.  She is not having any substernal chest pressure or other anginal equivalent    Past Medical History:  Diagnosis Date  . Anemia   . Anxiety   . Arthritis    "right hip" (09/21/2014)  . CAD (coronary artery disease)    PCI 90% diagonal stenosis witha DES July 2017.  Nonobstructive  disease elsewhere.   . Cataracts, both eyes   . Colon polyps    Hyperplastic 2003  . External hemorrhoid   . GERD (gastroesophageal reflux disease)   . Glaucoma   . Hyperlipidemia   . Hypertension   . Internal hemorrhoid   . Personal history of colonic polyps 03/03/2012  . Type II diabetes mellitus ([place])    type 2    Past Surgical History:  Procedure Laterality Date  . ABDOMINAL HYSTERECTOMY    . APPENDECTOMY     "w/gallbladder"  . CARDIAC CATHETERIZATION  X 2  . CARDIAC CATHETERIZATION N/A 07/03/2016   Procedure: Left Heart Cath and Coronary Angiography;  Surgeon: Dixie Dials, MD;  Location: [place] [facility];  Service: Cardiovascular;  Laterality: N/A;  . CARDIAC CATHETERIZATION N/A 07/03/2016   Procedure: Coronary Stent Intervention;  Surgeon: Jettie Booze, MD;  Location: [place] [facility];  Service: Cardiovascular;  Laterality: N/A;  . CARDIAC CATHETERIZATION N/A 07/15/2016   Procedure: Left Heart Cath and Coronary Angiography;  Surgeon: Dixie Dials, MD;  Location: [place] [facility];  Service: Cardiovascular;  Laterality: N/A;  . CHOLECYSTECTOMY    . COLONOSCOPY    . CORONARY ANGIOPLASTY WITH STENT PLACEMENT  06/2016  . TUBAL LIGATION  1971     Current Outpatient Medications  Medication Sig Dispense Refill  . ACCU-CHEK SOFTCLIX LANCETS lancets Daily as needed. 100 each 3  . acetaminophen (TYLENOL) 325 MG tablet Take 2 tablets (650 mg total) by mouth 3 (three) times daily as needed for mild pain or moderate pain.    Marland Kitchen amLODipine (NORVASC) 10 MG tablet Take 1 tablet (10 mg total) by mouth daily. 90 tablet 2  . aspirin EC 81 MG tablet Take 1 tablet (81 mg total) by mouth daily.    Marland Kitchen atorvastatin (LIPITOR) 80 MG tablet Take 1 tablet (80 mg total) by mouth every evening. 90 tablet 3  . benazepril (LOTENSIN) 20 MG tablet Take 1 tablet (20 mg total) by mouth daily. 90 tablet 3  . cholecalciferol (VITAMIN D) 1000 UNITS tablet Take 1,000 Units by mouth every other day.       Marland Kitchen glipiZIDE (GLUCOTROL XL) 2.5 MG 24 hr tablet Take by mouth.    Marland Kitchen glucose blood (ACCU-CHEK AVIVA PLUS) test strip Check blood sugar up to once daily as needed. 100 each 3  . Lancets (ACCU-CHEK SOFT TOUCH) lancets Check up to once daily as needed. 100 each 3  . loratadine (CLARITIN) 10 MG tablet Take 10 mg by mouth daily as needed for allergies.     Marland Kitchen LUMIGAN 0.01 % SOLN Place 1 drop into both eyes at bedtime.     . metoprolol tartrate (LOPRESSOR) 25 MG tablet Take 1/2 tablets(12.5mg ) in the morning and 1 tablet(25 mg) at bedtime 135 tablet 3  . nitroGLYCERIN (NITROSTAT) 0.4 MG SL tablet PLACE 1 TABLET UNDER TONGUE EVERY 5 MINUTES AS NEEDED CHEST PAIN, SEEK MEDICAL ATTENTION IF NO RELIEF AFTER 2ND DOSE 25 tablet 1  . pantoprazole (PROTONIX) 40 MG tablet Take 1 tablet (40 mg total) by mouth daily. 90 tablet 3  . polyethylene glycol powder (GLYCOLAX/MIRALAX) powder Use 1 tsp daily as needed for constipation. Mix into liquid. 500 g 0  . potassium chloride SA (K-DUR,KLOR-CON) 20 MEQ tablet Take 1 tablet (20 mEq total) by mouth daily. 90 tablet 3  . trolamine salicylate (ASPERCREME) 10 % cream Apply 1 application topically as needed for muscle pain.     No current facility-administered medications for this visit.     Allergies:   Aspirin   ROS:  Please see the history of present illness.   Otherwise, review of systems are positive none.   All other systems are reviewed and negative.    PHYSICAL EXAM: VS:  BP (!) 152/82   Pulse 69   Ht 5\' 4"  (1.626 m)   Wt 161 lb 6.4 oz (73.2 kg)   BMI 27.70 kg/m  , BMI Body mass index is 27.7 kg/m.  GENERAL:  Well appearing NECK:  No jugular venous distention, waveform within normal limits, carotid upstroke brisk and symmetric, no bruits, no thyromegaly LUNGS:  Clear to auscultation bilaterally CHEST:  Unremarkable HEART:  PMI not displaced or sustained,S1 and S2 within normal limits, no S3, no S4, no clicks, no rubs,  Soft apical early peaking systolic  murmur, no diastolic murmurs ABD:  Flat, positive bowel sounds normal in frequency in pitch, no bruits, no rebound, no guarding, no midline pulsatile mass, no hepatomegaly, no splenomegaly EXT:  2 plus pulses throughout, no edema, no cyanosis no clubbing   EKG:  EKG is not ordered today. EKG August 20, 2018 sinus bradycardia, rate 55, left ventricular hypertrophy by voltage criteria with repolarization changes and  no change from previous.  Recent Labs: 09/30/2017: ALT 23 03/26/2018: BUN 16; Creatinine, Ser 1.10; Hemoglobin 12.9; Platelets 256; Potassium 3.6; Sodium 136 04/08/2018: TSH 1.120    Lipid Panel    Component Value Date/Time   CHOL 96 (L) 09/18/2017 1016   TRIG 76 09/18/2017 1016   HDL 41 09/18/2017 1016   CHOLHDL 2.3 09/18/2017 1016   CHOLHDL 2.6 07/15/2016 0111   VLDL 20 07/15/2016 0111   LDLCALC 40 09/18/2017 1016     Lab Results  Component Value Date   HGBA1C 5.9 09/09/2018    Wt Readings from Last 3 Encounters:  09/14/18 161 lb 6.4 oz (73.2 kg)  09/09/18 162 lb (73.5 kg)  08/20/18 163 lb (73.9 kg)      Other studies Reviewed: Additional studies/ records that were reviewed today include: ED records, EKG, neck CT Review of the above records demonstrates:     ASSESSMENT AND PLAN:  CAD:  The patient has no new sypmtoms.  No further testing.  No change in therapy or further testing.  PALPITATIONS:    Today I am going to increase her metoprolol to 25 mg tablet at night in addition to the half tablet in the morning.   HTN:   The blood pressure is at target.  She will continue with the change as listed.  DM:   A1C well controlled.  She will continue with meds as listed.  HYPERLIPIDEMIA:     LDL was well controlled last year.  She can have this followed by her primary provider.     Current medicines are reviewed at length with the patient today.  The patient does not have concerns regarding medicines.  The following changes have been made:   As  above.  Labs/ tests ordered today include:    No orders of the defined types were placed in this encounter.    Disposition:   FU with me in 12  months.     Signed, Minus Breeding, MD  09/14/2018 9:43 AM    [facility]

## 2018-09-14 ENCOUNTER — Encounter: Payer: Self-pay | Admitting: Cardiology

## 2018-09-14 ENCOUNTER — Ambulatory Visit (INDEPENDENT_AMBULATORY_CARE_PROVIDER_SITE_OTHER): Payer: Medicare Other | Admitting: Cardiology

## 2018-09-14 VITALS — BP 152/82 | HR 69 | Ht 64.0 in | Wt 161.4 lb

## 2018-09-14 DIAGNOSIS — I10 Essential (primary) hypertension: Secondary | ICD-10-CM | POA: Diagnosis not present

## 2018-09-14 DIAGNOSIS — R002 Palpitations: Secondary | ICD-10-CM | POA: Diagnosis not present

## 2018-09-14 DIAGNOSIS — I2510 Atherosclerotic heart disease of native coronary artery without angina pectoris: Secondary | ICD-10-CM

## 2018-09-14 DIAGNOSIS — E785 Hyperlipidemia, unspecified: Secondary | ICD-10-CM | POA: Diagnosis not present

## 2018-09-14 MED ORDER — METOPROLOL TARTRATE 25 MG PO TABS: ORAL_TABLET | ORAL | 3 refills | Status: DC

## 2018-09-14 MED ORDER — PANTOPRAZOLE SODIUM 40 MG PO TBEC: 40.0000 mg | DELAYED_RELEASE_TABLET | Freq: Every day | ORAL | 3 refills | Status: DC

## 2018-09-14 NOTE — Patient Instructions (Signed)
Medication Instructions:  INCREASE- Metoprolol 1/2 tablet(12.5 mg) in the morning and 1 tablet(25 mg) at bedtime  If you need a refill on your cardiac medications before your next appointment, please call your pharmacy.  Labwork: None Ordered   Testing/Procedures: None Ordered   Follow-Up: Your physician wants you to follow-up in: 1 Year. You should receive a reminder letter in the mail two months in advance. If you do not receive a letter, please call our office in [phone] to schedule your follow-up appointment.     Thank you for choosing [facility] at [facility]!!

## 2018-09-28 ENCOUNTER — Encounter: Payer: Self-pay | Admitting: Physical Therapy

## 2018-09-28 ENCOUNTER — Other Ambulatory Visit: Payer: Self-pay

## 2018-09-28 ENCOUNTER — Ambulatory Visit: Payer: Medicare Other | Attending: Family Medicine | Admitting: Physical Therapy

## 2018-09-28 DIAGNOSIS — M62830 Muscle spasm of back: Secondary | ICD-10-CM | POA: Diagnosis not present

## 2018-09-28 DIAGNOSIS — M6281 Muscle weakness (generalized): Secondary | ICD-10-CM | POA: Diagnosis not present

## 2018-09-28 DIAGNOSIS — M542 Cervicalgia: Secondary | ICD-10-CM | POA: Diagnosis not present

## 2018-09-28 NOTE — Therapy (Signed)
[place], [place], [address] Phone: [phone]   Fax:  [phone]  Physical Therapy Evaluation  Patient Details  Name: Heidi Wolf MRN: [id number] Date of Birth: 19-Apr-1941 Referring Provider (PT): Dr Kara Pacer   Encounter Date: 09/28/2018  PT End of Session - 09/28/18 1051    Visit Number  1    Number of Visits  12    Date for PT Re-Evaluation  11/09/18    PT Start Time  4431    PT Stop Time  1054    PT Time Calculation (min)  39 min    Activity Tolerance  Patient limited by pain       Past Medical History:  Diagnosis Date  . Anemia   . Anxiety   . Arthritis    "right hip" (09/21/2014)  . CAD (coronary artery disease)    PCI 90% diagonal stenosis witha DES July 2017.  Nonobstructive disease elsewhere.   . Cataracts, both eyes   . Colon polyps    Hyperplastic 2003  . External hemorrhoid   . GERD (gastroesophageal reflux disease)   . Glaucoma   . Hyperlipidemia   . Hypertension   . Internal hemorrhoid   . Personal history of colonic polyps 03/03/2012  . Type II diabetes mellitus ([place])    type 2    Past Surgical History:  Procedure Laterality Date  . ABDOMINAL HYSTERECTOMY    . APPENDECTOMY     "w/gallbladder"  . CARDIAC CATHETERIZATION  X 2  . CARDIAC CATHETERIZATION N/A 07/03/2016   Procedure: Left Heart Cath and Coronary Angiography;  Surgeon: Dixie Dials, MD;  Location: [facility];  Service: Cardiovascular;  Laterality: N/A;  . CARDIAC CATHETERIZATION N/A 07/03/2016   Procedure: Coronary Stent Intervention;  Surgeon: Jettie Booze, MD;  Location: [facility];  Service: Cardiovascular;  Laterality: N/A;  . CARDIAC CATHETERIZATION N/A 07/15/2016   Procedure: Left Heart Cath and Coronary Angiography;  Surgeon: Dixie Dials, MD;  Location: [facility];  Service: Cardiovascular;  Laterality: N/A;  . CHOLECYSTECTOMY    . COLONOSCOPY    . CORONARY  ANGIOPLASTY WITH STENT PLACEMENT  06/2016  . TUBAL LIGATION  1971    There were no vitals filed for this visit.   Subjective Assessment - 09/28/18 1015    Subjective  Pt reports the pain starts in her Lt neck and works its way down into her hand and sometimes she has trouble gripping with the left side.      Pertinent History  DM, heart conditions    Diagnostic tests  xrays DDD    Patient Stated Goals  get rid of pain and be able to use lt arm again, sleep on Lt side again,          [facility] PT Assessment - 09/28/18 0001      Assessment   Medical Diagnosis  Cervicalgia with DDD    Referring Provider (PT)  Dr Coletta Memos    Onset Date/Surgical Date  09/28/17   for neck pain, 3 wks for arm pain   Hand Dominance  Right    Next MD Visit  to schedule     Prior Therapy  none      Precautions   Precautions  None      Balance Screen   Has the patient fallen in the past 6 months  No      [place]  residence    Living Arrangements  Children      Prior Function   Level of Independence  Independent    Leisure  go out with her daughter and walk in stores      Observation/Other Assessments   Focus on Therapeutic Outcomes (FOTO)   60% limited      Posture/Postural Control   Posture/Postural Control  Postural limitations    Postural Limitations  Rounded Shoulders;Forward head;Increased thoracic kyphosis   head rotated to the Lt at rest     ROM / Strength   AROM / PROM / Strength  AROM;Strength      AROM   AROM Assessment Site  Cervical;Shoulder    Right/Left Shoulder  Left   Rt WNL   Left Shoulder Flexion  72 Degrees   sore in shoulder and pain down to hand   Left Shoulder ABduction  105 Degrees    Left Shoulder Internal Rotation  88 Degrees    Left Shoulder External Rotation  70 Degrees    Cervical Flexion  to chest slight pain Lt pecs    Cervical Extension  28 with rotation to the Lt, symptoms into the Lt arm    Cervical - Right  Side Bend  38    Cervical - Left Side Bend  45    Cervical - Right Rotation  65    Cervical - Left Rotation  78      Strength   Strength Assessment Site  Shoulder;Elbow;Hand    Right/Left Shoulder  --   Lt grossly 4+/5, Rt WNL   Right/Left Elbow  --   Rt WNL, Lt biceps 4+/5, tricpes 4-/5   Right/Left hand  Left;Right    Right Hand Grip (lbs)  36/40/25    Left Hand Grip (lbs)  27/27/32      Palpation   Spinal mobility  NA at this visit    Palpation comment  very tight in bilat upper traps/levators/pecs, SCM/scalenes, periocciput and cervical paraspinals.                 Objective measurements completed on examination: See above findings.      [facility] Adult PT Treatment/Exercise - 09/28/18 0001      Exercises   Exercises  Other Exercises    Other Exercises   performed HEP per handouts, she required multiple physical and verbal cues for form and correct technique             PT Education - 09/28/18 1253    Education Details  HEP and initial posture    Person(s) Educated  Patient    Methods  Explanation;Demonstration;Handout    Comprehension  Verbalized understanding;Returned demonstration          PT Long Term Goals - 09/28/18 1011      PT LONG TERM GOAL #1   Title  I with advanced HEP ( 11/09/18)     Time  6    Period  Weeks    Status  New    Target Date  11/09/18      PT LONG TERM GOAL #2   Title  improve FOTO =/< 46% limited ( 11/09/18)     Time  6    Period  Weeks    Status  New      PT LONG TERM GOAL #3   Title  report =/> 75% reduction of neck pain and LT UE pain with ADLs ( 11/09/18)     Time  6  Period  Weeks    Status  New      PT LONG TERM GOAL #4   Title  reduce pain to allow her to sleep on her left side per her previous status ( 11/09/18)     Time  6    Period  Weeks    Status  New    Target Date  11/09/18      PT LONG TERM GOAL #5   Title  improve Lt grip =/> 35# to allow her to not feel like she is going to drop  things. ( 11/09/18)     Time  6    Period  Weeks    Status  New    Target Date  11/09/18             Plan - 09/28/18 1009    Clinical Impression Statement  77 yo female presents with c/o neck pain with radicular symptoms into her Lt hand.  She has significant posture changes with forward, rotated head, rounded shoulders and increased thoracic kyphosis.  Her Lt shoulder ROM i slimited d/t pain and her grip is weak.  She has alot of muscular tightness in and around her neck and thoracic area. She would benefit form PT to restore her ROM, strength and reduce pain.     History and Personal Factors relevant to plan of care:  DM, anxiety, MI, CAD    Clinical Presentation  Evolving    Clinical Decision Making  Moderate    Rehab Potential  Good    PT Frequency  2x / week    PT Duration  6 weeks    PT Treatment/Interventions  Neuromuscular re-education;Dry needling;Manual techniques;Moist Heat;Traction;Ultrasound;Therapeutic activities;Patient/family education;Taping;Therapeutic exercise;Cryotherapy;Electrical Stimulation;Passive range of motion    PT Next Visit Plan  review and progress HEP, manual work to neck and upper shoulders, modalities PRN    Consulted and Agree with Plan of Care  Patient       Patient will benefit from skilled therapeutic intervention in order to improve the following deficits and impairments:  Pain, Postural dysfunction, Increased muscle spasms, Decreased range of motion, Decreased strength, Impaired UE functional use, Obesity  Visit Diagnosis: Cervicalgia - Plan: PT plan of care cert/re-cert  Muscle weakness (generalized) - Plan: PT plan of care cert/re-cert  Muscle spasm of back - Plan: PT plan of care cert/re-cert     Problem List Patient Active Problem List   Diagnosis Date Noted  . Sprain of left rotator cuff capsule 05/27/2018  . Thyroid nodule 04/09/2018  . Back pain 04/09/2018  . Type II diabetes mellitus ([place])   . Myocardial infarction ([place])    . Internal hemorrhoid   . Hypertension   . Hyperlipidemia   . GERD (gastroesophageal reflux disease)   . External hemorrhoid   . Colon polyps   . Cataracts, both eyes   . CAD (coronary artery disease)   . Arthritis   . Anxiety   . Anemia   . Allergy   . Fatigue 05/21/2017  . Essential hypertension 09/03/2016  . Dyslipidemia 09/03/2016  . 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 Palpitations 09/03/2016  . Chest pain 07/03/2016  . CAD- S/P PCI July 2017 07/02/2016  . 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 Chest pain at rest 01/17/2014  . Personal history of colonic polyps 03/03/2012  . Internal hemorrhoids without mention of complication 09/73/5329    Jeral Pinch PT  09/28/2018, 1:05 PM  [place], [place], [address] Phone: [phone]   Fax:  [phone]  Name: Heidi Wolf MRN: [id number] Date of Birth: 04-26-1941

## 2018-10-08 ENCOUNTER — Encounter

## 2018-10-12 ENCOUNTER — Ambulatory Visit: Payer: Medicare Other | Admitting: Physical Therapy

## 2018-10-14 ENCOUNTER — Encounter: Payer: Self-pay | Admitting: Physical Therapy

## 2018-10-14 ENCOUNTER — Ambulatory Visit: Payer: Medicare Other | Admitting: Physical Therapy

## 2018-10-14 DIAGNOSIS — M62830 Muscle spasm of back: Secondary | ICD-10-CM | POA: Diagnosis not present

## 2018-10-14 DIAGNOSIS — M6281 Muscle weakness (generalized): Secondary | ICD-10-CM

## 2018-10-14 DIAGNOSIS — M542 Cervicalgia: Secondary | ICD-10-CM | POA: Diagnosis not present

## 2018-10-14 NOTE — Patient Instructions (Addendum)
Over Head Pull: Narrow Grip   Perform these every other day with yellow band   On back, knees bent, feet flat, band across thighs, elbows straight but relaxed. Pull hands apart (start). Keeping elbows straight, bring arms up and over head, hands toward floor. Keep pull steady on band. Hold momentarily. Return slowly, keeping pull steady, back to start. Repeat _15__ times. Band color __yellow____   Side Pull: Double Arm   On back, knees bent, feet flat. Arms perpendicular to body, shoulder level, elbows straight but relaxed. Pull arms out to sides, elbows straight. Resistance band comes across collarbones, hands toward floor. Hold momentarily. Slowly return to starting position. Repeat _15__ times. Band color _yellow____   Shoulder Rotation: Double Arm   On back, knees bent, feet flat, elbows tucked at sides, bent 90, hands palms up. Pull hands apart and down toward floor, keeping elbows near sides. Hold momentarily. Slowly return to starting position. Repeat _15__ times. Band color ___yellow___      Housework - Vacuuming    Hold the vacuum with arm held at side. Step back and forth to move it, keeping head up. Avoid twisting.

## 2018-10-14 NOTE — Therapy (Signed)
[place], [place], [address] Phone: [phone]   Fax:  [phone]  Physical Therapy Treatment  Patient Details  Name: Heidi Wolf MRN: [id number] Date of Birth: 1941/01/19 Referring Provider (PT): Dr Heidi Wolf   Encounter Date: 10/14/2018  PT End of Session - 10/14/18 1147    Visit Number  2    Number of Visits  12    Date for PT Re-Evaluation  11/09/18    PT Start Time  1147    PT Stop Time  1236    PT Time Calculation (min)  49 min    Activity Tolerance  Patient tolerated treatment well       Past Medical History:  Diagnosis Date  . Anemia   . Anxiety   . Arthritis    "right hip" (09/21/2014)  . CAD (coronary artery disease)    PCI 90% diagonal stenosis witha DES July 2017.  Nonobstructive disease elsewhere.   . Cataracts, both eyes   . Colon polyps    Hyperplastic 2003  . External hemorrhoid   . GERD (gastroesophageal reflux disease)   . Glaucoma   . Hyperlipidemia   . Hypertension   . Internal hemorrhoid   . Personal history of colonic polyps 03/03/2012  . Type II diabetes mellitus ([place])    type 2    Past Surgical History:  Procedure Laterality Date  . ABDOMINAL HYSTERECTOMY    . APPENDECTOMY     "w/gallbladder"  . CARDIAC CATHETERIZATION  X 2  . CARDIAC CATHETERIZATION N/A 07/03/2016   Procedure: Left Heart Cath and Coronary Angiography;  Surgeon: Heidi Dials, MD;  Location: [facility];  Service: Cardiovascular;  Laterality: N/A;  . CARDIAC CATHETERIZATION N/A 07/03/2016   Procedure: Coronary Stent Intervention;  Surgeon: Heidi Booze, MD;  Location: [facility];  Service: Cardiovascular;  Laterality: N/A;  . CARDIAC CATHETERIZATION N/A 07/15/2016   Procedure: Left Heart Cath and Coronary Angiography;  Surgeon: Heidi Dials, MD;  Location: [facility];  Service: Cardiovascular;  Laterality: N/A;  . CHOLECYSTECTOMY    . COLONOSCOPY    . CORONARY  ANGIOPLASTY WITH STENT PLACEMENT  06/2016  . TUBAL LIGATION  1971    There were no vitals filed for this visit.  Subjective Assessment - 10/14/18 1147    Subjective  Pt reports she is doing ok with her HEP, still has that weird feeling that runs down the Lt UE, it is not as strong as it used to be.  Still hasn't done any vacumming, did mop this week.     Patient Stated Goals  get rid of pain and be able to use lt arm again, sleep on Lt side again,     Currently in Pain?  Yes    Pain Score  5     Pain Location  Neck    Pain Orientation  Left    Pain Descriptors / Indicators  Aching    Pain Type  Chronic pain    Pain Frequency  Intermittent    Aggravating Factors   over using her arm    Pain Relieving Factors  tylenol                       [facility] Adult PT Treatment/Exercise - 10/14/18 0001      Exercises   Exercises  Neck      Neck Exercises: Supine   Other Supine Exercise  15 reps,  yellow band overhead pull, horizontal abduction, ER    Other Supine Exercise  15 reps head and shoulder presses, shoulder rolls BWD      Modalities   Modalities  Moist Heat      Moist Heat Therapy   Number Minutes Moist Heat  10 Minutes    Moist Heat Location  Cervical      Manual Therapy   Manual Therapy  Joint mobilization;Soft tissue mobilization;Manual Traction    Joint Mobilization  grade II cervical mobs     Soft tissue mobilization  STM to bilat cervical paraspinals, Lt SCM/ scalenes and pecs - these caused increased symtptoms into the Lt UE     Manual Traction  gentle cervical traction - takes away the Lt UE sypmtoms                  PT Long Term Goals - 09/28/18 1011      PT LONG TERM GOAL #1   Title  I with advanced HEP ( 11/09/18)     Time  6    Period  Weeks    Status  New    Target Date  11/09/18      PT LONG TERM GOAL #2   Title  improve FOTO =/< 46% limited ( 11/09/18)     Time  6    Period  Weeks    Status  New      PT LONG TERM GOAL #3    Title  report =/> 75% reduction of neck pain and LT UE pain with ADLs ( 11/09/18)     Time  6    Period  Weeks    Status  New      PT LONG TERM GOAL #4   Title  reduce pain to allow her to sleep on her left side per her previous status ( 11/09/18)     Time  6    Period  Weeks    Status  New    Target Date  11/09/18      PT LONG TERM GOAL #5   Title  improve Lt grip =/> 35# to allow her to not feel like she is going to drop things. ( 11/09/18)     Time  6    Period  Weeks    Status  New    Target Date  11/09/18            Plan - 10/14/18 1220    Clinical Impression Statement  [name] reports some improvement inLt Ue symptoms however they are still present.  This is her second visit and she is doing well with initial HEP.  Tolerated band exercises well and was issued these for HEP.  She had relief with manaul cervical traction and increased symptoms with STW to the Lt shoulder and neck.  No goals met and she may benefit from some gentle cervical traction.     Rehab Potential  Good    PT Frequency  2x / week    PT Duration  6 weeks    PT Treatment/Interventions  Neuromuscular re-education;Dry needling;Manual techniques;Moist Heat;Traction;Ultrasound;Therapeutic activities;Patient/family education;Taping;Therapeutic exercise;Cryotherapy;Electrical Stimulation;Passive range of motion    PT Next Visit Plan  may benefit from light cervical traction if no improvement    Consulted and Agree with Plan of Care  Patient       Patient will benefit from skilled therapeutic intervention in order to improve the following deficits and impairments:  Pain, Postural dysfunction, Increased muscle spasms, Decreased  range of motion, Decreased strength, Impaired UE functional use, Obesity  Visit Diagnosis: Cervicalgia  Muscle weakness (generalized)  Muscle spasm of back     Problem List Patient Active Problem List   Diagnosis Date Noted  . Sprain of left rotator cuff capsule 05/27/2018  .  Thyroid nodule 04/09/2018  . Back pain 04/09/2018  . Type II diabetes mellitus ([place])   . Myocardial infarction ([place])   . Internal hemorrhoid   . Hypertension   . Hyperlipidemia   . GERD (gastroesophageal reflux disease)   . External hemorrhoid   . Colon polyps   . Cataracts, both eyes   . CAD (coronary artery disease)   . Arthritis   . Anxiety   . Anemia   . Allergy   . Fatigue 05/21/2017  . Essential hypertension 09/03/2016  . Dyslipidemia 09/03/2016  . 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 Palpitations 09/03/2016  . Chest pain 07/03/2016  . CAD- S/P PCI July 2017 07/02/2016  . 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 Chest pain at rest 01/17/2014  . Personal history of colonic polyps 03/03/2012  . Internal hemorrhoids without mention of complication 25/36/6440    Jeral Pinch PT  10/14/2018, 12:31 PM  [facility] [address] [place], [place], [address] Phone: [phone]   Fax:  [phone]  Name: Heidi Wolf MRN: [id number] Date of Birth: 03/07/1941

## 2018-10-19 ENCOUNTER — Encounter: Payer: Self-pay | Admitting: Physical Therapy

## 2018-10-19 ENCOUNTER — Other Ambulatory Visit: Payer: Self-pay | Admitting: Family Medicine

## 2018-10-19 ENCOUNTER — Ambulatory Visit: Payer: Medicare Other | Admitting: Physical Therapy

## 2018-10-19 DIAGNOSIS — M542 Cervicalgia: Secondary | ICD-10-CM | POA: Diagnosis not present

## 2018-10-19 DIAGNOSIS — M6281 Muscle weakness (generalized): Secondary | ICD-10-CM

## 2018-10-19 DIAGNOSIS — Z1231 Encounter for screening mammogram for malignant neoplasm of breast: Secondary | ICD-10-CM

## 2018-10-19 DIAGNOSIS — M62830 Muscle spasm of back: Secondary | ICD-10-CM | POA: Diagnosis not present

## 2018-10-19 NOTE — Therapy (Signed)
[place], [place], [address] Phone: [phone]   Fax:  [phone]  Physical Therapy Treatment  Patient Details  Name: Heidi Wolf MRN: [id number] Date of Birth: November 18, 1941 Referring Provider (PT): Dr Coletta Memos   Encounter Date: 10/19/2018  PT End of Session - 10/19/18 1206    Visit Number  3    Number of Visits  12    Date for PT Re-Evaluation  11/09/18    PT Start Time  1141    PT Stop Time  1220    PT Time Calculation (min)  39 min    Activity Tolerance  Patient tolerated treatment well       Past Medical History:  Diagnosis Date  . Anemia   . Anxiety   . Arthritis    "right hip" (09/21/2014)  . CAD (coronary artery disease)    PCI 90% diagonal stenosis witha DES July 2017.  Nonobstructive disease elsewhere.   . Cataracts, both eyes   . Colon polyps    Hyperplastic 2003  . External hemorrhoid   . GERD (gastroesophageal reflux disease)   . Glaucoma   . Hyperlipidemia   . Hypertension   . Internal hemorrhoid   . Personal history of colonic polyps 03/03/2012  . Type II diabetes mellitus ([place])    type 2    Past Surgical History:  Procedure Laterality Date  . ABDOMINAL HYSTERECTOMY    . APPENDECTOMY     "w/gallbladder"  . CARDIAC CATHETERIZATION  X 2  . CARDIAC CATHETERIZATION N/A 07/03/2016   Procedure: Left Heart Cath and Coronary Angiography;  Surgeon: Dixie Dials, MD;  Location: [facility];  Service: Cardiovascular;  Laterality: N/A;  . CARDIAC CATHETERIZATION N/A 07/03/2016   Procedure: Coronary Stent Intervention;  Surgeon: Jettie Booze, MD;  Location: [facility];  Service: Cardiovascular;  Laterality: N/A;  . CARDIAC CATHETERIZATION N/A 07/15/2016   Procedure: Left Heart Cath and Coronary Angiography;  Surgeon: Dixie Dials, MD;  Location: [facility];  Service: Cardiovascular;  Laterality: N/A;  . CHOLECYSTECTOMY    . COLONOSCOPY    . CORONARY  ANGIOPLASTY WITH STENT PLACEMENT  06/2016  . TUBAL LIGATION  1971    There were no vitals filed for this visit.  Subjective Assessment - 10/19/18 1138    Subjective  Pt reports she is not as painful as it was however still having queer feeling into Lt UE, did sleep well last night     Currently in Pain?  Yes    Pain Score  5     Pain Location  Neck    Pain Orientation  Left    Pain Descriptors / Indicators  Burning;Aching    Pain Type  Chronic pain    Pain Radiating Towards  into left hand    Pain Onset  More than a month ago    Pain Frequency  Intermittent    Aggravating Factors   over using her Lt arm    Pain Relieving Factors  tylenol         [facility] PT Assessment - 10/19/18 0001      Assessment   Medical Diagnosis  Cervicalgia with DDD    Referring Provider (PT)  Dr Coletta Memos      Strength   Left Hand Grip (lbs)  42/35/40                   [facility] Adult PT Treatment/Exercise - 10/19/18  0001      Exercises   Exercises  Neck      Neck Exercises: Standing   Other Standing Exercises  scapular squeezes around half bolster 10x10sec, VC for form she has increased symptoms in Lt UE when she extends her LT shoulder, less with isolated scapu retraction      Neck Exercises: Supine   Neck Retraction  15 reps   head presses     Neck Exercises: Prone   Other Prone Exercise  15 reps , red band horizontal abduction wiith trunk rotation      Modalities   Modalities  Moist Heat;Traction      Moist Heat Therapy   Number Minutes Moist Heat  15 Minutes    Moist Heat Location  Cervical      Traction   Type of Traction  Cervical    Min (lbs)  12    Max (lbs)  7    Hold Time  60    Rest Time  20    Time  15      Neck Exercises: Stretches   Upper Trapezius Stretch  Left;2 reps;30 seconds   took away Lt UE symptoms   Other Neck Stretches  doorway stretch arms down and mid                   PT Long Term Goals - 10/19/18 1143      PT LONG  TERM GOAL #1   Title  I with advanced HEP ( 11/09/18)     Status  On-going      PT LONG TERM GOAL #2   Title  improve FOTO =/< 46% limited ( 11/09/18)     Status  On-going      PT LONG TERM GOAL #3   Title  report =/> 75% reduction of neck pain and LT UE pain with ADLs ( 11/09/18)     Status  On-going   605 improved     PT LONG TERM GOAL #4   Title  reduce pain to allow her to sleep on her left side per her previous status ( 11/09/18)     Status  On-going      PT LONG TERM GOAL #5   Title  improve Lt grip =/> 35# to allow her to not feel like she is going to drop things. ( 11/09/18)     Status  Achieved            Plan - 10/19/18 1207    Clinical Impression Statement  Heidi Wolf reports her symptoms are still decreasing however she will have them into her Lt UE still. Primarily with positions that close the vertebral space on the left side and with Lt shouulder extension.  Tolerated light pull on cervical traction with relief of arm symptoms.  She still has posutral changes with forward head and rounded shoulders along with pec/scalene/SCM  tighteness.  Heidi Wolf has improved Lt grip strength and has met this goal.     Rehab Potential  Good    PT Frequency  2x / week    PT Duration  6 weeks    PT Treatment/Interventions  Neuromuscular re-education;Dry needling;Manual techniques;Moist Heat;Traction;Ultrasound;Therapeutic activities;Patient/family education;Taping;Therapeutic exercise;Cryotherapy;Electrical Stimulation;Passive range of motion    PT Next Visit Plan  assess response to cervical traction.     Consulted and Agree with Plan of Care  Patient       Patient will benefit from skilled therapeutic intervention in order to improve the following deficits  and impairments:  Pain, Postural dysfunction, Increased muscle spasms, Decreased range of motion, Decreased strength, Impaired UE functional use, Obesity  Visit Diagnosis: Cervicalgia  Muscle weakness (generalized)  Muscle spasm of  back     Problem List Patient Active Problem List   Diagnosis Date Noted  . Sprain of left rotator cuff capsule 05/27/2018  . Thyroid nodule 04/09/2018  . Back pain 04/09/2018  . Type II diabetes mellitus ([place])   . Myocardial infarction ([place])   . Internal hemorrhoid   . Hypertension   . Hyperlipidemia   . GERD (gastroesophageal reflux disease)   . External hemorrhoid   . Colon polyps   . Cataracts, both eyes   . CAD (coronary artery disease)   . Arthritis   . Anxiety   . Anemia   . Allergy   . Fatigue 05/21/2017  . Essential hypertension 09/03/2016  . Dyslipidemia 09/03/2016  . 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 Palpitations 09/03/2016  . Chest pain 07/03/2016  . CAD- S/P PCI July 2017 07/02/2016  . 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 Chest pain at rest 01/17/2014  . Personal history of colonic polyps 03/03/2012  . Internal hemorrhoids without mention of complication 83/67/2550    Jeral Pinch PT  10/19/2018, 12:10 PM  [facility] [address] [place], [place], [address] Phone: [phone]   Fax:  [phone]  Name: Heidi Wolf MRN: [id number] Date of Birth: 09/15/1941

## 2018-10-21 ENCOUNTER — Ambulatory Visit: Payer: Medicare Other | Admitting: Physical Therapy

## 2018-10-21 ENCOUNTER — Encounter: Payer: Self-pay | Admitting: Physical Therapy

## 2018-10-21 DIAGNOSIS — M542 Cervicalgia: Secondary | ICD-10-CM | POA: Diagnosis not present

## 2018-10-21 DIAGNOSIS — M62830 Muscle spasm of back: Secondary | ICD-10-CM

## 2018-10-21 DIAGNOSIS — M6281 Muscle weakness (generalized): Secondary | ICD-10-CM | POA: Diagnosis not present

## 2018-10-21 NOTE — Therapy (Signed)
[place], [place], [address] Phone: [phone]   Fax:  [phone]  Physical Therapy Treatment  Patient Details  Name: Heidi Wolf MRN: [id number] Date of Birth: Sep 07, 1941 Referring Provider (PT): Dr Coletta Memos   Encounter Date: 10/21/2018  PT End of Session - 10/21/18 1216    Visit Number  4    Number of Visits  12    Date for PT Re-Evaluation  11/09/18    PT Start Time  8850    PT Stop Time  1230    PT Time Calculation (min)  45 min       Past Medical History:  Diagnosis Date  . Anemia   . Anxiety   . Arthritis    "right hip" (09/21/2014)  . CAD (coronary artery disease)    PCI 90% diagonal stenosis witha DES July 2017.  Nonobstructive disease elsewhere.   . Cataracts, both eyes   . Colon polyps    Hyperplastic 2003  . External hemorrhoid   . GERD (gastroesophageal reflux disease)   . Glaucoma   . Hyperlipidemia   . Hypertension   . Internal hemorrhoid   . Personal history of colonic polyps 03/03/2012  . Type II diabetes mellitus ([place])    type 2    Past Surgical History:  Procedure Laterality Date  . ABDOMINAL HYSTERECTOMY    . APPENDECTOMY     "w/gallbladder"  . CARDIAC CATHETERIZATION  X 2  . CARDIAC CATHETERIZATION N/A 07/03/2016   Procedure: Left Heart Cath and Coronary Angiography;  Surgeon: Dixie Dials, MD;  Location: [facility];  Service: Cardiovascular;  Laterality: N/A;  . CARDIAC CATHETERIZATION N/A 07/03/2016   Procedure: Coronary Stent Intervention;  Surgeon: Jettie Booze, MD;  Location: [facility];  Service: Cardiovascular;  Laterality: N/A;  . CARDIAC CATHETERIZATION N/A 07/15/2016   Procedure: Left Heart Cath and Coronary Angiography;  Surgeon: Dixie Dials, MD;  Location: [facility];  Service: Cardiovascular;  Laterality: N/A;  . CHOLECYSTECTOMY    . COLONOSCOPY    . CORONARY ANGIOPLASTY WITH STENT PLACEMENT  06/2016  . TUBAL  LIGATION  1971    There were no vitals filed for this visit.  Subjective Assessment - 10/21/18 1148    Subjective  I am doing some better, the  pain is not as intense but still travels down left arm to hand.     Currently in Pain?  Yes    Pain Score  5     Pain Location  Neck    Pain Orientation  Left    Pain Descriptors / Indicators  Sore;Tingling    Pain Type  Chronic pain    Pain Radiating Towards  into left hand     Pain Onset  More than a month ago    Pain Frequency  Intermittent    Effect of Pain on Daily Activities  cant lay on left side                        [facility] Adult PT Treatment/Exercise - 10/21/18 0001      Exercises   Exercises  Neck      Neck Exercises: Seated   W Back  10 reps    W Back Limitations  from neutral     Other Seated Exercise  yellow band horizontal and ER x 10 each cues for posture    Other Seated Exercise  scap squeeze  with tactile cues       Neck Exercises: Supine   Neck Retraction  15 reps   head presses   Other Supine Exercise  15 reps, yellow band overhead pull, horizontal abduction, ER    Other Supine Exercise  15 reps head and shoulder presses, shoulder rolls BWD      Traction   Type of Traction  Cervical    Min (lbs)  14    Max (lbs)  7    Hold Time  60    Rest Time  10    Time  10      Neck Exercises: Stretches   Other Neck Stretches  --    Other Neck Stretches  scalenes stretch                  PT Long Term Goals - 10/19/18 1143      PT LONG TERM GOAL #1   Title  I with advanced HEP ( 11/09/18)     Status  On-going      PT LONG TERM GOAL #2   Title  improve FOTO =/< 46% limited ( 11/09/18)     Status  On-going      PT LONG TERM GOAL #3   Title  report =/> 75% reduction of neck pain and LT UE pain with ADLs ( 11/09/18)     Status  On-going   605 improved     PT LONG TERM GOAL #4   Title  reduce pain to allow her to sleep on her left side per her previous status ( 11/09/18)     Status   On-going      PT LONG TERM GOAL #5   Title  improve Lt grip =/> 35# to allow her to not feel like she is going to drop things. ( 11/09/18)     Status  Achieved            Plan - 10/21/18 1217    Clinical Impression Statement  Pt reports she is a little better since first starting PT. Continues to rate neck and arm pain at 5/10. Continued with neck and scap stability exercises in seated and supine. She reports cervical traction uncomfortable last visit but not enough to ring the bell. Time spent on making her comfortable and monitoring her during mechanical traction today. She reports decreased symptoms during traction however not completely resolved.     PT Next Visit Plan  assess response to second cervical traction; cont neck and scap stab    PT Home Exercise Plan  seated chin tuck , scap squeeze, shoulder rolls, seated shoulder ER/retraction from neutral and from 90/90,     Consulted and Agree with Plan of Care  Patient       Patient will benefit from skilled therapeutic intervention in order to improve the following deficits and impairments:  Pain, Postural dysfunction, Increased muscle spasms, Decreased range of motion, Decreased strength, Impaired UE functional use, Obesity  Visit Diagnosis: Cervicalgia  Muscle weakness (generalized)  Muscle spasm of back     Problem List Patient Active Problem List   Diagnosis Date Noted  . Sprain of left rotator cuff capsule 05/27/2018  . Thyroid nodule 04/09/2018  . Back pain 04/09/2018  . Type II diabetes mellitus ([place])   . Myocardial infarction ([place])   . Internal hemorrhoid   . Hypertension   . Hyperlipidemia   . GERD (gastroesophageal reflux disease)   . External hemorrhoid   . Colon polyps   .  Cataracts, both eyes   . CAD (coronary artery disease)   . Arthritis   . Anxiety   . Anemia   . Allergy   . Fatigue 05/21/2017  . Essential hypertension 09/03/2016  . Dyslipidemia 09/03/2016  . 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 Palpitations 09/03/2016  . Chest pain 07/03/2016  . CAD- S/P PCI July 2017 07/02/2016  . 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 Chest pain at rest 01/17/2014  . Personal history of colonic polyps 03/03/2012  . Internal hemorrhoids without mention of complication 97/33/1250    Dorene Ar, PTA 10/21/2018, 12:25 PM  [facility] [address] [place], [place], [address] Phone: [phone]   Fax:  [phone]  Name: ARNETRA TERRIS MRN: [id number] Date of Birth: Apr 24, 1941

## 2018-10-25 ENCOUNTER — Other Ambulatory Visit: Payer: Self-pay | Admitting: Cardiology

## 2018-10-26 ENCOUNTER — Encounter: Payer: Self-pay | Admitting: Physical Therapy

## 2018-10-26 ENCOUNTER — Ambulatory Visit: Payer: Medicare Other | Admitting: Physical Therapy

## 2018-10-26 DIAGNOSIS — M62830 Muscle spasm of back: Secondary | ICD-10-CM | POA: Diagnosis not present

## 2018-10-26 DIAGNOSIS — M6281 Muscle weakness (generalized): Secondary | ICD-10-CM

## 2018-10-26 DIAGNOSIS — M542 Cervicalgia: Secondary | ICD-10-CM | POA: Diagnosis not present

## 2018-10-26 NOTE — Therapy (Signed)
[place], [place], [address] Phone: [phone]   Fax:  [phone]  Physical Therapy Treatment  Patient Details  Name: Heidi Wolf MRN: [id number] Date of Birth: 10-15-1941 Referring Provider (PT): Dr Coletta Memos   Encounter Date: 10/26/2018  PT End of Session - 10/26/18 1151    Visit Number  5    Number of Visits  12    Date for PT Re-Evaluation  11/09/18    PT Start Time  1761    PT Stop Time  1225    PT Time Calculation (min)  40 min       Past Medical History:  Diagnosis Date  . Anemia   . Anxiety   . Arthritis    "right hip" (09/21/2014)  . CAD (coronary artery disease)    PCI 90% diagonal stenosis witha DES July 2017.  Nonobstructive disease elsewhere.   . Cataracts, both eyes   . Colon polyps    Hyperplastic 2003  . External hemorrhoid   . GERD (gastroesophageal reflux disease)   . Glaucoma   . Hyperlipidemia   . Hypertension   . Internal hemorrhoid   . Personal history of colonic polyps 03/03/2012  . Type II diabetes mellitus ([place])    type 2    Past Surgical History:  Procedure Laterality Date  . ABDOMINAL HYSTERECTOMY    . APPENDECTOMY     "w/gallbladder"  . CARDIAC CATHETERIZATION  X 2  . CARDIAC CATHETERIZATION N/A 07/03/2016   Procedure: Left Heart Cath and Coronary Angiography;  Surgeon: Dixie Dials, MD;  Location: [facility];  Service: Cardiovascular;  Laterality: N/A;  . CARDIAC CATHETERIZATION N/A 07/03/2016   Procedure: Coronary Stent Intervention;  Surgeon: Jettie Booze, MD;  Location: [facility];  Service: Cardiovascular;  Laterality: N/A;  . CARDIAC CATHETERIZATION N/A 07/15/2016   Procedure: Left Heart Cath and Coronary Angiography;  Surgeon: Dixie Dials, MD;  Location: [facility];  Service: Cardiovascular;  Laterality: N/A;  . CHOLECYSTECTOMY    . COLONOSCOPY    . CORONARY ANGIOPLASTY WITH STENT PLACEMENT  06/2016  . TUBAL  LIGATION  1971    There were no vitals filed for this visit.  Subjective Assessment - 10/26/18 1148    Subjective  Was sore in the neck after traction but slept better. Pain intensity in arm not as intense.     Currently in Pain?  Yes    Pain Score  5     Pain Location  Neck    Pain Orientation  Left    Pain Descriptors / Indicators  --   stiffness, queer feeling   Aggravating Factors   sleeping on left side    Pain Relieving Factors  left arm on pillow                        [facility] Adult PT Treatment/Exercise - 10/26/18 0001      Neck Exercises: Seated   W Back  10 reps    Other Seated Exercise  yellow band horizontal and ER x 10 each cues for posture    Other Seated Exercise  scap squeeze with tactile cues       Neck Exercises: Supine   Neck Retraction  15 reps   head presses   Other Supine Exercise  15 reps, yellow band overhead pull, horizontal abduction, ER    Other Supine Exercise  shoulder presses  Manual Therapy   Manual therapy comments  passive upper trap and levator stretch    Manual Traction  gentle cervical traction - takes away the Lt UE sypmtoms                  PT Long Term Goals - 10/19/18 1143      PT LONG TERM GOAL #1   Title  I with advanced HEP ( 11/09/18)     Status  On-going      PT LONG TERM GOAL #2   Title  improve FOTO =/< 46% limited ( 11/09/18)     Status  On-going      PT LONG TERM GOAL #3   Title  report =/> 75% reduction of neck pain and LT UE pain with ADLs ( 11/09/18)     Status  On-going   605 improved     PT LONG TERM GOAL #4   Title  reduce pain to allow her to sleep on her left side per her previous status ( 11/09/18)     Status  On-going      PT LONG TERM GOAL #5   Title  improve Lt grip =/> 35# to allow her to not feel like she is going to drop things. ( 11/09/18)     Status  Achieved            Plan - 10/26/18 1241    Clinical Impression Statement  Pt reports increased neck  soreness after cervical traction last visit. She did sleep better afterward using a pillow for arm positioning, She is still unable to lie on left side. Continued with scapular and neck stabilization exercises. cervical traction perfromed manually today due to soreness response after the last 2 visits. She reports a decrease in radicular pain after several reps.     PT Next Visit Plan  manual cervical traction verses mechanical (has increased neck soreness afterward) ; ; cont neck and scap stab, soft tissue work left neck and upper trap    PT Home Exercise Plan  seated chin tuck , scap squeeze, shoulder rolls, seated shoulder ER/retraction from neutral and from 90/90,     Consulted and Agree with Plan of Care  Patient       Patient will benefit from skilled therapeutic intervention in order to improve the following deficits and impairments:  Pain, Postural dysfunction, Increased muscle spasms, Decreased range of motion, Decreased strength, Impaired UE functional use, Obesity  Visit Diagnosis: Cervicalgia  Muscle weakness (generalized)  Muscle spasm of back     Problem List Patient Active Problem List   Diagnosis Date Noted  . Sprain of left rotator cuff capsule 05/27/2018  . Thyroid nodule 04/09/2018  . Back pain 04/09/2018  . Type II diabetes mellitus ([place])   . Myocardial infarction ([place])   . Internal hemorrhoid   . Hypertension   . Hyperlipidemia   . GERD (gastroesophageal reflux disease)   . External hemorrhoid   . Colon polyps   . Cataracts, both eyes   . CAD (coronary artery disease)   . Arthritis   . Anxiety   . Anemia   . Allergy   . Fatigue 05/21/2017  . Essential hypertension 09/03/2016  . Dyslipidemia 09/03/2016  . 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[place]) 09/03/2016  . Palpitations 09/03/2016  . Chest pain 07/03/2016  . CAD- S/P PCI July 2017 07/02/2016  . 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 Chest pain at rest  01/17/2014  . Personal history of colonic polyps 03/03/2012  . Internal hemorrhoids without mention of complication 26/33/3545    Dorene Ar , PTA 10/26/2018, 12:52 PM  [facility] [address] [place], [place], [address] Phone: [phone]   Fax:  [phone]  Name: Heidi Wolf MRN: [id number] Date of Birth: [birth year]

## 2018-10-28 ENCOUNTER — Ambulatory Visit: Payer: Medicare Other | Admitting: Physical Therapy

## 2018-10-28 ENCOUNTER — Encounter: Payer: Self-pay | Admitting: Physical Therapy

## 2018-10-28 DIAGNOSIS — M6281 Muscle weakness (generalized): Secondary | ICD-10-CM | POA: Diagnosis not present

## 2018-10-28 DIAGNOSIS — M542 Cervicalgia: Secondary | ICD-10-CM | POA: Diagnosis not present

## 2018-10-28 DIAGNOSIS — M62830 Muscle spasm of back: Secondary | ICD-10-CM | POA: Diagnosis not present

## 2018-10-28 NOTE — Therapy (Signed)
[place], [place], [address] Phone: [phone]   Fax:  [phone]  Physical Therapy Treatment  Patient Details  Name: Heidi Wolf MRN: [id number] Date of Birth: 1941-11-22 Referring Provider (PT): Dr Coletta Memos   Encounter Date: 10/28/2018  PT End of Session - 10/28/18 1322    Visit Number  6    Number of Visits  12    Date for PT Re-Evaluation  11/09/18    PT Start Time  7353    PT Stop Time  1229    PT Time Calculation (min)  44 min    Activity Tolerance  Patient tolerated treatment well    Behavior During Therapy  [facility] for tasks assessed/performed       Past Medical History:  Diagnosis Date  . Anemia   . Anxiety   . Arthritis    "right hip" (09/21/2014)  . CAD (coronary artery disease)    PCI 90% diagonal stenosis witha DES July 2017.  Nonobstructive disease elsewhere.   . Cataracts, both eyes   . Colon polyps    Hyperplastic 2003  . External hemorrhoid   . GERD (gastroesophageal reflux disease)   . Glaucoma   . Hyperlipidemia   . Hypertension   . Internal hemorrhoid   . Personal history of colonic polyps 03/03/2012  . Type II diabetes mellitus ([place])    type 2    Past Surgical History:  Procedure Laterality Date  . ABDOMINAL HYSTERECTOMY    . APPENDECTOMY     "w/gallbladder"  . CARDIAC CATHETERIZATION  X 2  . CARDIAC CATHETERIZATION N/A 07/03/2016   Procedure: Left Heart Cath and Coronary Angiography;  Surgeon: Dixie Dials, MD;  Location: [facility];  Service: Cardiovascular;  Laterality: N/A;  . CARDIAC CATHETERIZATION N/A 07/03/2016   Procedure: Coronary Stent Intervention;  Surgeon: Jettie Booze, MD;  Location: [facility];  Service: Cardiovascular;  Laterality: N/A;  . CARDIAC CATHETERIZATION N/A 07/15/2016   Procedure: Left Heart Cath and Coronary Angiography;  Surgeon: Dixie Dials, MD;  Location: [facility];  Service: Cardiovascular;   Laterality: N/A;  . CHOLECYSTECTOMY    . COLONOSCOPY    . CORONARY ANGIOPLASTY WITH STENT PLACEMENT  06/2016  . TUBAL LIGATION  1971    There were no vitals filed for this visit.  Subjective Assessment - 10/28/18 1149    Subjective  Pain into arm is not as bad.  I am sleeping better.      Currently in Pain?  Yes    Pain Score  5     Pain Location  Neck    Pain Orientation  Left    Pain Type  Chronic pain    Pain Radiating Towards  into left hand   all fingers with thumb worst    Pain Frequency  Intermittent    Aggravating Factors   sleeping on the left side,  pushing the basket in store,  vacume    Pain Relieving Factors  PT,  sitting with good posture,  stretches.     Effect of Pain on Daily Activities  can's sleep in left side,  lifting pushing limited                       [facility] Adult PT Treatment/Exercise - 10/28/18 0001      Neck Exercises: Seated   Other Seated Exercise  scap squeeze      Neck Exercises: Supine  Other Supine Exercise  ER, horizontal abd, sash, narrow grip  10 xeach fatigue   yellow band min to mod cues   Other Supine Exercise  decompression 2 minutes sholder press 5 x head press 10 X  leg lengthener   5 X 5 seconds and leg press 5 x 5 seconds.  no pain,  needed cued for lighter head press. and 1 pillow under left leg .      Manual Therapy   Manual Traction  gentle cervicle traction  slight increased lat neck pain ,  No SX into arm prior to traction      Neck Exercises: Stretches   Upper Trapezius Stretch  1 rep;10 seconds;Left    Other Neck Stretches  single arm door way 3 X 30             PT Education - 10/28/18 1321    Education Details  Exercise form    Person(s) Educated  Patient    Methods  Explanation;Demonstration;Tactile cues;Verbal cues    Comprehension  Verbalized understanding;Returned demonstration          PT Long Term Goals - 10/28/18 1324      PT LONG TERM GOAL #1   Title  I with advanced HEP (  11/09/18)     Baseline  able to do current with instructions    Time  6    Period  Weeks    Status  On-going      PT LONG TERM GOAL #2   Title  improve FOTO =/< 46% limited ( 11/09/18)     Time  6    Period  Weeks    Status  On-going      PT LONG TERM GOAL #3   Title  report =/> 75% reduction of neck pain and LT UE pain with ADLs ( 11/09/18)     Baseline  30-35% reduction    Time  6    Period  Weeks    Status  On-going      PT LONG TERM GOAL #4   Title  reduce pain to allow her to sleep on her left side per her previous status ( 11/09/18)     Baseline  slightly improved    Time  6    Period  Weeks    Status  On-going      PT LONG TERM GOAL #5   Title  improve Lt grip =/> 35# to allow her to not feel like she is going to drop things. ( 11/09/18)     Time  6    Period  Weeks    Status  Achieved            Plan - 10/28/18 1323    Clinical Impression Statement  Patient was able to decrease UE symptoms with supine exercise today.  manual light traction increased left neck pain X 2 so stopped.  LTG#3 partly met with pain improved 30 to 35 %.     PT Next Visit Plan  manual cervical traction verses mechanical (has increased neck soreness afterward) ; ; cont neck and scap stab, soft tissue work left neck and upper trap    PT Home Exercise Plan  seated chin tuck , scap squeeze, shoulder rolls, seated shoulder ER/retraction from neutral and from 90/90,     Consulted and Agree with Plan of Care  Patient       Patient will benefit from skilled therapeutic intervention in order to improve the following deficits  and impairments:     Visit Diagnosis: Cervicalgia  Muscle weakness (generalized)  Muscle spasm of back     Problem List Patient Active Problem List   Diagnosis Date Noted  . Sprain of left rotator cuff capsule 05/27/2018  . Thyroid nodule 04/09/2018  . Back pain 04/09/2018  . Type II diabetes mellitus ([place])   . Myocardial infarction ([place])   . Internal  hemorrhoid   . Hypertension   . Hyperlipidemia   . GERD (gastroesophageal reflux disease)   . External hemorrhoid   . Colon polyps   . Cataracts, both eyes   . CAD (coronary artery disease)   . Arthritis   . Anxiety   . Anemia   . Allergy   . Fatigue 05/21/2017  . Essential hypertension 09/03/2016  . Dyslipidemia 09/03/2016  . 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[place]) 09/03/2016  . Palpitations 09/03/2016  . Chest pain 07/03/2016  . CAD- S/P PCI July 2017 07/02/2016  . 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 Chest pain at rest 01/17/2014  . Personal history of colonic polyps 03/03/2012  . Internal hemorrhoids without mention of complication 10/28/5944    Sahil Milner  PTA 10/28/2018, 1:27 PM  [facility] [address] [place], [place], [address] Phone: [phone]   Fax:  [phone]  Name: JURLINE FOLGER MRN: [id number] Date of Birth: Oct 14, 1941

## 2018-11-02 ENCOUNTER — Encounter: Payer: Medicare Other | Admitting: Physical Therapy

## 2018-11-04 ENCOUNTER — Ambulatory Visit: Payer: Medicare Other | Attending: Family Medicine | Admitting: Physical Therapy

## 2018-11-04 ENCOUNTER — Encounter: Payer: Self-pay | Admitting: Physical Therapy

## 2018-11-04 DIAGNOSIS — M542 Cervicalgia: Secondary | ICD-10-CM | POA: Insufficient documentation

## 2018-11-04 DIAGNOSIS — M62830 Muscle spasm of back: Secondary | ICD-10-CM | POA: Diagnosis not present

## 2018-11-04 DIAGNOSIS — M6281 Muscle weakness (generalized): Secondary | ICD-10-CM

## 2018-11-04 NOTE — Therapy (Addendum)
[place], [place], [address] Phone: [phone]   Fax:  [phone]  Physical Therapy Treatment  Patient Details  Name: Heidi Wolf MRN: [id number] Date of Birth: 1941/07/01 Referring Provider (PT): Dr Coletta Memos   Encounter Date: 11/04/2018  PT End of Session - 11/04/18 1148    Visit Number  7    Number of Visits  12    Date for PT Re-Evaluation  11/09/18    PT Start Time  2426    PT Stop Time  1230    PT Time Calculation (min)  45 min       Past Medical History:  Diagnosis Date  . Anemia   . Anxiety   . Arthritis    "right hip" (09/21/2014)  . CAD (coronary artery disease)    PCI 90% diagonal stenosis witha DES July 2017.  Nonobstructive disease elsewhere.   . Cataracts, both eyes   . Colon polyps    Hyperplastic 2003  . External hemorrhoid   . GERD (gastroesophageal reflux disease)   . Glaucoma   . Hyperlipidemia   . Hypertension   . Internal hemorrhoid   . Personal history of colonic polyps 03/03/2012  . Type II diabetes mellitus ([place])    type 2    Past Surgical History:  Procedure Laterality Date  . ABDOMINAL HYSTERECTOMY    . APPENDECTOMY     "w/gallbladder"  . CARDIAC CATHETERIZATION  X 2  . CARDIAC CATHETERIZATION N/A 07/03/2016   Procedure: Left Heart Cath and Coronary Angiography;  Surgeon: Dixie Dials, MD;  Location: [facility];  Service: Cardiovascular;  Laterality: N/A;  . CARDIAC CATHETERIZATION N/A 07/03/2016   Procedure: Coronary Stent Intervention;  Surgeon: Jettie Booze, MD;  Location: [facility];  Service: Cardiovascular;  Laterality: N/A;  . CARDIAC CATHETERIZATION N/A 07/15/2016   Procedure: Left Heart Cath and Coronary Angiography;  Surgeon: Dixie Dials, MD;  Location: [facility];  Service: Cardiovascular;  Laterality: N/A;  . CHOLECYSTECTOMY    . COLONOSCOPY    . CORONARY ANGIOPLASTY WITH STENT PLACEMENT  06/2016  . TUBAL  LIGATION  1971    There were no vitals filed for this visit.  Subjective Assessment - 11/04/18 1147    Subjective  No pain right now. A little sore in left arm. Overall pain is better and I am not taking as much tylenol.     Currently in Pain?  No/denies         [facility] PT Assessment - 11/04/18 0001      Observation/Other Assessments   Focus on Therapeutic Outcomes (FOTO)   25% limited                   [facility] Adult PT Treatment/Exercise - 11/04/18 0001      Neck Exercises: Seated   W Back  10 reps    Other Seated Exercise  scap squeeze      Neck Exercises: Supine   Neck Retraction  15 reps   head presses   Other Supine Exercise  ER, horizontal abd, sash, narrow grip  10 xeach fatigue   yellow band min to mod cues   Other Supine Exercise  decompression 2 minutes sholder press 5 x head press 10 X  leg lengthener   5 X 5 seconds and leg press 5 x 5 seconds.  no pain,  needed cued for lighter head press. and 1 pillow under left leg .  Neck Exercises: Stretches   Upper Trapezius Stretch  1 rep;10 seconds;Left    Levator Stretch  1 rep;10 seconds;Left                  PT Long Term Goals - 11/04/18 1213      PT LONG TERM GOAL #1   Title  I with advanced HEP ( 11/09/18)     Baseline  able to do current with instructions    Time  6    Period  Weeks    Status  Achieved      PT LONG TERM GOAL #2   Title  improve FOTO =/< 46% limited ( 11/09/18)     Baseline  25% limited    Time  6    Period  Weeks    Status  Achieved      PT LONG TERM GOAL #3   Title  report =/> 75% reduction of neck pain and LT UE pain with ADLs ( 11/09/18)     Baseline  90% reduction     Time  6    Period  Weeks    Status  Achieved      PT LONG TERM GOAL #4   Title  reduce pain to allow her to sleep on her left side per her previous status ( 11/09/18)     Baseline  has changed her sleeping positions and is sleeping well- extends arm on pillow     Time  6    Period  Weeks     Status  Partially Met      PT LONG TERM GOAL #5   Title  improve Lt grip =/> 35# to allow her to not feel like she is going to drop things. ( 11/09/18)     Time  6    Period  Weeks    Status  Achieved            Plan - 11/04/18 1221    Clinical Impression Statement  Pt reports 90% improvement in UE sx and pain. FOTO score improved to 25% limitation. She is independent with her HEP. She does not sleep on left side anymore and is pleased with her current sleep. Most LTGs met or partially met.     PT Next Visit Plan  discharge today    PT Home Exercise Plan  seated chin tuck , scap squeeze, shoulder rolls, seated shoulder ER/retraction from neutral and from 90/90, supine scap stab yellow band     Consulted and Agree with Plan of Care  Patient       Patient will benefit from skilled therapeutic intervention in order to improve the following deficits and impairments:  Pain, Postural dysfunction, Increased muscle spasms, Decreased range of motion, Decreased strength, Impaired UE functional use, Obesity  Visit Diagnosis: Cervicalgia  Muscle weakness (generalized)  Muscle spasm of back     Problem List Patient Active Problem List   Diagnosis Date Noted  . Sprain of left rotator cuff capsule 05/27/2018  . Thyroid nodule 04/09/2018  . Back pain 04/09/2018  . Type II diabetes mellitus ([place])   . Myocardial infarction ([place])   . Internal hemorrhoid   . Hypertension   . Hyperlipidemia   . GERD (gastroesophageal reflux disease)   . External hemorrhoid   . Colon polyps   . Cataracts, both eyes   . CAD (coronary artery disease)   . Arthritis   . Anxiety   . Anemia   . Allergy   .  Fatigue 05/21/2017  . Essential hypertension 09/03/2016  . Dyslipidemia 09/03/2016  . 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 Palpitations 09/03/2016  . Chest pain 07/03/2016  . CAD- S/P PCI July 2017 07/02/2016  . 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 Chest pain at rest 01/17/2014  . Personal history of colonic polyps 03/03/2012  . Internal hemorrhoids without mention of complication 61/96/9409    Dorene Ar , PTA 11/04/2018, 12:38 PM  [facility] [address] [place], [place], [address] Phone: [phone]   Fax:  [phone]  Name: Heidi Wolf MRN: [id number] Date of Birth: [birth year]   PHYSICAL THERAPY DISCHARGE SUMMARY  Visits from Start of Care: 7  Current functional level related to goals / functional outcomes: See above   Remaining deficits: none   Education / Equipment: HEP Plan: Patient agrees to discharge.  Patient goals were partially met. Patient is being discharged due to being pleased with the current functional level.  ?????    Jeral Pinch, PT 12/15/18 12:49 PM

## 2018-11-09 ENCOUNTER — Encounter: Payer: Medicare Other | Admitting: Physical Therapy

## 2018-11-09 ENCOUNTER — Other Ambulatory Visit: Payer: Self-pay

## 2018-11-09 ENCOUNTER — Ambulatory Visit (INDEPENDENT_AMBULATORY_CARE_PROVIDER_SITE_OTHER): Payer: Medicare Other | Admitting: Family Medicine

## 2018-11-09 ENCOUNTER — Encounter: Payer: Self-pay | Admitting: Family Medicine

## 2018-11-09 VITALS — BP 138/66 | HR 63 | Temp 98.2°F | Wt 167.0 lb

## 2018-11-09 DIAGNOSIS — E1159 Type 2 diabetes mellitus with other circulatory complications: Secondary | ICD-10-CM | POA: Diagnosis not present

## 2018-11-09 DIAGNOSIS — M5090 Cervical disc disorder, unspecified, unspecified cervical region: Secondary | ICD-10-CM

## 2018-11-09 DIAGNOSIS — I10 Essential (primary) hypertension: Secondary | ICD-10-CM

## 2018-11-09 DIAGNOSIS — K219 Gastro-esophageal reflux disease without esophagitis: Secondary | ICD-10-CM

## 2018-11-09 NOTE — Assessment & Plan Note (Signed)
Controlled, 138/66 today (goal for patient:140/90)  -Continue benazepril, amlodipine, and metoprolol

## 2018-11-09 NOTE — Patient Instructions (Signed)
It was so nice meeting you today! I'm so glad your shoulder is doing better, continue your exercises at home. We have discontinued your protonix today, you may use over the counter antacids as needed if you have symptoms. Lastly, start checking your sugars once weekly (instead of daily) and keep a log to bring into your follow up!   Have a wonderful day and holiday!

## 2018-11-09 NOTE — Assessment & Plan Note (Signed)
Stable, diagnosed in 2017 prior to cardiac history.  Not endorsing any symptoms currently.  Will trial off of Protonix given increased risk of vitamin deficiencies, osteoporosis, and C. difficile.  Instructed she may use over-the-counter antacids if needed and discuss further if symptoms return or worsen in the meantime.

## 2018-11-09 NOTE — Assessment & Plan Note (Addendum)
Chronic, much improved today, following 6 weeks of PT. She is to continue her exercises at home as instructed by physical therapy and may use Tylenol and blue emu cream on an as-needed basis.  -Follow-up if return/worsening of symptoms

## 2018-11-09 NOTE — Progress Notes (Addendum)
Subjective:    Patient ID: Heidi Wolf, female    DOB: June 26, 1941, 77 y.o.   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  She states she has a 90% improvement in her symptoms, no longer having numbness and tingling down her arm and increased range of motion.  She is happy with these results and is continuing to do exercises at home.  Continues to use Tylenol sporadically and blue emu cream with good relief.  Diabetes: Patient previously taking glipizide 2.5 mg, has not taken this since May.  A1c in September was 5.9. Has been checking her fasting glucose every day, ranging 95-125 with an average of 105.  Denies any hypoglycemic symptoms.  Hypertension: Endorses compliance with benazepril, metoprolol, and amlodipine.   Smoking status reviewed  Review of Systems Per HPI, also denies recent illness, fever, headache, changes in vision, chest Wolf, shortness of breath, abdominal Wolf, N/V/D, weakness   Patient Active Problem List   Diagnosis Date Noted  . Sprain of left rotator cuff capsule 05/27/2018  . Thyroid nodule 04/09/2018  . Myocardial infarction ([place])   . GERD (gastroesophageal reflux disease)   . External hemorrhoid   . Cataracts, both eyes   . CAD (coronary artery disease)   . Arthritis   . Anemia   . Allergy   . Essential hypertension 09/03/2016  . Dyslipidemia 09/03/2016  . 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 Palpitations 09/03/2016  . CAD- S/P PCI July 2017 07/02/2016  . Cervical neck Wolf with evidence of disc disease 09/21/2014  . Personal history of colonic polyps 03/03/2012  . Internal hemorrhoids without mention of complication 42/68/3419     Objective:  BP 138/66   Pulse 63   Temp 98.2 F  (36.8 C) (Oral)   Wt 167 lb (75.8 kg)   SpO2 98%   BMI 28.67 kg/m  Vitals and nursing note reviewed  General: NAD, pleasant Cardiac: RRR, normal heart QQIWLN,9/8 systolic murmur Respiratory: CTAB, normal effort Abdomen: soft, nontender, nondistended Extremities: no edema or cyanosis. WWP. 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 She is to continue her exercises at home as instructed by physical therapy and may use Tylenol and blue emu cream on an as-needed basis.  -Follow-up if return/worsening of symptoms  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HCC) Well-controlled, A1c 5.9 in 08/2018.  Not on any controlling medication since May, previously on glipizide 2.5 mg.  -Check fasting glucose weekly instead of daily, bring log at follow-up visit -Performed diabetic foot exam today, within normal limits -Follows with ophthalmology for cataracts, will do a dilated eye exam soon -Attempted to obtain Urine microalbumin today- however patient not able  to urinate, will do on f/u  -Follow-up with A1c and BMP (monitor kidney function) in 2 months  Essential hypertension Controlled, 138/66 today (goal for patient:140/90)  -Continue benazepril, amlodipine, and metoprolol  GERD (gastroesophageal reflux disease) Stable, diagnosed in 2017 prior to cardiac history.  Not endorsing any symptoms currently.   Will trial off of Protonix given increased risk of vitamin deficiencies, osteoporosis, and C. difficile.  Instructed she may use over-the-counter antacids if needed and discuss further if symptoms return or worsen in the meantime.   Darrelyn Hillock, DO Family Medicine Resident PGY-1

## 2018-11-09 NOTE — Assessment & Plan Note (Addendum)
Well-controlled, A1c 5.9 in 08/2018.  Not on any controlling medication since May, previously on glipizide 2.5 mg.  -Check fasting glucose weekly instead of daily, bring log at follow-up visit -Performed diabetic foot exam today, within normal limits -Follows with ophthalmology for cataracts, will do a dilated eye exam soon -Attempted to obtain Urine microalbumin today- however patient not able to urinate, will do on f/u  -Follow-up with A1c and BMP (monitor kidney function) in 2 months

## 2018-11-11 ENCOUNTER — Ambulatory Visit: Payer: Medicare Other | Admitting: Physical Therapy

## 2018-11-15 IMAGING — CR DG CHEST 2V
2 series · 2 of 2 positions shown · non-contrast
Comparison: Chest radiographs 09/15/2017 and earlier.

CLINICAL DATA: 76-year-old female with chest pain and central chest
pressure since yesterday. Pain radiating to the left neck and
shoulder. Mild shortness of breath.

EXAM:
CHEST - 2 VIEW

[chest lat]
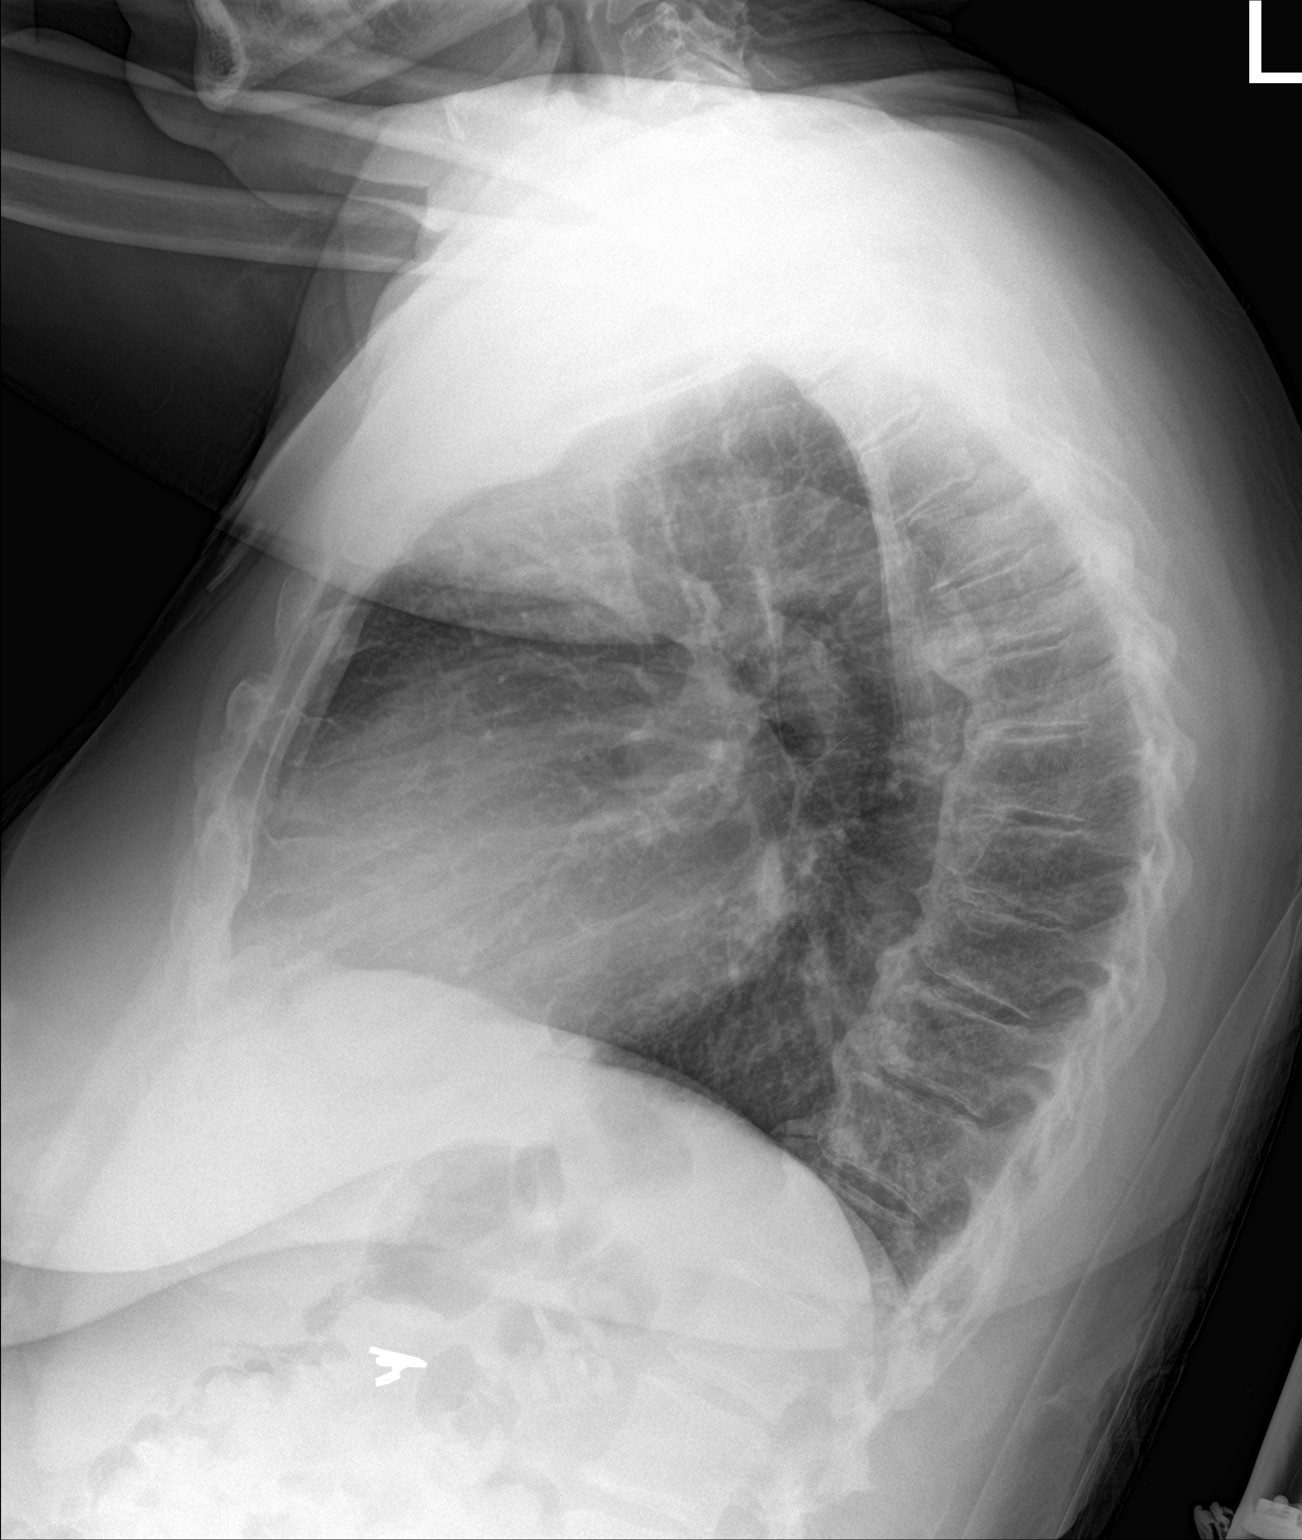

[chest ap]
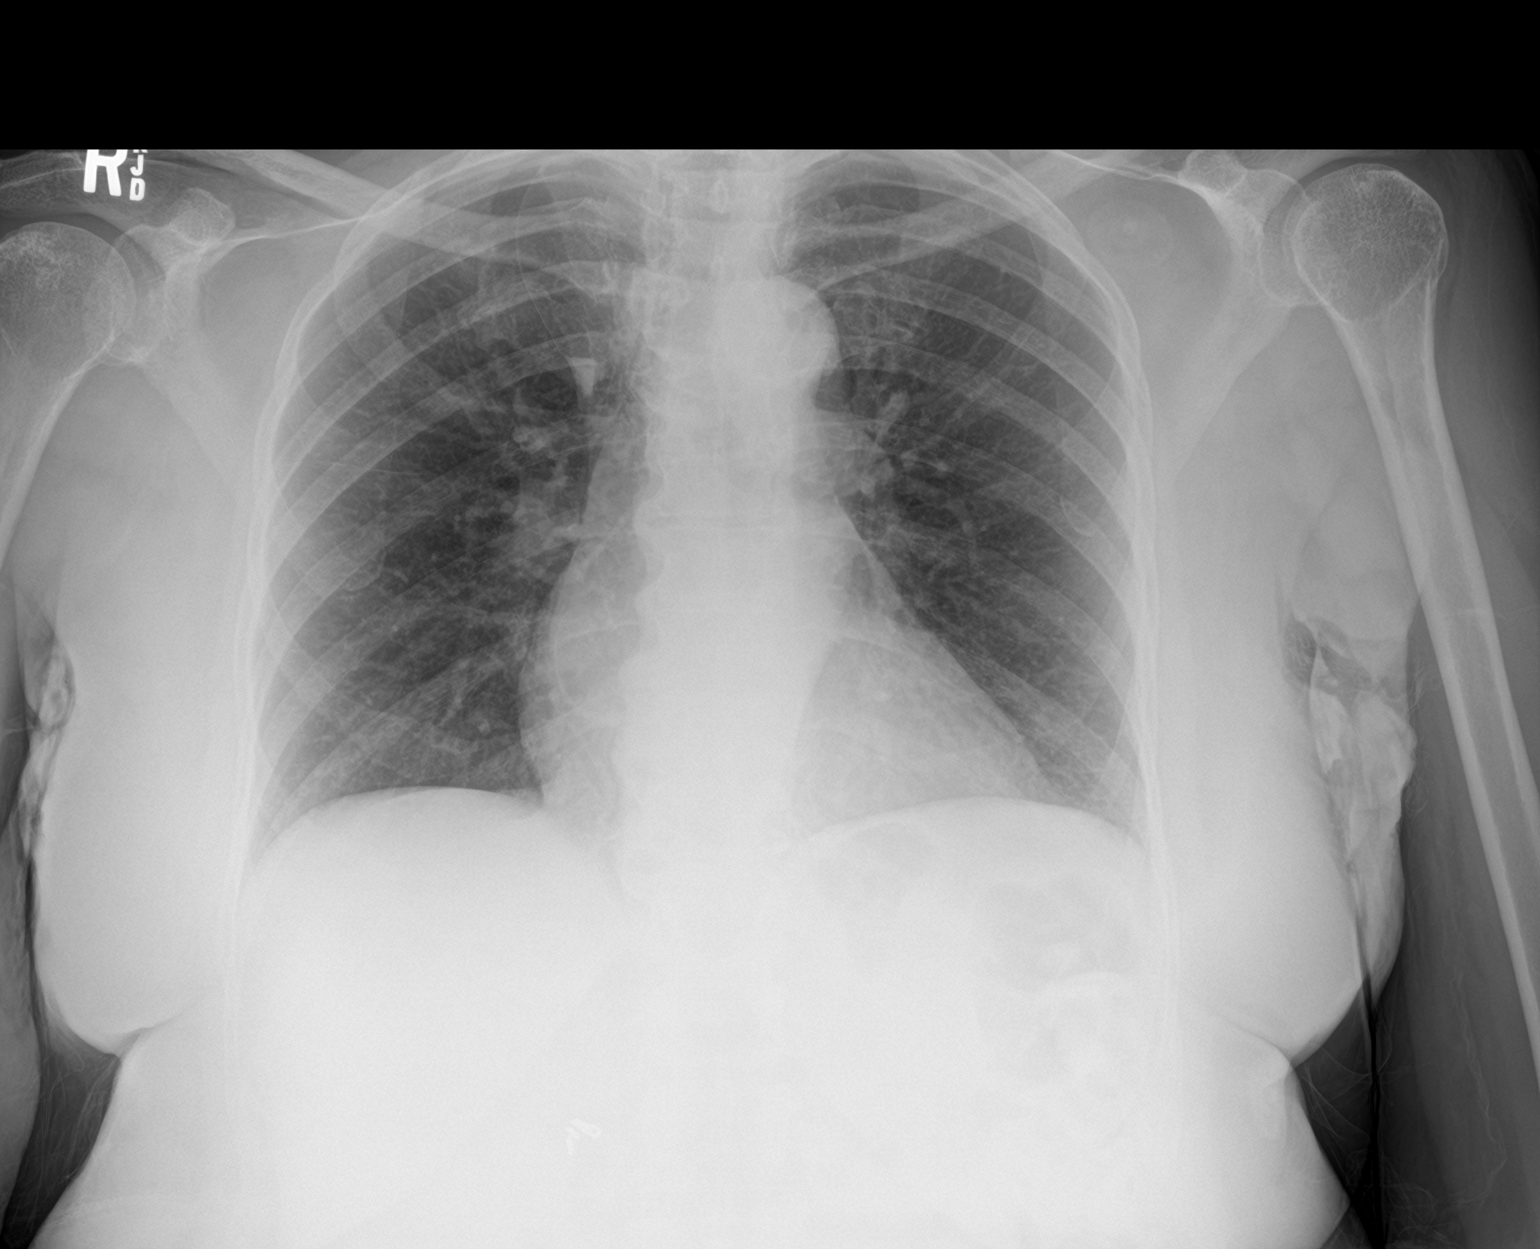

[2 of 2 positions shown; findings below may reference images not displayed]

FINDINGS: Upright AP and lateral views of the chest. Stable lung volumes.
Stable mild cardiomegaly. Other mediastinal contours are within
normal limits. Azygos fissure (normal variant). No pneumothorax,
pulmonary edema, pleural effusion or confluent pulmonary opacity.
Stable visualized osseous structures. Stable cholecystectomy clips.
Negative visible bowel gas pattern.
IMPRESSION: No acute cardiopulmonary abnormality.

## 2018-11-16 ENCOUNTER — Encounter: Payer: Medicare Other | Admitting: Physical Therapy

## 2018-11-18 ENCOUNTER — Encounter: Payer: Medicare Other | Admitting: Physical Therapy

## 2018-11-30 ENCOUNTER — Ambulatory Visit: Payer: Medicare Other

## 2018-12-01 ENCOUNTER — Ambulatory Visit
Admission: RE | Admit: 2018-12-01 | Discharge: 2018-12-01 | Disposition: A | Discharge status: Home / Self Care | Payer: Medicare Other | Source: Ambulatory Visit | Attending: Emergency Medicine | Admitting: Emergency Medicine

## 2018-12-01 DIAGNOSIS — Z1231 Encounter for screening mammogram for malignant neoplasm of breast: Secondary | ICD-10-CM

## 2018-12-07 DIAGNOSIS — H401131 Primary open-angle glaucoma, bilateral, mild stage: Secondary | ICD-10-CM | POA: Diagnosis not present

## 2018-12-07 DIAGNOSIS — E119 Type 2 diabetes mellitus without complications: Secondary | ICD-10-CM | POA: Diagnosis not present

## 2018-12-07 DIAGNOSIS — H2513 Age-related nuclear cataract, bilateral: Secondary | ICD-10-CM | POA: Diagnosis not present

## 2018-12-07 LAB — HM DIABETES EYE EXAM

## 2018-12-26 IMAGING — US US THYROID
1 series · 13 of 25 positions shown · non-contrast
Comparison: None.

CLINICAL DATA: Incidental on CT.  Right thyroid nodule noted on CT.

EXAM:
THYROID ULTRASOUND
TECHNIQUE: Ultrasound examination of the thyroid gland and adjacent soft
tissues was performed.

[Series 1: us thyroid · 0.07mm/px · 13 of 111 slices shown]
[im 1/111]
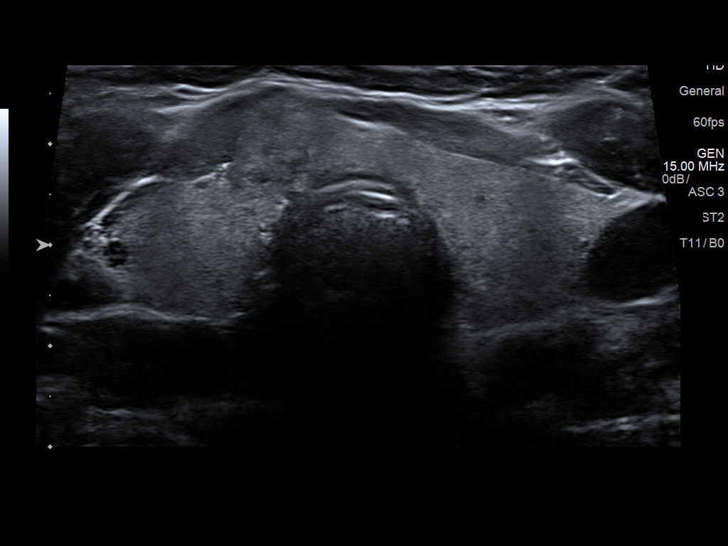
[im 10/111]
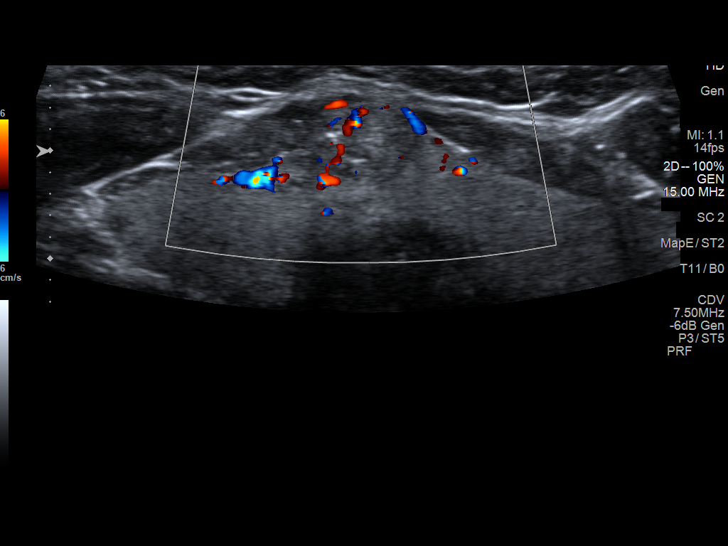
[im 19/111]
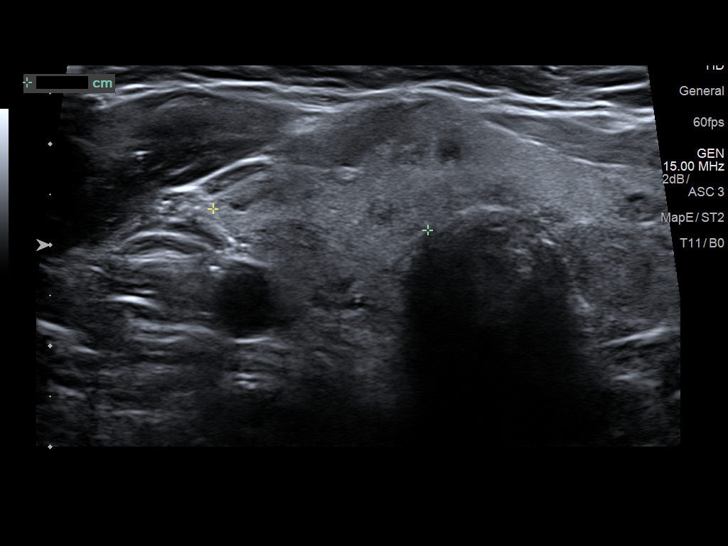
[im 28/111]
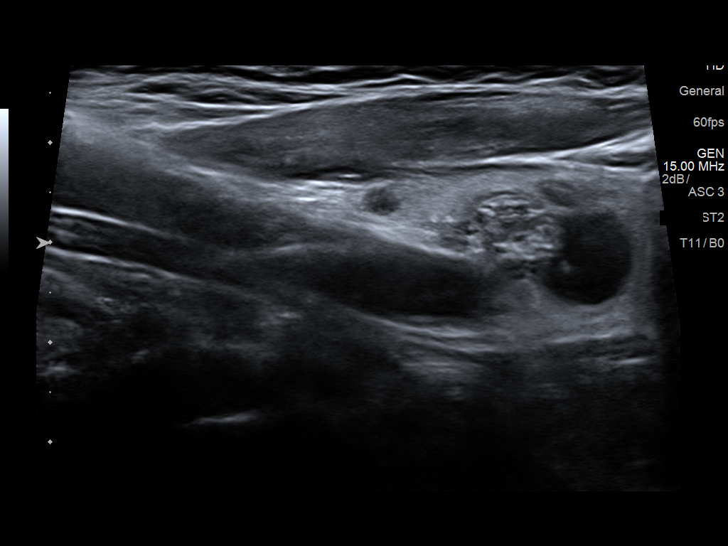
[im 37/111]
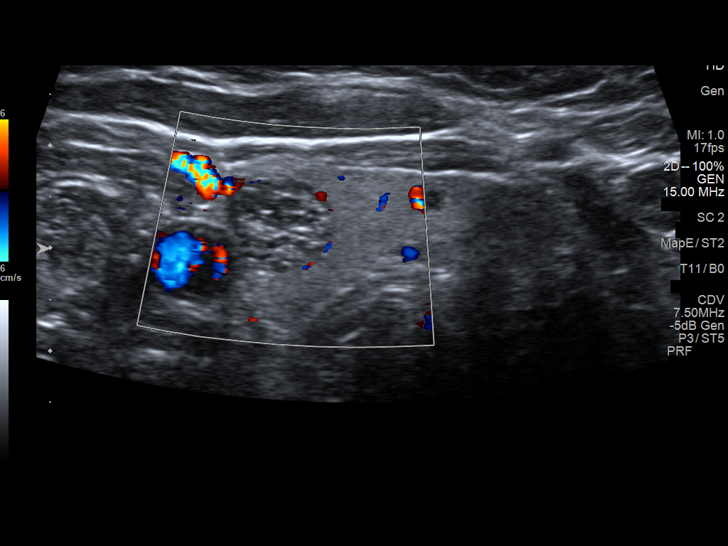
[im 46/111]
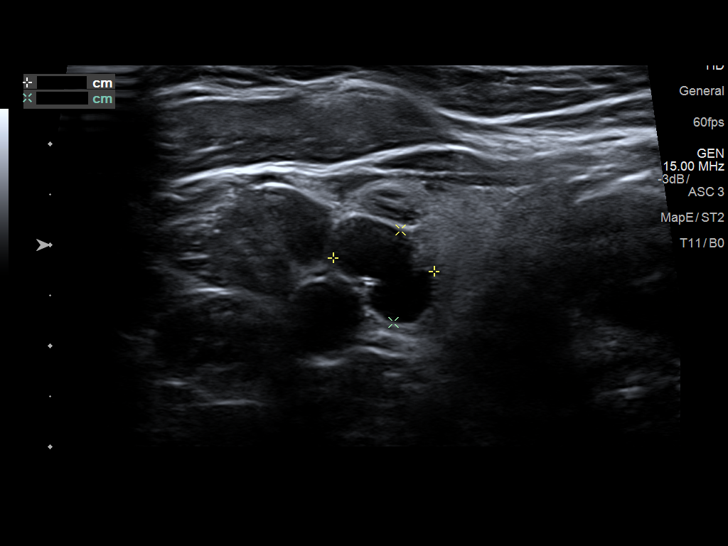
[im 56/111]
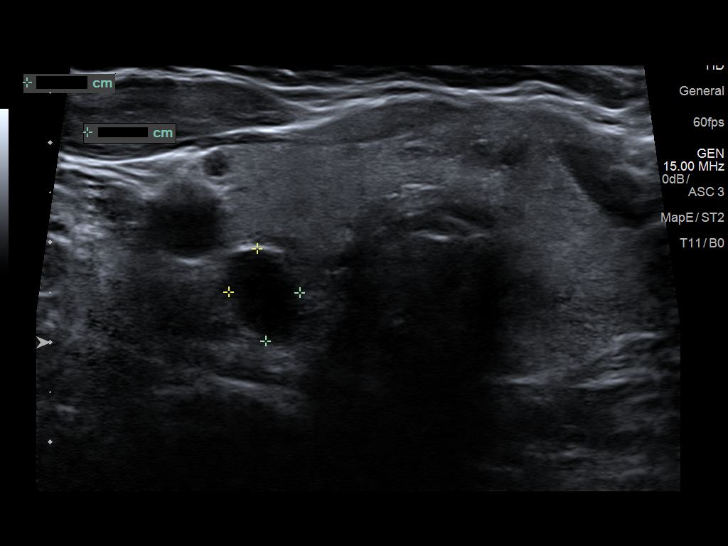
[im 65/111]
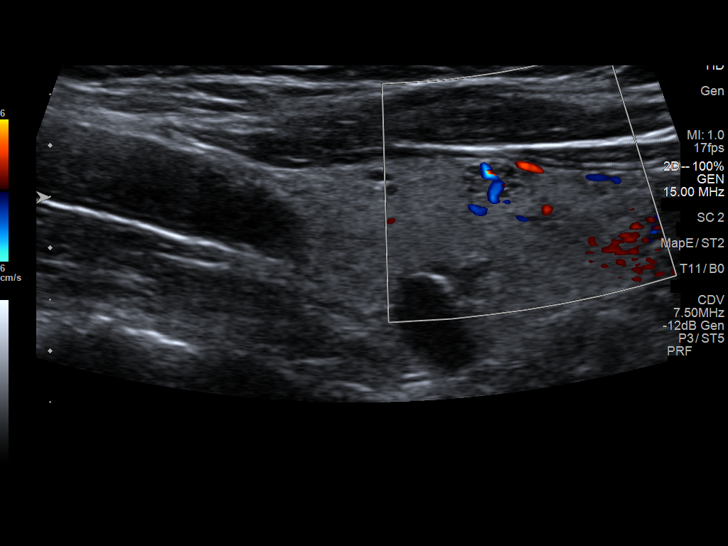
[im 74/111]
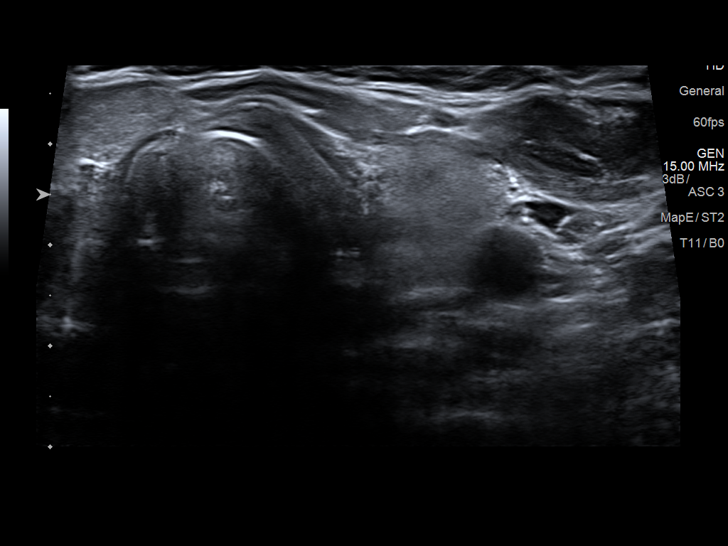
[im 83/111]
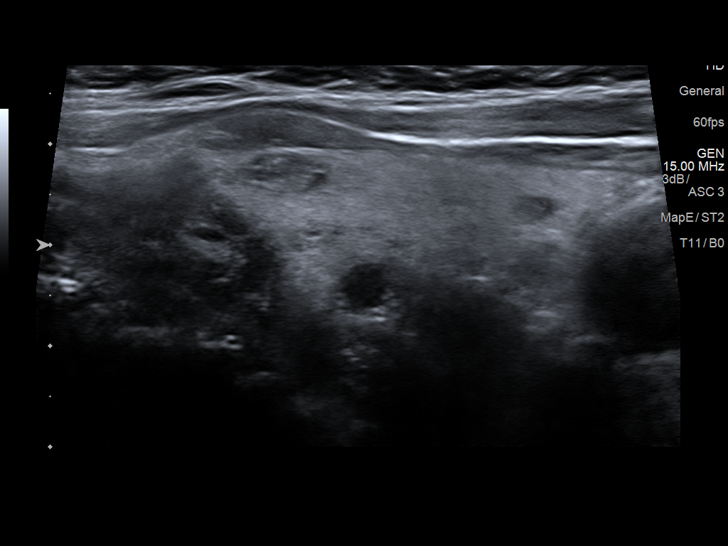
[im 92/111]
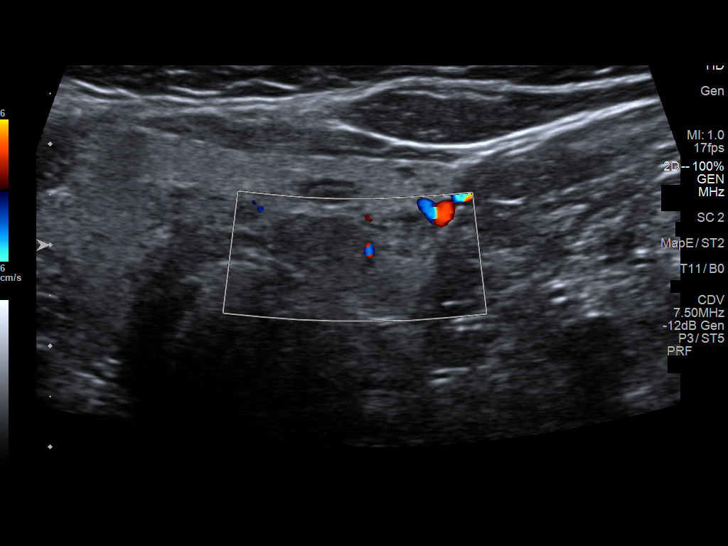
[im 101/111]
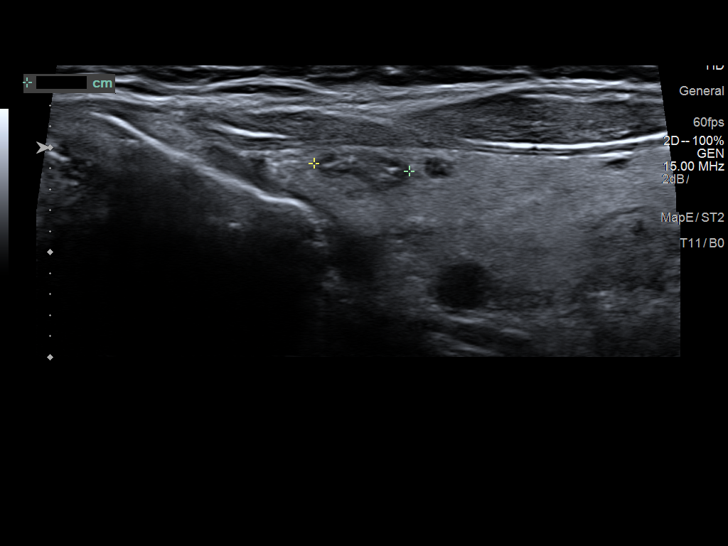
[im 111/111]
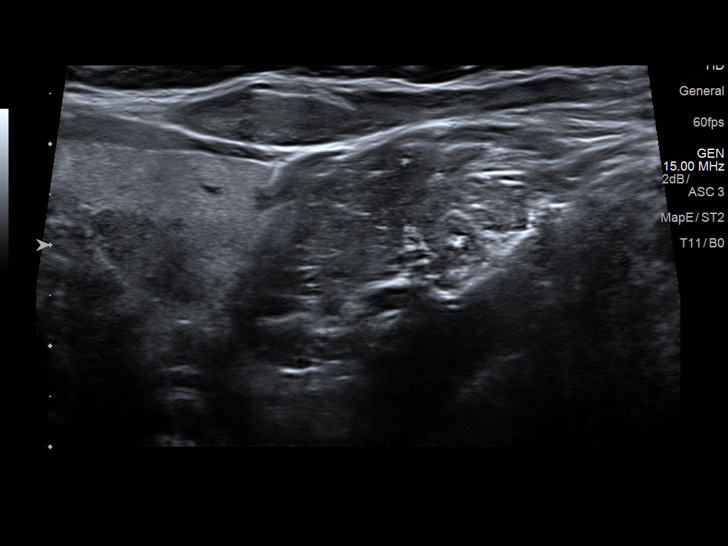

[13 of 25 positions shown; findings below may reference images not displayed]

FINDINGS: Parenchymal Echotexture: Mildly heterogenous

Isthmus: 0.7 cm

Right lobe: 4.8 x 2.2 x 2.1 cm

Left lobe: 4.3 x 1.8 x 1.9 cm

_________________________________________________________

Estimated total number of nodules >/= 1 cm: 5

Number of spongiform nodules >/=  2 cm not described below (TR1): 0

Number of mixed cystic and solid nodules >/= 1.5 cm not described
below (TR2): 0

_________________________________________________________

Nodule # 1:

Location: Isthmus; Mid

Maximum size: 1.8 cm; Other 2 dimensions: 1.2 x 0.8 cm

Composition: solid/almost completely solid (2)

Echogenicity: isoechoic (1)

Shape: not taller-than-wide (0)

Margins: smooth (0)

Echogenic foci: punctate echogenic foci (3)

ACR TI-RADS total points: 6.

ACR TI-RADS risk category: TR4 (4-6 points).

ACR TI-RADS recommendations:

**Given size (>/= 1.5 cm) and appearance, fine needle aspiration of
this moderately suspicious nodule should be considered based on
TI-RADS criteria.

_________________________________________________________

Nodule # 2:

Location: Right; [place]

Maximum size: 1.5 cm; Other 2 dimensions: 1.2 x 1.0 cm

Composition: solid/almost completely solid (2)

Echogenicity: hypoechoic (2)

Shape: not taller-than-wide (0)

Margins: smooth (0)

Echogenic foci: none (0)

ACR TI-RADS total points: 4.

ACR TI-RADS risk category: TR4 (4-6 points).

ACR TI-RADS recommendations:

**Given size (>/= 1.5 cm) and appearance, fine needle aspiration of
this moderately suspicious nodule should be considered based on
TI-RADS criteria.

_________________________________________________________

Nodule # 7:

Location: Left; Inferior

Maximum size: 1.2 cm; Other 2 dimensions: 1.1 x 0.8 cm

Composition: solid/almost completely solid (2)

Echogenicity: hypoechoic (2)

Shape: not taller-than-wide (0)

Margins: smooth (0)

Echogenic foci: none (0)

ACR TI-RADS total points: 4.

ACR TI-RADS risk category: TR4 (4-6 points).

ACR TI-RADS recommendations:

*Given size (>/= 1 - 1.4 cm) and appearance, a follow-up ultrasound
in 1 year should be considered based on TI-RADS criteria.

_________________________________________________________

There are small nodules measuring 0.9 cm or less which do not meet
criteria for biopsy nor follow-up. There are also small cysts
measuring 1.1 cm or less bilaterally which do not meet criteria for
biopsy nor follow-up.
IMPRESSION: Nodule 1 meets criteria for fine needle aspiration biopsy.

Nodule 2 meets criteria for fine needle aspiration biopsy.

Nodule 7 meets criteria for annual follow-up.

The above is in keeping with the ACR TI-RADS recommendations - [HOSPITAL] 1615;[DATE].

## 2019-01-04 ENCOUNTER — Other Ambulatory Visit: Payer: Self-pay | Admitting: Cardiology

## 2019-01-04 DIAGNOSIS — E785 Hyperlipidemia, unspecified: Secondary | ICD-10-CM

## 2019-01-05 NOTE — Telephone Encounter (Signed)
Rx(s) sent to pharmacy electronically.  

## 2019-01-06 ENCOUNTER — Other Ambulatory Visit: Payer: Self-pay | Admitting: Family Medicine

## 2019-01-06 ENCOUNTER — Encounter: Payer: Self-pay | Admitting: Family Medicine

## 2019-01-11 ENCOUNTER — Encounter: Payer: Self-pay | Admitting: Family Medicine

## 2019-01-11 ENCOUNTER — Other Ambulatory Visit: Payer: Self-pay

## 2019-01-11 ENCOUNTER — Ambulatory Visit (INDEPENDENT_AMBULATORY_CARE_PROVIDER_SITE_OTHER): Payer: Medicare Other | Admitting: Family Medicine

## 2019-01-11 VITALS — BP 141/76 | HR 57 | Temp 98.2°F | Wt 161.0 lb

## 2019-01-11 DIAGNOSIS — E1159 Type 2 diabetes mellitus with other circulatory complications: Secondary | ICD-10-CM

## 2019-01-11 DIAGNOSIS — E785 Hyperlipidemia, unspecified: Secondary | ICD-10-CM

## 2019-01-11 DIAGNOSIS — I10 Essential (primary) hypertension: Secondary | ICD-10-CM | POA: Diagnosis not present

## 2019-01-11 DIAGNOSIS — K219 Gastro-esophageal reflux disease without esophagitis: Secondary | ICD-10-CM | POA: Diagnosis not present

## 2019-01-11 DIAGNOSIS — Z86010 Personal history of colonic polyps: Secondary | ICD-10-CM | POA: Diagnosis not present

## 2019-01-11 LAB — POCT GLYCOSYLATED HEMOGLOBIN (HGB A1C): Hemoglobin A1C: 5.9 % — AB (ref 4.0–5.6)

## 2019-01-11 NOTE — Assessment & Plan Note (Signed)
Well-controlled, A1c 5.9 today.  Not on any controlling medications since May 2019.  Discussed with patient that she did not need to regularly check her glucose given that it is well controlled on diet alone, however it is reasonable for her to continue to check once a month/symptomatic per patient preference. Up-to-date on diabetic foot exam and ophthalmology evaluation.  - Obtain BMP today to evaluate renal function, CR 1.1 in 02/2018

## 2019-01-11 NOTE — Progress Notes (Signed)
   Subjective:    Patient ID: Heidi Wolf, female    DOB: [birth year], 78 y.o.   MRN: [id number]   CC: Diabetes follow-up  HPI: Heidi Wolf is a 78 year old female presenting to discuss the following:  Diabetes: A1c 5.9 in 08/2018.  She has not been on any controlling medication since May 2019, previously taking glipizide.  She has been monitoring her fasting glucose weekly, glucose ranging 90- 110 max.  Balanced diet.  Continues to stay active with walking and daily exercises for her shoulder provided by PT.  Denies any episodes of dizziness, lightheadedness, jitteriness.   Hypertension: Endorses compliance to her benazepril, amlodipine, and Lopressor.  Takes her amlodipine and benazepril in the morning.  Does not take her blood pressure at home.  As discussion from previous visit, patient has no longer to taking Protonix on a scheduled basis.  Has used 1-2 times PRN since 10/2018.   Smoking status reviewed  Review of Systems Per HPI, also denies recent illness, fever, headache, changes in vision, chest pain, shortness of breath, abdominal pain, N/V/D, weakness   Patient Active Problem List   Diagnosis Date Noted  . Sprain of left rotator cuff capsule 05/27/2018  . Thyroid nodule 04/09/2018  . Myocardial infarction ([place])   . GERD (gastroesophageal reflux disease)   . External hemorrhoid   . Cataracts, both eyes   . CAD (coronary artery disease)   . Arthritis   . Anemia   . Allergy   . Essential hypertension 09/03/2016  . Dyslipidemia 09/03/2016  . 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 Palpitations 09/03/2016  . CAD- S/P PCI July 2017 07/02/2016  . 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 Personal history of colonic polyps 03/03/2012  . Internal hemorrhoids without mention of complication 37/85/8850     Objective:  BP (!) 141/76   Pulse (!) 57   Temp 98.2 F (36.8 C) (Oral)   Wt 161 lb (73 kg)   SpO2 97%    BMI 27.64 kg/m  Vitals and nursing note reviewed  General: NAD, pleasant Cardiac: RRR, normal heart sounds, 2/6 systolic murmur Respiratory: CTAB, normal effort Abdomen: soft, nontender, nondistended Extremities: no edema or cyanosis. WWP. Skin: warm and dry, no rashes noted Neuro: alert and oriented, no focal deficits Psych: normal affect  Assessment & Plan:    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HCC) Well-controlled, A1c 5.9 today.  Not on any controlling medications since May 2019.  Discussed with patient that she did not need to regularly check her glucose given that it is well controlled on diet alone, however it is reasonable for her to continue to check once a month/symptomatic per patient preference. Up-to-date on diabetic foot exam and ophthalmology evaluation.  - Obtain BMP today to evaluate renal function, CR 1.1 in 02/2018  Essential hypertension Appropriate control, 141/76. Goal for patient <140/90.  Recommended transitioning her amlodipine and benazepril to take at night (if would easily become a part of her routine) to assist in reducing overall cardiovascular mortality and morbidity.  -Continue current hypertensive regimen  Dyslipidemia At goal.  LDL 40 in 08/2017.  Will continue atorvastatin 80 for secondary prevention.  Follow-up in 6 months for diabetes and hypertension follow-up, sooner if needed.  Darrelyn Hillock, DO Family Medicine Resident PGY-1

## 2019-01-11 NOTE — Assessment & Plan Note (Addendum)
At goal.  LDL 40 in 08/2017.  Will continue atorvastatin 80 for secondary prevention.

## 2019-01-11 NOTE — Patient Instructions (Signed)
It was a wonderful seeing you today!  You may now check your sugars only when symptomatic, however it is reasonable that you could continue to check once a month.  We checked your kidney function today, I will send a letter or call you with the results in 1-2 weeks.  I am so sorry for your family loss, I will continue to have your family in my thoughts.  Please follow-up in about 6 months to monitor your diabetes and high blood pressure.  Feel free to call the office if you have any questions or concerns.

## 2019-01-11 NOTE — Assessment & Plan Note (Signed)
Appropriate control, 141/76. Goal for patient <140/90.  Recommended transitioning her amlodipine and benazepril to take at night (if would easily become a part of her routine) to assist in reducing overall cardiovascular mortality and morbidity.  -Continue current hypertensive regimen

## 2019-01-12 LAB — BASIC METABOLIC PANEL
BUN/Creatinine Ratio: 15 (ref 12–28)
BUN: 16 mg/dL (ref 8–27)
CO2: 25 mmol/L (ref 20–29)
Calcium: 9.9 mg/dL (ref 8.7–10.3)
Chloride: 101 mmol/L (ref 96–106)
Creatinine, Ser: 1.07 mg/dL — ABNORMAL HIGH (ref 0.57–1.00)
GFR calc Af Amer: 58 mL/min/{1.73_m2} — ABNORMAL LOW (ref >59)
GFR calc non Af Amer: 50 mL/min/{1.73_m2} — ABNORMAL LOW (ref >59)
Glucose: 93 mg/dL (ref 65–99)
Potassium: 4.1 mmol/L (ref 3.5–5.2)
Sodium: 141 mmol/L (ref 134–144)

## 2019-01-13 ENCOUNTER — Encounter: Payer: Self-pay | Admitting: Family Medicine

## 2019-02-22 NOTE — Progress Notes (Signed)
Cardiology Office Note   Date:  02/24/2019   ID:  [name], [name] [birth year], MRN [id number]  PCP:  Heidi Clan, DO  Cardiologist:   Minus Breeding, MD   Chief Complaint  Patient presents with  . Shoulder Pain      History of Present Illness: Heidi Wolf is a 78 y.o. female who presents for follow up of CAD s/p Dx PCI/ DES 07/03/16. Re look 07/15/16 showed patent stent. She had no other significant CAD and LV wall motion was normal at cath. She complained of intermittent palpitations. A Holter monitor showed PACs and her beta blocker was increased.   She was in the ED in 9/18.  We saw her in consult and she was sent home after treatment of non anginal chest pain.   Since I last saw her she has done okay.  She has had left shoulder discomfort.  However, she has been told that this was from cervical problems and from arthritis.  It is somewhat sore to move.  She is done some physical therapy.  She is not describing anginal chest pain.  She does say that the pain in her shoulder causes palpitations.  We increased her beta-blocker slightly previously but she still gets these.  She describes isolated skipped beats but not any presyncope or syncope.  She is not having any new shortness of breath, PND or orthopnea.   Past Medical History:  Diagnosis Date  . Anemia   . Anxiety   . Arthritis    "right hip" (09/21/2014)  . CAD (coronary artery disease)    PCI 90% diagonal stenosis witha DES July 2017.  Nonobstructive disease elsewhere.   . Cataracts, both eyes   . Colon polyps    Hyperplastic 2003  . External hemorrhoid   . Fatigue 05/21/2017  . GERD (gastroesophageal reflux disease)   . Glaucoma   . Hyperlipidemia   . Hypertension   . Internal hemorrhoid   . Personal history of colonic polyps 03/03/2012  . Type II diabetes mellitus ([place])    type 2    Past Surgical History:  Procedure Laterality Date  . ABDOMINAL HYSTERECTOMY    . APPENDECTOMY     "w/gallbladder"  . CARDIAC CATHETERIZATION  X 2  . CARDIAC CATHETERIZATION N/A 07/03/2016   Procedure: Left Heart Cath and Coronary Angiography;  Surgeon: Dixie Dials, MD;  Location: [facility];  Service: Cardiovascular;  Laterality: N/A;  . CARDIAC CATHETERIZATION N/A 07/03/2016   Procedure: Coronary Stent Intervention;  Surgeon: Jettie Booze, MD;  Location: [facility];  Service: Cardiovascular;  Laterality: N/A;  . CARDIAC CATHETERIZATION N/A 07/15/2016   Procedure: Left Heart Cath and Coronary Angiography;  Surgeon: Dixie Dials, MD;  Location: [facility];  Service: Cardiovascular;  Laterality: N/A;  . CHOLECYSTECTOMY    . COLONOSCOPY    . CORONARY ANGIOPLASTY WITH STENT PLACEMENT  06/2016  . TUBAL LIGATION  1971     Current Outpatient Medications  Medication Sig Dispense Refill  . ACCU-CHEK SOFTCLIX LANCETS lancets Daily as needed. 100 each 3  . acetaminophen (TYLENOL) 325 MG tablet Take 2 tablets (650 mg total) by mouth 3 (three) times daily as needed for mild pain or moderate pain.    Marland Kitchen amLODipine (NORVASC) 10 MG tablet TAKE 1 TABLET(10 MG) BY MOUTH DAILY 90 tablet 2  . aspirin EC 81 MG tablet Take 1 tablet (81 mg total) by mouth daily.    Marland Kitchen atorvastatin (LIPITOR)  80 MG tablet Take 1 tablet (80 mg total) by mouth every evening. 90 tablet 2  . benazepril (LOTENSIN) 20 MG tablet TAKE 1 TABLET(20 MG) BY MOUTH DAILY 90 tablet 3  . cholecalciferol (VITAMIN D) 1000 UNITS tablet Take 1,000 Units by mouth every other day.     Marland Kitchen glucose blood (ACCU-CHEK AVIVA PLUS) test strip Check blood sugar up to once daily as needed. 100 each 3  . Lancets (ACCU-CHEK SOFT TOUCH) lancets Check up to once daily as needed. 100 each 3  . loratadine (CLARITIN) 10 MG tablet Take 10 mg by mouth daily as needed for allergies.     Marland Kitchen LUMIGAN 0.01 % SOLN Place 1 drop into both eyes at bedtime.     . metoprolol tartrate (LOPRESSOR) 25 MG tablet Take 1/2 tablets(12.5mg ) in the morning and 1  tablet(25 mg) at bedtime 135 tablet 3  . nitroGLYCERIN (NITROSTAT) 0.4 MG SL tablet Place 1 tablet (0.4 mg total) under the tongue every 5 (five) minutes as needed for chest pain. 25 tablet 1  . polyethylene glycol powder (GLYCOLAX/MIRALAX) powder Use 1 tsp daily as needed for constipation. Mix into liquid. 500 g 0  . potassium chloride SA (K-DUR,KLOR-CON) 20 MEQ tablet Take 1 tablet (20 mEq total) by mouth daily. 90 tablet 3  . trolamine salicylate (ASPERCREME) 10 % cream Apply 1 application topically as needed for muscle pain.    . pantoprazole (PROTONIX) 40 MG tablet Take 1 tablet by mouth daily.     No current facility-administered medications for this visit.     Allergies:   Aspirin   ROS:  Please see the history of present illness.   Otherwise, review of systems are positive none.   All other systems are reviewed and negative.    PHYSICAL EXAM: VS:  BP 136/68   Pulse (!) 50   Ht 5\' 4"  (1.626 m)   Wt 165 lb 12.8 oz (75.2 kg)   BMI 28.46 kg/m  , BMI Body mass index is 28.46 kg/m.  GENERAL:  Well appearing NECK:  No jugular venous distention, waveform within normal limits, carotid upstroke brisk and symmetric, no bruits, no thyromegaly LUNGS:  Clear to auscultation bilaterally CHEST:  Unremarkable HEART:  PMI not displaced or sustained,S1 and S2 within normal limits, no S3, no S4, no clicks, no rubs, soft apical systolic murmur early peaking radiating slightly to the axilla, no diastolic murmurs ABD:  Flat, positive bowel sounds normal in frequency in pitch, no bruits, no rebound, no guarding, no midline pulsatile mass, no hepatomegaly, no splenomegaly EXT:  2 plus pulses throughout, no edema, no cyanosis no clubbing   EKG:  EKG is ordered today. EKG demonstrates sinus bradycardia, rate 50, left ventricular hypertrophy by voltage criteria with repolarization changes and no change from previous other than the presence of PACs.  Recent Labs: 03/26/2018: Hemoglobin 12.9; Platelets  256 04/08/2018: TSH 1.120 01/11/2019: BUN 16; Creatinine, Ser 1.07; Potassium 4.1; Sodium 141    Lipid Panel    Component Value Date/Time   CHOL 96 (L) 09/18/2017 1016   TRIG 76 09/18/2017 1016   HDL 41 09/18/2017 1016   CHOLHDL 2.3 09/18/2017 1016   CHOLHDL 2.6 07/15/2016 0111   VLDL 20 07/15/2016 0111   LDLCALC 40 09/18/2017 1016     Lab Results  Component Value Date   HGBA1C 5.9 (A) 01/11/2019    Wt Readings from Last 3 Encounters:  02/24/19 165 lb 12.8 oz (75.2 kg)  01/11/19 161 lb (73 kg)  11/09/18 167 lb (75.8 kg)      Other studies Reviewed: Additional studies/ records that were reviewed today include: Labs Review of the above records demonstrates:  See below   ASSESSMENT AND PLAN:  CAD:  The patient has no new sypmtoms.  No further cardiovascular testing is indicated.  We will continue with aggressive risk reduction and meds as listed.  She has atypical pain and clear joint pain that is not angina.  No change in therapy.   PALPITATIONS:   We discussed these today.  She is had PACs.  She would not tolerate higher dose medications that might slow her heart rate.  These are benign and I do not suspect a more significant dysrhythmia as the pattern is stable.  No change in therapy.  She is comfortable managing this conservatively.  HTN:   The blood pressure is at target with an increase in her amlodipine recently.  No change in therapy.   DM: A1c is low and she is actually been able to stop some of her medicines.  Management per Heidi Clan, DO   HYPERLIPIDEMIA:     LDL was not checked since 2018 and I will order lipid and liver.      Current medicines are reviewed at length with the patient today.  The patient does not have concerns regarding medicines.  The following changes have been made:   None  Labs/ tests ordered today include:  None  Orders Placed This Encounter  Procedures  . Lipid panel  . Hepatic function panel  . EKG 12-Lead      Disposition:   FU with me in 12 months.     Signed, Minus Breeding, MD  02/24/2019 10:19 AM    [facility]

## 2019-02-24 ENCOUNTER — Encounter: Payer: Self-pay | Admitting: Cardiology

## 2019-02-24 ENCOUNTER — Ambulatory Visit (INDEPENDENT_AMBULATORY_CARE_PROVIDER_SITE_OTHER): Payer: Medicare Other | Admitting: Cardiology

## 2019-02-24 VITALS — BP 136/68 | HR 50 | Ht 64.0 in | Wt 165.8 lb

## 2019-02-24 DIAGNOSIS — E785 Hyperlipidemia, unspecified: Secondary | ICD-10-CM | POA: Diagnosis not present

## 2019-02-24 DIAGNOSIS — R002 Palpitations: Secondary | ICD-10-CM

## 2019-02-24 DIAGNOSIS — Z9861 Coronary angioplasty status: Secondary | ICD-10-CM

## 2019-02-24 DIAGNOSIS — I2510 Atherosclerotic heart disease of native coronary artery without angina pectoris: Secondary | ICD-10-CM | POA: Diagnosis not present

## 2019-02-24 DIAGNOSIS — Z79899 Other long term (current) drug therapy: Secondary | ICD-10-CM

## 2019-02-24 LAB — HEPATIC FUNCTION PANEL
ALT: 18 IU/L (ref 0–32)
AST: 23 IU/L (ref 0–40)
Albumin: 4.7 g/dL (ref 3.7–4.7)
Alkaline Phosphatase: 78 IU/L (ref 39–117)
Bilirubin Total: 0.4 mg/dL (ref 0.0–1.2)
Bilirubin, Direct: 0.09 mg/dL (ref 0.00–0.40)
Total Protein: 7.5 g/dL (ref 6.0–8.5)

## 2019-02-24 LAB — LIPID PANEL
Chol/HDL Ratio: 3.2 ratio (ref 0.0–4.4)
Cholesterol, Total: 138 mg/dL (ref 100–199)
HDL: 43 mg/dL (ref >39)
LDL Calculated: 66 mg/dL (ref 0–99)
Triglycerides: 145 mg/dL (ref 0–149)
VLDL Cholesterol Cal: 29 mg/dL (ref 5–40)

## 2019-02-24 MED ORDER — NITROGLYCERIN 0.4 MG SL SUBL: 0.4000 mg | SUBLINGUAL_TABLET | SUBLINGUAL | 1 refills | Status: DC | PRN

## 2019-02-24 NOTE — Patient Instructions (Signed)
Medication Instructions:  Continue current medications  If you need a refill on your cardiac medications before your next appointment, please call your pharmacy.  Labwork: Fasting Lipid and Liver HERE IN OUR OFFICE AT [facility]  You will need to fast. DO NOT EAT OR DRINK PAST MIDNIGHT.       Take the provided lab slips with you to the lab for your blood draw.   When you have your labs (blood work) drawn today and your tests are completely normal, you will receive your results only by MyChart Message (if you have MyChart) -OR-  A paper copy in the mail.  If you have any lab test that is abnormal or we need to change your treatment, we will call you to review these results.  Testing/Procedures: None Ordered  Follow-Up: You will need a follow up appointment in 1 Year.  Please call our office 2 months in advance to schedule this appointment.  You may see Minus Breeding, MD or one of the following Advanced Practice Providers on your designated Care Team:   Rosaria Ferries, PA-C . Jory Sims, DNP, ANP  At [facility], you and your health needs are our priority.  As part of our continuing mission to provide you with exceptional heart care, we have created designated Provider Care Teams.  These Care Teams include your primary Cardiologist (physician) and Advanced Practice Providers (APPs -  Physician Assistants and Nurse Practitioners) who all work together to provide you with the care you need, when you need it.  Thank you for choosing [facility] at [facility]!!

## 2019-03-08 DIAGNOSIS — H401131 Primary open-angle glaucoma, bilateral, mild stage: Secondary | ICD-10-CM | POA: Diagnosis not present

## 2019-05-03 ENCOUNTER — Other Ambulatory Visit: Payer: Self-pay

## 2019-05-03 MED ORDER — GLUCOSE BLOOD VI STRP: ORAL_STRIP | 3 refills | Status: DC

## 2019-06-10 ENCOUNTER — Other Ambulatory Visit: Payer: Self-pay | Admitting: Cardiology

## 2019-06-14 DIAGNOSIS — H401131 Primary open-angle glaucoma, bilateral, mild stage: Secondary | ICD-10-CM | POA: Diagnosis not present

## 2019-06-14 DIAGNOSIS — H524 Presbyopia: Secondary | ICD-10-CM | POA: Diagnosis not present

## 2019-06-14 DIAGNOSIS — H2513 Age-related nuclear cataract, bilateral: Secondary | ICD-10-CM | POA: Diagnosis not present

## 2019-06-30 ENCOUNTER — Ambulatory Visit (INDEPENDENT_AMBULATORY_CARE_PROVIDER_SITE_OTHER): Payer: Medicare Other | Admitting: Family Medicine

## 2019-06-30 ENCOUNTER — Other Ambulatory Visit: Payer: Self-pay

## 2019-06-30 ENCOUNTER — Encounter: Payer: Self-pay | Admitting: Family Medicine

## 2019-06-30 VITALS — BP 140/65 | HR 55 | Temp 98.7°F | Wt 160.0 lb

## 2019-06-30 DIAGNOSIS — Z9861 Coronary angioplasty status: Secondary | ICD-10-CM | POA: Diagnosis not present

## 2019-06-30 DIAGNOSIS — E1159 Type 2 diabetes mellitus with other circulatory complications: Secondary | ICD-10-CM

## 2019-06-30 DIAGNOSIS — I10 Essential (primary) hypertension: Secondary | ICD-10-CM

## 2019-06-30 DIAGNOSIS — I2510 Atherosclerotic heart disease of native coronary artery without angina pectoris: Secondary | ICD-10-CM

## 2019-06-30 DIAGNOSIS — M5090 Cervical disc disorder, unspecified, unspecified cervical region: Secondary | ICD-10-CM | POA: Diagnosis not present

## 2019-06-30 LAB — POCT GLYCOSYLATED HEMOGLOBIN (HGB A1C): HbA1c, POC (prediabetic range): 6.1 % (ref 5.7–6.4)

## 2019-06-30 NOTE — Patient Instructions (Signed)
As always, wonderful to see you today.  Start doing your physical therapy exercises for your shoulder once or twice a day.  You may use your Tylenol 325 for breakfast, lunch, dinner.  You may also try the lidocaine or Voltaren gel over-the-counter to place on your shoulder as needed for pain relief.  If you start experiencing any numbness, weakness, chest pains, shortness of breath, facial droop, dizziness/lightheadedness, please seek care immediately.  See how your shoulder is doing in the next few weeks, if it is not improving or worsening, please make sure you follow-up.  We could consider injections/referral at that time.  Otherwise, I will see you back in approximately 3 months for your chronic management.

## 2019-06-30 NOTE — Progress Notes (Signed)
Subjective:    Patient ID: Heidi Wolf, female    DOB: [birth year], 78 y.o.   MRN: [id number]   CC: Shoulder pain/T2DM   HPI: Ms Shampine is a 78 year old female presenting to discuss the following:   T2DM: Currently diet controlled.  Last A1c 5.9 in 12/2018.  She continues to have a well-balanced diet.  Still monitors her glucose every couple of days, on average 90-120.  Hypertension: Endorses compliance to her Norvasc 10 mg, benazepril 20 mg, and Lopressor twice daily.  Left shoulder pain: Has been intermittently present since February 2020.  She has a history of the identical pain last year, felt to be cervicalgia with rotator cuff involvement.  She completed physical therapy with great success and improvement of this pain for several months until recurrence in February.  Once restarting it actually got better for a while, however spiked back up again in May.  Notes it is worse when she raises her arm overhead, however is noticed it randomly other times as well.  Whenever she "takes it easier "on that arm it seems to do much better. Doesn't sleep on her left side to help with the pain. This has not impacted her ability to do ADLs and activities she enjoys to do.  She describes the pain as sore and tingling sensation starting from her mid posterior neck that will occasionally go down her left arm into her fingers.  She has been using Tylenol 3 25 mg twice a day and blue emu cream.  Tylenol has been effective, blue emu cream not working as much.  She denies any associated weakness, numbness, chest pain, shortness of breath, facial drooping, dizziness/lightheadedness.   She still has exercises that she learned through physical therapy last year, she has been doing in approximately 1-twice a week.   Review of Systems Per HPI, also denies recent illness, fever, headache, changes in vision, chest pain, shortness of breath, abdominal pain, N/V/D, weakness   Patient Active Problem List   Diagnosis Date Noted  . Medication management 02/24/2019  . Sprain of left rotator cuff capsule 05/27/2018  . Thyroid nodule 04/09/2018  . Myocardial infarction ([place])   . GERD (gastroesophageal reflux disease)   . External hemorrhoid   . Cataracts, both eyes   . CAD (coronary artery disease)   . Arthritis   . Allergy   . Essential hypertension 09/03/2016  . Dyslipidemia 09/03/2016  . 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 Palpitations 09/03/2016  . CAD- S/P PCI July 2017 07/02/2016  . 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 Personal history of colonic polyps 03/03/2012  . Internal hemorrhoids without mention of complication 42/59/5638     Objective:  BP 140/65   Pulse (!) 55   Temp 98.7 F (37.1 C) (Oral)   Wt 72.6 kg   SpO2 99%   BMI 27.46 kg/m  Vitals and nursing note reviewed  General: NAD, pleasant, older female Cardiac: RRR, radial pulses intact bilaterally, no tenderness to anterior chest Respiratory: CTAB, normal effort Abdomen: soft, nontender, nondistended dry Extremities: Right and left shoulders/cervical spine with no obvious deformities, fracture, skin changes.  Bilateral shoulder joints nontender to palpation throughout.  No muscular atrophy noted.  Bilateral upper extremity with full ROM and 5/5 muscle strength.  Sensation intact to light touch to bilateral upper extremity.  Positive Hawkins test on the left due to sensation of pain within shoulder joint.  Spurling's positive on the  left.  Tense bilateral trapezius muscles. 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  Appears worse with overhead movements.  Suspect combination of cervicalgia secondary to osteoarthritic changes and shoulder/rotator cuff involvement.  Could consider impingement syndrome, rotator  cuff tendinopathy, osteoarthritis of shoulder joint.  Preserved ROM, sensation, and strength.  Not impacting ADLs.  Previously had great success with physical therapy.  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  A1c 6.1.  We will continue to monitor closely.  Consider metformin on follow-up if A1c continues to trend upwards.  Essential hypertension Reasonable control. - Continue Norvasc 10 mg, benazepril 20 mg, and Lopressor twice daily  Follow-up in 1 month if symptoms not improving, or sooner if worsening.  Otherwise follow-up in 3 months for chronic conditions.  Darrelyn Hillock, DO Family Medicine Resident PGY-2

## 2019-07-02 ENCOUNTER — Encounter: Payer: Self-pay | Admitting: Family Medicine

## 2019-07-02 NOTE — Assessment & Plan Note (Signed)
Diet-controlled for the past year.  A1c 6.1.  We will continue to monitor closely.  Consider metformin on follow-up if A1c continues to trend upwards.

## 2019-07-02 NOTE — Assessment & Plan Note (Signed)
Reasonable control. - Continue Norvasc 10 mg, benazepril 20 mg, and Lopressor twice daily

## 2019-07-02 NOTE — Assessment & Plan Note (Addendum)
Acute on chronic.  Appears worse with overhead movements.  Suspect combination of cervicalgia secondary to osteoarthritic changes and shoulder/rotator cuff involvement.  Could consider impingement syndrome, rotator cuff tendinopathy, osteoarthritis of shoulder joint.  Preserved ROM, sensation, and strength.  Not impacting ADLs.  Previously had great success with physical therapy.  - Restart home physical therapy exercises 1-2 times daily - May use Tylenol PRN, every 4-6 hours for relief - May also continue to use blue emu cream, or trial OTC Voltaren/lidocaine gels - Avoid aggravating activities, however encouraged to stay active as possible - Return precautions discussed, including development of weakness, numbness etc - Could consider repeat imaging vs injection on follow-up if no improvement - Follow-up if symptoms not improving in the next month or sooner if worsening

## 2019-09-27 DIAGNOSIS — H401131 Primary open-angle glaucoma, bilateral, mild stage: Secondary | ICD-10-CM | POA: Diagnosis not present

## 2019-10-04 ENCOUNTER — Other Ambulatory Visit: Payer: Self-pay | Admitting: Cardiology

## 2019-10-05 ENCOUNTER — Other Ambulatory Visit: Payer: Self-pay

## 2019-10-05 ENCOUNTER — Ambulatory Visit (INDEPENDENT_AMBULATORY_CARE_PROVIDER_SITE_OTHER): Payer: Medicare Other | Admitting: Family Medicine

## 2019-10-05 ENCOUNTER — Encounter: Payer: Self-pay | Admitting: Family Medicine

## 2019-10-05 VITALS — BP 128/64 | HR 74 | Ht 64.0 in | Wt 159.4 lb

## 2019-10-05 DIAGNOSIS — I10 Essential (primary) hypertension: Secondary | ICD-10-CM | POA: Diagnosis not present

## 2019-10-05 DIAGNOSIS — Z23 Encounter for immunization: Secondary | ICD-10-CM | POA: Diagnosis not present

## 2019-10-05 DIAGNOSIS — I2510 Atherosclerotic heart disease of native coronary artery without angina pectoris: Secondary | ICD-10-CM

## 2019-10-05 DIAGNOSIS — Z9861 Coronary angioplasty status: Secondary | ICD-10-CM

## 2019-10-05 DIAGNOSIS — E785 Hyperlipidemia, unspecified: Secondary | ICD-10-CM

## 2019-10-05 DIAGNOSIS — M79641 Pain in right hand: Secondary | ICD-10-CM | POA: Diagnosis not present

## 2019-10-05 DIAGNOSIS — E1159 Type 2 diabetes mellitus with other circulatory complications: Secondary | ICD-10-CM | POA: Diagnosis not present

## 2019-10-05 LAB — POCT GLYCOSYLATED HEMOGLOBIN (HGB A1C): HbA1c, POC (controlled diabetic range): 6 % (ref 0.0–7.0)

## 2019-10-05 MED ORDER — AMLODIPINE BESYLATE 10 MG PO TABS: ORAL_TABLET | ORAL | 2 refills | Status: DC

## 2019-10-05 NOTE — Assessment & Plan Note (Signed)
Currently on atorvastatin for secondary prevention.  Will monitor lipid panel on follow-up.

## 2019-10-05 NOTE — Assessment & Plan Note (Addendum)
Well-controlled, continue current regimen.  Will check BMP on follow-up.

## 2019-10-05 NOTE — Progress Notes (Signed)
   Subjective:    Patient ID: Heidi Wolf, female    DOB: April 04, 1941, 78 y.o.   MRN: [id number]   CC: f/u DM, HTN   HPI: Heidi Wolf is a 78 year old female with hypertension, diet-controlled diabetes presenting discuss the following:  Diabetes: A1c 6 today in the office.  Diet controlled.  Checks her glucose every couple of days, fasting usually around 90-110 on average.  Hypertension: Takes amlodipine 10 mg, benazepril 20 mg, Lopressor 12.5 in the AM and 25 in p.m. Endorses compliance.  Hand pain: Right hand. About a week or so. Sore and stiff. Hit her hand when she was going to restroom in the middle of the night bc the lights were off, been this way since then. Has been using Tylenol 650mg  twice daily, which helps some.  She is also been soaking it in warm baths.  Denies feeling any weakness, paresthesias, numbness--however having trouble closing her hand and moving her wrist due to stiffness.  Previously had left shoulder pains, restarted her physical therapy exercises which she has had significant benefit with.   Smoking status reviewed  Review of Systems Per HPI   Objective:  BP 128/64   Pulse 74   Ht 5\' 4"  (1.626 m)   Wt 159 lb 6.4 oz (72.3 kg)   SpO2 98%   BMI 27.36 kg/m  Vitals and nursing note reviewed  General: NAD, pleasant Cardiac: RRR, normal heart sounds, stable 2/6 systolic murmur Respiratory: CTAB, normal effort Abdomen: soft, nontender, nondistended Extremities: no edema or cyanosis. WWP.  No deformity or swelling to right hand in comparison to left.  No bruising or point tenderness to palpation of right hand.  Limited ROM of wrist flexion/extension and unable to make a fist due to stiffness on the right.  Strength preserved in upper extremity bilaterally.  Sensation to light touch intact throughout upper extremity. Skin: warm and dry, no rashes noted Neuro: alert and oriented, no focal deficits Psych: normal affect  Assessment & Plan:   Hand  pain, right Acute hand soreness and stiffness in the setting of recently hitting her hand 1 week ago. Difficulty with wrist flexion/extension and making fist.  Suspect may be sore in the recent injury vs arthritis flare, however given advanced age and presentation, will obtain hand XR to rule out underlying fracture.  - Supportive care including Tylenol 650 mg every 4-6 hours, blue emu cream, ice/heat - Perform ROM exercises twice daily - Hand XR right placed, patient to walk-in to [facility] imaging center - Pending imaging, follow-up if not improving in the next 1-2 weeks or sooner if worsening  Essential hypertension Well-controlled, continue current regimen.  Will check BMP on follow-up.  Dyslipidemia Currently on atorvastatin for secondary prevention.  Will monitor lipid panel on follow-up.  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HCC) A1c 6.0, diet controlled.  Congratulated her on her continued efforts towards balanced lifestyle.  Encouraged her to continue to remain active as possible and choosing well-balanced foods.  Will recheck in 3-6 months.  She may continue to check glucose on PRN basis per her preference.   Received flu shot today.  Follow-up in approximately 3 months, or sooner if needed for above if not improving or worsening.  [place] Medicine Resident PGY-2

## 2019-10-05 NOTE — Assessment & Plan Note (Signed)
A1c 6.0, diet controlled.  Congratulated her on her continued efforts towards balanced lifestyle.  Encouraged her to continue to remain active as possible and choosing well-balanced foods.  Will recheck in 3-6 months.  She may continue to check glucose on PRN basis per her preference.

## 2019-10-05 NOTE — Patient Instructions (Addendum)
It was wonderful seeing you today as always.  You can continue checking your sugar as needed or if you have any symptoms of feeling lightheaded/dizzy, fatigue.  I will call [facility] later today to make sure that you get a refill of your strips.  I have also sent in the refill from your amlodipine.  For your hand pain--you can take Tylenol 650 mg every 4-6 hours (maximum daily dose is 4000 mg).  I would also recommend trying a blue emu cream and trying heat on your joints.  Additionally, work on your range of motion exercises to improve function in this hand.  If it is still feeling stiff or continue to have trouble with his hand, please give me a call in a week we can discuss getting an x-ray.

## 2019-10-05 NOTE — Assessment & Plan Note (Signed)
Acute hand soreness and stiffness in the setting of recently hitting her hand 1 week ago. Difficulty with wrist flexion/extension and making fist.  Suspect may be sore in the recent injury vs arthritis flare, however given advanced age and presentation, will obtain hand XR to rule out underlying fracture.  - Supportive care including Tylenol 650 mg every 4-6 hours, blue emu cream, ice/heat - Perform ROM exercises twice daily - Hand XR right placed, patient to walk-in to [facility] imaging center - Pending imaging, follow-up if not improving in the next 1-2 weeks or sooner if worsening

## 2019-10-06 ENCOUNTER — Ambulatory Visit
Admission: RE | Admit: 2019-10-06 | Discharge: 2019-10-06 | Disposition: A | Discharge status: Home / Self Care | Payer: Medicare Other | Source: Ambulatory Visit | Attending: Family Medicine | Admitting: Family Medicine

## 2019-10-06 DIAGNOSIS — M79641 Pain in right hand: Secondary | ICD-10-CM

## 2019-10-26 ENCOUNTER — Other Ambulatory Visit: Payer: Self-pay | Admitting: Family Medicine

## 2019-10-26 DIAGNOSIS — Z1231 Encounter for screening mammogram for malignant neoplasm of breast: Secondary | ICD-10-CM

## 2019-12-16 ENCOUNTER — Other Ambulatory Visit: Payer: Self-pay

## 2019-12-16 ENCOUNTER — Ambulatory Visit
Admission: RE | Admit: 2019-12-16 | Discharge: 2019-12-16 | Disposition: A | Discharge status: Home / Self Care | Payer: Medicare Other | Source: Ambulatory Visit | Attending: *Deleted | Admitting: *Deleted

## 2019-12-16 DIAGNOSIS — Z1231 Encounter for screening mammogram for malignant neoplasm of breast: Secondary | ICD-10-CM

## 2020-02-02 ENCOUNTER — Encounter: Payer: Self-pay | Admitting: Family Medicine

## 2020-02-02 NOTE — Progress Notes (Signed)
Subjective:    Patient ID: Heidi Wolf, female    DOB: [birth year], 79 y.o.   MRN: [id number]   CC: Checkup  HPI: Ms. Demus is a 79 year old female with CAD, hypertension, well-controlled T2DM, and GERD presenting discuss the following:  Hypertension: Currently takes Norvasc 10 mg, benazepril 20 mg, Lopressor 25 mg (half tablet in a.m. and 1 tablet in p.m.).  Endorses compliance.  Denies any chest pain, shortness of breath, lightheadedness/dizziness, palpitations.  Follows with Dr. Percival Spanish, cardiology.  Does not check her blood pressure at home.   Diabetes: Diet controlled, last A1c 6.0.   Ankle pain/swelling: States she noticed about a week ago she had a little bit of ankle pain similar to her arthritis elsewhere and a little bit of swelling around the joint.  Says her ankle joint has felt a little stiff.  She used blue emu cream in this area with relief.  States she will notice a little bit of swelling in her lower extremities later in the day, however always normal when she wakes up.  She is also been using Tylenol for her arthritis, wonders if she can take the "arthritis strength."  Health maintenance: Due for foot exam and ophthalmology exam.   Objective:  BP 140/60   Pulse 62   Wt 155 lb 3.2 oz (70.4 kg)   SpO2 98%   BMI 26.64 kg/m  Vitals and nursing note reviewed  General: NAD, pleasant Cardiac: RRR, normal heart sounds, 2/6 systolic murmur Respiratory: CTAB, normal effort Abdomen: soft, nontender, nondistended Extremities/MSK: Trace edema to the BLE midshin with minimal edema surrounding left ankle joint.  No deformity, bruising noted to left ankle.  Nontender to palpation of left foot.  Full ROM of bilateral ankle joints.  5/5 strength with ankle dorsiflexion/plantar flexion, foot supination/pronation.  Negative talar tilt. Skin: warm and dry, no rashes noted Psych: normal affect  Diabetic Foot Exam - Simple   Simple Foot Form Diabetic Foot exam was performed  with the following findings: Yes 02/03/2020  1:18 PM  Visual Inspection No deformities, no ulcerations, no other skin breakdown bilaterally: Yes Sensation Testing Intact to touch and monofilament testing bilaterally: Yes Pulse Check Posterior Tibialis and Dorsalis pulse intact bilaterally: Yes Comments     Assessment & Plan:   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HCC) A1c 5.9.  Has been diet controlled for over a year now.  Encouraged her to continue staying active and following a well-balanced diet.  Will recheck in 6 months, may spread out evaluation after that given A1c under diabetic range for several years.  Essential hypertension SBP 140 in the clinic today.  Continue Lopressor, Norvasc, Benzapril. Consider this reasonable control given her comorbidities and age, however will defer to her cardiologist if desires tighter control due to history of CAD.  Will obtain BMP to monitor renal function today.  Ankle pain Mild, associated with trace edema around ankle joint.  Suspect arthropathy given similar in pain to her arthritis elsewhere.  Reassuring exam without any evidence of ligamentous laxity or tendinous injury.  Considered gout, however given mild in severity and without history of this, less concerned.  Doubt fracture without any history of trauma.  Pain improving with blue emu cream, will continue this with ice and elevating when possible.  Dyslipidemia On Lipitor 80 for secondary prevention.  Will recheck lipid panel, LDL goal <70.    Follow-up in 6 months or sooner if needed.  [facility] Resident PGY-2

## 2020-02-03 ENCOUNTER — Ambulatory Visit (INDEPENDENT_AMBULATORY_CARE_PROVIDER_SITE_OTHER): Payer: Medicare Other | Admitting: Family Medicine

## 2020-02-03 ENCOUNTER — Encounter: Payer: Self-pay | Admitting: Family Medicine

## 2020-02-03 ENCOUNTER — Other Ambulatory Visit: Payer: Self-pay

## 2020-02-03 VITALS — BP 140/60 | HR 62 | Wt 155.2 lb

## 2020-02-03 DIAGNOSIS — E1159 Type 2 diabetes mellitus with other circulatory complications: Secondary | ICD-10-CM | POA: Diagnosis not present

## 2020-02-03 DIAGNOSIS — E785 Hyperlipidemia, unspecified: Secondary | ICD-10-CM

## 2020-02-03 DIAGNOSIS — M25572 Pain in left ankle and joints of left foot: Secondary | ICD-10-CM | POA: Diagnosis not present

## 2020-02-03 DIAGNOSIS — I10 Essential (primary) hypertension: Secondary | ICD-10-CM | POA: Diagnosis not present

## 2020-02-03 LAB — POCT GLYCOSYLATED HEMOGLOBIN (HGB A1C): HbA1c, POC (controlled diabetic range): 5.9 % (ref 0.0–7.0)

## 2020-02-03 NOTE — Patient Instructions (Signed)
Wonderful to see you today as always.   For your arthritis--you can continue to use Tylenol.  Please limit up to 3 g (3000 mg) total a day.  You can also continue to use the blue emu cream or Aspercreme on your shoulder/ankle.   If your ankle pain becomes more severe, your swelling increases, or you develop any chest pain/shortness of breath with rest/activity/laying down please make sure you follow-up in our clinic or with your cardiologist.  We are checking your kidney function and your cholesterol today, I will let you know these results in the next few days.  Please make sure you follow-up with your cardiologist at your appointment scheduled later this month.

## 2020-02-04 ENCOUNTER — Encounter: Payer: Self-pay | Admitting: Family Medicine

## 2020-02-04 DIAGNOSIS — M25579 Pain in unspecified ankle and joints of unspecified foot: Secondary | ICD-10-CM | POA: Insufficient documentation

## 2020-02-04 HISTORY — DX: Pain in unspecified ankle and joints of unspecified foot: M25.579

## 2020-02-04 LAB — COMPREHENSIVE METABOLIC PANEL
ALT: 9 IU/L (ref 0–32)
AST: 13 IU/L (ref 0–40)
Albumin/Globulin Ratio: 1.3 (ref 1.2–2.2)
Albumin: 4.3 g/dL (ref 3.7–4.7)
Alkaline Phosphatase: 73 IU/L (ref 39–117)
BUN/Creatinine Ratio: 18 (ref 12–28)
BUN: 17 mg/dL (ref 8–27)
Bilirubin Total: 0.3 mg/dL (ref 0.0–1.2)
CO2: 20 mmol/L (ref 20–29)
Calcium: 9.6 mg/dL (ref 8.7–10.3)
Chloride: 103 mmol/L (ref 96–106)
Creatinine, Ser: 0.92 mg/dL (ref 0.57–1.00)
GFR calc Af Amer: 69 mL/min/{1.73_m2} (ref >59)
GFR calc non Af Amer: 60 mL/min/{1.73_m2} (ref >59)
Globulin, Total: 3.2 g/dL (ref 1.5–4.5)
Glucose: 91 mg/dL (ref 65–99)
Potassium: 3.9 mmol/L (ref 3.5–5.2)
Sodium: 140 mmol/L (ref 134–144)
Total Protein: 7.5 g/dL (ref 6.0–8.5)

## 2020-02-04 LAB — LIPID PANEL
Chol/HDL Ratio: 4.4 ratio (ref 0.0–4.4)
Cholesterol, Total: 183 mg/dL (ref 100–199)
HDL: 42 mg/dL (ref >39)
LDL Chol Calc (NIH): 120 mg/dL — ABNORMAL HIGH (ref 0–99)
Triglycerides: 113 mg/dL (ref 0–149)
VLDL Cholesterol Cal: 21 mg/dL (ref 5–40)

## 2020-02-04 NOTE — Assessment & Plan Note (Signed)
SBP 140 in the clinic today.  Continue Lopressor, Norvasc, Benzapril. Consider this reasonable control given her comorbidities and age, however will defer to her cardiologist if desires tighter control due to history of CAD.  Will obtain BMP to monitor renal function today.

## 2020-02-04 NOTE — Assessment & Plan Note (Addendum)
A1c 5.9.  Has been diet controlled for over a year now.  Encouraged her to continue staying active and following a well-balanced diet.  Will recheck in 6 months, may spread out evaluation after that given A1c under diabetic range for several years.

## 2020-02-04 NOTE — Assessment & Plan Note (Signed)
On Lipitor 80 for secondary prevention.  Will recheck lipid panel, LDL goal <70.

## 2020-02-04 NOTE — Assessment & Plan Note (Signed)
Mild, associated with trace edema around ankle joint.  Suspect arthropathy given similar in pain to her arthritis elsewhere.  Reassuring exam without any evidence of ligamentous laxity or tendinous injury.  Considered gout, however given mild in severity and without history of this, less concerned.  Doubt fracture without any history of trauma.  Pain improving with blue emu cream, will continue this with ice and elevating when possible.

## 2020-02-06 ENCOUNTER — Emergency Department (HOSPITAL_COMMUNITY)
Admission: EM | Admit: 2020-02-06 | Discharge: 2020-02-06 | Disposition: A | Discharge status: Home / Self Care | Payer: No Typology Code available for payment source | Attending: Emergency Medicine | Admitting: Emergency Medicine

## 2020-02-06 ENCOUNTER — Other Ambulatory Visit: Payer: Self-pay

## 2020-02-06 ENCOUNTER — Encounter (HOSPITAL_COMMUNITY): Payer: Self-pay | Admitting: Emergency Medicine

## 2020-02-06 ENCOUNTER — Emergency Department (HOSPITAL_COMMUNITY): Payer: No Typology Code available for payment source

## 2020-02-06 DIAGNOSIS — M542 Cervicalgia: Secondary | ICD-10-CM | POA: Diagnosis not present

## 2020-02-06 DIAGNOSIS — S199XXA Unspecified injury of neck, initial encounter: Secondary | ICD-10-CM | POA: Diagnosis not present

## 2020-02-06 DIAGNOSIS — R519 Headache, unspecified: Secondary | ICD-10-CM | POA: Diagnosis not present

## 2020-02-06 DIAGNOSIS — H524 Presbyopia: Secondary | ICD-10-CM | POA: Diagnosis not present

## 2020-02-06 DIAGNOSIS — Z7982 Long term (current) use of aspirin: Secondary | ICD-10-CM | POA: Diagnosis not present

## 2020-02-06 DIAGNOSIS — R0789 Other chest pain: Secondary | ICD-10-CM | POA: Diagnosis not present

## 2020-02-06 DIAGNOSIS — Y92410 Unspecified street and highway as the place of occurrence of the external cause: Secondary | ICD-10-CM | POA: Insufficient documentation

## 2020-02-06 DIAGNOSIS — S161XXA Strain of muscle, fascia and tendon at neck level, initial encounter: Secondary | ICD-10-CM | POA: Diagnosis not present

## 2020-02-06 DIAGNOSIS — I2510 Atherosclerotic heart disease of native coronary artery without angina pectoris: Secondary | ICD-10-CM | POA: Diagnosis not present

## 2020-02-06 DIAGNOSIS — M25512 Pain in left shoulder: Secondary | ICD-10-CM | POA: Diagnosis not present

## 2020-02-06 DIAGNOSIS — Z955 Presence of coronary angioplasty implant and graft: Secondary | ICD-10-CM | POA: Diagnosis not present

## 2020-02-06 DIAGNOSIS — Y939 Activity, unspecified: Secondary | ICD-10-CM | POA: Insufficient documentation

## 2020-02-06 DIAGNOSIS — E119 Type 2 diabetes mellitus without complications: Secondary | ICD-10-CM | POA: Diagnosis not present

## 2020-02-06 DIAGNOSIS — E1136 Type 2 diabetes mellitus with diabetic cataract: Secondary | ICD-10-CM | POA: Diagnosis not present

## 2020-02-06 DIAGNOSIS — Z79899 Other long term (current) drug therapy: Secondary | ICD-10-CM | POA: Diagnosis not present

## 2020-02-06 DIAGNOSIS — S4992XA Unspecified injury of left shoulder and upper arm, initial encounter: Secondary | ICD-10-CM | POA: Diagnosis not present

## 2020-02-06 DIAGNOSIS — Y999 Unspecified external cause status: Secondary | ICD-10-CM | POA: Diagnosis not present

## 2020-02-06 DIAGNOSIS — I10 Essential (primary) hypertension: Secondary | ICD-10-CM | POA: Insufficient documentation

## 2020-02-06 DIAGNOSIS — H25813 Combined forms of age-related cataract, bilateral: Secondary | ICD-10-CM | POA: Diagnosis not present

## 2020-02-06 DIAGNOSIS — R079 Chest pain, unspecified: Secondary | ICD-10-CM | POA: Diagnosis not present

## 2020-02-06 DIAGNOSIS — S0990XA Unspecified injury of head, initial encounter: Secondary | ICD-10-CM | POA: Diagnosis not present

## 2020-02-06 DIAGNOSIS — H5203 Hypermetropia, bilateral: Secondary | ICD-10-CM | POA: Diagnosis not present

## 2020-02-06 DIAGNOSIS — S299XXA Unspecified injury of thorax, initial encounter: Secondary | ICD-10-CM | POA: Diagnosis not present

## 2020-02-06 DIAGNOSIS — H401131 Primary open-angle glaucoma, bilateral, mild stage: Secondary | ICD-10-CM | POA: Diagnosis not present

## 2020-02-06 MED ORDER — ACETAMINOPHEN 325 MG PO TABS: 650.0000 mg | ORAL_TABLET | Freq: Once | ORAL | Status: DC

## 2020-02-06 NOTE — ED Notes (Signed)
Patient verbalizes understanding of discharge instructions. Opportunity for questioning and answers were provided. Armband removed by staff, pt discharged from ED ambulatory to home.  

## 2020-02-06 NOTE — ED Notes (Signed)
Walked patient to the bathroom patient did well patient is now back in the room getting her clothes on with call bell in reach
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
## 2020-02-06 NOTE — ED Notes (Signed)
Got patient undress on the monitor did ekg shown to Dr Plunkett patient is resting with call bell in reach 
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
## 2020-02-07 ENCOUNTER — Other Ambulatory Visit: Payer: Self-pay | Admitting: Family Medicine

## 2020-02-07 ENCOUNTER — Telehealth: Payer: Self-pay | Admitting: Family Medicine
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
## 2020-02-08 DIAGNOSIS — H04123 Dry eye syndrome of bilateral lacrimal glands: Secondary | ICD-10-CM | POA: Diagnosis not present

## 2020-02-09 ENCOUNTER — Ambulatory Visit
Admission: RE | Admit: 2020-02-09 | Discharge: 2020-02-09 | Disposition: A | Discharge status: Home / Self Care | Payer: Medicare Other | Source: Ambulatory Visit | Attending: Family Medicine | Admitting: Family Medicine

## 2020-02-09 DIAGNOSIS — S59901A Unspecified injury of right elbow, initial encounter: Secondary | ICD-10-CM | POA: Diagnosis not present

## 2020-02-09 DIAGNOSIS — M25511 Pain in right shoulder: Secondary | ICD-10-CM | POA: Diagnosis not present

## 2020-02-13 ENCOUNTER — Telehealth: Payer: Self-pay | Admitting: *Deleted

## 2020-02-13 NOTE — Telephone Encounter (Signed)
Pt informed. Still has some swelling told her to continue to do what dr told her and to call back if it doesn't revolve. Mykell Rawl Kennon Holter, CMA

## 2020-02-13 NOTE — Telephone Encounter (Signed)
-----   Message from [name], DO sent at 02/10/2020  2:37 PM EST ----- Please call Ms. Mulnix and let her know that her shoulder/elbow Xrays were both normal-- not showing any fractures. Thank you!

## 2020-02-14 ENCOUNTER — Emergency Department (HOSPITAL_COMMUNITY): Payer: Medicare Other

## 2020-02-14 ENCOUNTER — Encounter (HOSPITAL_COMMUNITY): Payer: Self-pay | Admitting: Emergency Medicine

## 2020-02-14 ENCOUNTER — Observation Stay (HOSPITAL_COMMUNITY)
Admission: EM | Admit: 2020-02-14 | Discharge: 2020-02-15 | Disposition: A | Discharge status: Home / Self Care | Payer: Medicare Other | Attending: Cardiovascular Disease | Admitting: Cardiovascular Disease

## 2020-02-14 ENCOUNTER — Observation Stay (HOSPITAL_BASED_OUTPATIENT_CLINIC_OR_DEPARTMENT_OTHER): Payer: Medicare Other

## 2020-02-14 ENCOUNTER — Other Ambulatory Visit: Payer: Self-pay

## 2020-02-14 ENCOUNTER — Telehealth: Payer: Self-pay | Admitting: Cardiology

## 2020-02-14 DIAGNOSIS — R079 Chest pain, unspecified: Secondary | ICD-10-CM | POA: Diagnosis not present

## 2020-02-14 DIAGNOSIS — Z20822 Contact with and (suspected) exposure to covid-19: Secondary | ICD-10-CM | POA: Diagnosis not present

## 2020-02-14 DIAGNOSIS — I2510 Atherosclerotic heart disease of native coronary artery without angina pectoris: Secondary | ICD-10-CM | POA: Insufficient documentation

## 2020-02-14 DIAGNOSIS — Z7982 Long term (current) use of aspirin: Secondary | ICD-10-CM | POA: Insufficient documentation

## 2020-02-14 DIAGNOSIS — E785 Hyperlipidemia, unspecified: Secondary | ICD-10-CM | POA: Insufficient documentation

## 2020-02-14 DIAGNOSIS — I361 Nonrheumatic tricuspid (valve) insufficiency: Secondary | ICD-10-CM

## 2020-02-14 DIAGNOSIS — Z8249 Family history of ischemic heart disease and other diseases of the circulatory system: Secondary | ICD-10-CM | POA: Insufficient documentation

## 2020-02-14 DIAGNOSIS — Z955 Presence of coronary angioplasty implant and graft: Secondary | ICD-10-CM | POA: Diagnosis not present

## 2020-02-14 DIAGNOSIS — E119 Type 2 diabetes mellitus without complications: Secondary | ICD-10-CM | POA: Diagnosis not present

## 2020-02-14 DIAGNOSIS — K219 Gastro-esophageal reflux disease without esophagitis: Secondary | ICD-10-CM | POA: Diagnosis not present

## 2020-02-14 DIAGNOSIS — E7800 Pure hypercholesterolemia, unspecified: Secondary | ICD-10-CM | POA: Insufficient documentation

## 2020-02-14 DIAGNOSIS — Z79899 Other long term (current) drug therapy: Secondary | ICD-10-CM | POA: Diagnosis not present

## 2020-02-14 DIAGNOSIS — I081 Rheumatic disorders of both mitral and tricuspid valves: Secondary | ICD-10-CM | POA: Insufficient documentation

## 2020-02-14 DIAGNOSIS — I252 Old myocardial infarction: Secondary | ICD-10-CM | POA: Diagnosis not present

## 2020-02-14 DIAGNOSIS — R072 Precordial pain: Secondary | ICD-10-CM | POA: Diagnosis not present

## 2020-02-14 DIAGNOSIS — I10 Essential (primary) hypertension: Secondary | ICD-10-CM | POA: Diagnosis not present

## 2020-02-14 DIAGNOSIS — R0789 Other chest pain: Secondary | ICD-10-CM | POA: Diagnosis not present

## 2020-02-14 DIAGNOSIS — I340 Nonrheumatic mitral (valve) insufficiency: Secondary | ICD-10-CM

## 2020-02-14 LAB — BASIC METABOLIC PANEL
Anion gap: 13 (ref 5–15)
BUN: 11 mg/dL (ref 8–23)
CO2: 21 mmol/L — ABNORMAL LOW (ref 22–32)
Calcium: 9.3 mg/dL (ref 8.9–10.3)
Chloride: 106 mmol/L (ref 98–111)
Creatinine, Ser: 0.92 mg/dL (ref 0.44–1.00)
GFR calc Af Amer: >60 mL/min (ref >60)
GFR calc non Af Amer: 60 mL/min — ABNORMAL LOW (ref >60)
Glucose, Bld: 111 mg/dL — ABNORMAL HIGH (ref 70–99)
Potassium: 3.5 mmol/L (ref 3.5–5.1)
Sodium: 140 mmol/L (ref 135–145)

## 2020-02-14 LAB — TSH: TSH: 0.89 u[IU]/mL (ref 0.350–4.500)

## 2020-02-14 LAB — ECHOCARDIOGRAM COMPLETE

## 2020-02-14 LAB — CBC WITH DIFFERENTIAL/PLATELET
Abs Immature Granulocytes: 0.02 10*3/uL (ref 0.00–0.07)
Basophils Absolute: 0 10*3/uL (ref 0.0–0.1)
Basophils Relative: 0 %
Eosinophils Absolute: 0 10*3/uL (ref 0.0–0.5)
Eosinophils Relative: 0 %
HCT: 39.9 % (ref 36.0–46.0)
Hemoglobin: 12.7 g/dL (ref 12.0–15.0)
Immature Granulocytes: 0 %
Lymphocytes Relative: 24 %
Lymphs Abs: 2.3 10*3/uL (ref 0.7–4.0)
MCH: 26.7 pg (ref 26.0–34.0)
MCHC: 31.8 g/dL (ref 30.0–36.0)
MCV: 84 fL (ref 80.0–100.0)
Monocytes Absolute: 0.5 10*3/uL (ref 0.1–1.0)
Monocytes Relative: 5 %
Neutro Abs: 6.5 10*3/uL (ref 1.7–7.7)
Neutrophils Relative %: 71 %
Platelets: 341 10*3/uL (ref 150–400)
RBC: 4.75 MIL/uL (ref 3.87–5.11)
RDW: 14.1 % (ref 11.5–15.5)
WBC: 9.4 10*3/uL (ref 4.0–10.5)
nRBC: 0 % (ref 0.0–0.2)

## 2020-02-14 LAB — MAGNESIUM: Magnesium: 1.9 mg/dL (ref 1.7–2.4)

## 2020-02-14 LAB — HEMOGLOBIN A1C
Hgb A1c MFr Bld: 5.9 % — ABNORMAL HIGH (ref 4.8–5.6)
Mean Plasma Glucose: 122.63 mg/dL

## 2020-02-14 LAB — GLUCOSE, CAPILLARY
Glucose-Capillary: 100 mg/dL — ABNORMAL HIGH (ref 70–99)
Glucose-Capillary: 99 mg/dL (ref 70–99)

## 2020-02-14 LAB — TROPONIN I (HIGH SENSITIVITY)
Troponin I (High Sensitivity): 11 ng/L (ref <18)
Troponin I (High Sensitivity): 13 ng/L (ref <18)

## 2020-02-14 LAB — SARS CORONAVIRUS 2 BY RT PCR (DIASORIN): SARS Coronavirus 2: NEGATIVE

## 2020-02-14 MED ORDER — ATORVASTATIN CALCIUM 80 MG PO TABS
80.0000 mg | ORAL_TABLET | Freq: Every evening | ORAL | Status: DC
  Administered 2020-02-14: 80 mg via ORAL
  Filled 2020-02-14: qty 1

## 2020-02-14 MED ORDER — PANTOPRAZOLE SODIUM 40 MG PO TBEC
40.0000 mg | DELAYED_RELEASE_TABLET | Freq: Every day | ORAL | Status: DC
  Administered 2020-02-14 – 2020-02-15 (×2): 40 mg via ORAL
  Filled 2020-02-14 (×2): qty 1

## 2020-02-14 MED ORDER — ONDANSETRON HCL 4 MG/2ML IJ SOLN: 4.0000 mg | Freq: Four times a day (QID) | INTRAMUSCULAR | Status: DC | PRN

## 2020-02-14 MED ORDER — ASPIRIN EC 81 MG PO TBEC
81.0000 mg | DELAYED_RELEASE_TABLET | Freq: Every day | ORAL | Status: DC
  Administered 2020-02-15: 81 mg via ORAL
  Filled 2020-02-14: qty 1

## 2020-02-14 MED ORDER — INSULIN ASPART 100 UNIT/ML ~~LOC~~ SOLN: 0.0000 [IU] | Freq: Three times a day (TID) | SUBCUTANEOUS | Status: DC

## 2020-02-14 MED ORDER — ASPIRIN EC 81 MG PO TBEC: 81.0000 mg | DELAYED_RELEASE_TABLET | Freq: Every day | ORAL | Status: DC

## 2020-02-14 MED ORDER — AMLODIPINE BESYLATE 10 MG PO TABS
10.0000 mg | ORAL_TABLET | Freq: Every day | ORAL | Status: DC
  Administered 2020-02-14 – 2020-02-15 (×2): 10 mg via ORAL
  Filled 2020-02-14: qty 2
  Filled 2020-02-14: qty 1

## 2020-02-14 MED ORDER — ACETAMINOPHEN 325 MG PO TABS
650.0000 mg | ORAL_TABLET | ORAL | Status: DC | PRN
  Administered 2020-02-14 – 2020-02-15 (×3): 650 mg via ORAL
  Filled 2020-02-14 (×2): qty 2

## 2020-02-14 MED ORDER — BIMATOPROST 0.01 % OP SOLN
1.0000 [drp] | Freq: Every day | OPHTHALMIC | Status: DC
  Administered 2020-02-14: 1 [drp] via OPHTHALMIC

## 2020-02-14 MED ORDER — SODIUM CHLORIDE 0.9% FLUSH
3.0000 mL | Freq: Two times a day (BID) | INTRAVENOUS | Status: DC
  Administered 2020-02-14 – 2020-02-15 (×3): 3 mL via INTRAVENOUS

## 2020-02-14 MED ORDER — HEPARIN SODIUM (PORCINE) 5000 UNIT/ML IJ SOLN
5000.0000 [IU] | Freq: Three times a day (TID) | INTRAMUSCULAR | Status: DC
  Administered 2020-02-14 – 2020-02-15 (×3): 5000 [IU] via SUBCUTANEOUS
  Filled 2020-02-14 (×3): qty 1

## 2020-02-14 MED ORDER — ACETAMINOPHEN 325 MG PO TABS
650.0000 mg | ORAL_TABLET | Freq: Three times a day (TID) | ORAL | Status: DC | PRN
  Filled 2020-02-14: qty 2

## 2020-02-14 MED ORDER — NITROGLYCERIN 0.4 MG SL SUBL: 0.4000 mg | SUBLINGUAL_TABLET | SUBLINGUAL | Status: DC | PRN

## 2020-02-14 MED ORDER — ZOLPIDEM TARTRATE 5 MG PO TABS: 5.0000 mg | ORAL_TABLET | Freq: Every evening | ORAL | Status: DC | PRN

## 2020-02-14 MED ORDER — INSULIN ASPART 100 UNIT/ML ~~LOC~~ SOLN: 0.0000 [IU] | Freq: Every day | SUBCUTANEOUS | Status: DC

## 2020-02-14 MED ORDER — METOPROLOL TARTRATE 12.5 MG HALF TABLET
12.5000 mg | ORAL_TABLET | Freq: Two times a day (BID) | ORAL | Status: DC
  Administered 2020-02-14 – 2020-02-15 (×3): 12.5 mg via ORAL
  Filled 2020-02-14 (×3): qty 1

## 2020-02-14 MED ORDER — SODIUM CHLORIDE 0.9% FLUSH: 3.0000 mL | INTRAVENOUS | Status: DC | PRN

## 2020-02-14 MED ORDER — BENAZEPRIL HCL 20 MG PO TABS
20.0000 mg | ORAL_TABLET | Freq: Every day | ORAL | Status: DC
  Administered 2020-02-15: 20 mg via ORAL
  Filled 2020-02-14 (×3): qty 1

## 2020-02-14 MED ORDER — SODIUM CHLORIDE 0.9 % IV SOLN: 250.0000 mL | INTRAVENOUS | Status: DC | PRN

## 2020-02-14 MED ORDER — ASPIRIN 81 MG PO CHEW
324.0000 mg | CHEWABLE_TABLET | Freq: Every day | ORAL | Status: DC
  Administered 2020-02-14: 324 mg via ORAL
  Filled 2020-02-14: qty 4

## 2020-02-14 MED ORDER — ALPRAZOLAM 0.25 MG PO TABS: 0.2500 mg | ORAL_TABLET | Freq: Two times a day (BID) | ORAL | Status: DC | PRN

## 2020-02-14 NOTE — Progress Notes (Signed)
  Echocardiogram 2D Echocardiogram has been performed.  Shawna Kiener A Jefrey Raburn 02/14/2020, 4:01 PM

## 2020-02-14 NOTE — ED Provider Notes (Signed)
[facility] EMERGENCY DEPARTMENT Provider Note   CSN: [id number] Arrival date & time: 02/14/20  [id number]     History Chief Complaint  Patient presents with  . Chest Pain    Heidi Wolf is a 79 y.o. female.  79 y.o female with a PMH of Palpitations, MI, DM, CAD presents to the ED with a chief complaint of left sided chest pain x yesterday. Patient describes as left-sided, constant with radiation to bilateral arms, reports she felt some tingling to both of her arms yesterday, attributed this to her arthritis.  She also reports taking nitro today as she woke up with palpitations along with 10 out of 10 chest pain, reports nitro did help with her palpitations that did not help with her chest pain. She called Dr. Cherlyn Cushing', cardiology office and was instructed to be seen in the ED. patient does report a similar episode in the past, around 2017 where she had a cardiac cath placed.  She is currently not on any blood thinners that she reports, she also endorses shortness of breath, this is in combination with the chest pain.  No alleviating or exacerbating factors.  Denies any fevers, abdominal pain, nausea, prior history of smoking.  The history is provided by the patient and medical records.    HPI: A 79 year old patient with a history of treated diabetes, hypertension and hypercholesterolemia presents for evaluation of chest pain. Initial onset of pain was more than 6 hours ago. The patient's chest pain is described as heaviness/pressure/tightness, is worse with exertion and is relieved by nitroglycerin. The patient's chest pain is middle- or left-sided, is not well-localized, is not sharp and does radiate to the arms/jaw/neck. The patient does not complain of nausea and denies diaphoresis. The patient has no history of stroke, has no history of peripheral artery disease, has not smoked in the past 90 days, has no relevant family history of coronary artery disease (first degree  relative at less than age 77) and does not have an elevated BMI (>=30).   Past Medical History:  Diagnosis Date  . Anemia   . Anxiety   . Arthritis    "right hip" (09/21/2014)  . CAD (coronary artery disease)    PCI 90% diagonal stenosis witha DES July 2017.  Nonobstructive disease elsewhere.   Marland Kitchen CAD (coronary artery disease)    PCI 90% diagonal stenosis witha DES July 2017.  Nonobstructive disease elsewhere.   . Cataracts, both eyes   . Colon polyps    Hyperplastic 2003  . External hemorrhoid   . Fatigue 05/21/2017  . GERD (gastroesophageal reflux disease)   . Glaucoma   . Hand pain, right 10/05/2019  . Hyperlipidemia   . Hypertension   . Internal hemorrhoid   . Medication management 02/24/2019  . Myocardial infarction ([place])   . Palpitations 09/03/2016   Holter Aug 2017- PACs, no arrhythmia.   . Personal history of colonic polyps 03/03/2012  . Type II diabetes mellitus ([place])    type 2    Patient Active Problem List   Diagnosis Date Noted  . Chest pain 02/14/2020  . Ankle pain 02/04/2020  . Sprain of left rotator cuff capsule 05/27/2018  . Thyroid nodule 04/09/2018  . GERD (gastroesophageal reflux disease)   . Cataracts, both eyes   . Arthritis   . Allergy   . Essential hypertension 09/03/2016  . Dyslipidemia 09/03/2016  . 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 CAD- S/P PCI July 2017 07/02/2016  .  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 Personal history of colonic polyps 03/03/2012  . Internal hemorrhoids without mention of complication [id number]    Past Surgical History:  Procedure Laterality Date  . ABDOMINAL HYSTERECTOMY    . APPENDECTOMY     "w/gallbladder"  . CARDIAC CATHETERIZATION  X 2  . CARDIAC CATHETERIZATION N/A 07/03/2016   Procedure: Left Heart Cath and Coronary Angiography;  Surgeon: Dixie Dials, MD;  Location: [place] [facility];  Service: Cardiovascular;  Laterality: N/A;  . CARDIAC  CATHETERIZATION N/A 07/03/2016   Procedure: Coronary Stent Intervention;  Surgeon: Jettie Booze, MD;  Location: [place] [facility];  Service: Cardiovascular;  Laterality: N/A;  . CARDIAC CATHETERIZATION N/A 07/15/2016   Procedure: Left Heart Cath and Coronary Angiography;  Surgeon: Dixie Dials, MD;  Location: [place] [facility];  Service: Cardiovascular;  Laterality: N/A;  . CHOLECYSTECTOMY    . COLONOSCOPY    . CORONARY ANGIOPLASTY WITH STENT PLACEMENT  06/2016  . TUBAL LIGATION  1971     OB History   No obstetric history on file.     Family History  Problem Relation Age of Onset  . Diabetes Mother   . Arthritis Mother   . CAD Mother 3       CABG  . CAD Brother   . CAD Sister 41       CABG  . Stroke Brother   . Stroke Daughter   . Diabetes Maternal Grandmother   . Stroke Maternal Grandmother   . Stroke Father   . Heart failure Sister   . Kidney failure Sister   . Breast cancer Other   . Colon cancer Neg Hx   . Stomach cancer Neg Hx   . Esophageal cancer Neg Hx   . Pancreatic cancer Neg Hx   . Rectal cancer Neg Hx     Social History   Tobacco Use  . Smoking status: Never Smoker  . Smokeless tobacco: Never Used  Substance Use Topics  . Alcohol use: No  . Drug use: No    Home Medications Prior to Admission medications   Medication Sig Start Date End Date Taking? Authorizing Provider  ACCU-CHEK SOFTCLIX LANCETS lancets Daily as needed. 05/26/18   Rogue Bussing, MD  acetaminophen (TYLENOL) 325 MG tablet Take 2 tablets (650 mg total) by mouth 3 (three) times daily as needed for mild pain or moderate pain. 04/08/18   Rogue Bussing, MD  amLODipine (NORVASC) 10 MG tablet TAKE 1 TABLET(10 MG) BY MOUTH DAILY 10/05/19   [name], DO  aspirin EC 81 MG tablet Take 1 tablet (81 mg total) by mouth daily. 07/04/16   Dixie Dials, MD  atorvastatin (LIPITOR) 80 MG tablet Take 1 tablet (80 mg total) by mouth every evening. 01/05/19   Minus Breeding, MD  benazepril (LOTENSIN) 20 MG tablet TAKE 1 TABLET(20 MG) BY MOUTH DAILY 10/04/19   Minus Breeding, MD  cholecalciferol (VITAMIN D) 1000 UNITS tablet Take 1,000 Units by mouth every other day.     [provider]  glucose blood (ACCU-CHEK AVIVA PLUS) test strip Check blood sugar up to once daily as needed. 05/03/19   [name], DO  loratadine (CLARITIN) 10 MG tablet Take 10 mg by mouth daily as needed for allergies.     [provider]  LUMIGAN 0.01 % SOLN Place 1 drop into both eyes at bedtime.  04/13/14   [provider]  metoprolol tartrate (LOPRESSOR)  25 MG tablet TAKE 1/2 TABLET BY MOUTH IN THE MORNING AND 1 TABLET AT BEDTIME 10/04/19   Minus Breeding, MD  nitroGLYCERIN (NITROSTAT) 0.4 MG SL tablet Place 1 tablet (0.4 mg total) under the tongue every 5 (five) minutes as needed for chest pain. 02/24/19   Minus Breeding, MD  pantoprazole (PROTONIX) 40 MG tablet TAKE 1 TABLET(40 MG) BY MOUTH DAILY 10/04/19   Minus Breeding, MD  polyethylene glycol powder (GLYCOLAX/MIRALAX) powder Use 1 tsp daily as needed for constipation. Mix into liquid. 04/08/18   Rogue Bussing, MD  potassium chloride SA (K-DUR) 20 MEQ tablet TAKE 1 TABLET BY MOUTH EVERY DAY 06/10/19   Minus Breeding, MD  trolamine salicylate (ASPERCREME) 10 % cream Apply 1 application topically as needed for muscle pain.    [provider]    Allergies    Aspirin  Review of Systems   Review of Systems  Constitutional: Negative for fever.  HENT: Negative for sore throat.   Respiratory: Negative for shortness of breath.   Cardiovascular: Positive for chest pain and palpitations. Negative for leg swelling.  Gastrointestinal: Negative for abdominal pain, diarrhea, nausea and vomiting.  Genitourinary: Negative for flank pain.  Musculoskeletal: Negative for back pain.  Skin: Negative for pallor and wound.  Neurological: Negative for light-headedness and headaches.  All other  systems reviewed and are negative.   Physical Exam Updated Vital Signs BP (!) 153/76 (BP Location: Left Arm)   Pulse (!) 51   Temp 98.5 F (36.9 C) (Oral)   Resp 11   SpO2 100%   Physical Exam Vitals and nursing note reviewed.  Constitutional:      General: She is not in acute distress.    Appearance: She is well-developed. She is not ill-appearing or toxic-appearing.  HENT:     Head: Normocephalic and atraumatic.     Mouth/Throat:     Pharynx: No oropharyngeal exudate.  Eyes:     Pupils: Pupils are equal, round, and reactive to light.  Cardiovascular:     Rate and Rhythm: Regular rhythm.     Heart sounds: Murmur present.     Comments: No bilateral pitting edema, no calf tenderness. Pulmonary:     Effort: Pulmonary effort is normal. No respiratory distress.     Breath sounds: Normal breath sounds. No decreased breath sounds, wheezing or rhonchi.  Chest:     Chest wall: Tenderness present.     Comments: Mild tenderness with palpation of the substernal chest wall. Abdominal:     General: Bowel sounds are normal. There is no distension.     Palpations: Abdomen is soft.     Tenderness: There is no abdominal tenderness.  Musculoskeletal:        General: No tenderness or deformity.     Cervical back: Normal range of motion.     Right lower leg: No edema.     Left lower leg: No edema.  Skin:    General: Skin is warm and dry.  Neurological:     Mental Status: She is alert and oriented to person, place, and time.     ED Results / Procedures / Treatments   Labs (all labs ordered are listed, but only abnormal results are displayed) Labs Reviewed  BASIC METABOLIC PANEL - Abnormal; Notable for the following components:      Result Value   CO2 21 (*)    Glucose, Bld 111 (*)    GFR calc non Af Amer 60 (*)    All other  components within normal limits  CBC WITH DIFFERENTIAL/PLATELET  MAGNESIUM  TSH  HEMOGLOBIN A1C  TROPONIN I (HIGH SENSITIVITY)  TROPONIN I (HIGH  SENSITIVITY)    EKG EKG Interpretation  Date/Time:  Tuesday February 14 2020 09:42:08 EST Ventricular Rate:  69 PR Interval:  156 QRS Duration: 93 QT Interval:  430 QTC Calculation: 461 R Axis:   70 Text Interpretation: Sinus rhythm Abnormal R-wave progression, early transition Probable LVH with secondary repol abnrm since last tracing no significant change Confirmed by Malvin Johns [phone]) on 02/14/2020 9:50:44 AM   Radiology DG Chest Portable 1 View  Result Date: 02/14/2020 CLINICAL DATA:  Chest pain EXAM: PORTABLE CHEST 1 VIEW COMPARISON:  02/06/2020 FINDINGS: The heart size and mediastinal contours are within normal limits. Both lungs are clear. The visualized skeletal structures are unremarkable. IMPRESSION: No active disease. Electronically Signed   By: Inez Catalina M.D.   On: 02/14/2020 10:11    Procedures Procedures (including critical care time)  Medications Ordered in ED Medications  aspirin chewable tablet 324 mg (324 mg Oral Given 02/14/20 0957)  acetaminophen (TYLENOL) tablet 650 mg (has no administration in time range)  aspirin EC tablet 81 mg (81 mg Oral Not Given 02/14/20 1338)  amLODipine (NORVASC) tablet 10 mg (has no administration in time range)  atorvastatin (LIPITOR) tablet 80 mg (has no administration in time range)  benazepril (LOTENSIN) tablet 20 mg (has no administration in time range)  metoprolol tartrate (LOPRESSOR) tablet 12.5 mg (has no administration in time range)  pantoprazole (PROTONIX) EC tablet 40 mg (has no administration in time range)  aspirin EC tablet 81 mg (has no administration in time range)  nitroGLYCERIN (NITROSTAT) SL tablet 0.4 mg (has no administration in time range)  acetaminophen (TYLENOL) tablet 650 mg (has no administration in time range)  ondansetron (ZOFRAN) injection 4 mg (has no administration in time range)  heparin injection 5,000 Units (has no administration in time range)  sodium chloride flush (NS) 0.9 % injection 3  mL (has no administration in time range)  sodium chloride flush (NS) 0.9 % injection 3 mL (has no administration in time range)  0.9 %  sodium chloride infusion (has no administration in time range)  zolpidem (AMBIEN) tablet 5 mg (has no administration in time range)  ALPRAZolam (XANAX) tablet 0.25 mg (has no administration in time range)  insulin aspart (novoLOG) injection 0-20 Units (has no administration in time range)  insulin aspart (novoLOG) injection 0-5 Units (has no administration in time range)    ED Course  I have reviewed the triage vital signs and the nursing notes.  Pertinent labs & imaging results that were available during my care of the patient were reviewed by me and considered in my medical decision making (see chart for details).    MDM Rules/Calculators/A&P    The past medical history of cardiac stent, CAD, hypertension presents to the ED with complaints of left-sided chest pain along with palpitation which began yesterday and radiation to her bilateral arms.  Has taken nitro which help with palpitations however she still reports her left-sided chest pain continues to be a 9 out of 10.  Provided with aspirin x4 on today's visit.  Position of her EKG without any ST elevations, normal sinus rhythm.  Labs remarkable for a CBC without any leukocytosis, no signs of anemia.  Magnesium is within normal limits without any prior history of arrhythmias.  BMP without any electrolyte abnormality, creatinine level is within normal limits.  First troponin is negative.  I personally evaluated chest x-ray without any consolation, pneumothorax, pleural effusion.  HEART SCORE 6  I have spoken to [facility] of cardiology who reports patient will be evaluated in the ED by cardiology along with proper disposition.  Patient evaluated by cardiology and admitted for further management.    Portions of this note were generated with [employer]. Dictation errors may occur despite  best attempts at proofreading.  Final Clinical Impression(s) / ED Diagnoses Final diagnoses:  Atypical chest pain    Rx / DC Orders ED Discharge Orders    None       Janeece Fitting, PA-C 02/14/20 1341    Malvin Johns, MD 02/20/20 1016

## 2020-02-14 NOTE — ED Triage Notes (Signed)
Pt reports waking up today and having left sided chest pain with radiation to left arm. Reports taking a nitro and that helped with the palpitations. Pain 9 out of 10. Hx of stents

## 2020-02-14 NOTE — Telephone Encounter (Signed)
Spoke with pt who report light palpitations throughout the night that has increased in severity this morning. She also report light left sided chest pain that radiates up her neck and down arm. Pt state she has taken 1 nitro this morning but pain still present. Nurse advised pt to seek medical attention for further evaluations. Pt verbalized understanding and Trish notified.

## 2020-02-14 NOTE — ED Notes (Signed)
Echo at bedside

## 2020-02-14 NOTE — Telephone Encounter (Signed)
Patient c/o Palpitations:  High priority if patient c/o lightheadedness, shortness of breath, or chest pain  1) How long have you had palpitations/irregular HR/ Afib? Are you having the symptoms now? On and off through the night, yes  2) Are you currently experiencing lightheadedness, SOB or CP? no  3) Do you have a history of afib (atrial fibrillation) or irregular heart rhythm? Not sure  4) Have you checked your BP or HR? (document readings if available): no  5) Are you experiencing any other symptoms? Weak, and hands are sore and hard to grip  Patient calling stating last night she started having palpitations on and off. She also says her hands are sore it's hard to grip and she feels very weak. Please advise.

## 2020-02-14 NOTE — H&P (Addendum)
Cardiology Admission History and Physical:   Patient ID: Heidi Wolf MRN: [id number]; DOB: 1941/09/28   Admission date: 02/14/2020  Primary Care Provider: [name], DO Primary Cardiologist: Minus Breeding, MD  Primary Electrophysiologist:  None   Chief Complaint: Chest pain  Patient Profile:   Heidi Wolf is a 79 y.o. female with diabetes, hypertension, palpitations, hyperlipidemia, arthritis, CAD s/p DES to Dx1 in 2017 followed by relook cath 07/15/2016 which showed patent stent who is evaluated for chest pain.  History of Present Illness:   Heidi Wolf is followed by Dr. Percival Spanish.  Has a history of CAD s/p PCI DES to Dx1 2017.  Relook 07/15/2016 showed patent stent.  No other significant CAD noted and LV wall motion was normal.  Also complained of intermittent palpitations.  A Holter monitor was ordered August 2017 which showed PACs and her beta-blocker was increased.  Patient was last seen 02/24/2019 and was doing well from a cardiac standpoint.  She had some musculoskeletal chest pain from PT.  She did note some palpitations but beta-blocker could not be titrated due to possible bradycardia.  Patient presented to the ED 02/14/2020 for chest pain. The patient says she has been having intermittent chest pain for a couple weeks. 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 This morning she had a severe episode of chest pain that woke her up, 9/10. It was a tightness and pressure in the middle to left side of the chest. She felt the pain going down both of her arms and felt her arms were numb. She felt associated shortness of breath. No N/V, diaphoresis. She feels ambulation did make the pain worse. She took NTG x1 which improved the pain. She called cardiology office and was instructed to go to the ED.  Patient said this is similar episode in the past before she had a stent placed in 2017. She takes  Aspirin daily  In the ED blood pressure 153/73, pulse 57, afebrile, respiratory rate 14, 99% O2.  Mild tenderness on chest wall noted.  Labs showed potassium 3.5, glucose 111, creatinine 0.92, magnesium 1.9.  WBC 9.4, hemoglobin 12.7.  HS troponin 11.  X-ray unremarkable.  EKG shows LVH with diffuse T wave inversions.  Cardiology was consulted for possible admission.  Heart Pathway Score:     Past Medical History:  Diagnosis Date  . Anemia   . Anxiety   . Arthritis    "right hip" (09/21/2014)  . CAD (coronary artery disease)    PCI 90% diagonal stenosis witha DES July 2017.  Nonobstructive disease elsewhere.   Marland Kitchen CAD (coronary artery disease)    PCI 90% diagonal stenosis witha DES July 2017.  Nonobstructive disease elsewhere.   . Cataracts, both eyes   . Colon polyps    Hyperplastic 2003  . External hemorrhoid   . Fatigue 05/21/2017  . GERD (gastroesophageal reflux disease)   . Glaucoma   . Hand pain, right 10/05/2019  . Hyperlipidemia   . Hypertension   . Internal hemorrhoid   . Medication management 02/24/2019  . Myocardial infarction ([place])   . Palpitations 09/03/2016   Holter Aug 2017- PACs, no arrhythmia.   . Personal history of colonic polyps 03/03/2012  . Type II diabetes mellitus ([place])    type 2    Past Surgical History:  Procedure Laterality Date  . ABDOMINAL HYSTERECTOMY    . APPENDECTOMY     "  w/gallbladder"  . CARDIAC CATHETERIZATION  X 2  . CARDIAC CATHETERIZATION N/A 07/03/2016   Procedure: Left Heart Cath and Coronary Angiography;  Surgeon: Dixie Dials, MD;  Location: [facility];  Service: Cardiovascular;  Laterality: N/A;  . CARDIAC CATHETERIZATION N/A 07/03/2016   Procedure: Coronary Stent Intervention;  Surgeon: Jettie Booze, MD;  Location: [facility];  Service: Cardiovascular;  Laterality: N/A;  . CARDIAC CATHETERIZATION N/A 07/15/2016   Procedure: Left Heart Cath and Coronary Angiography;  Surgeon: Dixie Dials, MD;  Location: [facility];  Service: Cardiovascular;  Laterality: N/A;  . CHOLECYSTECTOMY    . COLONOSCOPY    . CORONARY ANGIOPLASTY WITH STENT PLACEMENT  06/2016  . TUBAL LIGATION  1971     Medications Prior to Admission: Prior to Admission medications   Medication Sig Start Date End Date Taking? Authorizing Provider  ACCU-CHEK SOFTCLIX LANCETS lancets Daily as needed. 05/26/18   Rogue Bussing, MD  acetaminophen (TYLENOL) 325 MG tablet Take 2 tablets (650 mg total) by mouth 3 (three) times daily as needed for mild pain or moderate pain. 04/08/18   Rogue Bussing, MD  amLODipine (NORVASC) 10 MG tablet TAKE 1 TABLET(10 MG) BY MOUTH DAILY 10/05/19   [name], DO  aspirin EC 81 MG tablet Take 1 tablet (81 mg total) by mouth daily. 07/04/16   Dixie Dials, MD  atorvastatin (LIPITOR) 80 MG tablet Take 1 tablet (80 mg total) by mouth every evening. 01/05/19   Minus Breeding, MD  benazepril (LOTENSIN) 20 MG tablet TAKE 1 TABLET(20 MG) BY MOUTH DAILY 10/04/19   Minus Breeding, MD  cholecalciferol (VITAMIN D) 1000 UNITS tablet Take 1,000 Units by mouth every other day.     [provider]  glucose blood (ACCU-CHEK AVIVA PLUS) test strip Check blood sugar up to once daily as needed. 05/03/19   [name], DO  loratadine (CLARITIN) 10 MG tablet Take 10 mg by mouth daily as needed for allergies.     [provider]  LUMIGAN 0.01 % SOLN Place 1 drop into both eyes at bedtime.  04/13/14   [provider]  metoprolol tartrate (LOPRESSOR) 25 MG tablet TAKE 1/2 TABLET BY MOUTH IN THE MORNING AND 1 TABLET AT BEDTIME 10/04/19   Minus Breeding, MD  nitroGLYCERIN (NITROSTAT) 0.4 MG SL tablet Place 1 tablet (0.4 mg total) under the tongue every 5 (five) minutes as needed for chest pain. 02/24/19   Minus Breeding, MD  pantoprazole (PROTONIX) 40 MG tablet TAKE 1 TABLET(40 MG) BY MOUTH DAILY 10/04/19   Minus Breeding, MD  polyethylene glycol powder (GLYCOLAX/MIRALAX) powder Use 1 tsp  daily as needed for constipation. Mix into liquid. 04/08/18   Rogue Bussing, MD  potassium chloride SA (K-DUR) 20 MEQ tablet TAKE 1 TABLET BY MOUTH EVERY DAY 06/10/19   Minus Breeding, MD  trolamine salicylate (ASPERCREME) 10 % cream Apply 1 application topically as needed for muscle pain.    [provider]     Allergies:    Allergies  Allergen Reactions  . Aspirin Other (See Comments)    Makes stomach burn    Social History:   Social History   Socioeconomic History  . Marital status: Legally Separated    Spouse name: Not on file  . Number of children: 3  . Years of education: Not on file  . Highest education level: Not on file  Occupational History  . Occupation: retired  Tobacco Use  . Smoking  status: Never Smoker  . Smokeless tobacco: Never Used  Substance and Sexual Activity  . Alcohol use: No  . Drug use: No  . Sexual activity: Never  Other Topics Concern  . Not on file  Social History Narrative   LIves with Tammy   Social Determinants of Health   Financial Resource Strain:   . Difficulty of Paying Living Expenses: Not on file  Food Insecurity:   . Worried About Charity fundraiser in the Last Year: Not on file  . Ran Out of Food in the Last Year: Not on file  Transportation Needs:   . Lack of Transportation (Medical): Not on file  . Lack of Transportation (Non-Medical): Not on file  Physical Activity:   . Days of Exercise per Week: Not on file  . Minutes of Exercise per Session: Not on file  Stress:   . Feeling of Stress : Not on file  Social Connections:   . Frequency of Communication with Friends and Family: Not on file  . Frequency of Social Gatherings with Friends and Family: Not on file  . Attends Religious Services: Not on file  . Active Member of Clubs or Organizations: Not on file  . Attends Archivist Meetings: Not on file  . Marital Status: Not on file  Intimate Partner Violence:   . Fear of Current or Ex-Partner:  Not on file  . Emotionally Abused: Not on file  . Physically Abused: Not on file  . Sexually Abused: Not on file    Family History:   The patient's family history includes Arthritis in her mother; Breast cancer in an other family member; CAD in her brother; CAD (age of onset: 77) in her mother; CAD (age of onset: 28) in her sister; Diabetes in her maternal grandmother and mother; Heart failure in her sister; Kidney failure in her sister; Stroke in her brother, daughter, father, and maternal grandmother. There is no history of Colon cancer, Stomach cancer, Esophageal cancer, Pancreatic cancer, or Rectal cancer.    ROS:  Please see the history of present illness.  All other ROS reviewed and negative.     Physical Exam/Data:   Vitals:   02/14/20 0941 02/14/20 1000 02/14/20 1030 02/14/20 1100  BP: (!) 149/85 (!) 148/77 (!) 153/73 (!) 151/71  Pulse: 64 60 (!) 57 (!) 58  Resp: 11 14 14 17   Temp:      TempSrc:      SpO2: 98% 98% 99% 99%   No intake or output data in the 24 hours ending 02/14/20 1218 Last 3 Weights 02/03/2020 10/05/2019 06/30/2019  Weight (lbs) 155 lb 3.2 oz 159 lb 6.4 oz 160 lb  Weight (kg) 70.398 kg 72.303 kg 72.576 kg     There is no height or weight on file to calculate BMI.  General:  Well nourished, well developed, in no acute distress HEENT: normal Lymph: no adenopathy Neck: no JVD Endocrine:  No thryomegaly Vascular: No carotid bruits; FA pulses 2+ bilaterally without bruits  Cardiac:  normal S1, S2; RRR; systolic murmur  Lungs:  clear to auscultation bilaterally, no wheezing, rhonchi or rales  Abd: soft, nontender, no hepatomegaly  Ext: no edema Musculoskeletal:  No deformities, BUE and BLE strength normal and equal Skin: warm and dry  Neuro:  CNs 2-12 intact, no focal abnormalities noted Psych:  Normal affect    EKG:  The ECG that was done 02/14/2020 was personally reviewed and demonstrates normal sinus rhythm with LVH and repol abnormalities,  minimal ST  depression diffuse  Relevant CV Studies: Cardiac cath 07/15/2016  Ost 2nd Diag lesion, 30% stenosed. The lesion was not previously treated.  Ost Cx to Prox Cx lesion, 20% stenosed. The lesion was not previously treated.  Prox Cx lesion, 25% stenosed. The lesion was not previously treated.   Check platelet inhibition and consider Brilinta use.  Diagnostic Dominance: Right    Laboratory Data:  High Sensitivity Troponin:   Recent Labs  Lab 02/14/20 0944  TROPONINIHS 11      Chemistry Recent Labs  Lab 02/14/20 0944  NA 140  K 3.5  CL 106  CO2 21*  GLUCOSE 111*  BUN 11  CREATININE 0.92  CALCIUM 9.3  GFRNONAA 60*  GFRAA >60  ANIONGAP 13    No results for input(s): PROT, ALBUMIN, AST, ALT, ALKPHOS, BILITOT in the last 168 hours. Hematology Recent Labs  Lab 02/14/20 0944  WBC 9.4  RBC 4.75  HGB 12.7  HCT 39.9  MCV 84.0  MCH 26.7  MCHC 31.8  RDW 14.1  PLT 341   BNPNo results for input(s): BNP, PROBNP in the last 168 hours.  DDimer No results for input(s): DDIMER in the last 168 hours.   Radiology/Studies:  DG Chest Portable 1 View  Result Date: 02/14/2020 CLINICAL DATA:  Chest pain EXAM: PORTABLE CHEST 1 VIEW COMPARISON:  02/06/2020 FINDINGS: The heart size and mediastinal contours are within normal limits. Both lungs are clear. The visualized skeletal structures are unremarkable. IMPRESSION: No active disease. Electronically Signed   By: Inez Catalina M.D.   On: 02/14/2020 10:11   { HEAR Score (for undifferentiated chest pain):  HEAR Score: 7    Assessment and Plan:   Chest pain /CAD s/p DES Dx1 2017 Patient presents with left-sided chest pain improved with nitroglycerin. HS troponin 11>13.  EKG with VH,changed from prior.  Chest x-ray unremarkable. - Patient had PCI 2017 DES in the second diagonal lesion and relook cath later that month showed a patent stent.  Patient was on aspirin and Plavix for 6 to 12 months.   - continue aspirin -Plan to admit  patient and continue to trend troponins - check echo - Can consider cardiac cath if pain reoccurs of troponin trends up. Will discuss with MD. NPO after midnight for possible procedure. - Continue beta-blocker and statin  Hypertension -At baseline patient is on amlodipine 10 mg daily, benazepril 20 mg daily, Lopressor 12.5 mg daily - Pressures slightly up  Hyperlipidemia -At home patient takes Lipitor 80 mg daily -LDL 66 on 02/24/2019.  Recheck lipid panel  Diabetes II -A1c 5.9 recently  -SSI   Severity of Illness: The appropriate patient status for this patient is OBSERVATION. Observation status is judged to be reasonable and necessary in order to provide the required intensity of service to ensure the patient's safety. The patient's presenting symptoms, physical exam findings, and initial radiographic and laboratory data in the context of their medical condition is felt to place them at decreased risk for further clinical deterioration. Furthermore, it is anticipated that the patient will be medically stable for discharge from the hospital within 2 midnights of admission. The following factors support the patient status of observation.   " The patient's presenting symptoms include chest pain . " The physical exam findings include chest pain. " The initial radiographic and laboratory data are EKG with LVH.     For questions or updates, please contact [place] Please consult [email or web address] for contact info under  Signed, Cadence Ninfa Meeker, PA-C  02/14/2020 12:18 PM   I have personally seen and examined this patient. I agree with the assessment and plan as outlined above.  79 yo female with history of DM, HTN, HLD, palpitations, arthritis and CAD. She had stenting of the Diagonal branch in 2017. No recent echocardiogram. She has been having chest and shoulder pain for several weeks. She has felt that this was due to her arthritis. This morning she woke up with severe upper  chest, neck pain with radiation down both arms. Perhaps mild dyspnea. No associated N/V/diaphoresis. The pain persisted and she was instructed to come to the ED by the [facility] office.  Troponin negative x 2. EKG with sinus, LVH and no acute ischemic changes.  All labs and  EKG reviewed by me personally My exam:  General: Well developed, well nourished, NAD  HEENT: OP clear, mucus membranes moist  SKIN: warm, dry. No rashes. Neuro: No focal deficits  Musculoskeletal: Muscle strength 5/5 all ext  Psychiatric: Mood and affect normal  Neck: No JVD, no carotid bruits, no thyromegaly, no lymphadenopathy.  Lungs:Clear bilaterally, no wheezes, rhonci, crackles Cardiovascular: Regular rate and rhythm. No murmurs, gallops or rubs. Abdomen:Soft. Bowel sounds present. Non-tender.  Extremities: No lower extremity edema. Pulses are 2 + in the bilateral DP/PT.  Plan: Atypical chest pain in patient with known CAD. No objective evidence of ischemia at this time. I suspect that this is non-cardiac chest pain. Will admit to observation on telemetry, cycle troponin and arrange an echo. Further planning based on the initial testing.   Lauree Chandler 02/14/2020 1:38 PM

## 2020-02-15 DIAGNOSIS — R0789 Other chest pain: Secondary | ICD-10-CM | POA: Diagnosis not present

## 2020-02-15 DIAGNOSIS — R072 Precordial pain: Secondary | ICD-10-CM | POA: Diagnosis not present

## 2020-02-15 DIAGNOSIS — E876 Hypokalemia: Secondary | ICD-10-CM

## 2020-02-15 LAB — BASIC METABOLIC PANEL
Anion gap: 11 (ref 5–15)
BUN: 12 mg/dL (ref 8–23)
CO2: 24 mmol/L (ref 22–32)
Calcium: 9.4 mg/dL (ref 8.9–10.3)
Chloride: 105 mmol/L (ref 98–111)
Creatinine, Ser: 0.88 mg/dL (ref 0.44–1.00)
GFR calc Af Amer: >60 mL/min (ref >60)
GFR calc non Af Amer: >60 mL/min (ref >60)
Glucose, Bld: 94 mg/dL (ref 70–99)
Potassium: 3.4 mmol/L — ABNORMAL LOW (ref 3.5–5.1)
Sodium: 140 mmol/L (ref 135–145)

## 2020-02-15 LAB — CBC
HCT: 38.3 % (ref 36.0–46.0)
Hemoglobin: 12.1 g/dL (ref 12.0–15.0)
MCH: 26.2 pg (ref 26.0–34.0)
MCHC: 31.6 g/dL (ref 30.0–36.0)
MCV: 83.1 fL (ref 80.0–100.0)
Platelets: 339 10*3/uL (ref 150–400)
RBC: 4.61 MIL/uL (ref 3.87–5.11)
RDW: 14.3 % (ref 11.5–15.5)
WBC: 8 10*3/uL (ref 4.0–10.5)
nRBC: 0 % (ref 0.0–0.2)

## 2020-02-15 LAB — TROPONIN I (HIGH SENSITIVITY): Troponin I (High Sensitivity): 13 ng/L (ref <18)

## 2020-02-15 LAB — GLUCOSE, CAPILLARY: Glucose-Capillary: 100 mg/dL — ABNORMAL HIGH (ref 70–99)

## 2020-02-15 LAB — LIPID PANEL
Cholesterol: 162 mg/dL (ref 0–200)
HDL: 36 mg/dL — ABNORMAL LOW (ref >40)
LDL Cholesterol: 98 mg/dL (ref 0–99)
Total CHOL/HDL Ratio: 4.5 RATIO
Triglycerides: 140 mg/dL (ref <150)
VLDL: 28 mg/dL (ref 0–40)

## 2020-02-15 MED ORDER — POTASSIUM CHLORIDE 20 MEQ/15ML (10%) PO SOLN
40.0000 meq | Freq: Once | ORAL | Status: AC
  Administered 2020-02-15: 40 meq via ORAL
  Filled 2020-02-15: qty 30

## 2020-02-15 NOTE — Plan of Care (Signed)
  Problem: Education: Goal: Knowledge of General Education information will improve Description: Including pain rating scale, medication(s)/side effects and non-pharmacologic comfort measures Outcome: Progressing   Problem: Clinical Measurements: Goal: Ability to maintain clinical measurements within normal limits will improve Outcome: Progressing   

## 2020-02-15 NOTE — Progress Notes (Addendum)
Patient c/o of R shoulder pain last night, Tylenol adm around midnight, patient said pain was relieved. VS were 121/65, 55, 99% RA. Trop 11, 13, 13. Prep for possible cath lab, remain NPO.
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
## 2020-02-15 NOTE — Progress Notes (Signed)
Progress Note  Patient Name: Heidi Wolf Date of Encounter: 02/15/2020  Primary Cardiologist: Minus Breeding, MD   Subjective   Neck pain and shoulder pain, typical of her arthritis. No chest pain.   Inpatient Medications    Scheduled Meds: . amLODipine  10 mg Oral Daily  . aspirin EC  81 mg Oral Daily  . atorvastatin  80 mg Oral QPM  . benazepril  20 mg Oral Daily  . bimatoprost  1 drop Both Eyes QHS  . heparin  5,000 Units Subcutaneous Q8H  . insulin aspart  0-20 Units Subcutaneous TID WC  . insulin aspart  0-5 Units Subcutaneous QHS  . metoprolol tartrate  12.5 mg Oral BID  . pantoprazole  40 mg Oral Daily  . sodium chloride flush  3 mL Intravenous Q12H   Continuous Infusions: . sodium chloride     PRN Meds: sodium chloride, acetaminophen, ALPRAZolam, nitroGLYCERIN, ondansetron (ZOFRAN) IV, sodium chloride flush, zolpidem   Vital Signs    Vitals:   02/15/20 0006 02/15/20 0447 02/15/20 0500 02/15/20 0724  BP:  115/76  120/61  Pulse:  (!) 56  (!) 53  Resp:  16  16  Temp:  98.1 F (36.7 C)  98.5 F (36.9 C)  TempSrc:  Oral  Oral  SpO2:  99%  100%  Weight: 67 kg  66.1 kg   Height:        Intake/Output Summary (Last 24 hours) at 02/15/2020 0854 Last data filed at 02/15/2020 0729 Gross per 24 hour  Intake 200 ml  Output 600 ml  Net -400 ml   Last 3 Weights 02/15/2020 02/15/2020 02/14/2020  Weight (lbs) 145 lb 12.8 oz 147 lb 12.8 oz 147 lb 12.8 oz  Weight (kg) 66.134 kg 67.042 kg 67.042 kg      Telemetry    sinus - Personally Reviewed  ECG    No AM EKG - Personally Reviewed  Physical Exam   GEN: No acute distress.   Neck: No JVD Cardiac: RRR, no murmurs, rubs, or gallops.  Respiratory: Clear to auscultation bilaterally. GI: Soft, nontender, non-distended  MS: No edema; No deformity. Neuro:  Nonfocal  Psych: Normal affect   Labs    High Sensitivity Troponin:   Recent Labs  Lab 02/14/20 0944 02/14/20 1220 02/14/20 2338   TROPONINIHS 11 13 13       Chemistry Recent Labs  Lab 02/14/20 0944 02/15/20 0433  NA 140 140  K 3.5 3.4*  CL 106 105  CO2 21* 24  GLUCOSE 111* 94  BUN 11 12  CREATININE 0.92 0.88  CALCIUM 9.3 9.4  GFRNONAA 60* >60  GFRAA >60 >60  ANIONGAP 13 11     Hematology Recent Labs  Lab 02/14/20 0944 02/15/20 0433  WBC 9.4 8.0  RBC 4.75 4.61  HGB 12.7 12.1  HCT 39.9 38.3  MCV 84.0 83.1  MCH 26.7 26.2  MCHC 31.8 31.6  RDW 14.1 14.3  PLT 341 339    BNPNo results for input(s): BNP, PROBNP in the last 168 hours.   DDimer No results for input(s): DDIMER in the last 168 hours.   Radiology    DG Chest Portable 1 View  Result Date: 02/14/2020 CLINICAL DATA:  Chest pain EXAM: PORTABLE CHEST 1 VIEW COMPARISON:  02/06/2020 FINDINGS: The heart size and mediastinal contours are within normal limits. Both lungs are clear. The visualized skeletal structures are unremarkable. IMPRESSION: No active disease. Electronically Signed   By: Inez Catalina M.D.   On:  02/14/2020 10:11   ECHOCARDIOGRAM COMPLETE  Result Date: 02/14/2020    ECHOCARDIOGRAM REPORT   Patient Name:   Heidi Wolf Date of Exam: 02/14/2020 Medical Rec #:  [id number]           Height:       64.0 in Accession #:    [id number]          Weight:       155.2 lb Date of Birth:  05-Mar-1941           BSA:          1.76 m Patient Age:    79 years            BP:           102/69 mmHg Patient Gender: F                   HR:           55 bpm. Exam Location:  Inpatient Procedure: 2D Echo Indications:    Chest Pain 786.50 / R07.9  History:        Patient has prior history of Echocardiogram examinations, most                 recent 06/23/2002. CAD; Risk Factors:Dyslipidemia, Hypertension                 and Diabetes.  Sonographer:    Vikki Ports Turrentine Referring Phys: [id number] [place]  1. Consider apical hypertrophy variant. There is no outflow obstruction. . Left ventricular ejection fraction, by estimation, is 70 to  75%. The left ventricle has hyperdynamic function. The left ventricle has no regional wall motion abnormalities. There  is moderate left ventricular hypertrophy of the apical segment. Left ventricular diastolic parameters are consistent with Grade I diastolic dysfunction (impaired relaxation).  2. Right ventricular systolic function is normal. The right ventricular size is normal. There is normal pulmonary artery systolic pressure.  3. Left atrial size was mildly dilated.  4. The mitral valve is normal in structure and function. Mild mitral valve regurgitation. No evidence of mitral stenosis.  5. The aortic valve is normal in structure and function. Aortic valve regurgitation is not visualized. No aortic stenosis is present.  6. The inferior vena cava is normal in size with greater than 50% respiratory variability, suggesting right atrial pressure of 3 mmHg. FINDINGS  Left Ventricle: Consider apical hypertrophy variant. There is no outflow obstruction. Left ventricular ejection fraction, by estimation, is 70 to 75%. The left ventricle has hyperdynamic function. The left ventricle has no regional wall motion abnormalities. The left ventricular internal cavity size was normal in size. There is moderate left ventricular hypertrophy of the apical segment. Left ventricular diastolic parameters are consistent with Grade I diastolic dysfunction (impaired relaxation). Right Ventricle: The right ventricular size is normal. No increase in right ventricular wall thickness. Right ventricular systolic function is normal. There is normal pulmonary artery systolic pressure. The tricuspid regurgitant velocity is 2.45 m/s, and  with an assumed right atrial pressure of 3 mmHg, the estimated right ventricular systolic pressure is 123456 mmHg. Left Atrium: Left atrial size was mildly dilated. Right Atrium: Right atrial size was normal in size. Pericardium: There is no evidence of pericardial effusion. Mitral Valve: The mitral valve is  normal in structure and function. Normal mobility of the mitral valve leaflets. Mild mitral valve regurgitation. No evidence of mitral valve stenosis. Tricuspid Valve: The tricuspid valve is normal in structure.  Tricuspid valve regurgitation is mild . No evidence of tricuspid stenosis. Aortic Valve: The aortic valve is normal in structure and function. Aortic valve regurgitation is not visualized. No aortic stenosis is present. Aortic valve mean gradient measures 5.0 mmHg. Aortic valve peak gradient measures 12.8 mmHg. Aortic valve area, by VTI measures 2.01 cm. Pulmonic Valve: The pulmonic valve was normal in structure. Pulmonic valve regurgitation is not visualized. No evidence of pulmonic stenosis. Aorta: The aortic root is normal in size and structure. Venous: The inferior vena cava is normal in size with greater than 50% respiratory variability, suggesting right atrial pressure of 3 mmHg. IAS/Shunts: No atrial level shunt detected by color flow Doppler.  LEFT VENTRICLE PLAX 2D LVIDd:         4.06 cm  Diastology LVIDs:         2.66 cm  LV e' lateral:   8.59 cm/s LV PW:         1.00 cm  LV E/e' lateral: 7.6 LV IVS:        0.95 cm  LV e' medial:    4.05 cm/s LVOT diam:     1.70 cm  LV E/e' medial:  16.1 LV SV:         64.46 ml LV SV Index:   25.83 LVOT Area:     2.27 cm  RIGHT VENTRICLE RV S prime:     11.60 cm/s TAPSE (M-mode): 1.8 cm LEFT ATRIUM             Index       RIGHT ATRIUM           Index LA diam:        4.30 cm 2.45 cm/m  RA Area:     11.50 cm LA Vol (A2C):   76.4 ml 43.50 ml/m RA Volume:   23.20 ml  13.21 ml/m LA Vol (A4C):   58.4 ml 33.25 ml/m LA Biplane Vol: 67.5 ml 38.43 ml/m  AORTIC VALVE AV Area (Vmax):    1.56 cm AV Area (Vmean):   1.87 cm AV Area (VTI):     2.01 cm AV Vmax:           179.00 cm/s AV Vmean:          106.000 cm/s AV VTI:            0.321 m AV Peak Grad:      12.8 mmHg AV Mean Grad:      5.0 mmHg LVOT Vmax:         123.00 cm/s LVOT Vmean:        87.500 cm/s LVOT VTI:           0.284 m LVOT/AV VTI ratio: 0.88  AORTA Ao Root diam: 2.80 cm MITRAL VALVE               TRICUSPID VALVE MV Area (PHT): 2.50 cm    TR Peak grad:   24.0 mmHg MV Decel Time: 303 msec    TR Vmax:        245.00 cm/s MV E velocity: 65.10 cm/s MV A velocity: 79.30 cm/s  SHUNTS MV E/A ratio:  0.82        Systemic VTI:  0.28 m                            Systemic Diam: 1.70 cm Candee Furbish MD Electronically signed by Candee Furbish MD Signature Date/Time: 02/14/2020/5:29:30 PM  Final     Cardiac Studies   Echo 02/14/20: 1. Consider apical hypertrophy variant. There is no outflow obstruction.  . Left ventricular ejection fraction, by estimation, is 70 to 75%. The  left ventricle has hyperdynamic function. The left ventricle has no  regional wall motion abnormalities. There  is moderate left ventricular hypertrophy of the apical segment. Left  ventricular diastolic parameters are consistent with Grade I diastolic  dysfunction (impaired relaxation).  2. Right ventricular systolic function is normal. The right ventricular  size is normal. There is normal pulmonary artery systolic pressure.  3. Left atrial size was mildly dilated.  4. The mitral valve is normal in structure and function. Mild mitral  valve regurgitation. No evidence of mitral stenosis.  5. The aortic valve is normal in structure and function. Aortic valve  regurgitation is not visualized. No aortic stenosis is present.  6. The inferior vena cava is normal in size with greater than 50%  respiratory variability, suggesting right atrial pressure of 3 mmHg.    Patient Profile     79 y.o. female  with diabetes, hypertension, palpitations, hyperlipidemia, arthritis, CAD s/p DES to Dx1 in 2017 followed by relook cath 07/15/2016 which showed patent stent who is admitted with chest pain.   Assessment & Plan    1. Chest pain: No objective evidence of ischemia. Echo with normal LV systolic function with no wall motion abnormalities. No  chest pain this am. Her pain seems to be non-cardiac. Will d/c home today.   She has f/u with Dr. Percival Spanish next week  2. Hypokalemia: Will replace this am.   For questions or updates, please contact [place] Please consult [email or web address] for contact info under        Signed, Lauree Chandler, MD  02/15/2020, 8:54 AM

## 2020-02-23 ENCOUNTER — Telehealth: Payer: Self-pay | Admitting: Cardiology

## 2020-02-23 DIAGNOSIS — Z7189 Other specified counseling: Secondary | ICD-10-CM | POA: Insufficient documentation

## 2020-02-23 HISTORY — DX: Other specified counseling: Z71.89
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
## 2020-02-23 NOTE — Telephone Encounter (Signed)
Patient has OV with Dr. Percival Spanish 2/26 @ 10:20 and her daughter has OV with Dr. Claiborne Billings 2/26 @ 11:20 Advised OK for each to attend each other's appointment as they take a cab together, per patient

## 2020-02-23 NOTE — Telephone Encounter (Signed)
New message:     Patient calling stating that her daughter has a apt with Dr. Claiborne Billings at 11:20 on 02/23/20 and she has one at 10:20 With Dr. Percival Spanish and they come together to help each other for apt. Please call patient back if any questions.

## 2020-02-24 ENCOUNTER — Ambulatory Visit (INDEPENDENT_AMBULATORY_CARE_PROVIDER_SITE_OTHER): Payer: Medicare Other | Admitting: Cardiology

## 2020-02-24 ENCOUNTER — Other Ambulatory Visit: Payer: Self-pay

## 2020-02-24 ENCOUNTER — Encounter: Payer: Self-pay | Admitting: Cardiology

## 2020-02-24 VITALS — BP 142/78 | HR 67 | Ht 64.0 in | Wt 153.0 lb

## 2020-02-24 DIAGNOSIS — Z7189 Other specified counseling: Secondary | ICD-10-CM | POA: Diagnosis not present

## 2020-02-24 DIAGNOSIS — R002 Palpitations: Secondary | ICD-10-CM | POA: Diagnosis not present

## 2020-02-24 DIAGNOSIS — E785 Hyperlipidemia, unspecified: Secondary | ICD-10-CM | POA: Diagnosis not present

## 2020-02-24 DIAGNOSIS — E118 Type 2 diabetes mellitus with unspecified complications: Secondary | ICD-10-CM | POA: Diagnosis not present

## 2020-02-24 DIAGNOSIS — I2510 Atherosclerotic heart disease of native coronary artery without angina pectoris: Secondary | ICD-10-CM | POA: Diagnosis not present

## 2020-02-24 DIAGNOSIS — I10 Essential (primary) hypertension: Secondary | ICD-10-CM

## 2020-02-24 MED ORDER — ATORVASTATIN CALCIUM 80 MG PO TABS: 80.0000 mg | ORAL_TABLET | Freq: Every evening | ORAL | 3 refills | Status: DC

## 2020-02-24 MED ORDER — AMLODIPINE BESYLATE 10 MG PO TABS: ORAL_TABLET | ORAL | 3 refills | Status: DC

## 2020-02-24 MED ORDER — BENAZEPRIL HCL 20 MG PO TABS: ORAL_TABLET | ORAL | 3 refills | Status: DC

## 2020-02-24 MED ORDER — METOPROLOL TARTRATE 25 MG PO TABS: ORAL_TABLET | ORAL | 3 refills | Status: DC

## 2020-02-24 NOTE — Patient Instructions (Signed)
Medication Instructions:  No Changes *If you need a refill on your cardiac medications before your next appointment, please call your pharmacy*   Lab Work: None If you have labs (blood work) drawn today and your tests are completely normal, you will receive your results only by: Marland Kitchen MyChart Message (if you have MyChart) OR . A paper copy in the mail If you have any lab test that is abnormal or we need to change your treatment, we will call you to review the results.   Testing/Procedures: None   Follow-Up: At [facility], you and your health needs are our priority.  As part of our continuing mission to provide you with exceptional heart care, we have created designated Provider Care Teams.  These Care Teams include your primary Cardiologist (physician) and Advanced Practice Providers (APPs -  Physician Assistants and Nurse Practitioners) who all work together to provide you with the care you need, when you need it.  We recommend signing up for the patient portal called "MyChart".  Sign up information is provided on this After Visit Summary.  MyChart is used to connect with patients for Virtual Visits (Telemedicine).  Patients are able to view lab/test results, encounter notes, upcoming appointments, etc.  Non-urgent messages can be sent to your provider as well.   To learn more about what you can do with MyChart, go to [email or web address].    Your next appointment:   6 month(s)  The format for your next appointment:   In Person  Provider:   You may see one of the following Advanced Practice Providers on your designated Care Team:    Rosaria Ferries, PA-C  Jory Sims, DNP, ANP  Almyra Deforest, [place]

## 2020-02-25 DIAGNOSIS — Z23 Encounter for immunization: Secondary | ICD-10-CM | POA: Diagnosis not present

## 2020-03-02 ENCOUNTER — Ambulatory Visit: Payer: Medicare Other | Admitting: Family Medicine

## 2020-03-02 ENCOUNTER — Other Ambulatory Visit: Payer: Self-pay

## 2020-03-02 ENCOUNTER — Encounter: Payer: Self-pay | Admitting: Family Medicine

## 2020-03-02 ENCOUNTER — Ambulatory Visit (INDEPENDENT_AMBULATORY_CARE_PROVIDER_SITE_OTHER): Payer: Medicare Other | Admitting: Family Medicine

## 2020-03-02 VITALS — BP 132/72 | HR 58 | Wt 152.4 lb

## 2020-03-02 DIAGNOSIS — M25461 Effusion, right knee: Secondary | ICD-10-CM | POA: Diagnosis not present

## 2020-03-02 DIAGNOSIS — I2510 Atherosclerotic heart disease of native coronary artery without angina pectoris: Secondary | ICD-10-CM

## 2020-03-02 DIAGNOSIS — R072 Precordial pain: Secondary | ICD-10-CM | POA: Diagnosis not present

## 2020-03-02 DIAGNOSIS — S134XXS Sprain of ligaments of cervical spine, sequela: Secondary | ICD-10-CM | POA: Diagnosis not present

## 2020-03-02 NOTE — Patient Instructions (Signed)
For your neck and back pain, we are going to get you started with physical therapy. You may continue to use tylenol (up to 4 g a day) and  blue emu cream to help with the pain. Keep staying active as you can.   Please call if you havent heard from PT by the end of next week.
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
## 2020-03-05 ENCOUNTER — Encounter: Payer: Self-pay | Admitting: Family Medicine

## 2020-03-05 DIAGNOSIS — S134XXS Sprain of ligaments of cervical spine, sequela: Secondary | ICD-10-CM | POA: Insufficient documentation

## 2020-03-05 DIAGNOSIS — M25461 Effusion, right knee: Secondary | ICD-10-CM

## 2020-03-05 HISTORY — DX: Effusion, right knee: M25.461

## 2020-03-05 HISTORY — DX: Sprain of ligaments of cervical spine, sequela: S13.4XXS
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
## 2020-03-05 NOTE — Assessment & Plan Note (Signed)
Hospital follow-up from 2/16.  Reassuringly ACS ruled out and following appropriately with Dr. Percival Spanish.  No further episodes since.  Will be due for repeat echo in 1 year or sooner if symptoms arise.

## 2020-03-08 ENCOUNTER — Encounter: Payer: Self-pay | Admitting: Physical Therapy

## 2020-03-08 ENCOUNTER — Ambulatory Visit: Payer: Medicare Other | Attending: Family Medicine | Admitting: Physical Therapy

## 2020-03-08 ENCOUNTER — Other Ambulatory Visit: Payer: Self-pay

## 2020-03-08 DIAGNOSIS — M6281 Muscle weakness (generalized): Secondary | ICD-10-CM | POA: Insufficient documentation

## 2020-03-08 DIAGNOSIS — M62830 Muscle spasm of back: Secondary | ICD-10-CM | POA: Diagnosis not present

## 2020-03-08 DIAGNOSIS — G8929 Other chronic pain: Secondary | ICD-10-CM | POA: Diagnosis not present

## 2020-03-08 DIAGNOSIS — R293 Abnormal posture: Secondary | ICD-10-CM | POA: Diagnosis not present

## 2020-03-08 DIAGNOSIS — M62838 Other muscle spasm: Secondary | ICD-10-CM | POA: Insufficient documentation

## 2020-03-08 DIAGNOSIS — M25561 Pain in right knee: Secondary | ICD-10-CM | POA: Diagnosis not present

## 2020-03-08 DIAGNOSIS — M542 Cervicalgia: Secondary | ICD-10-CM
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
## 2020-03-22 ENCOUNTER — Other Ambulatory Visit: Payer: Self-pay

## 2020-03-22 ENCOUNTER — Encounter: Payer: Self-pay | Admitting: Physical Therapy

## 2020-03-22 ENCOUNTER — Ambulatory Visit: Payer: Medicare Other | Admitting: Physical Therapy

## 2020-03-22 DIAGNOSIS — M25561 Pain in right knee: Secondary | ICD-10-CM

## 2020-03-22 DIAGNOSIS — M542 Cervicalgia: Secondary | ICD-10-CM | POA: Diagnosis not present

## 2020-03-22 DIAGNOSIS — M62830 Muscle spasm of back: Secondary | ICD-10-CM

## 2020-03-22 DIAGNOSIS — M62838 Other muscle spasm: Secondary | ICD-10-CM | POA: Diagnosis not present

## 2020-03-22 DIAGNOSIS — M6281 Muscle weakness (generalized): Secondary | ICD-10-CM | POA: Diagnosis not present

## 2020-03-22 DIAGNOSIS — G8929 Other chronic pain: Secondary | ICD-10-CM

## 2020-03-22 DIAGNOSIS — R293 Abnormal posture: Secondary | ICD-10-CM | POA: Diagnosis not present

## 2020-03-22 NOTE — Therapy (Signed)
[place], [place], [address] Phone: [phone]   Fax:  [phone]  Physical Therapy Treatment  Patient Details  Name: Heidi Wolf MRN: [id number] Date of Birth: 1941/05/17 Referring Provider (PT):  Kinnie Feil, MD  Darrelyn Hillock MD)   Encounter Date: 03/22/2020  PT End of Session - 03/22/20 1034    Visit Number  2    Number of Visits  13    Date for PT Re-Evaluation  04/19/20    Authorization Type  MCR: Kx at 15th visit    Progress Note Due on Visit  10    PT Start Time  1019    PT Stop Time  1109    PT Time Calculation (min)  50 min       Past Medical History:  Diagnosis Date  . Anemia   . Anxiety   . Arthritis    "right hip" (09/21/2014)  . CAD (coronary artery disease)    PCI 90% diagonal stenosis witha DES July 2017.  Nonobstructive disease elsewhere.   . Cataracts, both eyes   . Colon polyps    Hyperplastic 2003  . External hemorrhoid   . Fatigue 05/21/2017  . GERD (gastroesophageal reflux disease)   . Glaucoma   . Hyperlipidemia   . Hypertension   . Internal hemorrhoid   . Myocardial infarction ([place])   . Palpitations 09/03/2016   Holter Aug 2017- PACs, no arrhythmia.   . Personal history of colonic polyps 03/03/2012  . Type II diabetes mellitus ([place])    type 2    Past Surgical History:  Procedure Laterality Date  . ABDOMINAL HYSTERECTOMY    . APPENDECTOMY     "w/gallbladder"  . CARDIAC CATHETERIZATION  X 2  . CARDIAC CATHETERIZATION N/A 07/03/2016   Procedure: Left Heart Cath and Coronary Angiography;  Surgeon: Dixie Dials, MD;  Location: [facility];  Service: Cardiovascular;  Laterality: N/A;  . CARDIAC CATHETERIZATION N/A 07/03/2016   Procedure: Coronary Stent Intervention;  Surgeon: Jettie Booze, MD;  Location: [facility];  Service: Cardiovascular;  Laterality: N/A;  . CARDIAC CATHETERIZATION N/A 07/15/2016   Procedure: Left Heart Cath and  Coronary Angiography;  Surgeon: Dixie Dials, MD;  Location: [facility];  Service: Cardiovascular;  Laterality: N/A;  . CHOLECYSTECTOMY    . COLONOSCOPY    . CORONARY ANGIOPLASTY WITH STENT PLACEMENT  06/2016  . TUBAL LIGATION  1971    There were no vitals filed for this visit.  Subjective Assessment - 03/22/20 1104    Subjective  Pt arrives reporting pain in neck and upper  back and into arms. Also hand weakness, difficulty grasping.    Currently in Pain?  Yes    Pain Score  --   did not prodife number   Pain Location  Neck    Pain Orientation  Mid;Lower;Posterior    Pain Descriptors / Indicators  Aching;Throbbing;Sore    Aggravating Factors   reaching, household chores    Pain Relieving Factors  meds, topical creams    Multiple Pain Sites  Yes    Pain Score  --   did not provide number   Pain Location  Knee    Pain Orientation  Right    Pain Descriptors / Indicators  Aching    Aggravating Factors   prolonged standing walking    Pain Relieving Factors  topicl cream         [facility] PT Assessment -  03/22/20 0001      AROM   Right Knee Flexion  118                   [facility] Adult PT Treatment/Exercise - 03/22/20 0001      Neck Exercises: Seated   Neck Retraction  10 reps;5 secs   demonstration and verbal cues for proper form   Other Seated Exercise  scap retract x 10       Neck Exercises: Supine   Neck Retraction  10 reps    Neck Retraction Limitations  1 pillow    Other Supine Exercise  scap retraction x 10     Other Supine Exercise  cane pressups and pullovers       Knee/Hip Exercises: Stretches   Active Hamstring Stretch  10 seconds;3 reps    Passive Hamstring Stretch  --   seated     Knee/Hip Exercises: Supine   Heel Slides  Right;1 set;10 reps    Heel Slides Limitations  cues for full ROM    Straight Leg Raises  Right;10 reps      Manual Therapy   Manual therapy comments  cervical distraction and STW upper traps, Passive upper trap  stretching       Neck Exercises: Stretches   Upper Trapezius Stretch  2 reps;30 seconds;Right;Left    Levator Stretch  2 reps;10 seconds             PT Education - 03/22/20 1104    Education Details  HEP    Person(s) Educated  Patient    Methods  Explanation;Handout    Comprehension  Verbalized understanding       PT Short Term Goals - 03/08/20 1059      PT SHORT TERM GOAL #1   Title  pt to be I with inital HEP    Time  3    Period  Weeks    Status  New    Target Date  03/29/20      PT SHORT TERM GOAL #2   Title  verbalize/ demo efficient posture and lifting mechanics to reduce and prevent neck pain    Time  3    Period  Weeks    Status  New    Target Date  03/29/20      PT SHORT TERM GOAL #3   Title  report decreased neck/ upper trap stiffness and pain to </=4/10 to promote cervical mobility and improvement in condition    Time  3    Period  Weeks    Status  New    Target Date  03/29/20        PT Long Term Goals - 03/08/20 1101      PT LONG TERM GOAL #1   Title  pt to increase gross cervical ROM by >/= 8 degrees in all planes to and </= 2/10 pain for functional mobility required for ADLs    Time  6    Period  Weeks    Status  New    Target Date  04/19/20      PT LONG TERM GOAL #2   Title  pt to report improvement in sleeping for >/= 1 week to promote healing and QOL    Time  6    Period  Weeks    Status  New    Target Date  04/19/20      PT LONG TERM GOAL #3   Title  increase R knee flexion to >/=  120 degrees with no pain for functional ROM    Time  6    Period  Weeks    Status  New    Target Date  04/19/20      PT LONG TERM GOAL #4   Title  increase neck and knee FOTO to </= 44% limited to demo improvement in function    Time  6    Period  Weeks    Status  New      PT LONG TERM GOAL #5   Title  pt to be I with all HEp given as of last visit to maintain and progress current level of function    Time  6    Period  Weeks    Status  New     Target Date  04/19/20            Plan - 03/22/20 1107    Clinical Impression Statement  Pt reports complinace with HEP thus far. Continued pain with neck most limiting. Her daughter is disabled so Ms Canuto performs most of the household chores and notes increased pain and difficulty grasping/ making a fist. Reviewed HEP, her knee AROM has improved. She requires min cues. Able to progress with supine shoulder AAROM.    PT Next Visit Plan  review/ update HEP for cervical mobs, discuss DN for upper trap/ levator scapulae, stretching of upper trap/ lev scap, hamstrings, thoracic mobility,    PT Home Exercise Plan  [id number] - seated hamstring stretch, supine heel slides, upper trap stretch, chin tuck, added scap retract, supine cane pullovers       Patient will benefit from skilled therapeutic intervention in order to improve the following deficits and impairments:  Improper body mechanics, Increased muscle spasms, Decreased strength, Postural dysfunction, Pain, Decreased activity tolerance, Decreased endurance, Impaired UE functional use, Decreased range of motion  Visit Diagnosis: Cervicalgia  Chronic pain of right knee  Abnormal posture  Muscle weakness (generalized)  Muscle spasm of back  Other muscle spasm     Problem List Patient Active Problem List   Diagnosis Date Noted  . Whiplash injury, acute, sequela 03/05/2020  . Knee effusion, right 03/05/2020  . Educated about COVID-19 virus infection 02/23/2020  . Chest pain 02/14/2020  . Ankle pain 02/04/2020  . Sprain of left rotator cuff capsule 05/27/2018  . Thyroid nodule 04/09/2018  . GERD (gastroesophageal reflux disease)   . Cataracts, both eyes   . Arthritis   . Allergy   . Essential hypertension 09/03/2016  . Dyslipidemia 09/03/2016  . 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 CAD- S/P PCI July 2017 07/02/2016  . 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  Personal history of colonic polyps 03/03/2012  . Internal hemorrhoids without mention of complication [id number]    Dorene Ar, PTA 03/22/2020, 11:12 AM  [place], [place], [address] Phone: [phone]   Fax:  [phone]  Name: Tyjuana Sotolongo MRN: [id number] Date of Birth: [birth year]

## 2020-03-22 NOTE — Patient Instructions (Signed)
Exercises Seated Scapular Retraction - 2 x daily - 7 x weekly - 1 sets - 10 reps Supine Shoulder Flexion Extension AAROM with [place] - 2 x daily - 7 x weekly - 1 sets - 10 reps

## 2020-03-24 DIAGNOSIS — Z23 Encounter for immunization: Secondary | ICD-10-CM | POA: Diagnosis not present

## 2020-03-27 ENCOUNTER — Other Ambulatory Visit: Payer: Self-pay

## 2020-03-27 ENCOUNTER — Encounter: Payer: Self-pay | Admitting: Physical Therapy

## 2020-03-27 ENCOUNTER — Ambulatory Visit: Payer: Medicare Other | Admitting: Physical Therapy

## 2020-03-27 DIAGNOSIS — G8929 Other chronic pain: Secondary | ICD-10-CM | POA: Diagnosis not present

## 2020-03-27 DIAGNOSIS — M542 Cervicalgia: Secondary | ICD-10-CM | POA: Diagnosis not present

## 2020-03-27 DIAGNOSIS — R293 Abnormal posture: Secondary | ICD-10-CM

## 2020-03-27 DIAGNOSIS — M25561 Pain in right knee: Secondary | ICD-10-CM | POA: Diagnosis not present

## 2020-03-27 DIAGNOSIS — M6281 Muscle weakness (generalized): Secondary | ICD-10-CM

## 2020-03-27 DIAGNOSIS — M62838 Other muscle spasm: Secondary | ICD-10-CM | POA: Diagnosis not present

## 2020-03-27 NOTE — Therapy (Signed)
[place], [place], [address] Phone: [phone]   Fax:  [phone]  Physical Therapy Treatment  Patient Details  Name: Heidi Wolf MRN: [id number] Date of Birth: 12-Jul-1941 Referring Provider (PT):  Kinnie Feil, MD    Encounter Date: 03/27/2020  PT End of Session - 03/27/20 1016    Visit Number  3    Number of Visits  13    Date for PT Re-Evaluation  04/19/20    Authorization Type  MCR: Kx at 15th visit    Progress Note Due on Visit  10    PT Start Time  1017    PT Stop Time  1100    PT Time Calculation (min)  43 min    Activity Tolerance  Patient tolerated treatment well    Behavior During Therapy  [facility] for tasks assessed/performed       Past Medical History:  Diagnosis Date  . Anemia   . Anxiety   . Arthritis    "right hip" (09/21/2014)  . CAD (coronary artery disease)    PCI 90% diagonal stenosis witha DES July 2017.  Nonobstructive disease elsewhere.   . Cataracts, both eyes   . Colon polyps    Hyperplastic 2003  . External hemorrhoid   . Fatigue 05/21/2017  . GERD (gastroesophageal reflux disease)   . Glaucoma   . Hyperlipidemia   . Hypertension   . Internal hemorrhoid   . Myocardial infarction ([place])   . Palpitations 09/03/2016   Holter Aug 2017- PACs, no arrhythmia.   . Personal history of colonic polyps 03/03/2012  . Type II diabetes mellitus ([place])    type 2    Past Surgical History:  Procedure Laterality Date  . ABDOMINAL HYSTERECTOMY    . APPENDECTOMY     "w/gallbladder"  . CARDIAC CATHETERIZATION  X 2  . CARDIAC CATHETERIZATION N/A 07/03/2016   Procedure: Left Heart Cath and Coronary Angiography;  Surgeon: Dixie Dials, MD;  Location: [facility];  Service: Cardiovascular;  Laterality: N/A;  . CARDIAC CATHETERIZATION N/A 07/03/2016   Procedure: Coronary Stent Intervention;  Surgeon: Jettie Booze, MD;  Location: [facility];  Service:  Cardiovascular;  Laterality: N/A;  . CARDIAC CATHETERIZATION N/A 07/15/2016   Procedure: Left Heart Cath and Coronary Angiography;  Surgeon: Dixie Dials, MD;  Location: [facility];  Service: Cardiovascular;  Laterality: N/A;  . CHOLECYSTECTOMY    . COLONOSCOPY    . CORONARY ANGIOPLASTY WITH STENT PLACEMENT  06/2016  . TUBAL LIGATION  1971    There were no vitals filed for this visit.  Subjective Assessment - 03/27/20 1018    Subjective  Still having neck, upper back, and arm pain. She reports that she has been doing her HEPs, she believe that it has helped her pain. Still having swelling in her hands.    Limitations  Standing;Walking    How long can you sit comfortably?  unlimited    How long can you stand comfortably?  20- 30 min    How long can you walk comfortably?  20-30 min    Diagnostic tests  2/8 CT scan IMPRESSION:Brain atrophy and chronic white matter microvascular ischemicchanges. No acute intracranial abnormality by noncontrast CT. No acute cervical spine fracture or malalignment by CT. Cervicaldegenerative changes as above. Carotid atherosclerosis    Patient Stated Goals  to decrease pain in the neck and knee.    Currently in Pain?  Yes  Pain Score  7    Pt. took tylenol at 9   Pain Location  Neck    Pain Orientation  Mid;Lower;Proximal    Pain Descriptors / Indicators  Aching;Throbbing;Sore    Pain Score  5    Pain Location  Knee    Pain Orientation  Right    Pain Descriptors / Indicators  Aching    Pain Type  Chronic pain    Pain Onset  More than a month ago         [facility] PT Assessment - 03/27/20 0001      Assessment   Medical Diagnosis  Whiplash injury, acute, sequela S13.4XXS, Knee effusion, right M25.461    Referring Provider (PT)   Kinnie Feil, MD       AROM   AROM Assessment Site  Shoulder    Right/Left Shoulder  Left;Right    Right Shoulder Flexion  116 Degrees   AAROM in supine    Left Shoulder Flexion  125 Degrees   AAROM in supine     Right Knee Flexion  87                   [facility] Adult PT Treatment/Exercise - 03/27/20 0001      Neck Exercises: Theraband   Scapula Retraction  10 reps   Yellow     Neck Exercises: Seated   Cervical Rotation  Both;10 reps;Limitations    Cervical Rotation Limitations  With towel AAROM    Shoulder Flexion  Both;10 reps    Shoulder Flexion Limitations  W/ rod in supine      Neck Exercises: Supine   Neck Retraction  1 rep;10 reps    Neck Retraction Limitations  Towel      Knee/Hip Exercises: Stretches   Passive Hamstring Stretch  1 rep;Both;20 seconds      Knee/Hip Exercises: Supine   Heel Slides  AAROM;20 reps   MWM   Heel Slides Limitations  MWM w/ belt      Modalities   Modalities  Moist Heat      Moist Heat Therapy   Number Minutes Moist Heat  10 Minutes    Moist Heat Location  Cervical      Manual Therapy   Manual therapy comments  cervical distraction, suboccipital       Neck Exercises: Stretches   Upper Trapezius Stretch  2 reps;10 seconds             PT Education - 03/27/20 1128    Education Details  Patient was educated on new HEP, cold modalities for swelling in hand, and pain with exercises    Person(s) Educated  Patient    Methods  Explanation;Demonstration;Handout    Comprehension  Verbalized understanding;Returned demonstration       PT Short Term Goals - 03/08/20 1059      PT SHORT TERM GOAL #1   Title  pt to be I with inital HEP    Time  3    Period  Weeks    Status  New    Target Date  03/29/20      PT SHORT TERM GOAL #2   Title  verbalize/ demo efficient posture and lifting mechanics to reduce and prevent neck pain    Time  3    Period  Weeks    Status  New    Target Date  03/29/20      PT SHORT TERM GOAL #3   Title  report decreased neck/  upper trap stiffness and pain to </=4/10 to promote cervical mobility and improvement in condition    Time  3    Period  Weeks    Status  New    Target Date  03/29/20         PT Long Term Goals - 03/08/20 1101      PT LONG TERM GOAL #1   Title  pt to increase gross cervical ROM by >/= 8 degrees in all planes to and </= 2/10 pain for functional mobility required for ADLs    Time  6    Period  Weeks    Status  New    Target Date  04/19/20      PT LONG TERM GOAL #2   Title  pt to report improvement in sleeping for >/= 1 week to promote healing and QOL    Time  6    Period  Weeks    Status  New    Target Date  04/19/20      PT LONG TERM GOAL #3   Title  increase R knee flexion to >/= 120 degrees with no pain for functional ROM    Time  6    Period  Weeks    Status  New    Target Date  04/19/20      PT LONG TERM GOAL #4   Title  increase neck and knee FOTO to </= 44% limited to demo improvement in function    Time  6    Period  Weeks    Status  New      PT LONG TERM GOAL #5   Title  pt to be I with all HEp given as of last visit to maintain and progress current level of function    Time  6    Period  Weeks    Status  New    Target Date  04/19/20            Plan - 03/27/20 1110    Clinical Impression Statement  Pt. presents to the clinic with continued pain in the neck, shoulder, and knee region. She shows an increase in shoulder ROM, but constantly reports some pain with exercises. She responded favorably to manual techniques and was able to tolerate knee flexion mobilizations with movement. Stiffness limited her knee ROM. Patient required a great amount on cueing regarding her posture and AAROM. Patient would benefit from PT in order to further address ROM and strength deficits.    Personal Factors and Comorbidities  Comorbidity 3+;Age;Past/Current Experience    Comorbidities  hx of DM, anxiety, MI,    Examination-Activity Limitations  Lift;Stand;Squat;Locomotion Level    Stability/Clinical Decision Making  Unstable/Unpredictable    Clinical Decision Making  High    Rehab Potential  Good    PT Frequency  2x / week    PT Duration  2  weeks    PT Treatment/Interventions  ADLs/Self Care Home Management;Cryotherapy;Electrical Stimulation;Therapeutic exercise;Iontophoresis 4mg /ml Dexamethasone;Moist Heat;Ultrasound;Gait training;Therapeutic activities;Balance training;Neuromuscular re-education;Manual techniques;Passive range of motion;Dry needling;Taping    PT Next Visit Plan  review/ update HEP for cervical mobs, discuss DN for upper trap/ levator scapulae, stretching of upper trap/ lev scap, hamstrings, thoracic mobility,    PT Home Exercise Plan  [id number] - seated hamstring stretch, supine heel slides, upper trap stretch, chin tuck, added scap retract, supine cane pullovers    Consulted and Agree with Plan of Care  Patient       Patient will benefit  from skilled therapeutic intervention in order to improve the following deficits and impairments:  Improper body mechanics, Increased muscle spasms, Decreased strength, Postural dysfunction, Pain, Decreased activity tolerance, Decreased endurance, Impaired UE functional use, Decreased range of motion  Visit Diagnosis: Cervicalgia  Chronic pain of right knee  Abnormal posture  Muscle weakness (generalized)     Problem List Patient Active Problem List   Diagnosis Date Noted  . Whiplash injury, acute, sequela 03/05/2020  . Knee effusion, right 03/05/2020  . Educated about COVID-19 virus infection 02/23/2020  . Chest pain 02/14/2020  . Ankle pain 02/04/2020  . Sprain of left rotator cuff capsule 05/27/2018  . Thyroid nodule 04/09/2018  . GERD (gastroesophageal reflux disease)   . Cataracts, both eyes   . Arthritis   . Allergy   . Essential hypertension 09/03/2016  . Dyslipidemia 09/03/2016  . 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 CAD- S/P PCI July 2017 07/02/2016  . 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 Personal history of colonic polyps 03/03/2012  . Internal hemorrhoids without mention of complication  [id number]   Starr Lake PT, DPT, LAT, ATC  03/27/20  12:46 PM      [facility] [address] [place], [place], [address] Phone: [phone]   Fax:  [phone]  Name: Heidi Wolf MRN: [id number] Date of Birth: [birth year]

## 2020-03-29 ENCOUNTER — Ambulatory Visit: Payer: Medicare Other | Attending: Family Medicine | Admitting: Physical Therapy

## 2020-03-29 ENCOUNTER — Other Ambulatory Visit: Payer: Self-pay

## 2020-03-29 DIAGNOSIS — M62830 Muscle spasm of back: Secondary | ICD-10-CM | POA: Diagnosis not present

## 2020-03-29 DIAGNOSIS — R293 Abnormal posture: Secondary | ICD-10-CM | POA: Diagnosis not present

## 2020-03-29 DIAGNOSIS — M542 Cervicalgia: Secondary | ICD-10-CM | POA: Insufficient documentation

## 2020-03-29 DIAGNOSIS — G8929 Other chronic pain: Secondary | ICD-10-CM | POA: Diagnosis not present

## 2020-03-29 DIAGNOSIS — M6281 Muscle weakness (generalized): Secondary | ICD-10-CM | POA: Diagnosis not present

## 2020-03-29 DIAGNOSIS — M25561 Pain in right knee: Secondary | ICD-10-CM | POA: Insufficient documentation

## 2020-03-29 DIAGNOSIS — M62838 Other muscle spasm: Secondary | ICD-10-CM | POA: Diagnosis not present

## 2020-03-29 NOTE — Therapy (Signed)
[place], [place], [address] Phone: [phone]   Fax:  [phone]  Physical Therapy Treatment  Patient Details  Name: Heidi Wolf MRN: [id number] Date of Birth: 04/06/1941 Referring Provider (PT):  Kinnie Feil, MD    Encounter Date: 03/29/2020  PT End of Session - 03/29/20 0931    Visit Number  4    Number of Visits  13    Date for PT Re-Evaluation  04/19/20    Authorization Type  MCR: Kx at 15th visit    PT Start Time  0931    PT Stop Time  1020    PT Time Calculation (min)  49 min    Activity Tolerance  Patient tolerated treatment well    Behavior During Therapy  [facility] for tasks assessed/performed       Past Medical History:  Diagnosis Date  . Anemia   . Anxiety   . Arthritis    "right hip" (09/21/2014)  . CAD (coronary artery disease)    PCI 90% diagonal stenosis witha DES July 2017.  Nonobstructive disease elsewhere.   . Cataracts, both eyes   . Colon polyps    Hyperplastic 2003  . External hemorrhoid   . Fatigue 05/21/2017  . GERD (gastroesophageal reflux disease)   . Glaucoma   . Hyperlipidemia   . Hypertension   . Internal hemorrhoid   . Myocardial infarction ([place])   . Palpitations 09/03/2016   Holter Aug 2017- PACs, no arrhythmia.   . Personal history of colonic polyps 03/03/2012  . Type II diabetes mellitus ([place])    type 2    Past Surgical History:  Procedure Laterality Date  . ABDOMINAL HYSTERECTOMY    . APPENDECTOMY     "w/gallbladder"  . CARDIAC CATHETERIZATION  X 2  . CARDIAC CATHETERIZATION N/A 07/03/2016   Procedure: Left Heart Cath and Coronary Angiography;  Surgeon: Dixie Dials, MD;  Location: [place] [facility];  Service: Cardiovascular;  Laterality: N/A;  . CARDIAC CATHETERIZATION N/A 07/03/2016   Procedure: Coronary Stent Intervention;  Surgeon: Jettie Booze, MD;  Location: [place] [facility];  Service: Cardiovascular;  Laterality: N/A;  . CARDIAC  CATHETERIZATION N/A 07/15/2016   Procedure: Left Heart Cath and Coronary Angiography;  Surgeon: Dixie Dials, MD;  Location: [place] [facility];  Service: Cardiovascular;  Laterality: N/A;  . CHOLECYSTECTOMY    . COLONOSCOPY    . CORONARY ANGIOPLASTY WITH STENT PLACEMENT  06/2016  . TUBAL LIGATION  1971    There were no vitals filed for this visit.  Subjective Assessment - 03/29/20 0932    Subjective  " I am feeling better today, I still having some stiffness though"    Diagnostic tests  2/8 CT scan IMPRESSION:Brain atrophy and chronic white matter microvascular ischemicchanges. No acute intracranial abnormality by noncontrast CT. No acute cervical spine fracture or malalignment by CT. Cervicaldegenerative changes as above. Carotid atherosclerosis    Patient Stated Goals  to decrease pain in the neck and knee.    Currently in Pain?  Yes    Pain Score  5    I took some tylenol around 12 last night   Pain Orientation  Lower    Pain Onset  More than a month ago    Pain Frequency  Intermittent    Aggravating Factors   reaching forward    Pain Score  0   some stiffness this morning   Pain Location  Knee  Pain Orientation  Right    Pain Type  Chronic pain    Pain Onset  More than a month ago    Pain Frequency  Intermittent    Aggravating Factors   getting out of bed in the morning,         [facility] PT Assessment - 03/29/20 0001      Assessment   Medical Diagnosis  Whiplash injury, acute, sequela S13.4XXS, Knee effusion, right M25.461    Referring Provider (PT)   Kinnie Feil, MD                    [facility] Adult PT Treatment/Exercise - 03/29/20 0001      Neck Exercises: Theraband   Horizontal ABduction  10 reps;Red   x 2 sets in supine     Neck Exercises: Supine   Cervical Isometrics  Right lateral flexion;Left lateral flexion;10 reps;5 secs    Neck Retraction  10 reps;5 secs   chin tuck head lift   Neck Retraction Limitations  verbal/ tactile cues for proper  form    Other Supine Exercise  cane pressups and pullovers x 10 ea.      Knee/Hip Exercises: Aerobic   Nustep  L5 x 5 min UE/LE      Manual Therapy   Manual Therapy  Manual Traction;Joint mobilization    Manual therapy comments  MTPR along bil upper trap/ levator scapuale and sub-occipital release    Joint Mobilization  grade III cervical AP/PA mobs C4-C7     Manual Traction  gentle cervical traction      Neck Exercises: Stretches   Upper Trapezius Stretch  2 reps;30 seconds   with manual over pressure in supine   Levator Stretch  2 reps;10 seconds               PT Short Term Goals - 03/08/20 1059      PT SHORT TERM GOAL #1   Title  pt to be I with inital HEP    Time  3    Period  Weeks    Status  New    Target Date  03/29/20      PT SHORT TERM GOAL #2   Title  verbalize/ demo efficient posture and lifting mechanics to reduce and prevent neck pain    Time  3    Period  Weeks    Status  New    Target Date  03/29/20      PT SHORT TERM GOAL #3   Title  report decreased neck/ upper trap stiffness and pain to </=4/10 to promote cervical mobility and improvement in condition    Time  3    Period  Weeks    Status  New    Target Date  03/29/20        PT Long Term Goals - 03/08/20 1101      PT LONG TERM GOAL #1   Title  pt to increase gross cervical ROM by >/= 8 degrees in all planes to and </= 2/10 pain for functional mobility required for ADLs    Time  6    Period  Weeks    Status  New    Target Date  04/19/20      PT LONG TERM GOAL #2   Title  pt to report improvement in sleeping for >/= 1 week to promote healing and QOL    Time  6    Period  Weeks  Status  New    Target Date  04/19/20      PT LONG TERM GOAL #3   Title  increase R knee flexion to >/= 120 degrees with no pain for functional ROM    Time  6    Period  Weeks    Status  New    Target Date  04/19/20      PT LONG TERM GOAL #4   Title  increase neck and knee FOTO to </= 44% limited to  demo improvement in function    Time  6    Period  Weeks    Status  New      PT LONG TERM GOAL #5   Title  pt to be I with all HEp given as of last visit to maintain and progress current level of function    Time  6    Period  Weeks    Status  New    Target Date  04/19/20            Plan - 03/29/20 1017    Clinical Impression Statement  Heidi Wolf continues to make great progress with physical therapy, she reported no pain inthe knee today, focused on her shoulders and neck. She continues to respond well to MTPR techniques and cervical mobs, worked on scapular stabilizers which she reported relief of paoin but does require intermittent cues on proper form. continued MHP end of session to calm down sorenss.    PT Treatment/Interventions  ADLs/Self Care Home Management;Cryotherapy;Electrical Stimulation;Therapeutic exercise;Iontophoresis 4mg /ml Dexamethasone;Moist Heat;Ultrasound;Gait training;Therapeutic activities;Balance training;Neuromuscular re-education;Manual techniques;Passive range of motion;Dry needling;Taping    PT Next Visit Plan  review/ update HEP for cervical mobs, discuss DN for upper trap/ levator scapulae, stretching of upper trap/ lev scap, hamstrings, thoracic mobility,    PT Home Exercise Plan  [id number] - seated hamstring stretch, supine heel slides, upper trap stretch, chin tuck, added scap retract, supine cane pullovers    Consulted and Agree with Plan of Care  Patient       Patient will benefit from skilled therapeutic intervention in order to improve the following deficits and impairments:  Improper body mechanics, Increased muscle spasms, Decreased strength, Postural dysfunction, Pain, Decreased activity tolerance, Decreased endurance, Impaired UE functional use, Decreased range of motion  Visit Diagnosis: Cervicalgia  Chronic pain of right knee  Abnormal posture  Muscle weakness (generalized)     Problem List Patient Active Problem List   Diagnosis  Date Noted  . Whiplash injury, acute, sequela 03/05/2020  . Knee effusion, right 03/05/2020  . Educated about COVID-19 virus infection 02/23/2020  . Chest pain 02/14/2020  . Ankle pain 02/04/2020  . Sprain of left rotator cuff capsule 05/27/2018  . Thyroid nodule 04/09/2018  . GERD (gastroesophageal reflux disease)   . Cataracts, both eyes   . Arthritis   . Allergy   . Essential hypertension 09/03/2016  . Dyslipidemia 09/03/2016  . 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 CAD- S/P PCI July 2017 07/02/2016  . 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 Personal history of colonic polyps 03/03/2012  . Internal hemorrhoids without mention of complication [id number]   Starr Lake PT, DPT, LAT, ATC  03/29/20  10:20 AM      [facility] [address] [place], [place], [address] Phone: [phone]   Fax:  [phone]  Name: Heidi Wolf MRN: [id number] Date of Birth: 1941-06-27

## 2020-04-03 ENCOUNTER — Ambulatory Visit: Payer: Medicare Other | Admitting: Physical Therapy

## 2020-04-03 ENCOUNTER — Other Ambulatory Visit: Payer: Self-pay

## 2020-04-03 ENCOUNTER — Encounter: Payer: Self-pay | Admitting: Physical Therapy

## 2020-04-03 DIAGNOSIS — M25561 Pain in right knee: Secondary | ICD-10-CM | POA: Diagnosis not present

## 2020-04-03 DIAGNOSIS — M6281 Muscle weakness (generalized): Secondary | ICD-10-CM

## 2020-04-03 DIAGNOSIS — R293 Abnormal posture: Secondary | ICD-10-CM | POA: Diagnosis not present

## 2020-04-03 DIAGNOSIS — M542 Cervicalgia: Secondary | ICD-10-CM | POA: Diagnosis not present

## 2020-04-03 DIAGNOSIS — M62838 Other muscle spasm: Secondary | ICD-10-CM

## 2020-04-03 DIAGNOSIS — G8929 Other chronic pain: Secondary | ICD-10-CM

## 2020-04-03 DIAGNOSIS — M62830 Muscle spasm of back: Secondary | ICD-10-CM

## 2020-04-03 NOTE — Therapy (Addendum)
[place], [place], [address] Phone: [phone]   Fax:  [phone]  Physical Therapy Treatment  Patient Details  Name: Heidi Wolf MRN: [id number] Date of Birth: 29-Jan-1941 Referring Provider (PT):  Kinnie Feil, MD    Encounter Date: 04/03/2020  PT End of Session - 04/03/20 1102     Visit Number  5    Number of Visits  13    Date for PT Re-Evaluation  04/19/20    Authorization Type  MCR: Kx at 15th visit    PT Start Time  38    PT Stop Time  1150    PT Time Calculation (min)  48 min    Activity Tolerance  Patient tolerated treatment well    Behavior During Therapy  [facility] for tasks assessed/performed        Past Medical History:  Diagnosis Date   Anemia    Anxiety    Arthritis    "right hip" (09/21/2014)   CAD (coronary artery disease)    PCI 90% diagonal stenosis witha DES July 2017.  Nonobstructive disease elsewhere.    Cataracts, both eyes    Colon polyps    Hyperplastic 2003   External hemorrhoid    Fatigue 05/21/2017   GERD (gastroesophageal reflux disease)    Glaucoma    Hyperlipidemia    Hypertension    Internal hemorrhoid    Myocardial infarction [facility])    Palpitations 09/03/2016   Holter Aug 2017- PACs, no arrhythmia.    Personal history of colonic polyps 03/03/2012   Type II diabetes mellitus ([place])    type 2    Past Surgical History:  Procedure Laterality Date   ABDOMINAL HYSTERECTOMY     APPENDECTOMY     "w/gallbladder"   CARDIAC CATHETERIZATION  X 2   CARDIAC CATHETERIZATION N/A 07/03/2016   Procedure: Left Heart Cath and Coronary Angiography;  Surgeon: Dixie Dials, MD;  Location: [place] [facility];  Service: Cardiovascular;  Laterality: N/A;   CARDIAC CATHETERIZATION N/A 07/03/2016   Procedure: Coronary Stent Intervention;  Surgeon: Jettie Booze, MD;  Location: [place] [facility];  Service: Cardiovascular;  Laterality: N/A;   CARDIAC CATHETERIZATION N/A  07/15/2016   Procedure: Left Heart Cath and Coronary Angiography;  Surgeon: Dixie Dials, MD;  Location: [place] [facility];  Service: Cardiovascular;  Laterality: N/A;   CHOLECYSTECTOMY     COLONOSCOPY     CORONARY ANGIOPLASTY WITH STENT PLACEMENT  06/2016   TUBAL LIGATION  1971    There were no vitals filed for this visit.  Subjective Assessment - 04/03/20 1102     Subjective  My neck is doing better, but the right side is doing better than the left. The knee is flexing better, but a little swollen. I think the massages, and neck exercises helped. The heat felt good."    Pertinent History  hx of MI,    Limitations  Standing;Walking    Currently in Pain?  Yes    Pain Score  5     Pain Location  Neck    Pain Orientation  Lower    Pain Descriptors / Indicators  Aching;Throbbing;Sore    Pain Type  Chronic pain    Pain Onset  More than a month ago    Pain Frequency  Intermittent    Pain Score  0    Pain Location  Knee    Pain Orientation  Right    Pain Descriptors / Indicators  Aching    Pain Type  Chronic pain    Pain Onset  More than a month ago    Pain Frequency  Intermittent                                       [facility] Adult PT Treatment/Exercise - 04/03/20 0001       Neck Exercises: Theraband   Scapula Retraction  20 reps    Other Theraband Exercises  Scap Retraction on a dina-disc       Neck Exercises: Seated   Shoulder Shrugs  20 reps    Shoulder Rolls  20 reps      Neck Exercises: Supine   Neck Retraction  20 reps    Capital Flexion  20 reps    Cervical Rotation  20 reps      Knee/Hip Exercises: Stretches   Active Hamstring Stretch  Right;Left;Both;1 rep;10 seconds      Knee/Hip Exercises: Aerobic   Nustep  L5 x 5 min UE/LE      Modalities   Modalities  Moist Heat      Moist Heat Therapy   Number Minutes Moist Heat  10 Minutes    Moist Heat Location  Cervical      Manual Therapy   Manual Therapy  Manual Traction;Soft tissue  mobilization;Edema management;Muscle Energy Technique    Manual therapy comments  MTPR along bil upper trap    Edema Management  Effleurage on knee     Soft tissue mobilization  Upper Trap massage       Neck Exercises: Stretches   Upper Trapezius Stretch  2 reps;10 seconds                       PT Short Term Goals - 03/08/20 1059       PT SHORT TERM GOAL #1   Title  pt to be I with inital HEP    Time  3    Period  Weeks    Status  New    Target Date  03/29/20      PT SHORT TERM GOAL #2   Title  verbalize/ demo efficient posture and lifting mechanics to reduce and prevent neck pain    Time  3    Period  Weeks    Status  New    Target Date  03/29/20      PT SHORT TERM GOAL #3   Title  report decreased neck/ upper trap stiffness and pain to </=4/10 to promote cervical mobility and improvement in condition    Time  3    Period  Weeks    Status  New    Target Date  03/29/20          PT Long Term Goals - 03/08/20 1101       PT LONG TERM GOAL #1   Title  pt to increase gross cervical ROM by >/= 8 degrees in all planes to and </= 2/10 pain for functional mobility required for ADLs    Time  6    Period  Weeks    Status  New    Target Date  04/19/20      PT LONG TERM GOAL #2   Title  pt to report improvement in sleeping for >/= 1 week to promote healing and QOL    Time  6    Period  Weeks    Status  New    Target Date  04/19/20      PT LONG TERM GOAL #3   Title  increase R knee flexion to >/= 120 degrees with no pain for functional ROM    Time  6    Period  Weeks    Status  New    Target Date  04/19/20      PT LONG TERM GOAL #4   Title  increase neck and knee FOTO to </= 44% limited to demo improvement in function    Time  6    Period  Weeks    Status  New      PT LONG TERM GOAL #5   Title  pt to be I with all HEp given as of last visit to maintain and progress current level of function    Time  6    Period  Weeks    Status  New    Target Date   04/19/20                  Plan - 04/03/20 1146     Clinical Impression Statement  Patient continues to report a decrease in pain in both her cervical and knee region. She had no knee pain, and reported an increase in ROM. She still reports cervical pain, but stated that it is getting better. Patient reported a decrease in pain after the session and demonstrated an increase in cervical ROM. She required minimal cueing and was able to tolerate the PT session well.    Personal Factors and Comorbidities  Comorbidity 3+;Age;Past/Current Experience    Comorbidities  hx of DM, anxiety, MI,    Examination-Activity Limitations  Lift;Stand;Squat;Locomotion Level    Examination-Participation Restrictions  Meal Prep;Cleaning    Stability/Clinical Decision Making  Unstable/Unpredictable    Clinical Decision Making  High    Rehab Potential  Good    PT Frequency  2x / week    PT Treatment/Interventions  ADLs/Self Care Home Management;Cryotherapy;Electrical Stimulation;Therapeutic exercise;Iontophoresis 4mg /ml Dexamethasone;Moist Heat;Ultrasound;Gait training;Therapeutic activities;Balance training;Neuromuscular re-education;Manual techniques;Passive range of motion;Dry needling;Taping    PT Next Visit Plan  review/ update HEP for cervical mobs, discuss DN for upper trap/ levator scapulae, stretching of upper trap/ lev scap, hamstrings, thoracic mobility,    PT Home Exercise Plan  [id number] - seated hamstring stretch, supine heel slides, upper trap stretch, chin tuck, added scap retract, supine cane pullovers    Consulted and Agree with Plan of Care  Patient        Patient will benefit from skilled therapeutic intervention in order to improve the following deficits and impairments:  Improper body mechanics, Increased muscle spasms, Decreased strength, Postural dysfunction, Pain, Decreased activity tolerance, Decreased endurance, Impaired UE functional use, Decreased range of motion  Visit  Diagnosis: Cervicalgia  Chronic pain of right knee  Abnormal posture  Muscle weakness (generalized)  Muscle spasm of back  Other muscle spasm      Problem List Patient Active Problem List   Diagnosis Date Noted   Whiplash injury, acute, sequela 03/05/2020   Knee effusion, right 03/05/2020   Educated about COVID-19 virus infection 02/23/2020   Chest pain 02/14/2020   Ankle pain 02/04/2020   Sprain of left rotator cuff capsule 05/27/2018   Thyroid nodule 04/09/2018   GERD (gastroesophageal reflux disease)    Cataracts, both eyes    Arthritis    Allergy    Essential hypertension 09/03/2016   Dyslipidemia 09/03/2016   Type  2 diabetes mellitus with circulatory disorder ([place]) 09/03/2016   CAD- S/P PCI July 2017 07/02/2016   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 [place], [place], [address] Phone: [phone]   Fax:  [phone]  Name: Heidi Wolf MRN: [id number] Date of Birth: 02/14/1941

## 2020-04-05 ENCOUNTER — Other Ambulatory Visit: Payer: Self-pay

## 2020-04-05 ENCOUNTER — Ambulatory Visit: Payer: Medicare Other | Admitting: Physical Therapy

## 2020-04-05 ENCOUNTER — Encounter: Payer: Self-pay | Admitting: Physical Therapy

## 2020-04-05 DIAGNOSIS — G8929 Other chronic pain: Secondary | ICD-10-CM

## 2020-04-05 DIAGNOSIS — R293 Abnormal posture: Secondary | ICD-10-CM

## 2020-04-05 DIAGNOSIS — M542 Cervicalgia: Secondary | ICD-10-CM | POA: Diagnosis not present

## 2020-04-05 DIAGNOSIS — M62830 Muscle spasm of back: Secondary | ICD-10-CM | POA: Diagnosis not present

## 2020-04-05 DIAGNOSIS — M25561 Pain in right knee: Secondary | ICD-10-CM | POA: Diagnosis not present

## 2020-04-05 DIAGNOSIS — M6281 Muscle weakness (generalized): Secondary | ICD-10-CM | POA: Diagnosis not present

## 2020-04-05 DIAGNOSIS — M62838 Other muscle spasm: Secondary | ICD-10-CM
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
## 2020-04-09 ENCOUNTER — Ambulatory Visit (INDEPENDENT_AMBULATORY_CARE_PROVIDER_SITE_OTHER): Payer: Medicare Other | Admitting: Family Medicine

## 2020-04-09 ENCOUNTER — Other Ambulatory Visit: Payer: Self-pay

## 2020-04-09 ENCOUNTER — Other Ambulatory Visit: Payer: Self-pay | Admitting: *Deleted

## 2020-04-09 VITALS — BP 135/65 | HR 59 | Wt 153.0 lb

## 2020-04-09 DIAGNOSIS — M159 Polyosteoarthritis, unspecified: Secondary | ICD-10-CM

## 2020-04-09 DIAGNOSIS — I2510 Atherosclerotic heart disease of native coronary artery without angina pectoris: Secondary | ICD-10-CM

## 2020-04-09 DIAGNOSIS — M25461 Effusion, right knee: Secondary | ICD-10-CM | POA: Diagnosis not present

## 2020-04-09 DIAGNOSIS — S134XXS Sprain of ligaments of cervical spine, sequela: Secondary | ICD-10-CM | POA: Diagnosis not present

## 2020-04-09 DIAGNOSIS — M170 Bilateral primary osteoarthritis of knee: Secondary | ICD-10-CM

## 2020-04-09 DIAGNOSIS — I10 Essential (primary) hypertension: Secondary | ICD-10-CM

## 2020-04-09 MED ORDER — PANTOPRAZOLE SODIUM 40 MG PO TBEC: 40.0000 mg | DELAYED_RELEASE_TABLET | Freq: Every day | ORAL | 1 refills | Status: DC

## 2020-04-09 MED ORDER — AMLODIPINE BESYLATE 10 MG PO TABS: ORAL_TABLET | ORAL | 3 refills | Status: DC

## 2020-04-09 MED ORDER — NITROGLYCERIN 0.4 MG SL SUBL: 0.4000 mg | SUBLINGUAL_TABLET | SUBLINGUAL | 3 refills | Status: DC | PRN

## 2020-04-09 MED ORDER — METHYLPREDNISOLONE ACETATE 40 MG/ML IJ SUSP
40.0000 mg | Freq: Once | INTRAMUSCULAR | Status: AC
  Administered 2020-04-09: 40 mg via INTRAMUSCULAR
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
## 2020-04-09 NOTE — Patient Instructions (Signed)
It is wonderful to see you today!  We injected her knee today, hopefully that should help with the pain.  You can also continue using your Tylenol and or blue emu cream or Aspercreme on your finger joints/shoulder.  I encourage you to continue doing the exercises you got for the physical therapist and also working on straight leg raises to strengthen the front part of your legs.  I will ask around about a chiropractor and get back to within the next week with any recommendations.

## 2020-04-10 ENCOUNTER — Telehealth: Payer: Self-pay | Admitting: Cardiology

## 2020-04-10 NOTE — Telephone Encounter (Signed)
Patient was calling to check on the status of her   nitroGLYCERIN (NITROSTAT) 0.4 MG SL tablet   She said she went to the pharmacy and it wasn't ready yet.

## 2020-04-13 ENCOUNTER — Encounter: Payer: Self-pay | Admitting: Family Medicine

## 2020-04-13 DIAGNOSIS — M159 Polyosteoarthritis, unspecified: Secondary | ICD-10-CM | POA: Insufficient documentation
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
## 2020-04-13 NOTE — Assessment & Plan Note (Signed)
Predominantly 2nd/3rd digits bilaterally within PIP/MCP joints.  Clinical presentation suggestive of aggravated osteoarthritis.  Recommended continued conservative care with Tylenol, Aspercreme.  Encouraged her to stay active and mobilize her joints frequently.
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
## 2020-04-17 ENCOUNTER — Telehealth: Payer: Self-pay | Admitting: Family Medicine
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
## 2020-04-18 DIAGNOSIS — Z03818 Encounter for observation for suspected exposure to other biological agents ruled out: Secondary | ICD-10-CM | POA: Diagnosis not present

## 2020-04-18 DIAGNOSIS — Z20828 Contact with and (suspected) exposure to other viral communicable diseases: Secondary | ICD-10-CM | POA: Diagnosis not present

## 2020-04-19 NOTE — Telephone Encounter (Signed)
Thank you for letting me know! Its no rush on her part, I agree with her being cautious.   [name], DO

## 2020-05-10 ENCOUNTER — Ambulatory Visit
Admission: RE | Admit: 2020-05-10 | Discharge: 2020-05-10 | Disposition: A | Discharge status: Home / Self Care | Payer: Medicare Other | Source: Ambulatory Visit | Attending: Family Medicine | Admitting: Family Medicine

## 2020-05-10 ENCOUNTER — Other Ambulatory Visit: Payer: Self-pay | Admitting: Family Medicine

## 2020-05-10 DIAGNOSIS — M170 Bilateral primary osteoarthritis of knee: Secondary | ICD-10-CM

## 2020-05-10 DIAGNOSIS — M1711 Unilateral primary osteoarthritis, right knee: Secondary | ICD-10-CM | POA: Diagnosis not present

## 2020-05-10 DIAGNOSIS — M25562 Pain in left knee: Secondary | ICD-10-CM | POA: Diagnosis not present

## 2020-05-10 DIAGNOSIS — M25461 Effusion, right knee: Secondary | ICD-10-CM

## 2020-05-25 ENCOUNTER — Telehealth: Payer: Self-pay | Admitting: *Deleted

## 2020-05-25 NOTE — Telephone Encounter (Signed)
-----   Message from [name], DO sent at 05/24/2020 11:04 PM EDT ----- Please let Heidi Wolf know that there was no evidence of fracture or dislocation on her knee xrays, which is great. Does show evidence of arthritis and the small fluid collection on her right knee. Hopefully she has continued to improve. Thank you!

## 2020-05-25 NOTE — Telephone Encounter (Signed)
Pt informed. Kayleigh Broadwell, CMA  

## 2020-05-29 DIAGNOSIS — H401131 Primary open-angle glaucoma, bilateral, mild stage: Secondary | ICD-10-CM | POA: Diagnosis not present

## 2020-06-20 ENCOUNTER — Encounter: Payer: Self-pay | Admitting: Family Medicine

## 2020-06-20 ENCOUNTER — Other Ambulatory Visit: Payer: Self-pay

## 2020-06-20 ENCOUNTER — Ambulatory Visit (INDEPENDENT_AMBULATORY_CARE_PROVIDER_SITE_OTHER): Payer: Medicare Other | Admitting: Family Medicine

## 2020-06-20 VITALS — BP 145/70 | HR 51 | Wt 150.0 lb

## 2020-06-20 DIAGNOSIS — M25461 Effusion, right knee: Secondary | ICD-10-CM

## 2020-06-20 DIAGNOSIS — E1159 Type 2 diabetes mellitus with other circulatory complications: Secondary | ICD-10-CM

## 2020-06-20 DIAGNOSIS — S134XXS Sprain of ligaments of cervical spine, sequela: Secondary | ICD-10-CM

## 2020-06-20 DIAGNOSIS — I2510 Atherosclerotic heart disease of native coronary artery without angina pectoris: Secondary | ICD-10-CM

## 2020-06-20 NOTE — Patient Instructions (Addendum)
Wonderful to see you today as always.  I am so glad that your shoulders and knees are doing much better.  You can continue taking Tylenol as needed with your pain cream.  Continue doing the exercises you learned through physical therapy.  You could also consider massage therapy, however this is usually not covered by insurance.
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
## 2020-06-23 ENCOUNTER — Encounter: Payer: Self-pay | Admitting: Family Medicine

## 2020-06-23 NOTE — Assessment & Plan Note (Addendum)
Substantially improved with unremarkable physical exam today.  Recommended continued home physical therapy exercises and Tylenol/blue emu cream as needed.  Believe massage therapy will be a welcome addition to her regimen.

## 2020-06-23 NOTE — Assessment & Plan Note (Addendum)
Will recheck A1c on next visit.  Diet controlled, her A1c has remained in prediabetic range for the past 2 years.
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
## 2020-08-07 ENCOUNTER — Other Ambulatory Visit: Payer: Self-pay

## 2020-08-07 ENCOUNTER — Ambulatory Visit (INDEPENDENT_AMBULATORY_CARE_PROVIDER_SITE_OTHER): Payer: Medicare Other | Admitting: Family Medicine

## 2020-08-07 ENCOUNTER — Telehealth: Payer: Self-pay

## 2020-08-07 ENCOUNTER — Encounter: Payer: Self-pay | Admitting: Family Medicine

## 2020-08-07 VITALS — BP 130/78 | HR 52 | Ht 64.0 in | Wt 151.4 lb

## 2020-08-07 DIAGNOSIS — I2510 Atherosclerotic heart disease of native coronary artery without angina pectoris: Secondary | ICD-10-CM

## 2020-08-07 DIAGNOSIS — M5412 Radiculopathy, cervical region: Secondary | ICD-10-CM

## 2020-08-07 DIAGNOSIS — S134XXS Sprain of ligaments of cervical spine, sequela: Secondary | ICD-10-CM | POA: Diagnosis not present

## 2020-08-07 DIAGNOSIS — E1159 Type 2 diabetes mellitus with other circulatory complications: Secondary | ICD-10-CM | POA: Diagnosis not present

## 2020-08-07 LAB — POCT GLYCOSYLATED HEMOGLOBIN (HGB A1C): HbA1c, POC (controlled diabetic range): 5.8 % (ref 0.0–7.0)

## 2020-08-07 MED ORDER — GABAPENTIN 100 MG PO CAPS: 100.0000 mg | ORAL_CAPSULE | Freq: Three times a day (TID) | ORAL | 0 refills | Status: DC

## 2020-08-07 NOTE — Telephone Encounter (Signed)
Thank you so much for checking in on this!  I have placed the referral.  [name], DO

## 2020-08-07 NOTE — Telephone Encounter (Signed)
Spoke with [place] at [facility] and he said that they do take both medicare and medicaid.  Patient was explained that depending on what is covered by medicare that her secondary insurance would kick in.  Since she has regular medicaid this shouldn't be a problem of coverage.  Once provider places referral, I will send it on to [facility].  Patient called and I explained all of this.  No further questions at this time.  Treasure Ochs,CMA

## 2020-08-07 NOTE — Telephone Encounter (Signed)
Sent a message to sports medicine office manager to check on this.  I haven't processed the referral yet, so I will make sure to get the information before I send it on.  [facility]

## 2020-08-07 NOTE — Telephone Encounter (Signed)
Patient calls nurse line regarding referral for sports medicine. Patient states that she called [facility] and that they do not accept her insurance. Patient would like Sports Medicine referral placed at a different office that accepts her insurance.   To PCP and Jazmin.   Talbot Grumbling, RN
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
## 2020-08-07 NOTE — Patient Instructions (Signed)
It was wonderful to see you today as always.  I encourage you to continue doing the exercises from your physical therapist for your neck.  You can continue to use your Tylenol 650 mg every 4-6 hours.  You can also continue to use your Aspercreme several times a day and heat on your neck 20 minutes at a time several times.  If you have any extremity weakness, prolonged numbness, or any change in her bowel or bladder function please let me know right away or go to the ED.   I have also placed a referral to sports medicine for an additional evaluation.  You can call their office at [phone] to possibly schedule an appointment sooner than being reached out to.  Their office is just around the corner at [address].

## 2020-08-09 ENCOUNTER — Ambulatory Visit (INDEPENDENT_AMBULATORY_CARE_PROVIDER_SITE_OTHER): Payer: Medicare Other | Admitting: Family Medicine

## 2020-08-09 ENCOUNTER — Other Ambulatory Visit: Payer: Self-pay

## 2020-08-09 VITALS — BP 124/68 | Ht 64.0 in | Wt 151.0 lb

## 2020-08-09 DIAGNOSIS — M25551 Pain in right hip: Secondary | ICD-10-CM | POA: Diagnosis not present

## 2020-08-09 DIAGNOSIS — M5412 Radiculopathy, cervical region: Secondary | ICD-10-CM

## 2020-08-09 DIAGNOSIS — I2510 Atherosclerotic heart disease of native coronary artery without angina pectoris: Secondary | ICD-10-CM
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
## 2020-08-10 ENCOUNTER — Encounter: Payer: Self-pay | Admitting: Family Medicine

## 2020-08-10 DIAGNOSIS — M5412 Radiculopathy, cervical region: Secondary | ICD-10-CM | POA: Insufficient documentation

## 2020-08-10 NOTE — Assessment & Plan Note (Signed)
A1c 5.8 today.  Longstanding diet controlled, next A1c in 6 months.
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
## 2020-08-22 ENCOUNTER — Ambulatory Visit (INDEPENDENT_AMBULATORY_CARE_PROVIDER_SITE_OTHER): Payer: Medicare Other | Admitting: Physician Assistant

## 2020-08-22 ENCOUNTER — Other Ambulatory Visit: Payer: Self-pay

## 2020-08-22 ENCOUNTER — Encounter: Payer: Self-pay | Admitting: Physician Assistant

## 2020-08-22 VITALS — BP 136/62 | HR 53 | Ht 64.0 in | Wt 153.0 lb

## 2020-08-22 DIAGNOSIS — E785 Hyperlipidemia, unspecified: Secondary | ICD-10-CM

## 2020-08-22 DIAGNOSIS — I2510 Atherosclerotic heart disease of native coronary artery without angina pectoris: Secondary | ICD-10-CM

## 2020-08-22 DIAGNOSIS — E119 Type 2 diabetes mellitus without complications: Secondary | ICD-10-CM | POA: Diagnosis not present

## 2020-08-22 DIAGNOSIS — I10 Essential (primary) hypertension: Secondary | ICD-10-CM | POA: Diagnosis not present

## 2020-08-22 DIAGNOSIS — I421 Obstructive hypertrophic cardiomyopathy: Secondary | ICD-10-CM | POA: Diagnosis not present
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
## 2020-08-22 NOTE — Patient Instructions (Addendum)
Medication Instructions:  No Changes In Medications at this time.   *If you need a refill on your cardiac medications before your next appointment, please call your pharmacy*   Lab Work: None Ordered At This Time.   If you have labs (blood work) drawn today and your tests are completely normal, you will receive your results only by: Marland Kitchen MyChart Message (if you have MyChart) OR . A paper copy in the mail If you have any lab test that is abnormal or we need to change your treatment, we will call you to review the results.   Testing/Procedures:  In 6 Months  Your physician has requested that you have an echocardiogram. Echocardiography is a painless test that uses sound waves to create images of your heart. It provides your doctor with information about the size and shape of your heart and how well your heart's chambers and valves are working. This procedure takes approximately one hour. There are no restrictions for this procedure.    Follow-Up: At [facility], you and your health needs are our priority.  As part of our continuing mission to provide you with exceptional heart care, we have created designated Provider Care Teams.  These Care Teams include your primary Cardiologist (physician) and Advanced Practice Providers (APPs -  Physician Assistants and Nurse Practitioners) who all work together to provide you with the care you need, when you need it.  We recommend signing up for the patient portal called "MyChart".  Sign up information is provided on this After Visit Summary.  MyChart is used to connect with patients for Virtual Visits (Telemedicine).  Patients are able to view lab/test results, encounter notes, upcoming appointments, etc.  Non-urgent messages can be sent to your provider as well.   To learn more about what you can do with MyChart, go to [email or web address].    Your next appointment:   6 month(s) after Echo is completed   The format for your next appointment:    In Person  Provider:   Minus Breeding, MD   Other Instructions

## 2020-08-30 DIAGNOSIS — H401131 Primary open-angle glaucoma, bilateral, mild stage: Secondary | ICD-10-CM | POA: Diagnosis not present

## 2020-08-31 ENCOUNTER — Other Ambulatory Visit: Payer: Self-pay

## 2020-08-31 ENCOUNTER — Ambulatory Visit
Admission: RE | Admit: 2020-08-31 | Discharge: 2020-08-31 | Disposition: A | Discharge status: Home / Self Care | Payer: Medicare Other | Source: Ambulatory Visit | Attending: Family Medicine | Admitting: Family Medicine

## 2020-08-31 DIAGNOSIS — M50123 Cervical disc disorder at C6-C7 level with radiculopathy: Secondary | ICD-10-CM | POA: Diagnosis not present

## 2020-08-31 DIAGNOSIS — M5013 Cervical disc disorder with radiculopathy, cervicothoracic region: Secondary | ICD-10-CM | POA: Diagnosis not present

## 2020-08-31 DIAGNOSIS — M4722 Other spondylosis with radiculopathy, cervical region: Secondary | ICD-10-CM | POA: Diagnosis not present

## 2020-08-31 DIAGNOSIS — M5412 Radiculopathy, cervical region: Secondary | ICD-10-CM

## 2020-08-31 DIAGNOSIS — M4802 Spinal stenosis, cervical region: Secondary | ICD-10-CM | POA: Diagnosis not present

## 2020-09-12 ENCOUNTER — Telehealth: Payer: Self-pay | Admitting: Family Medicine

## 2020-09-12 NOTE — Telephone Encounter (Signed)
Called patient to discuss MRI results. 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 Given her ongoing radicular symptoms, bilateral hand weakness, will place referral to PM&R as discussed in clinic to consider possibility of interventional therapy. Patient is agreeable with plan.

## 2020-09-13 ENCOUNTER — Other Ambulatory Visit: Payer: Self-pay

## 2020-09-13 DIAGNOSIS — M5412 Radiculopathy, cervical region: Secondary | ICD-10-CM

## 2020-09-18 ENCOUNTER — Encounter: Payer: Self-pay | Admitting: Physical Medicine & Rehabilitation

## 2020-09-27 ENCOUNTER — Other Ambulatory Visit: Payer: Self-pay | Admitting: Cardiology

## 2020-10-16 ENCOUNTER — Ambulatory Visit: Payer: Medicare Other | Admitting: Physical Medicine & Rehabilitation

## 2020-10-18 ENCOUNTER — Encounter: Payer: Medicare Other | Attending: Physical Medicine & Rehabilitation | Admitting: Physical Medicine & Rehabilitation

## 2020-10-18 ENCOUNTER — Other Ambulatory Visit: Payer: Self-pay | Admitting: Cardiology

## 2020-10-18 ENCOUNTER — Other Ambulatory Visit: Payer: Self-pay

## 2020-10-18 ENCOUNTER — Encounter: Payer: Self-pay | Admitting: Physical Medicine & Rehabilitation

## 2020-10-18 VITALS — BP 155/73 | HR 54 | Temp 98.9°F | Ht 64.0 in | Wt 155.0 lb

## 2020-10-18 DIAGNOSIS — M545 Low back pain, unspecified: Secondary | ICD-10-CM

## 2020-10-18 DIAGNOSIS — M5090 Cervical disc disorder, unspecified, unspecified cervical region: Secondary | ICD-10-CM | POA: Insufficient documentation

## 2020-10-18 DIAGNOSIS — G8929 Other chronic pain: Secondary | ICD-10-CM | POA: Diagnosis not present

## 2020-10-18 DIAGNOSIS — M1711 Unilateral primary osteoarthritis, right knee: Secondary | ICD-10-CM | POA: Diagnosis not present

## 2020-10-18 DIAGNOSIS — I2510 Atherosclerotic heart disease of native coronary artery without angina pectoris: Secondary | ICD-10-CM

## 2020-10-18 NOTE — Patient Instructions (Addendum)
Voltaren gel right knee 3 times a day   Nerve test for Left hand numbness in 2-3 weeks  Xrays for Lumbar spine pain   Will ask PT to work with back as well as neck and RIght knee
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
## 2020-10-22 ENCOUNTER — Ambulatory Visit
Admission: RE | Admit: 2020-10-22 | Discharge: 2020-10-22 | Disposition: A | Discharge status: Home / Self Care | Payer: Medicare Other | Source: Ambulatory Visit | Attending: Physical Medicine & Rehabilitation | Admitting: Physical Medicine & Rehabilitation

## 2020-10-22 DIAGNOSIS — G8929 Other chronic pain: Secondary | ICD-10-CM

## 2020-10-22 DIAGNOSIS — M545 Low back pain, unspecified: Secondary | ICD-10-CM | POA: Diagnosis not present

## 2020-10-24 ENCOUNTER — Telehealth: Payer: Self-pay | Admitting: *Deleted

## 2020-10-24 NOTE — Telephone Encounter (Signed)
-----   Message from Charlett Blake, MD sent at 10/23/2020  7:00 AM EDT ----- Results consistent with arthritic changes in the lumbar spine.  If PT not helpful will proceed with lumbar medial branch blocks

## 2020-10-24 NOTE — Telephone Encounter (Signed)
Notified Heidi Wolf.Marland Kitchen

## 2020-10-29 ENCOUNTER — Telehealth: Payer: Self-pay

## 2020-10-29 ENCOUNTER — Other Ambulatory Visit: Payer: Self-pay | Admitting: Family Medicine

## 2020-10-29 DIAGNOSIS — K219 Gastro-esophageal reflux disease without esophagitis: Secondary | ICD-10-CM

## 2020-10-29 MED ORDER — PANTOPRAZOLE SODIUM 40 MG PO TBEC: 40.0000 mg | DELAYED_RELEASE_TABLET | Freq: Every day | ORAL | 1 refills | Status: DC | PRN

## 2020-10-29 NOTE — Telephone Encounter (Signed)
Patient calls nurse line requesting refill of pantoprazole. It seems that refill request has been being sent to Cardiology and has been denied.   Please advise if patient should still be on this medication.   To PCP  Talbot Grumbling, RN

## 2020-10-29 NOTE — Telephone Encounter (Signed)
Sent in refill. She uses this on an intermittent PRN basis.   [name], DO

## 2020-10-30 ENCOUNTER — Other Ambulatory Visit: Payer: Self-pay | Admitting: *Deleted

## 2020-10-30 DIAGNOSIS — K219 Gastro-esophageal reflux disease without esophagitis: Secondary | ICD-10-CM

## 2020-11-05 ENCOUNTER — Other Ambulatory Visit: Payer: Self-pay | Admitting: Family Medicine

## 2020-11-05 DIAGNOSIS — Z1231 Encounter for screening mammogram for malignant neoplasm of breast: Secondary | ICD-10-CM

## 2020-11-07 ENCOUNTER — Encounter: Payer: Self-pay | Admitting: Family Medicine

## 2020-11-07 ENCOUNTER — Other Ambulatory Visit: Payer: Self-pay

## 2020-11-07 ENCOUNTER — Ambulatory Visit (INDEPENDENT_AMBULATORY_CARE_PROVIDER_SITE_OTHER): Payer: Medicare Other | Admitting: Family Medicine

## 2020-11-07 VITALS — BP 122/70 | HR 57 | Ht 64.0 in | Wt 155.2 lb

## 2020-11-07 DIAGNOSIS — E1159 Type 2 diabetes mellitus with other circulatory complications: Secondary | ICD-10-CM | POA: Diagnosis not present

## 2020-11-07 DIAGNOSIS — E785 Hyperlipidemia, unspecified: Secondary | ICD-10-CM | POA: Diagnosis not present

## 2020-11-07 DIAGNOSIS — K219 Gastro-esophageal reflux disease without esophagitis: Secondary | ICD-10-CM | POA: Diagnosis not present

## 2020-11-07 DIAGNOSIS — E876 Hypokalemia: Secondary | ICD-10-CM | POA: Diagnosis not present

## 2020-11-07 DIAGNOSIS — K5900 Constipation, unspecified: Secondary | ICD-10-CM

## 2020-11-07 DIAGNOSIS — M5090 Cervical disc disorder, unspecified, unspecified cervical region: Secondary | ICD-10-CM

## 2020-11-07 DIAGNOSIS — Z23 Encounter for immunization: Secondary | ICD-10-CM

## 2020-11-07 DIAGNOSIS — Z0000 Encounter for general adult medical examination without abnormal findings: Secondary | ICD-10-CM | POA: Diagnosis not present

## 2020-11-07 DIAGNOSIS — Z9861 Coronary angioplasty status: Secondary | ICD-10-CM | POA: Diagnosis not present

## 2020-11-07 DIAGNOSIS — E041 Nontoxic single thyroid nodule: Secondary | ICD-10-CM | POA: Diagnosis not present

## 2020-11-07 DIAGNOSIS — I2510 Atherosclerotic heart disease of native coronary artery without angina pectoris: Secondary | ICD-10-CM

## 2020-11-07 MED ORDER — POTASSIUM CHLORIDE CRYS ER 20 MEQ PO TBCR: 20.0000 meq | EXTENDED_RELEASE_TABLET | Freq: Every day | ORAL | 2 refills | Status: DC

## 2020-11-07 MED ORDER — ATORVASTATIN CALCIUM 80 MG PO TABS: 80.0000 mg | ORAL_TABLET | Freq: Every day | ORAL | 1 refills | Status: DC

## 2020-11-07 MED ORDER — PANTOPRAZOLE SODIUM 40 MG PO TBEC: 40.0000 mg | DELAYED_RELEASE_TABLET | Freq: Every day | ORAL | 1 refills | Status: DC | PRN

## 2020-11-07 MED ORDER — POLYETHYLENE GLYCOL 3350 17 GM/SCOOP PO POWD: 17.0000 g | Freq: Every day | ORAL | 1 refills | Status: AC | PRN

## 2020-11-07 NOTE — Assessment & Plan Note (Signed)
Diet controlled for the past 2-3 years.  Will recheck A1c at next visit.

## 2020-11-07 NOTE — Assessment & Plan Note (Signed)
Following with PM&R, working towards restarting physical therapy.  Proceeding with NCS, discussed risk and benefits of this procedure with her.  May continue Tylenol, Voltaren gel, and stretches as is.

## 2020-11-07 NOTE — Patient Instructions (Addendum)
Wonderful to see you! Keep the appt for the nerve conduction study. Keep using the tylenol, voltaren gel, and stretching/exercises.

## 2020-11-07 NOTE — Assessment & Plan Note (Signed)
Received flu shot today. 

## 2020-11-07 NOTE — Progress Notes (Signed)
    SUBJECTIVE:   CHIEF COMPLAINT / HPI: Follow up   Ms. Sarti is a 79 year old female presenting for follow-up.  Reports she overall is doing well, still having some aches and pains.  Still has intermittent tingling in her fingers when she moves her neck a certain way.  She has been following with PM&R and sports medicine for cervical spinal stenosis, low back pain, and right knee osteoarthritis.  Recommended repeat physical therapy and could consider lumbar blocks in the future.  They also set her up for a nerve conduction study to differentiate neuropathy versus radiculopathy.  She would like to discuss what the study is and the risk associated with it today.  Scheduled to see her cardiologist next year with a repeat echocardiogram.  Needs refills of her statin.  Would like her flu shot today, holding off on getting her Covid booster after she has her mammogram done in 11/2020.  PERTINENT  PMH / PSH: CAD with PCI 06/2016, type 2 diabetes diet controlled, hypertension,osteoarthritis   OBJECTIVE:   BP 122/70   Pulse (!) 57   Ht 5\' 4"  (1.626 m)   Wt 155 lb 4 oz (70.4 kg)   SpO2 96%   BMI 26.65 kg/m   General: Alert, NAD HEENT: NCAT, MMM, thyroid not enlarged to palpation, can feel small nodule on right superior aspect Cardiac: RRR Lungs: Clear bilaterally, no increased WOB  Msk: Moves all extremities spontaneously, normal gait without assistance Ext: Warm, dry, 2+ distal pulses   ASSESSMENT/PLAN:   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 Following with PM&R, working towards restarting physical therapy.  Proceeding with NCS, discussed risk and benefits of this procedure with her.  May continue Tylenol, Voltaren gel, and stretches as is.   Healthcare maintenance Received flu shot today.  Dyslipidemia Refilled atorvastatin for secondary prevention.  Last lipid panel in 01/2020, LDL 98.  Follows with cardiology.  Type 2 diabetes mellitus with circulatory disorder  (HCC) Diet controlled for the past 2-3 years.  Will recheck A1c at next visit.   Thyroid nodule Had few nodules present on U/S in 2009, two of which underwent FNA showing Bethesda category II (benign).  One additional nodule recommended annual ultrasound follow-up.  Will obtain updated thyroid U/S for surveillance, patient would like to postpone until the new year.     Follow-up in approximately 4 months or sooner if needed.  [name], [place]

## 2020-11-07 NOTE — Assessment & Plan Note (Signed)
Had few nodules present on U/S in 2009, two of which underwent FNA showing Bethesda category II (benign).  One additional nodule recommended annual ultrasound follow-up.  Will obtain updated thyroid U/S for surveillance, patient would like to postpone until the new year.

## 2020-11-07 NOTE — Assessment & Plan Note (Signed)
Refilled atorvastatin for secondary prevention.  Last lipid panel in 01/2020, LDL 98.  Follows with cardiology.

## 2020-11-08 ENCOUNTER — Ambulatory Visit: Payer: Medicare Other | Admitting: Physical Therapy

## 2020-11-08 ENCOUNTER — Ambulatory Visit: Payer: Medicare Other | Admitting: Physical Medicine & Rehabilitation

## 2020-11-13 ENCOUNTER — Encounter: Payer: Medicare Other | Admitting: Physical Medicine & Rehabilitation

## 2020-12-05 ENCOUNTER — Ambulatory Visit
Admission: RE | Admit: 2020-12-05 | Discharge: 2020-12-05 | Disposition: A | Discharge status: Home / Self Care | Payer: Medicare Other | Source: Ambulatory Visit | Attending: Family Medicine | Admitting: Family Medicine

## 2020-12-05 DIAGNOSIS — E041 Nontoxic single thyroid nodule: Secondary | ICD-10-CM | POA: Diagnosis not present

## 2020-12-06 ENCOUNTER — Encounter: Payer: Self-pay | Admitting: Family Medicine

## 2020-12-07 ENCOUNTER — Other Ambulatory Visit: Payer: Self-pay

## 2020-12-07 ENCOUNTER — Encounter: Payer: Self-pay | Admitting: Physical Medicine & Rehabilitation

## 2020-12-07 ENCOUNTER — Encounter: Payer: Medicare Other | Attending: Physical Medicine & Rehabilitation | Admitting: Physical Medicine & Rehabilitation

## 2020-12-07 VITALS — BP 151/70 | HR 55 | Temp 98.1°F | Ht 64.0 in | Wt 151.8 lb

## 2020-12-07 DIAGNOSIS — R200 Anesthesia of skin: Secondary | ICD-10-CM | POA: Insufficient documentation

## 2020-12-07 DIAGNOSIS — R202 Paresthesia of skin: Secondary | ICD-10-CM | POA: Diagnosis not present

## 2020-12-07 NOTE — Patient Instructions (Signed)
We will discuss her EMG results at next visit.  You may try wearing a wrist splint on the left wrist when you sleep at night.

## 2020-12-07 NOTE — Progress Notes (Signed)
EMG/NCV of the left upper extremity performed today.  Please refer to media tab for full report.  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  No evidence of ulnar neuropathy.  No needle EMG evidence to suggest cervical radiculopathy. Patient will follow up in 1 month

## 2020-12-10 DIAGNOSIS — H401131 Primary open-angle glaucoma, bilateral, mild stage: Secondary | ICD-10-CM | POA: Diagnosis not present

## 2020-12-18 ENCOUNTER — Other Ambulatory Visit: Payer: Self-pay

## 2020-12-18 ENCOUNTER — Ambulatory Visit
Admission: RE | Admit: 2020-12-18 | Discharge: 2020-12-18 | Disposition: A | Discharge status: Home / Self Care | Payer: Medicare Other | Source: Ambulatory Visit | Attending: Family Medicine | Admitting: Family Medicine

## 2020-12-18 DIAGNOSIS — Z1231 Encounter for screening mammogram for malignant neoplasm of breast: Secondary | ICD-10-CM

## 2021-01-02 ENCOUNTER — Other Ambulatory Visit: Payer: Self-pay | Admitting: Family Medicine

## 2021-01-02 DIAGNOSIS — K219 Gastro-esophageal reflux disease without esophagitis: Secondary | ICD-10-CM

## 2021-01-11 ENCOUNTER — Encounter: Payer: Medicare Other | Attending: Physical Medicine & Rehabilitation | Admitting: Physical Medicine & Rehabilitation

## 2021-01-11 ENCOUNTER — Other Ambulatory Visit: Payer: Self-pay

## 2021-01-11 ENCOUNTER — Encounter: Payer: Self-pay | Admitting: Physical Medicine & Rehabilitation

## 2021-01-11 VITALS — BP 139/70 | HR 48 | Temp 99.2°F | Ht 64.0 in | Wt 154.4 lb

## 2021-01-11 DIAGNOSIS — G5602 Carpal tunnel syndrome, left upper limb: Secondary | ICD-10-CM | POA: Diagnosis not present

## 2021-01-11 DIAGNOSIS — M6530 Trigger finger, unspecified finger: Secondary | ICD-10-CM
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
## 2021-01-11 NOTE — Patient Instructions (Signed)
Wrist plint to be worn at night  Mild carpal tunnel No pinched nerves at the elbow or neck  Trigger fingers in Left hand  Continue Voltaren gel to hand and knee  If numbness gets worse please call and we can do a wrist injection  We can hold off on therapy and can continue exercise at home

## 2021-01-17 DIAGNOSIS — Z23 Encounter for immunization: Secondary | ICD-10-CM | POA: Diagnosis not present

## 2021-02-14 ENCOUNTER — Other Ambulatory Visit: Payer: Self-pay

## 2021-02-14 ENCOUNTER — Ambulatory Visit (HOSPITAL_COMMUNITY): Payer: Medicare Other | Attending: Internal Medicine

## 2021-02-14 DIAGNOSIS — I421 Obstructive hypertrophic cardiomyopathy: Secondary | ICD-10-CM | POA: Diagnosis not present

## 2021-02-14 DIAGNOSIS — I2510 Atherosclerotic heart disease of native coronary artery without angina pectoris: Secondary | ICD-10-CM

## 2021-02-14 LAB — ECHOCARDIOGRAM COMPLETE
Area-P 1/2: 4.17 cm2
S' Lateral: 2.6 cm

## 2021-02-14 MED ORDER — PERFLUTREN LIPID MICROSPHERE
1.0000 mL | INTRAVENOUS | Status: AC | PRN
  Administered 2021-02-14: 1 mL via INTRAVENOUS
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
## 2021-02-18 ENCOUNTER — Ambulatory Visit (INDEPENDENT_AMBULATORY_CARE_PROVIDER_SITE_OTHER): Payer: Medicare Other | Admitting: Cardiology

## 2021-02-18 ENCOUNTER — Encounter: Payer: Self-pay | Admitting: Cardiology

## 2021-02-18 ENCOUNTER — Other Ambulatory Visit: Payer: Self-pay

## 2021-02-18 VITALS — BP 140/64 | HR 51 | Ht 64.0 in | Wt 154.4 lb

## 2021-02-18 DIAGNOSIS — E785 Hyperlipidemia, unspecified: Secondary | ICD-10-CM

## 2021-02-18 DIAGNOSIS — E118 Type 2 diabetes mellitus with unspecified complications: Secondary | ICD-10-CM | POA: Diagnosis not present

## 2021-02-18 DIAGNOSIS — I2510 Atherosclerotic heart disease of native coronary artery without angina pectoris: Secondary | ICD-10-CM | POA: Diagnosis not present

## 2021-02-18 DIAGNOSIS — I10 Essential (primary) hypertension: Secondary | ICD-10-CM

## 2021-02-18 DIAGNOSIS — Z9861 Coronary angioplasty status: Secondary | ICD-10-CM | POA: Diagnosis not present

## 2021-02-18 DIAGNOSIS — I422 Other hypertrophic cardiomyopathy: Secondary | ICD-10-CM | POA: Diagnosis not present

## 2021-02-18 MED ORDER — POTASSIUM CHLORIDE CRYS ER 20 MEQ PO TBCR: 20.0000 meq | EXTENDED_RELEASE_TABLET | Freq: Every day | ORAL | 3 refills | Status: DC

## 2021-02-18 MED ORDER — ROSUVASTATIN CALCIUM 40 MG PO TABS: 40.0000 mg | ORAL_TABLET | Freq: Every day | ORAL | 3 refills | Status: DC

## 2021-02-18 NOTE — Patient Instructions (Signed)
Medication Instructions:  STOP atorvastatin (Lipitor) START rosuvastatin (Crestor) 40 mg daily  *If you need a refill on your cardiac medications before your next appointment, please call your pharmacy*   Lab Work: Please return for FASTING labs in 10 weeks (Lipid, Hepatic)  Our in office lab hours are Monday-Friday 8:00-4:00, closed for lunch 12:45-1:45 pm.  No appointment needed.  Follow-Up: At [facility], you and your health needs are our priority.  As part of our continuing mission to provide you with exceptional heart care, we have created designated Provider Care Teams.  These Care Teams include your primary Cardiologist (physician) and Advanced Practice Providers (APPs -  Physician Assistants and Nurse Practitioners) who all work together to provide you with the care you need, when you need it.  We recommend signing up for the patient portal called "MyChart".  Sign up information is provided on this After Visit Summary.  MyChart is used to connect with patients for Virtual Visits (Telemedicine).  Patients are able to view lab/test results, encounter notes, upcoming appointments, etc.  Non-urgent messages can be sent to your provider as well.   To learn more about what you can do with MyChart, go to [email or web address].    Your next appointment:   12 month(s)  The format for your next appointment:   In Person  Provider:   Minus Breeding, MD

## 2021-02-22 ENCOUNTER — Encounter: Payer: Self-pay | Admitting: Physical Medicine & Rehabilitation

## 2021-02-22 ENCOUNTER — Encounter: Payer: Medicare Other | Attending: Physical Medicine & Rehabilitation | Admitting: Physical Medicine & Rehabilitation

## 2021-02-22 ENCOUNTER — Other Ambulatory Visit: Payer: Self-pay

## 2021-02-22 VITALS — BP 160/78 | HR 56 | Temp 98.9°F | Ht 64.0 in | Wt 155.0 lb

## 2021-02-22 DIAGNOSIS — Z9861 Coronary angioplasty status: Secondary | ICD-10-CM

## 2021-02-22 DIAGNOSIS — M6530 Trigger finger, unspecified finger: Secondary | ICD-10-CM | POA: Diagnosis not present

## 2021-02-22 DIAGNOSIS — I2510 Atherosclerotic heart disease of native coronary artery without angina pectoris: Secondary | ICD-10-CM | POA: Diagnosis not present

## 2021-02-22 DIAGNOSIS — G5602 Carpal tunnel syndrome, left upper limb: Secondary | ICD-10-CM | POA: Diagnosis not present

## 2021-02-22 DIAGNOSIS — M1711 Unilateral primary osteoarthritis, right knee: Secondary | ICD-10-CM | POA: Diagnosis not present
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
## 2021-02-22 NOTE — Patient Instructions (Signed)
If the increased voltaren gel to 4 x per day does not help with Right knee pain would recommend    Knee Injection A knee injection is a procedure to get medicine into your knee joint to relieve the pain, swelling, and stiffness of arthritis. Your health care provider uses a needle to inject medicine, which may also help to lubricate and cushion your knee joint. You may need more than one injection. Tell a health care provider about:  Any allergies you have.  All medicines you are taking, including vitamins, herbs, eye drops, creams, and over-the-counter medicines.  Any problems you or family members have had with anesthetic medicines.  Any blood disorders you have.  Any surgeries you have had.  Any medical conditions you have.  Whether you are pregnant or may be pregnant. What are the risks? Generally, this is a safe procedure. However, problems may occur, including:  Infection.  Bleeding.  Symptoms that get worse.  Damage to the area around your knee.  Allergic reaction to any of the medicines.  Skin reactions from repeated injections. What happens before the procedure?  Ask your health care provider about: ? Changing or stopping your regular medicines. This is especially important if you are taking diabetes medicines or blood thinners. ? Taking medicines such as aspirin and ibuprofen. These medicines can thin your blood. Do not take these medicines unless your health care provider tells you to take them. ? Taking over-the-counter medicines, vitamins, herbs, and supplements.  Plan to have a responsible adult take you home from the hospital or clinic. What happens during the procedure?  You will sit or lie down in a position for your knee to be treated.  The skin over your kneecap will be cleaned with a germ-killing soap.  You will be given a medicine that numbs the area (local anesthetic). You may feel some stinging.  The medicine will be injected into your knee. The  needle is carefully placed between your kneecap and your knee. The medicine is injected into the joint space.  The needle will be removed at the end of the procedure.  A bandage (dressing) may be placed over the injection site. The procedure may vary among health care providers and hospitals.   What can I expect after the procedure?  Your blood pressure, heart rate, breathing rate, and blood oxygen level will be monitored until you leave the hospital or clinic.  You may have to move your knee through its full range of motion. This helps to get all the medicine into your joint space.  You will be watched to make sure that you do not have a reaction to the injected medicine.  You may feel more pain, swelling, and warmth than you did before the injection. This reaction may last about 1-2 days. Follow these instructions at home: Medicines  Take over-the-counter and prescription medicines only as told by your health care provider.  Ask your health care provider if the medicine prescribed to you requires you to avoid driving or using machinery.  Do not take medicines such as aspirin and ibuprofen unless your health care provider tells you to take them. Injection site care  Follow instructions from your health care provider about: ? How to take care of your puncture site. ? When and how you should change your dressing. ? When you should remove your dressing.  Check your injection area every day for signs of infection. Check for: ? More redness, swelling, or pain after 2 days. ?  Fluid or blood. ? Pus or a bad smell. ? Warmth. Managing pain, stiffness, and swelling  If directed, put ice on the injection area. To do this: ? Put ice in a plastic bag. ? Place a towel between your skin and the bag. ? Leave the ice on for 20 minutes, 2-3 times per day. ? Remove the ice if your skin turns bright red. This is very important. If you cannot feel pain, heat, or cold, you have a greater risk of  damage to the area.  Do not apply heat to your knee.  Raise (elevate) the injection area above the level of your heart while you are sitting or lying down.   General instructions  If you were given a dressing, keep it dry until your health care provider says it can be removed. Ask your health care provider when you can start showering or bathing.  Avoid strenuous activities for as long as directed by your health care provider. Ask your health care provider when you can return to your normal activities.  Keep all follow-up visits. This is important. You may need more injections. Contact a health care provider if you have:  A fever.  Warmth in your injection area.  Fluid, blood, or pus coming from your injection site.  Symptoms at your injection site that last longer than 2 days after your procedure. Get help right away if:  Your knee turns very red.  Your knee becomes very swollen.  Your knee is in severe pain. Summary  A knee injection is a procedure to get medicine into your knee joint to relieve the pain, swelling, and stiffness of arthritis.  A needle is carefully placed between your kneecap and your knee to inject medicine into the joint space.  Before the procedure, ask your health care provider about changing or stopping your regular medicines, especially if you are taking diabetes medicines or blood thinners.  Contact your health care provider if you have any problems or questions after your procedure. This information is not intended to replace advice given to you by your health care provider. Make sure you discuss any questions you have with your health care provider. Document Revised: 05/30/2020 Document Reviewed: 05/30/2020 Elsevier Patient Education  2021 [employer].

## 2021-03-08 ENCOUNTER — Ambulatory Visit (INDEPENDENT_AMBULATORY_CARE_PROVIDER_SITE_OTHER): Payer: Medicare Other | Admitting: Family Medicine

## 2021-03-08 ENCOUNTER — Encounter: Payer: Self-pay | Admitting: Family Medicine

## 2021-03-08 ENCOUNTER — Other Ambulatory Visit: Payer: Self-pay

## 2021-03-08 VITALS — BP 130/78 | HR 53 | Wt 155.4 lb

## 2021-03-08 DIAGNOSIS — I10 Essential (primary) hypertension: Secondary | ICD-10-CM

## 2021-03-08 DIAGNOSIS — Z9861 Coronary angioplasty status: Secondary | ICD-10-CM

## 2021-03-08 DIAGNOSIS — M6530 Trigger finger, unspecified finger: Secondary | ICD-10-CM | POA: Diagnosis not present

## 2021-03-08 DIAGNOSIS — E1159 Type 2 diabetes mellitus with other circulatory complications: Secondary | ICD-10-CM | POA: Diagnosis not present

## 2021-03-08 DIAGNOSIS — I2510 Atherosclerotic heart disease of native coronary artery without angina pectoris: Secondary | ICD-10-CM | POA: Diagnosis not present

## 2021-03-08 DIAGNOSIS — E785 Hyperlipidemia, unspecified: Secondary | ICD-10-CM

## 2021-03-08 DIAGNOSIS — M1990 Unspecified osteoarthritis, unspecified site: Secondary | ICD-10-CM

## 2021-03-08 HISTORY — DX: Trigger finger, unspecified finger: M65.30

## 2021-03-08 LAB — POCT GLYCOSYLATED HEMOGLOBIN (HGB A1C): HbA1c, POC (controlled diabetic range): 5.7 % (ref 0.0–7.0)

## 2021-03-08 NOTE — Patient Instructions (Signed)
Keep up the good work! Make sure you get your labs for your cardiologist and keep crestor at night.   You can keep using asperceme or start voltaren gel 4 times daily as needed.

## 2021-03-08 NOTE — Assessment & Plan Note (Addendum)
Well-controlled on Norvasc, Lotensin, and Lopressor.  Continue regimen as is.  Will add on BMP to future labs as she soon will be proceeding for repeat hepatic/lipid panel.Marland Kitchen

## 2021-03-08 NOTE — Assessment & Plan Note (Signed)
Follow-up with Dr. Posey Pronto on 3/31 for injection.

## 2021-03-08 NOTE — Assessment & Plan Note (Signed)
Secondary prevention.  Tolerating transition of Crestor well, rechecking lipid panel through her cardiologist in 2 months.

## 2021-03-08 NOTE — Assessment & Plan Note (Signed)
A1c 5.7%.  Longstanding diet controlled.  Will recheck in the next 3-6 months.

## 2021-03-08 NOTE — Assessment & Plan Note (Signed)
Well managed currently.  Also follows with PM&R.  Continue Aspercreme/Voltaren gel as needed.

## 2021-03-08 NOTE — Progress Notes (Signed)
    SUBJECTIVE:   CHIEF COMPLAINT / HPI: Check in   Heidi Wolf is a 80 year old female presenting for follow-up to check in.  T2DM: Diet controlled for the past several years.  A1c 5.7 today.  She is due to see her ophthalmologist on 3/15.  She continues to follow a well-balanced diet and staying as active she can with her arthritis.  Due for foot exam.  Checks her feet every single day and wears lotion.  Left hand trigger fingers: Following with PM&R for this.  Scheduled to see Dr. Posey Pronto on 3/31 to trial trigger finger injections.  Osteoarthritis: Following with PM&R.  Mainly in her hips and knees.  Overall well controlled currently.  Uses Aspercreme on a frequent basis with relief.  Will return to Dr. Letta Pate in 1 month and may get a repeat knee injection at that time.  CAD/hyperlipidemia: Recently saw her cardiologist Dr. Percival Spanish in 11/2021.  Updated her echo as well showing apical hypertrophy.  No change in management.  Switched her from Lipitor to Crestor and rechecking her cholesterol in the next 2 months.  She is tolerating Crestor well without any significant muscle aches.   PERTINENT  PMH / PSH: CAD with PCI in 06/2016, type 2 diabetes diet controlled with A1c consistently under 6%, hypertension, osteoarthritis, trigger finger  OBJECTIVE:   BP 130/78   Pulse (!) 53   Wt 155 lb 6.4 oz (70.5 kg)   SpO2 97%   BMI 26.67 kg/m   General: Alert, NAD HEENT: NCAT, MMM Cardiac: RRR  Lungs: Clear bilaterally, no increased WOB  Msk: Normal gait without assistive device Ext: Warm, dry, 2+ distal pulses  Diabetic Foot Exam - Simple   Simple Foot Form Diabetic Foot exam was performed with the following findings: Yes 03/08/2021 10:38 AM  Visual Inspection No deformities, no ulcerations, no other skin breakdown bilaterally: Yes Sensation Testing Intact to touch and monofilament testing bilaterally: Yes Pulse Check Posterior Tibialis and Dorsalis pulse intact bilaterally:  Yes Comments     ASSESSMENT/PLAN:   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HCC) A1c 5.7%.  Longstanding diet controlled.  Will recheck in the next 3-6 months.  Dyslipidemia Secondary prevention.  Tolerating transition of Crestor well, rechecking lipid panel through her cardiologist in 2 months.  Essential hypertension Well-controlled on Norvasc, Lotensin, and Lopressor.  Continue regimen as is.  Will add on BMP to future labs as she soon will be proceeding for repeat hepatic/lipid panel..  Trigger finger, left Follow-up with Dr. Posey Pronto on 3/31 for injection.  Arthritis Well managed currently.  Also follows with PM&R.  Continue Aspercreme/Voltaren gel as needed.    Follow-up in approximately 3 months or sooner if needed.  Heidi Wolf, [place]

## 2021-03-12 DIAGNOSIS — H401131 Primary open-angle glaucoma, bilateral, mild stage: Secondary | ICD-10-CM | POA: Diagnosis not present

## 2021-03-12 DIAGNOSIS — H2513 Age-related nuclear cataract, bilateral: Secondary | ICD-10-CM | POA: Diagnosis not present

## 2021-03-12 DIAGNOSIS — H25013 Cortical age-related cataract, bilateral: Secondary | ICD-10-CM | POA: Diagnosis not present

## 2021-03-26 ENCOUNTER — Other Ambulatory Visit: Payer: Self-pay | Admitting: Cardiology

## 2021-03-28 ENCOUNTER — Encounter: Payer: Medicare Other | Admitting: Physical Medicine & Rehabilitation

## 2021-04-02 ENCOUNTER — Encounter: Payer: Self-pay | Admitting: Physical Medicine & Rehabilitation

## 2021-04-02 ENCOUNTER — Encounter: Payer: Medicare Other | Attending: Physical Medicine & Rehabilitation | Admitting: Physical Medicine & Rehabilitation

## 2021-04-02 ENCOUNTER — Other Ambulatory Visit: Payer: Self-pay

## 2021-04-02 VITALS — BP 143/78 | HR 50 | Temp 98.3°F | Ht 64.0 in | Wt 157.0 lb

## 2021-04-02 DIAGNOSIS — M1711 Unilateral primary osteoarthritis, right knee: Secondary | ICD-10-CM | POA: Diagnosis not present

## 2021-04-02 NOTE — Progress Notes (Signed)
Knee injection RIght   Indication:Right Knee pain not relieved by medication management and other conservative care.  Informed consent was obtained after describing risks and benefits of the procedure with the patient, this includes bleeding, bruising, infection and medication side effects. The patient wishes to proceed and has given written consent. The patient was placed in a recumbent position. The medial aspect of the knee was marked and prepped with Betadine and alcohol. It was then entered with a 25-gauge 1-1/2 inch needle  inserted into the knee joint. After negative draw back for blood, a solution containing one ML of 6mg  per mL betamethasone and 4 mL of 1% lidocaine were injected. The patient tolerated the procedure well. Post procedure instructions were given.

## 2021-04-02 NOTE — Patient Instructions (Signed)
Knee Injection A knee injection is a procedure to get medicine into your knee joint to relieve the pain, swelling, and stiffness of arthritis. Your health care provider uses a needle to inject medicine, which may also help to lubricate and cushion your knee joint. You may need more than one injection. Tell a health care provider about:  Any allergies you have.  All medicines you are taking, including vitamins, herbs, eye drops, creams, and over-the-counter medicines.  Any problems you or family members have had with anesthetic medicines.  Any blood disorders you have.  Any surgeries you have had.  Any medical conditions you have.  Whether you are pregnant or may be pregnant. What are the risks? Generally, this is a safe procedure. However, problems may occur, including:  Infection.  Bleeding.  Symptoms that get worse.  Damage to the area around your knee.  Allergic reaction to any of the medicines.  Skin reactions from repeated injections. What happens before the procedure?  Ask your health care provider about: ? Changing or stopping your regular medicines. This is especially important if you are taking diabetes medicines or blood thinners. ? Taking medicines such as aspirin and ibuprofen. These medicines can thin your blood. Do not take these medicines unless your health care provider tells you to take them. ? Taking over-the-counter medicines, vitamins, herbs, and supplements.  Plan to have a responsible adult take you home from the hospital or clinic. What happens during the procedure?  You will sit or lie down in a position for your knee to be treated.  The skin over your kneecap will be cleaned with a germ-killing soap.  You will be given a medicine that numbs the area (local anesthetic). You may feel some stinging.  The medicine will be injected into your knee. The needle is carefully placed between your kneecap and your knee. The medicine is injected into the  joint space.  The needle will be removed at the end of the procedure.  A bandage (dressing) may be placed over the injection site. The procedure may vary among health care providers and hospitals.   What can I expect after the procedure?  Your blood pressure, heart rate, breathing rate, and blood oxygen level will be monitored until you leave the hospital or clinic.  You may have to move your knee through its full range of motion. This helps to get all the medicine into your joint space.  You will be watched to make sure that you do not have a reaction to the injected medicine.  You may feel more pain, swelling, and warmth than you did before the injection. This reaction may last about 1-2 days. Follow these instructions at home: Medicines  Take over-the-counter and prescription medicines only as told by your health care provider.  Ask your health care provider if the medicine prescribed to you requires you to avoid driving or using machinery.  Do not take medicines such as aspirin and ibuprofen unless your health care provider tells you to take them. Injection site care  Follow instructions from your health care provider about: ? How to take care of your puncture site. ? When and how you should change your dressing. ? When you should remove your dressing.  Check your injection area every day for signs of infection. Check for: ? More redness, swelling, or pain after 2 days. ? Fluid or blood. ? Pus or a bad smell. ? Warmth. Managing pain, stiffness, and swelling  If directed, put ice on   the injection area. To do this: ? Put ice in a plastic bag. ? Place a towel between your skin and the bag. ? Leave the ice on for 20 minutes, 2-3 times per day. ? Remove the ice if your skin turns bright red. This is very important. If you cannot feel pain, heat, or cold, you have a greater risk of damage to the area.  Do not apply heat to your knee.  Raise (elevate) the injection area  above the level of your heart while you are sitting or lying down.   General instructions  If you were given a dressing, keep it dry until your health care provider says it can be removed. Ask your health care provider when you can start showering or bathing.  Avoid strenuous activities for as long as directed by your health care provider. Ask your health care provider when you can return to your normal activities.  Keep all follow-up visits. This is important. You may need more injections. Contact a health care provider if you have:  A fever.  Warmth in your injection area.  Fluid, blood, or pus coming from your injection site.  Symptoms at your injection site that last longer than 2 days after your procedure. Get help right away if:  Your knee turns very red.  Your knee becomes very swollen.  Your knee is in severe pain. Summary  A knee injection is a procedure to get medicine into your knee joint to relieve the pain, swelling, and stiffness of arthritis.  A needle is carefully placed between your kneecap and your knee to inject medicine into the joint space.  Before the procedure, ask your health care provider about changing or stopping your regular medicines, especially if you are taking diabetes medicines or blood thinners.  Contact your health care provider if you have any problems or questions after your procedure. This information is not intended to replace advice given to you by your health care provider. Make sure you discuss any questions you have with your health care provider. Document Revised: 05/30/2020 Document Reviewed: 05/30/2020 Elsevier Patient Education  2021 [employer] Inc.  

## 2021-04-08 ENCOUNTER — Telehealth (INDEPENDENT_AMBULATORY_CARE_PROVIDER_SITE_OTHER): Payer: Medicare Other | Admitting: Family Medicine

## 2021-04-08 ENCOUNTER — Encounter: Payer: Self-pay | Admitting: Family Medicine

## 2021-04-08 DIAGNOSIS — B349 Viral infection, unspecified: Secondary | ICD-10-CM

## 2021-04-08 NOTE — Assessment & Plan Note (Addendum)
Likely viral etiology causing symptoms. Cannot rule out COVID.  Given reports of SOB and tachycardia would recommend patient to be seen in urgent care for better evaluation as unable to complete cardioresp eval given the nature of visit. -Strict return precautions provided

## 2021-04-08 NOTE — Progress Notes (Signed)
[facility] Telemedicine Visit  Patient consented to have virtual visit and was identified by name and date of birth. Method of visit: Telephone  Encounter participants: Patient: Heidi Wolf - located at home Provider: Carollee Leitz - located at home office Others (if applicable): n/a  Chief Complaint:  congested  HPI:  Patient reports head and chest congestion for 1 week.  Has been taking Corisidin which has not been helping. Reports clear nasal discharge, cough, SOB with exertion and intermittent tachycardia. Denies any chest pain, dyspnea, fevers, or recent sick contacts.   ROS: per HPI  Pertinent PMHx:  HTN Type 2 DM CAD  Exam:  There were no vitals taken for this visit.  Respiratory: no cough or wheezing heard, speaking in short sentences  Assessment/Plan:  Viral illness Likely viral etiology causing symptoms. Cannot rule out COVID.  Given reports of SOB and tachycardia would recommend patient to be seen in urgent care for better evaluation as unable to complete cardioresp eval given the nature of visit. -Strict return precautions provided    Time spent during visit with patient: 15 minutes

## 2021-04-09 ENCOUNTER — Other Ambulatory Visit: Payer: Self-pay

## 2021-04-09 ENCOUNTER — Ambulatory Visit (HOSPITAL_COMMUNITY)
Admission: EM | Admit: 2021-04-09 | Discharge: 2021-04-09 | Disposition: A | Discharge status: Home / Self Care | Payer: Medicare Other | Attending: Internal Medicine | Admitting: Internal Medicine

## 2021-04-09 ENCOUNTER — Encounter (HOSPITAL_COMMUNITY): Payer: Self-pay | Admitting: *Deleted

## 2021-04-09 ENCOUNTER — Encounter (HOSPITAL_COMMUNITY): Payer: Self-pay

## 2021-04-09 ENCOUNTER — Emergency Department (HOSPITAL_COMMUNITY): Payer: Medicare Other

## 2021-04-09 ENCOUNTER — Emergency Department (HOSPITAL_COMMUNITY)
Admission: EM | Admit: 2021-04-09 | Discharge: 2021-04-09 | Disposition: A | Discharge status: Home / Self Care | Payer: Medicare Other | Attending: Emergency Medicine | Admitting: Emergency Medicine

## 2021-04-09 DIAGNOSIS — I2510 Atherosclerotic heart disease of native coronary artery without angina pectoris: Secondary | ICD-10-CM | POA: Insufficient documentation

## 2021-04-09 DIAGNOSIS — I10 Essential (primary) hypertension: Secondary | ICD-10-CM | POA: Insufficient documentation

## 2021-04-09 DIAGNOSIS — I200 Unstable angina: Secondary | ICD-10-CM | POA: Diagnosis not present

## 2021-04-09 DIAGNOSIS — Z20822 Contact with and (suspected) exposure to covid-19: Secondary | ICD-10-CM | POA: Insufficient documentation

## 2021-04-09 DIAGNOSIS — Z7982 Long term (current) use of aspirin: Secondary | ICD-10-CM | POA: Insufficient documentation

## 2021-04-09 DIAGNOSIS — E1159 Type 2 diabetes mellitus with other circulatory complications: Secondary | ICD-10-CM | POA: Diagnosis not present

## 2021-04-09 DIAGNOSIS — Z79899 Other long term (current) drug therapy: Secondary | ICD-10-CM | POA: Insufficient documentation

## 2021-04-09 DIAGNOSIS — J069 Acute upper respiratory infection, unspecified: Secondary | ICD-10-CM | POA: Diagnosis not present

## 2021-04-09 DIAGNOSIS — R072 Precordial pain: Secondary | ICD-10-CM

## 2021-04-09 DIAGNOSIS — Z955 Presence of coronary angioplasty implant and graft: Secondary | ICD-10-CM | POA: Diagnosis not present

## 2021-04-09 DIAGNOSIS — R059 Cough, unspecified: Secondary | ICD-10-CM | POA: Diagnosis present

## 2021-04-09 DIAGNOSIS — R079 Chest pain, unspecified: Secondary | ICD-10-CM | POA: Diagnosis not present

## 2021-04-09 LAB — RESP PANEL BY RT-PCR (FLU A&B, COVID) ARPGX2
Influenza A by PCR: NEGATIVE
Influenza B by PCR: NEGATIVE
SARS Coronavirus 2 by RT PCR: NEGATIVE

## 2021-04-09 LAB — COMPREHENSIVE METABOLIC PANEL
ALT: 15 U/L (ref 0–44)
AST: 18 U/L (ref 15–41)
Albumin: 4 g/dL (ref 3.5–5.0)
Alkaline Phosphatase: 58 U/L (ref 38–126)
Anion gap: 8 (ref 5–15)
BUN: 15 mg/dL (ref 8–23)
CO2: 26 mmol/L (ref 22–32)
Calcium: 9.4 mg/dL (ref 8.9–10.3)
Chloride: 106 mmol/L (ref 98–111)
Creatinine, Ser: 1.05 mg/dL — ABNORMAL HIGH (ref 0.44–1.00)
GFR, Estimated: 54 mL/min — ABNORMAL LOW (ref >60)
Glucose, Bld: 111 mg/dL — ABNORMAL HIGH (ref 70–99)
Potassium: 3.7 mmol/L (ref 3.5–5.1)
Sodium: 140 mmol/L (ref 135–145)
Total Bilirubin: 0.8 mg/dL (ref 0.3–1.2)
Total Protein: 7.6 g/dL (ref 6.5–8.1)

## 2021-04-09 LAB — CBG MONITORING, ED: Glucose-Capillary: 119 mg/dL — ABNORMAL HIGH (ref 70–99)

## 2021-04-09 LAB — CBC
HCT: 46.1 % — ABNORMAL HIGH (ref 36.0–46.0)
Hemoglobin: 15 g/dL (ref 12.0–15.0)
MCH: 26.6 pg (ref 26.0–34.0)
MCHC: 32.5 g/dL (ref 30.0–36.0)
MCV: 81.7 fL (ref 80.0–100.0)
Platelets: 262 10*3/uL (ref 150–400)
RBC: 5.64 MIL/uL — ABNORMAL HIGH (ref 3.87–5.11)
RDW: 13.7 % (ref 11.5–15.5)
WBC: 10.4 10*3/uL (ref 4.0–10.5)
nRBC: 0 % (ref 0.0–0.2)

## 2021-04-09 LAB — TROPONIN I (HIGH SENSITIVITY)
Troponin I (High Sensitivity): 11 ng/L (ref <18)
Troponin I (High Sensitivity): 8 ng/L (ref <18)

## 2021-04-09 MED ORDER — BENZONATATE 100 MG PO CAPS: 100.0000 mg | ORAL_CAPSULE | Freq: Three times a day (TID) | ORAL | 0 refills | Status: DC

## 2021-04-09 NOTE — ED Provider Notes (Signed)
[facility] EMERGENCY DEPARTMENT Provider Note   CSN: [id number] Arrival date & time: 04/09/21  6063     History Chief Complaint  Patient presents with  . Chest Pain    Heidi Wolf is a 80 y.o. female.  Patient with history of coronary artery disease status post stenting, hypertension, hyperlipidemia, abnormal EKG, hypertrophy noted on recent echocardiogram --presents the emergency department for cough and congestion that has been ongoing for several days.  Cough is productive with clear sputum.  She has had nasal congestion and runny nose.  Yesterday she had a televisit with her PCP who recommended that she go to the urgent care today, in part for Covid testing.   This morning she had pain in her left chest with radiation to the left neck.  No associated vomiting.  She has had some shortness of breath today.  When she went to urgent care, she was referred to the emergency department for cardiac evaluation.  Patient denies fevers.  She has been taking over-the-counter medications without improvement.        Past Medical History:  Diagnosis Date  . Allergy   . Anemia   . Anxiety   . Arthritis    "right hip" (09/21/2014)  . CAD (coronary artery disease)    PCI 90% diagonal stenosis witha DES July 2017.  Nonobstructive disease elsewhere.   . Cataracts, both eyes   . Colon polyps    Hyperplastic 2003  . External hemorrhoid   . GERD (gastroesophageal reflux disease)   . Glaucoma   . Hyperlipidemia   . Hypertension   . Internal hemorrhoid   . Knee effusion, right 03/05/2020  . Myocardial infarction ([place])   . Palpitations 09/03/2016   [place] Aug 2017- PACs, no arrhythmia.   . Personal history of colonic polyps 03/03/2012  . Type II diabetes mellitus ([place])    type 2  . Whiplash injury, acute, sequela 03/05/2020    Patient Active Problem List   Diagnosis Date Noted  . Viral illness 04/08/2021  . Primary osteoarthritis of right knee 04/02/2021  . Trigger  finger, left 03/08/2021  . Healthcare maintenance 11/07/2020  . Generalized osteoarthritis of hand 04/13/2020  . Thyroid nodule 04/09/2018  . GERD (gastroesophageal reflux disease)   . Cataracts, both eyes   . Arthritis   . Essential hypertension 09/03/2016  . Dyslipidemia 09/03/2016  . 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[place]) 09/03/2016  . CAD- S/P PCI July 2017 07/02/2016  . 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 Personal history of colonic polyps 03/03/2012  . Internal hemorrhoids without mention of complication 01/60/1093    Past Surgical History:  Procedure Laterality Date  . ABDOMINAL HYSTERECTOMY    . APPENDECTOMY     "w/gallbladder"  . CARDIAC CATHETERIZATION  X 2  . CARDIAC CATHETERIZATION N/A 07/03/2016   Procedure: Left Heart Cath and Coronary Angiography;  Surgeon: Dixie Dials, MD;  Location: [facility];  Service: Cardiovascular;  Laterality: N/A;  . CARDIAC CATHETERIZATION N/A 07/03/2016   Procedure: Coronary Stent Intervention;  Surgeon: Jettie Booze, MD;  Location: [facility];  Service: Cardiovascular;  Laterality: N/A;  . CARDIAC CATHETERIZATION N/A 07/15/2016   Procedure: Left Heart Cath and Coronary Angiography;  Surgeon: Dixie Dials, MD;  Location: [facility];  Service: Cardiovascular;  Laterality: N/A;  . CHOLECYSTECTOMY    . COLONOSCOPY    . CORONARY ANGIOPLASTY WITH STENT PLACEMENT  06/2016  .  TUBAL LIGATION  1971     OB History   No obstetric history on file.     Family History  Problem Relation Age of Onset  . Diabetes Mother   . Arthritis Mother   . CAD Mother 12       CABG  . CAD Brother   . CAD Sister 33       CABG  . Stroke Brother   . Stroke Daughter   . Diabetes Maternal Grandmother   . Stroke Maternal Grandmother   . Stroke Father   . Heart failure Sister   . Kidney failure Sister   . Breast cancer Other   . Colon cancer Neg Hx   . Stomach cancer Neg Hx   .  Esophageal cancer Neg Hx   . Pancreatic cancer Neg Hx   . Rectal cancer Neg Hx     Social History   Tobacco Use  . Smoking status: Never Smoker  . Smokeless tobacco: Never Used  Vaping Use  . Vaping Use: Never used  Substance Use Topics  . Alcohol use: No  . Drug use: No    Home Medications Prior to Admission medications   Medication Sig Start Date End Date Taking? Authorizing Provider  ACCU-CHEK SOFTCLIX LANCETS lancets Daily as needed. 05/26/18   Rogue Bussing, MD  amLODipine (NORVASC) 10 MG tablet TAKE 1 TABLET(10 MG) BY MOUTH DAILY 04/09/20   [name], DO  aspirin EC 81 MG tablet Take 1 tablet (81 mg total) by mouth daily. 07/04/16   Dixie Dials, MD  benazepril (LOTENSIN) 20 MG tablet TAKE 1 TABLET(20 MG) BY MOUTH DAILY 03/26/21   Minus Breeding, MD  CHOLECALCIFEROL PO Take 1,000 Units by mouth.    [provider]  glucose blood (ACCU-CHEK AVIVA PLUS) test strip Check blood sugar up to once daily as needed. 05/03/19   Darrelyn Hillock N, DO  LUMIGAN 0.01 % SOLN Place 1 drop into both eyes at bedtime.  04/13/14   [provider]  metoprolol tartrate (LOPRESSOR) 25 MG tablet TAKE 1/2 TABLET BY MOUTH AM AND 1 TABLET BY MOUTH EVERY NIGHT AT BEDTIME 03/26/21   Minus Breeding, MD  nitroGLYCERIN (NITROSTAT) 0.4 MG SL tablet Place 1 tablet (0.4 mg total) under the tongue every 5 (five) minutes as needed for chest pain. 04/09/20   Minus Breeding, MD  pantoprazole (PROTONIX) 40 MG tablet TAKE 1 TABLET(40 MG) BY MOUTH DAILY AS NEEDED 01/02/21   [name], DO  polyethylene glycol powder (GLYCOLAX/MIRALAX) 17 GM/SCOOP powder Take 17 g by mouth daily as needed. 11/07/20   [name], DO  potassium chloride SA (KLOR-CON) 20 MEQ tablet Take 1 tablet (20 mEq total) by mouth daily. 02/18/21   Minus Breeding, MD  rosuvastatin (CRESTOR) 40 MG tablet Take 1 tablet (40 mg total) by mouth daily. 02/18/21 05/19/21  Minus Breeding, MD  trolamine salicylate  (ASPERCREME) 10 % cream Apply 1 application topically as needed for muscle pain.    [provider]    Allergies    Aspirin  Review of Systems   Review of Systems  Constitutional: Negative for diaphoresis and fever.  HENT: Positive for congestion and rhinorrhea. Negative for ear pain.   Eyes: Negative for redness.  Respiratory: Positive for cough and shortness of breath.   Cardiovascular: Positive for chest pain and palpitations. Negative for leg swelling.  Gastrointestinal: Negative for abdominal pain, nausea and vomiting.  Genitourinary: Negative for dysuria.  Musculoskeletal: Negative for back pain  and neck pain.  Skin: Negative for rash.  Neurological: Negative for syncope and light-headedness.  Psychiatric/Behavioral: The patient is nervous/anxious.     Physical Exam Updated Vital Signs BP 101/78   Pulse (!) 54   Temp 98.6 F (37 C) (Oral)   Resp 17   Ht 5\' 4"  (1.626 m)   Wt 70.3 kg   SpO2 98%   BMI 26.61 kg/m   Physical Exam Vitals and nursing note reviewed.  Constitutional:      Appearance: She is well-developed. She is not diaphoretic.  HENT:     Head: Normocephalic and atraumatic.     Mouth/Throat:     Mouth: Mucous membranes are not dry.  Eyes:     Conjunctiva/sclera: Conjunctivae normal.  Neck:     Vascular: Normal carotid pulses. No carotid bruit or JVD.     Trachea: Trachea normal. No tracheal deviation.  Cardiovascular:     Rate and Rhythm: Normal rate and regular rhythm.     Pulses: No decreased pulses.     Heart sounds: Normal heart sounds, S1 normal and S2 normal. No murmur heard.   Pulmonary:     Effort: Pulmonary effort is normal. No respiratory distress.     Breath sounds: No wheezing, rhonchi or rales.  Chest:     Chest wall: No tenderness.  Abdominal:     General: Bowel sounds are normal.     Palpations: Abdomen is soft.     Tenderness: There is no abdominal tenderness. There is no guarding or rebound.  Musculoskeletal:         General: Normal range of motion.     Cervical back: Normal range of motion and neck supple. No muscular tenderness.  Skin:    General: Skin is warm and dry.     Coloration: Skin is not pale.  Neurological:     Mental Status: She is alert.     ED Results / Procedures / Treatments   Labs (all labs ordered are listed, but only abnormal results are displayed) Labs Reviewed  CBC - Abnormal; Notable for the following components:      Result Value   RBC 5.64 (*)    HCT 46.1 (*)    All other components within normal limits  COMPREHENSIVE METABOLIC PANEL - Abnormal; Notable for the following components:   Glucose, Bld 111 (*)    Creatinine, Ser 1.05 (*)    GFR, Estimated 54 (*)    All other components within normal limits  CBG MONITORING, ED - Abnormal; Notable for the following components:   Glucose-Capillary 119 (*)    All other components within normal limits  RESP PANEL BY RT-PCR (FLU A&B, COVID) ARPGX2  TROPONIN I (HIGH SENSITIVITY)  TROPONIN I (HIGH SENSITIVITY)    EKG EKG Interpretation  Date/Time:  Tuesday April 09 2021 09:58:19 EDT Ventricular Rate:  51 PR Interval:  174 QRS Duration: 104 QT Interval:  467 QTC Calculation: 431 R Axis:   71 Text Interpretation: Sinus rhythm Abnormal R-wave progression, early transition Abnormal T, consider ischemia, diffuse leads T wave inversions similar to Feb 2021 tracing Confirmed by Nanda Quinton [phone]) on 04/09/2021 10:04:48 AM   Radiology DG Chest Portable 1 View  Result Date: 04/09/2021 CLINICAL DATA:  Chest pain EXAM: PORTABLE CHEST 1 VIEW COMPARISON:  02/14/2020 FINDINGS: Heart is borderline in size. Lungs clear. No effusions. No acute bony abnormality. Scattered aortic atherosclerosis. IMPRESSION: No active cardiopulmonary disease. Electronically Signed   By: Rolm Baptise M.D.   On:  04/09/2021 11:02    Procedures Procedures   Medications Ordered in ED Medications - No data to display  ED Course  I have reviewed  the triage vital signs and the nursing notes.  Pertinent labs & imaging results that were available during my care of the patient were reviewed by me and considered in my medical decision making (see chart for details).  Patient seen and examined. Work-up initiated.  EKG reviewed, abnormal but has been so for years on review. She looks well. Her work-up concerning for respiratory infection. Will need cardiac r/o and covid test.   Vital signs reviewed and are as follows: BP 101/78   Pulse (!) 54   Temp 98.6 F (37 C) (Oral)   Resp 17   Ht 5\' 4"  (1.626 m)   Wt 70.3 kg   SpO2 98%   BMI 26.61 kg/m   2:16 PM Emergency department evaluation has been reassuring.  Patient with 2 negative troponins.  Patient discussed with and seen by Dr. Laverta Baltimore.  I discussed the patient case and reviewed EKGs with Dr. Gasper Sells by phone.  EKG changes are stable, likely due to the patient's known apical HCM.  Reassured by vital signs and negative troponins.  If patient is feeling better, they are comfortable with her being discharged home.  I went and updated patient on results.  She states that she is feeling better.  She is comfortable with discharge.  She will continue conservative measures at home for her cough and congestion.  I will prescribe a course of Tessalon for her to try as well.  Patient urged to return with worsening symptoms or other concerns. Patient verbalized understanding and agrees with plan.      MDM Rules/Calculators/A&P                          Cough and congestion: Chest x-ray without signs of pneumonia.  Covid/flu testing is negative.  No hypoxia.  Low concern for PE, ACS, CHF.  Chest pain: Atypical in setting of recent coughing spells.  Troponin negative x2.  EKG is markedly abnormal, however stable.  Reviewed with on-call cardiology today.  Low concern for MI today.  Patient to follow-up with her physicians as planned.   Final Clinical Impression(s) / ED Diagnoses Final  diagnoses:  Upper respiratory infection with cough and congestion  Precordial pain    Rx / DC Orders ED Discharge Orders         Ordered    benzonatate (TESSALON) 100 MG capsule  Every 8 hours        04/09/21 1414           Carlisle Cater, PA-C 04/09/21 1419    Long, W[facility], MD 04/11/21 [phone]

## 2021-04-09 NOTE — ED Triage Notes (Signed)
Pt arrived via POV for productive cough w/clear sputum, head congestion and midsternal chest pain that radiates to left side of neckx3 days. Pt denies N/V, SOB. Pt has non labored breathing. 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 Pt is A&Ox4.

## 2021-04-09 NOTE — ED Provider Notes (Signed)
[place]   MRN: [id number] DOB: 28-Aug-1941  Subjective:   Heidi Wolf is a 80 y.o. female with past medical history of CAD status post PCI July 2017, type 2 diabetes, dyslipidemia, essential hypertension presenting for 1 day history of acute onset moderate to severe mid to left-sided chest pain.  Patient states the last episode was this morning when she woke up, rated an 8 out of 10.  Currently she rates it a 6 out of 10.  She did take some aspirin this morning.  Patient also had episodes of diaphoresis, intermittent neck pain yesterday and today with her chest pain.  No current facility-administered medications for this encounter.  Current Outpatient Medications:  .  ACCU-CHEK SOFTCLIX LANCETS lancets, Daily as needed., Disp: 100 each, Rfl: 3 .  amLODipine (NORVASC) 10 MG tablet, TAKE 1 TABLET(10 MG) BY MOUTH DAILY, Disp: 90 tablet, Rfl: 3 .  aspirin EC 81 MG tablet, Take 1 tablet (81 mg total) by mouth daily., Disp: , Rfl:  .  benazepril (LOTENSIN) 20 MG tablet, TAKE 1 TABLET(20 MG) BY MOUTH DAILY, Disp: 90 tablet, Rfl: 3 .  CHOLECALCIFEROL PO, Take 1,000 Units by mouth., Disp: , Rfl:  .  glucose blood (ACCU-CHEK AVIVA PLUS) test strip, Check blood sugar up to once daily as needed., Disp: 100 each, Rfl: 3 .  LUMIGAN 0.01 % SOLN, Place 1 drop into both eyes at bedtime. , Disp: , Rfl:  .  metoprolol tartrate (LOPRESSOR) 25 MG tablet, TAKE 1/2 TABLET BY MOUTH AM AND 1 TABLET BY MOUTH EVERY NIGHT AT BEDTIME, Disp: 135 tablet, Rfl: 3 .  nitroGLYCERIN (NITROSTAT) 0.4 MG SL tablet, Place 1 tablet (0.4 mg total) under the tongue every 5 (five) minutes as needed for chest pain., Disp: 75 tablet, Rfl: 3 .  pantoprazole (PROTONIX) 40 MG tablet, TAKE 1 TABLET(40 MG) BY MOUTH DAILY AS NEEDED, Disp: 90 tablet, Rfl: 1 .  polyethylene glycol powder (GLYCOLAX/MIRALAX) 17 GM/SCOOP powder, Take 17 g by mouth daily as needed., Disp: 289 g, Rfl: 1 .  potassium chloride SA  (KLOR-CON) 20 MEQ tablet, Take 1 tablet (20 mEq total) by mouth daily., Disp: 90 tablet, Rfl: 3 .  rosuvastatin (CRESTOR) 40 MG tablet, Take 1 tablet (40 mg total) by mouth daily., Disp: 90 tablet, Rfl: 3 .  trolamine salicylate (ASPERCREME) 10 % cream, Apply 1 application topically as needed for muscle pain., Disp: , Rfl:    Allergies  Allergen Reactions  . Aspirin Other (See Comments)    Makes stomach burn    Past Medical History:  Diagnosis Date  . Allergy   . Anemia   . Anxiety   . Arthritis    "right hip" (09/21/2014)  . CAD (coronary artery disease)    PCI 90% diagonal stenosis witha DES July 2017.  Nonobstructive disease elsewhere.   . Cataracts, both eyes   . Colon polyps    Hyperplastic 2003  . External hemorrhoid   . GERD (gastroesophageal reflux disease)   . Glaucoma   . Hyperlipidemia   . Hypertension   . Internal hemorrhoid   . Knee effusion, right 03/05/2020  . Myocardial infarction ([place])   . Palpitations 09/03/2016   Hol[place] Aug 2017- PACs, no arrhythmia.   . Personal history of colonic polyps 03/03/2012  . Type II diabetes mellitus ([place])    type 2  . Whiplash injury, acute, sequela 03/05/2020     Past Surgical History:  Procedure Laterality Date  . ABDOMINAL HYSTERECTOMY    .  APPENDECTOMY     "w/gallbladder"  . CARDIAC CATHETERIZATION  X 2  . CARDIAC CATHETERIZATION N/A 07/03/2016   Procedure: Left Heart Cath and Coronary Angiography;  Surgeon: Dixie Dials, MD;  Location: [place] [facility];  Service: Cardiovascular;  Laterality: N/A;  . CARDIAC CATHETERIZATION N/A 07/03/2016   Procedure: Coronary Stent Intervention;  Surgeon: Jettie Booze, MD;  Location: [facility];  Service: Cardiovascular;  Laterality: N/A;  . CARDIAC CATHETERIZATION N/A 07/15/2016   Procedure: Left Heart Cath and Coronary Angiography;  Surgeon: Dixie Dials, MD;  Location: [place];  Service: Cardiovascular;  Laterality: N/A;  . CHOLECYSTECTOMY    . COLONOSCOPY    .  CORONARY ANGIOPLASTY WITH STENT PLACEMENT  06/2016  . TUBAL LIGATION  1971    Family History  Problem Relation Age of Onset  . Diabetes Mother   . Arthritis Mother   . CAD Mother 53       CABG  . CAD Brother   . CAD Sister 70       CABG  . Stroke Brother   . Stroke Daughter   . Diabetes Maternal Grandmother   . Stroke Maternal Grandmother   . Stroke Father   . Heart failure Sister   . Kidney failure Sister   . Breast cancer Other   . Colon cancer Neg Hx   . Stomach cancer Neg Hx   . Esophageal cancer Neg Hx   . Pancreatic cancer Neg Hx   . Rectal cancer Neg Hx     Social History   Tobacco Use  . Smoking status: Never Smoker  . Smokeless tobacco: Never Used  Vaping Use  . Vaping Use: Never used  Substance Use Topics  . Alcohol use: No  . Drug use: No    ROS   Objective:   Vitals: BP (!) 147/62 (BP Location: Right Arm)   Pulse 60   Temp 98.1 F (36.7 C) (Oral)   Resp 18   SpO2 100%   Physical Exam Constitutional:      General: She is not in acute distress.    Appearance: Normal appearance. She is well-developed and normal weight. She is not ill-appearing, toxic-appearing or diaphoretic.  HENT:     Head: Normocephalic and atraumatic.     Right Ear: External ear normal.     Left Ear: External ear normal.     Nose: Nose normal.     Mouth/Throat:     Mouth: Mucous membranes are moist.     Pharynx: Oropharynx is clear.  Eyes:     General: No scleral icterus.    Extraocular Movements: Extraocular movements intact.     Pupils: Pupils are equal, round, and reactive to light.  Cardiovascular:     Rate and Rhythm: Normal rate and regular rhythm.     Pulses: Normal pulses.     Heart sounds: Normal heart sounds. No murmur heard. No friction rub. No gallop.   Pulmonary:     Effort: Pulmonary effort is normal. No respiratory distress.     Breath sounds: Normal breath sounds. No stridor. No wheezing, rhonchi or rales.  Abdominal:     General: Bowel sounds  are normal. There is no distension.     Palpations: Abdomen is soft. There is no mass.     Tenderness: There is no abdominal tenderness. There is no right CVA tenderness, left CVA tenderness, guarding or rebound.  Skin:    General: Skin is warm and dry.  Coloration: Skin is not pale.     Findings: No rash.  Neurological:     General: No focal deficit present.     Mental Status: She is alert and oriented to person, place, and time.  Psychiatric:        Mood and Affect: Mood normal.        Behavior: Behavior normal.        Thought Content: Thought content normal.        Judgment: Judgment normal.     ED ECG REPORT   Date: 04/09/2021  Rate: 52bpm  Rhythm: sinus bradycardia  QRS Axis: normal  Intervals: normal  ST/T Wave abnormalities: nonspecific T wave changes  Conduction Disutrbances:none  Narrative Interpretation: Sinus bradycardia with nonspecific T wave inversion in lead I-aVF, V3-V6.  EKG from today is very comparable to previous EKG from this past February.  Old EKG Reviewed: unchanged  I have personally reviewed the EKG tracing and agree with the computerized printout as noted.   Assessment and Plan :   PDMP not reviewed this encounter.  1. Unstable angina ([place])     Patient has a history of heart disease status post PCI, HL, HTN presenting with moderate to severe chest pain that needs further work-up and rule out of heart event.  We discussed transportation to the hospital by EMS but patient prefers to go by personal vehicle with her daughter driving.  I discussed risks with her daughter, verbalizes understanding, contracts for safety and will take her to the emergency room now.   Jaynee Eagles, [place] 04/09/21 [phone]

## 2021-04-09 NOTE — Discharge Instructions (Signed)
Please read and follow all provided instructions.  Your diagnoses today include:  1. Upper respiratory infection with cough and congestion   2. Precordial pain     Tests performed today include:  An EKG of your heart - is abnormal but stable  A chest x-ray - no sign of pneumonia  Cardiac enzymes - a blood test for heart muscle damage do not show sign of heart attack or stress on the heart  Blood counts and electrolytes  Vital signs. See below for your results today.   Medications prescribed:   Tessalon Perles - cough suppressant medication  Take any prescribed medications only as directed.  Follow-up instructions: Please follow-up with your primary care provider as soon as you can for further evaluation of your symptoms.   Return instructions:  SEEK IMMEDIATE MEDICAL ATTENTION IF:  You have severe chest pain, especially if the pain is crushing or pressure-like and spreads to the arms, back, neck, or jaw, or if you have sweating, nausea (feeling sick to your stomach), or shortness of breath. THIS IS AN EMERGENCY. Don't wait to see if the pain will go away. Get medical help at once. Call 911 or [phone] (operator). DO NOT drive yourself to the hospital.   Your chest pain gets worse and does not go away with rest.   You have an attack of chest pain lasting longer than usual, despite rest and treatment with the medications your caregiver has prescribed.   You wake from sleep with chest pain or shortness of breath.  You feel dizzy or faint.  You have chest pain not typical of your usual pain for which you originally saw your caregiver.   You have any other emergent concerns regarding your health.  Additional Information: Chest pain comes from many different causes. Your caregiver has diagnosed you as having chest pain that is not specific for one problem, but does not require admission.  You are at low risk for an acute heart condition or other serious illness.   Your vital signs  today were: BP (!) 132/56   Pulse (!) 48   Temp 98.6 F (37 C) (Oral)   Resp 19   Ht 5\' 4"  (1.626 m)   Wt 70.3 kg   SpO2 96%   BMI 26.61 kg/m  If your blood pressure (BP) was elevated above 135/85 this visit, please have this repeated by your doctor within one month. --------------

## 2021-04-09 NOTE — ED Triage Notes (Signed)
Pt reports since Sunday . Pt has had CP,cough and Pt feels heart beat fast.

## 2021-04-10 ENCOUNTER — Ambulatory Visit: Payer: Medicare Other

## 2021-04-15 ENCOUNTER — Other Ambulatory Visit: Payer: Self-pay | Admitting: *Deleted

## 2021-04-16 MED ORDER — ACCU-CHEK AVIVA PLUS VI STRP: ORAL_STRIP | 3 refills | Status: DC

## 2021-04-17 ENCOUNTER — Other Ambulatory Visit: Payer: Self-pay

## 2021-04-17 ENCOUNTER — Other Ambulatory Visit: Payer: Self-pay | Admitting: Cardiology

## 2021-04-17 DIAGNOSIS — I10 Essential (primary) hypertension: Secondary | ICD-10-CM

## 2021-04-18 MED ORDER — ACCU-CHEK AVIVA PLUS VI STRP: ORAL_STRIP | 3 refills | Status: DC

## 2021-04-18 MED ORDER — ACCU-CHEK SOFTCLIX LANCETS MISC: 3 refills | Status: DC

## 2021-05-06 ENCOUNTER — Encounter: Payer: Medicare Other | Attending: Physical Medicine & Rehabilitation | Admitting: Physical Medicine & Rehabilitation

## 2021-05-06 ENCOUNTER — Other Ambulatory Visit: Payer: Self-pay

## 2021-05-06 ENCOUNTER — Encounter: Payer: Self-pay | Admitting: Physical Medicine & Rehabilitation

## 2021-05-06 VITALS — BP 157/77 | HR 55 | Temp 98.6°F | Ht 64.0 in | Wt 159.0 lb

## 2021-05-06 DIAGNOSIS — E785 Hyperlipidemia, unspecified: Secondary | ICD-10-CM | POA: Diagnosis not present

## 2021-05-06 DIAGNOSIS — M6530 Trigger finger, unspecified finger: Secondary | ICD-10-CM | POA: Diagnosis not present

## 2021-05-06 DIAGNOSIS — M65842 Other synovitis and tenosynovitis, left hand: Secondary | ICD-10-CM

## 2021-05-06 HISTORY — DX: Trigger finger, unspecified finger: M65.30

## 2021-05-06 HISTORY — DX: Other synovitis and tenosynovitis, left hand: M65.842

## 2021-05-06 LAB — LIPID PANEL
Chol/HDL Ratio: 2.3 ratio (ref 0.0–4.4)
Cholesterol, Total: 116 mg/dL (ref 100–199)
HDL: 51 mg/dL (ref >39)
LDL Chol Calc (NIH): 52 mg/dL (ref 0–99)
Triglycerides: 59 mg/dL (ref 0–149)
VLDL Cholesterol Cal: 13 mg/dL (ref 5–40)

## 2021-05-06 LAB — HEPATIC FUNCTION PANEL
ALT: 15 IU/L (ref 0–32)
AST: 20 IU/L (ref 0–40)
Albumin: 4.6 g/dL (ref 3.7–4.7)
Alkaline Phosphatase: 78 IU/L (ref 44–121)
Bilirubin Total: 0.5 mg/dL (ref 0.0–1.2)
Bilirubin, Direct: 0.15 mg/dL (ref 0.00–0.40)
Total Protein: 7.5 g/dL (ref 6.0–8.5)

## 2021-05-06 NOTE — Progress Notes (Signed)
Stenosing Tenosynovitis injection  Indication: Trigger finger pain not relieved by conservative care.  Informed consent was obtained after describing risks and benefits of the procedure with the patient, this includes bleeding, bruising, infection and medication side effects. The patient wishes to proceed and has given written consent. Patient was placed in a seated position with volar hand facing up. The left third digit A1 pulley was marked and prepped with betadine. Vapocoolant spray was applied. A 30-gauge 1/2 inch needle was inserted, directed into the tendon and then slightly withdrawn.  After negative draw back for blood, a solution containing 0.25 mL of 6 mg per ML celestone and 0.25 mL of 1% lidocaine was injected. A band aid was applied. The patient tolerated the procedure well. Post procedure instructions were given.

## 2021-06-10 ENCOUNTER — Ambulatory Visit (INDEPENDENT_AMBULATORY_CARE_PROVIDER_SITE_OTHER): Payer: Medicare Other | Admitting: Family Medicine

## 2021-06-10 ENCOUNTER — Other Ambulatory Visit: Payer: Self-pay

## 2021-06-10 ENCOUNTER — Encounter: Payer: Self-pay | Admitting: Family Medicine

## 2021-06-10 VITALS — BP 128/64 | HR 53 | Ht 64.0 in | Wt 160.0 lb

## 2021-06-10 DIAGNOSIS — I10 Essential (primary) hypertension: Secondary | ICD-10-CM

## 2021-06-10 DIAGNOSIS — I2510 Atherosclerotic heart disease of native coronary artery without angina pectoris: Secondary | ICD-10-CM | POA: Diagnosis not present

## 2021-06-10 DIAGNOSIS — Z9861 Coronary angioplasty status: Secondary | ICD-10-CM

## 2021-06-10 DIAGNOSIS — I200 Unstable angina: Secondary | ICD-10-CM | POA: Diagnosis not present

## 2021-06-10 DIAGNOSIS — E785 Hyperlipidemia, unspecified: Secondary | ICD-10-CM

## 2021-06-10 DIAGNOSIS — M6530 Trigger finger, unspecified finger: Secondary | ICD-10-CM | POA: Diagnosis not present

## 2021-06-10 DIAGNOSIS — E1159 Type 2 diabetes mellitus with other circulatory complications: Secondary | ICD-10-CM | POA: Diagnosis not present

## 2021-06-10 LAB — POCT GLYCOSYLATED HEMOGLOBIN (HGB A1C): HbA1c, POC (controlled diabetic range): 6.3 % (ref 0.0–7.0)

## 2021-06-10 NOTE — Assessment & Plan Note (Signed)
Well-controlled on Norvasc, Lotensin, and Lopressor.  Continue as is.

## 2021-06-10 NOTE — Patient Instructions (Signed)
I am so happy I could see you one more time before I leave! Keep up the great work.

## 2021-06-10 NOTE — Assessment & Plan Note (Signed)
Recent CP episode in 03/2021 with negative ED work-up, atypical and likely related to her coughing at that time.  No further chest pain.  Continue follow-up with cardiology yearly or sooner if recurrent CP.

## 2021-06-10 NOTE — Assessment & Plan Note (Addendum)
A1c 6.3% today, slightly elevated from previous at 5.7% however overall well controlled via diet only which has been longstanding for the past several years.  Will recheck in the next 3-6 months.  She is aware of necessity for updated ophthalmology exam.

## 2021-06-10 NOTE — Assessment & Plan Note (Signed)
Secondary prevention.  Tolerating Crestor well with improved LDL in 04/2021.

## 2021-06-10 NOTE — Assessment & Plan Note (Signed)
Much improved s/p injection with PM&R.

## 2021-06-10 NOTE — Progress Notes (Signed)
    SUBJECTIVE:   CHIEF COMPLAINT / HPI: Check in   Heidi Wolf is an 80 year old female presenting for check-in.  She usually follows up every 6 months, however followed up sooner as I will be leaving at the end of June.  She has no concerns today.  Reports she has done well with her diet, recently celebrated her 80th birthday and had cake leftover for a while.  She still struggles with her osteoarthritis every once in a while, but does very well with her Aspercreme and following with PM&R.  She recently had a trigger finger injection that has been very helpful, reduced the amount of times her finger will click.  States she was also seen in the ED in 03/2021 due to chest pain.  Negative troponin work-up, EKG abnormal but similar to previous.  She has had no further chest pain since this evaluation.  PERTINENT  PMH / PSH: CAD with PCI in 06/2016, type 2 diabetes diet controlled with A1c consistently under 6%, hypertension, osteoarthritis, trigger finger  OBJECTIVE:   BP 128/64   Pulse (!) 53   Ht 5\' 4"  (1.626 m)   Wt 160 lb (72.6 kg)   SpO2 96%   BMI 27.46 kg/m   General: Alert, NAD HEENT: NCAT, MMM Cardiac: RRR , 2/6 systolic murmur present Lungs: Clear bilaterally, no increased WOB  Msk: Normal gait  Ext: Warm, dry, 2+ distal pulses  ASSESSMENT/PLAN:   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HCC) A1c 6.3% today, slightly elevated from previous at 5.7% however overall well controlled via diet only which has been longstanding for the past several years.  Will recheck in the next 3-6 months.  She is aware of necessity for updated ophthalmology exam.  CAD- S/P PCI July 2017 Recent CP episode in 03/2021 with negative ED work-up, atypical and likely related to her coughing at that time.  No further chest pain.  Continue follow-up with cardiology yearly or sooner if recurrent CP.  Essential hypertension Well-controlled on Norvasc, Lotensin, and Lopressor.  Continue as  is.  Dyslipidemia Secondary prevention.  Tolerating Crestor well with improved LDL in 04/2021.  Trigger finger, left Much improved s/p injection with PM&R.     Follow-up in approximately 6 months or sooner if needed.  [name], [place]

## 2021-06-11 ENCOUNTER — Encounter: Payer: Self-pay | Admitting: Family Medicine

## 2021-06-24 ENCOUNTER — Other Ambulatory Visit: Payer: Self-pay | Admitting: Family Medicine

## 2021-06-24 DIAGNOSIS — K219 Gastro-esophageal reflux disease without esophagitis: Secondary | ICD-10-CM

## 2021-06-25 DIAGNOSIS — H401131 Primary open-angle glaucoma, bilateral, mild stage: Secondary | ICD-10-CM | POA: Diagnosis not present

## 2021-07-02 ENCOUNTER — Encounter: Payer: Self-pay | Admitting: Physical Medicine & Rehabilitation

## 2021-07-02 ENCOUNTER — Other Ambulatory Visit: Payer: Self-pay

## 2021-07-02 ENCOUNTER — Encounter: Payer: Medicare Other | Attending: Physical Medicine & Rehabilitation | Admitting: Physical Medicine & Rehabilitation

## 2021-07-02 VITALS — BP 148/69 | HR 54 | Temp 98.7°F | Ht 64.0 in | Wt 160.0 lb

## 2021-07-02 DIAGNOSIS — M1711 Unilateral primary osteoarthritis, right knee: Secondary | ICD-10-CM

## 2021-07-02 NOTE — Patient Instructions (Signed)
Continue to use voltaren gel on hands and RIght knee

## 2021-07-02 NOTE — Progress Notes (Signed)
Knee injection RIght suprapatellar bursa With ultrasound guidance)  Indication: Right knee pain not relieved by medication management and other conservative care.  Informed consent was obtained after describing risks and benefits of the procedure with the patient, this includes bleeding, bruising, infection and medication side effects. The patient wishes to proceed and has given written consent. The patient was placed in a recumbent position.  Right supra patellar area was scanned a small effusion was noted just superior to the patella, area was marked and prepped with Betadine and alcohol. It was then entered with a 25-gauge 1-1/2 inch needle and 1 mL of 1% lidocaine was injected into the skin and subcutaneous tissue. Then another 21-gauge 2 inch needle was inserted into the suprapatellar bursa under direct long axis views targeting the suprapatellar effusion.  After negative draw back for blood, a solution Zilretta, 32 mg of triamcinolone, 5 cc solution was injected. The patient tolerated the procedure well. Post procedure instructions were given.

## 2021-08-12 DIAGNOSIS — H524 Presbyopia: Secondary | ICD-10-CM | POA: Diagnosis not present

## 2021-09-26 DIAGNOSIS — H401131 Primary open-angle glaucoma, bilateral, mild stage: Secondary | ICD-10-CM | POA: Diagnosis not present

## 2021-09-30 ENCOUNTER — Emergency Department (HOSPITAL_COMMUNITY)
Admission: EM | Admit: 2021-09-30 | Discharge: 2021-09-30 | Disposition: A | Discharge status: Home / Self Care | Payer: Medicare Other | Attending: Emergency Medicine | Admitting: Emergency Medicine

## 2021-09-30 ENCOUNTER — Emergency Department (HOSPITAL_COMMUNITY): Payer: Medicare Other

## 2021-09-30 ENCOUNTER — Telehealth: Payer: Self-pay | Admitting: Cardiology

## 2021-09-30 ENCOUNTER — Telehealth: Payer: Self-pay

## 2021-09-30 ENCOUNTER — Encounter (HOSPITAL_COMMUNITY): Payer: Self-pay | Admitting: Emergency Medicine

## 2021-09-30 DIAGNOSIS — Z7982 Long term (current) use of aspirin: Secondary | ICD-10-CM | POA: Diagnosis not present

## 2021-09-30 DIAGNOSIS — M5431 Sciatica, right side: Secondary | ICD-10-CM

## 2021-09-30 DIAGNOSIS — R202 Paresthesia of skin: Secondary | ICD-10-CM | POA: Diagnosis not present

## 2021-09-30 DIAGNOSIS — I10 Essential (primary) hypertension: Secondary | ICD-10-CM | POA: Diagnosis not present

## 2021-09-30 DIAGNOSIS — E785 Hyperlipidemia, unspecified: Secondary | ICD-10-CM | POA: Diagnosis not present

## 2021-09-30 DIAGNOSIS — R9431 Abnormal electrocardiogram [ECG] [EKG]: Secondary | ICD-10-CM | POA: Diagnosis not present

## 2021-09-30 DIAGNOSIS — Z79899 Other long term (current) drug therapy: Secondary | ICD-10-CM | POA: Insufficient documentation

## 2021-09-30 DIAGNOSIS — I639 Cerebral infarction, unspecified: Secondary | ICD-10-CM | POA: Diagnosis not present

## 2021-09-30 DIAGNOSIS — I2510 Atherosclerotic heart disease of native coronary artery without angina pectoris: Secondary | ICD-10-CM | POA: Insufficient documentation

## 2021-09-30 DIAGNOSIS — M5412 Radiculopathy, cervical region: Secondary | ICD-10-CM | POA: Diagnosis not present

## 2021-09-30 DIAGNOSIS — M542 Cervicalgia: Secondary | ICD-10-CM | POA: Diagnosis present

## 2021-09-30 DIAGNOSIS — R001 Bradycardia, unspecified: Secondary | ICD-10-CM | POA: Insufficient documentation

## 2021-09-30 DIAGNOSIS — E1169 Type 2 diabetes mellitus with other specified complication: Secondary | ICD-10-CM | POA: Diagnosis not present

## 2021-09-30 LAB — COMPREHENSIVE METABOLIC PANEL
ALT: 23 U/L (ref 0–44)
AST: 19 U/L (ref 15–41)
Albumin: 3.9 g/dL (ref 3.5–5.0)
Alkaline Phosphatase: 52 U/L (ref 38–126)
Anion gap: 8 (ref 5–15)
BUN: 18 mg/dL (ref 8–23)
CO2: 25 mmol/L (ref 22–32)
Calcium: 9.4 mg/dL (ref 8.9–10.3)
Chloride: 104 mmol/L (ref 98–111)
Creatinine, Ser: 1.09 mg/dL — ABNORMAL HIGH (ref 0.44–1.00)
GFR, Estimated: 51 mL/min — ABNORMAL LOW (ref >60)
Glucose, Bld: 109 mg/dL — ABNORMAL HIGH (ref 70–99)
Potassium: 3.9 mmol/L (ref 3.5–5.1)
Sodium: 137 mmol/L (ref 135–145)
Total Bilirubin: 0.4 mg/dL (ref 0.3–1.2)
Total Protein: 7.3 g/dL (ref 6.5–8.1)

## 2021-09-30 LAB — CBC
HCT: 41.1 % (ref 36.0–46.0)
Hemoglobin: 13.2 g/dL (ref 12.0–15.0)
MCH: 26.6 pg (ref 26.0–34.0)
MCHC: 32.1 g/dL (ref 30.0–36.0)
MCV: 82.7 fL (ref 80.0–100.0)
Platelets: 252 10*3/uL (ref 150–400)
RBC: 4.97 MIL/uL (ref 3.87–5.11)
RDW: 13.9 % (ref 11.5–15.5)
WBC: 9.5 10*3/uL (ref 4.0–10.5)
nRBC: 0 % (ref 0.0–0.2)

## 2021-09-30 LAB — DIFFERENTIAL
Abs Immature Granulocytes: 0.02 10*3/uL (ref 0.00–0.07)
Basophils Absolute: 0 10*3/uL (ref 0.0–0.1)
Basophils Relative: 0 %
Eosinophils Absolute: 0 10*3/uL (ref 0.0–0.5)
Eosinophils Relative: 0 %
Immature Granulocytes: 0 %
Lymphocytes Relative: 23 %
Lymphs Abs: 2.2 10*3/uL (ref 0.7–4.0)
Monocytes Absolute: 0.6 10*3/uL (ref 0.1–1.0)
Monocytes Relative: 6 %
Neutro Abs: 6.7 10*3/uL (ref 1.7–7.7)
Neutrophils Relative %: 71 %

## 2021-09-30 LAB — URINALYSIS, ROUTINE W REFLEX MICROSCOPIC
Bilirubin Urine: NEGATIVE
Glucose, UA: NEGATIVE mg/dL
Hgb urine dipstick: NEGATIVE
Ketones, ur: NEGATIVE mg/dL
Leukocytes,Ua: NEGATIVE
Nitrite: NEGATIVE
Protein, ur: NEGATIVE mg/dL
Specific Gravity, Urine: 1.017 (ref 1.005–1.030)
pH: 5 (ref 5.0–8.0)

## 2021-09-30 LAB — PROTIME-INR
INR: 1 (ref 0.8–1.2)
Prothrombin Time: 12.9 seconds (ref 11.4–15.2)

## 2021-09-30 LAB — TROPONIN I (HIGH SENSITIVITY): Troponin I (High Sensitivity): 12 ng/L (ref <18)

## 2021-09-30 LAB — APTT: aPTT: 28 seconds (ref 24–36)

## 2021-09-30 MED ORDER — GABAPENTIN 100 MG PO CAPS: 100.0000 mg | ORAL_CAPSULE | Freq: Two times a day (BID) | ORAL | 0 refills | Status: DC | PRN

## 2021-09-30 NOTE — ED Provider Notes (Signed)
Emergency Medicine Provider Triage Evaluation Note  Heidi Wolf , a 80 y.o. female  was evaluated in triage.  Pt complains of right-sided tingling and pain that has been ongoing for a long time.  She sees physical rehab for this.  She states that the tingling and pain was worse over the weekend.  She had some heaviness this morning.  She reports tingling in her right face, right arm, right leg.  She is able to ambulate. Patient denies signs of stroke including: facial droop, slurred speech, aphasia, imbalance/trouble walking.   Review of Systems  Positive: Paresthesias Negative: Fevers  Physical Exam  There were no vitals taken for this visit. Gen:   Awake, no distress   Resp:  Normal effort  MSK:   Moves extremities without difficulty  Other:  No pronator drift, no slurred speech, upper and lower extremity strength symmetric bilaterally, cranial nerves II through XII grossly intact  Medical Decision Making  Medically screening exam initiated at 12:45 PM.  Appropriate orders placed.  [name] was informed that the remainder of the evaluation will be completed by another provider, this initial triage assessment does not replace that evaluation, and the importance of remaining in the ED until their evaluation is complete.     Carlisle Cater, PA-C 09/30/21 1247    Milton Ferguson, MD 10/05/21 1155

## 2021-09-30 NOTE — Discharge Instructions (Addendum)
Your work-up in the ER today was reassuring overall.  Specifically we did not see signs of a large stroke, heart attack, or other emergency condition.  I felt that your symptoms could be related to a pinched nerve or slipped disc in your neck, which can cause numbness and pain radiating down your right arm.  I encouraged you to call your primary care doctor to discuss these symptoms and try to arrange for an outpatient MRI of your cervical spine.  We talked about the small possibility that your symptoms may be related to a stroke or mini-stroke (TIA).  The only way to check for this is with an MRI. You did not wish to wait longer in the ER for an MRI.  However, I felt your risk of stroke was low with these symptoms, and it was reasonable to discharge you home with your family. Our social worker will try to arrange for home health to come to the house to check on you as well.  However, if you develop new symptoms, including facial droop, difficulty speaking, sudden confusion, loss of vision, sudden loss of balance, severe headache, or weakness or new loss of feeling in your arms or legs, please call 911 to come back to the ER.  These may be signs of a stroke.  Continue taking the baby aspirin daily as you've been doing.  You can take tylenol for pain.  I prescribed some gabapentin to use AS NEEDED for pain if tylenol is not enough.

## 2021-09-30 NOTE — Telephone Encounter (Signed)
Patient calls nurse line reporting of right sided weakness since yesterday. Patient reports a "tingling" sensation in her mouth, however denies any facial droopiness. Patient denies chest pain, SOB, or slurred speech. Patient reports she overall doesn't feel right and more tired than usual. Patient advised to go to the ED or UC to be evaluated. Patient verbalized understanding.

## 2021-09-30 NOTE — ED Triage Notes (Signed)
Patient complains of right sided numbness that started yesterday and right sided weakness that was present when the patient woke this morning. Grip strength equal bilaterally, no arm drift, speech is clear, patient is alert and oriented.

## 2021-09-30 NOTE — Telephone Encounter (Signed)
Returned call to pt she states that she has not been able to get a hold of her PCP. She states that she "just took" her medications. She also states that she does not have a BP cuff. She states that she woke up with numbness to her right hand/neck/shoulder and arm numbness and tingling. She had this issue yesterday also. Denies any pain or any other sx, chest pain, sweating, etc she is not slurring. She states that she does not feel like her heart is racing. Informed pt that if this continues she needs to go to the ER. She will need to discuss this with her PCP, verbalizes understanding. I transferred pt to her PCP's office. She is grateful and thankful for the call.

## 2021-09-30 NOTE — Care Management (Signed)
ED RN Care Manager noted that this patient may benefit from from [facility] services.  Patient  presents to the ED with complains of weakness.  Patient has hx of CVA, contacted her PCP and was advised to come to the ED. ED evaluation resulted in no significant finding. Discussed with patient and son about [facility] services they are agreeable, without preferences. Sent referral to [facility] accepted referral. Updated patient that a nurse will contact them by phone to schedule the initial home visit.
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
## 2021-09-30 NOTE — Telephone Encounter (Signed)
Patient called this morning and states she has a tingling feeling in her R hand and it feels numb. She is not sure what to do. She can not reach her PCP

## 2021-10-02 ENCOUNTER — Encounter: Payer: Self-pay | Admitting: Family Medicine

## 2021-10-02 ENCOUNTER — Other Ambulatory Visit: Payer: Self-pay

## 2021-10-02 ENCOUNTER — Ambulatory Visit (INDEPENDENT_AMBULATORY_CARE_PROVIDER_SITE_OTHER): Payer: Medicare Other | Admitting: Family Medicine

## 2021-10-02 VITALS — BP 159/71 | HR 61 | Ht 64.0 in | Wt 164.4 lb

## 2021-10-02 DIAGNOSIS — G8191 Hemiplegia, unspecified affecting right dominant side: Secondary | ICD-10-CM

## 2021-10-02 DIAGNOSIS — I2510 Atherosclerotic heart disease of native coronary artery without angina pectoris: Secondary | ICD-10-CM

## 2021-10-02 DIAGNOSIS — Z9861 Coronary angioplasty status: Secondary | ICD-10-CM

## 2021-10-02 DIAGNOSIS — E1159 Type 2 diabetes mellitus with other circulatory complications: Secondary | ICD-10-CM | POA: Diagnosis not present

## 2021-10-02 DIAGNOSIS — M5090 Cervical disc disorder, unspecified, unspecified cervical region: Secondary | ICD-10-CM

## 2021-10-02 DIAGNOSIS — I200 Unstable angina: Secondary | ICD-10-CM | POA: Diagnosis not present

## 2021-10-02 DIAGNOSIS — Z23 Encounter for immunization: Secondary | ICD-10-CM

## 2021-10-02 HISTORY — DX: Hemiplegia, unspecified affecting right dominant side: G81.91

## 2021-10-02 LAB — POCT GLYCOSYLATED HEMOGLOBIN (HGB A1C): HbA1c, POC (controlled diabetic range): 6 % (ref 0.0–7.0)

## 2021-10-02 NOTE — Assessment & Plan Note (Signed)
Suspect most of her acute symptoms are from cervical radiculopathy.

## 2021-10-02 NOTE — Patient Instructions (Addendum)
I want the nurse to come out and check on you.  I also want you to have physical therapy.  Tell the nurse that I am happy to sign orders.   I think the big problem is arthritis with a pinched nerve in your neck.  I have ordered an MRI of your neck to check on this and they will also do an MRI of your brain at the same time to make sure you have not had a stroke.   Physical therapy is the most important treatment.   I will call with the MRI results.  Likely, I will want you to get started on the physical therapy and see [place] again in 2-3 weeks to make sure you are improving.   Of course, call us if things seem to be getting worse.   No change in the medicines you are taking.   You get a flu shot today.

## 2021-10-02 NOTE — Assessment & Plan Note (Signed)
A1C at goal

## 2021-10-02 NOTE — Assessment & Plan Note (Signed)
I am not sure if this is right hemiparesis or leg and arm radiculopathy.  I suspect she has longstanding leg problems which are gradually worsening.  I suspect she has right cervical radiculopathy which is new.  Of course, she could have had a small CVA on the night of 10/2 which was missed on the CT scan of 10/3.   I suspect the main treatment will be conservative.  I want PT and hope this can be done through the home health nurse tomorrow.

## 2021-10-02 NOTE — Assessment & Plan Note (Signed)
Reviewed last lipid panel and she is at goal.

## 2021-10-02 NOTE — Progress Notes (Signed)
    SUBJECTIVE:   CHIEF COMPLAINT / HPI:   Right sided pain, weakness and tingling.  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  Over the last two weeks, she has had worsening bilateral leg pain and numbness and a feeling of being weak and unsteady.  For one week, she has had right arm tingling and neck tightness.  On the night of 10/2.  She had a marked increase in her right sided weakness and went to ER.  During that visit she had a normal head CT, CMP, CBC, trop, and EKG.  She left after a long delay with instructions to go to her PCP for a MRI of her neck.  The ER social worker apparently arranged a home health RN visit.  They called today and will likely visit tomorrow.   Patient still feels considerable weak on the right side. 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    She believes her thinking is normal.  No change in bowel or bladder. She has known CAD and is on ASA and statin for secondary prevention.    OBJECTIVE:   BP (!) 159/71   Pulse 61   Ht 5\' 4"  (1.626 m)   Wt 164 lb 6.4 oz (74.6 kg)   SpO2 98%   BMI 28.22 kg/m   No facial droop, weakness or numbness Good strength both upper extremities. Gait is broad based and slow.  She feels quite unsteady.  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  I suspect she has longstanding leg problems which are gradually worsening.  I suspect she has right cervical radiculopathy which is new.  Of course, she could have had a small CVA on the night of 10/2 which was missed on the CT scan of 10/3.   I suspect the main treatment will be conservative.  I want PT and hope this can be done through the home health nurse tomorrow.    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HCC) A1C at goal.  CAD- S/P PCI July 2017 Reviewed last lipid panel and she is at goal.  Cervical neck pain with evidence  of disc disease Suspect most of her acute symptoms are from cervical radiculopathy.     Zenia Resides, MD [place]

## 2021-10-03 ENCOUNTER — Ambulatory Visit: Payer: Medicare Other | Admitting: Physical Medicine & Rehabilitation

## 2021-10-03 DIAGNOSIS — E785 Hyperlipidemia, unspecified: Secondary | ICD-10-CM | POA: Diagnosis not present

## 2021-10-03 DIAGNOSIS — M5431 Sciatica, right side: Secondary | ICD-10-CM | POA: Diagnosis not present

## 2021-10-03 DIAGNOSIS — I2510 Atherosclerotic heart disease of native coronary artery without angina pectoris: Secondary | ICD-10-CM | POA: Diagnosis not present

## 2021-10-03 DIAGNOSIS — M5412 Radiculopathy, cervical region: Secondary | ICD-10-CM | POA: Diagnosis not present

## 2021-10-03 DIAGNOSIS — M6530 Trigger finger, unspecified finger: Secondary | ICD-10-CM | POA: Diagnosis not present

## 2021-10-03 DIAGNOSIS — I10 Essential (primary) hypertension: Secondary | ICD-10-CM | POA: Diagnosis not present

## 2021-10-03 DIAGNOSIS — E119 Type 2 diabetes mellitus without complications: Secondary | ICD-10-CM | POA: Diagnosis not present

## 2021-10-03 DIAGNOSIS — Z955 Presence of coronary angioplasty implant and graft: Secondary | ICD-10-CM | POA: Diagnosis not present

## 2021-10-07 ENCOUNTER — Telehealth: Payer: Self-pay

## 2021-10-07 DIAGNOSIS — M5431 Sciatica, right side: Secondary | ICD-10-CM | POA: Diagnosis not present

## 2021-10-07 DIAGNOSIS — I10 Essential (primary) hypertension: Secondary | ICD-10-CM | POA: Diagnosis not present

## 2021-10-07 DIAGNOSIS — M6530 Trigger finger, unspecified finger: Secondary | ICD-10-CM | POA: Diagnosis not present

## 2021-10-07 DIAGNOSIS — I2510 Atherosclerotic heart disease of native coronary artery without angina pectoris: Secondary | ICD-10-CM | POA: Diagnosis not present

## 2021-10-07 DIAGNOSIS — E119 Type 2 diabetes mellitus without complications: Secondary | ICD-10-CM | POA: Diagnosis not present

## 2021-10-07 DIAGNOSIS — M5412 Radiculopathy, cervical region: Secondary | ICD-10-CM | POA: Diagnosis not present

## 2021-10-07 NOTE — Telephone Encounter (Signed)
Heidi Wolf [facility] PT calls nurse line requesting verbal orders for home health PT as follows.   2x a week for 1 week  1x a week for 2 weeks  Every other week   Verbal order given per [facility] protocol.

## 2021-10-08 ENCOUNTER — Encounter: Payer: Medicare Other | Admitting: Physical Medicine & Rehabilitation

## 2021-10-10 DIAGNOSIS — I2510 Atherosclerotic heart disease of native coronary artery without angina pectoris: Secondary | ICD-10-CM | POA: Diagnosis not present

## 2021-10-10 DIAGNOSIS — M5431 Sciatica, right side: Secondary | ICD-10-CM | POA: Diagnosis not present

## 2021-10-10 DIAGNOSIS — M5412 Radiculopathy, cervical region: Secondary | ICD-10-CM | POA: Diagnosis not present

## 2021-10-10 DIAGNOSIS — M6530 Trigger finger, unspecified finger: Secondary | ICD-10-CM | POA: Diagnosis not present

## 2021-10-10 DIAGNOSIS — E119 Type 2 diabetes mellitus without complications: Secondary | ICD-10-CM | POA: Diagnosis not present

## 2021-10-10 DIAGNOSIS — I10 Essential (primary) hypertension: Secondary | ICD-10-CM | POA: Diagnosis not present

## 2021-10-11 DIAGNOSIS — I10 Essential (primary) hypertension: Secondary | ICD-10-CM | POA: Diagnosis not present

## 2021-10-11 DIAGNOSIS — E119 Type 2 diabetes mellitus without complications: Secondary | ICD-10-CM | POA: Diagnosis not present

## 2021-10-11 DIAGNOSIS — M5412 Radiculopathy, cervical region: Secondary | ICD-10-CM | POA: Diagnosis not present

## 2021-10-11 DIAGNOSIS — M6530 Trigger finger, unspecified finger: Secondary | ICD-10-CM | POA: Diagnosis not present

## 2021-10-11 DIAGNOSIS — I2510 Atherosclerotic heart disease of native coronary artery without angina pectoris: Secondary | ICD-10-CM | POA: Diagnosis not present

## 2021-10-11 DIAGNOSIS — M5431 Sciatica, right side: Secondary | ICD-10-CM | POA: Diagnosis not present

## 2021-10-12 ENCOUNTER — Emergency Department (HOSPITAL_COMMUNITY)
Admission: EM | Admit: 2021-10-12 | Discharge: 2021-10-12 | Disposition: A | Discharge status: Home / Self Care | Payer: Medicare Other | Attending: Emergency Medicine | Admitting: Emergency Medicine

## 2021-10-12 ENCOUNTER — Encounter (HOSPITAL_COMMUNITY): Payer: Self-pay | Admitting: Emergency Medicine

## 2021-10-12 ENCOUNTER — Other Ambulatory Visit: Payer: Self-pay

## 2021-10-12 ENCOUNTER — Ambulatory Visit
Admission: RE | Admit: 2021-10-12 | Discharge: 2021-10-12 | Disposition: A | Discharge status: Home / Self Care | Payer: Medicare Other | Source: Ambulatory Visit | Attending: Family Medicine | Admitting: Family Medicine

## 2021-10-12 ENCOUNTER — Emergency Department (HOSPITAL_COMMUNITY): Payer: Medicare Other

## 2021-10-12 DIAGNOSIS — R0789 Other chest pain: Secondary | ICD-10-CM | POA: Diagnosis not present

## 2021-10-12 DIAGNOSIS — I2510 Atherosclerotic heart disease of native coronary artery without angina pectoris: Secondary | ICD-10-CM | POA: Diagnosis not present

## 2021-10-12 DIAGNOSIS — R001 Bradycardia, unspecified: Secondary | ICD-10-CM | POA: Insufficient documentation

## 2021-10-12 DIAGNOSIS — I6782 Cerebral ischemia: Secondary | ICD-10-CM | POA: Diagnosis not present

## 2021-10-12 DIAGNOSIS — M542 Cervicalgia: Secondary | ICD-10-CM | POA: Diagnosis not present

## 2021-10-12 DIAGNOSIS — Z7982 Long term (current) use of aspirin: Secondary | ICD-10-CM | POA: Diagnosis not present

## 2021-10-12 DIAGNOSIS — Z79899 Other long term (current) drug therapy: Secondary | ICD-10-CM | POA: Diagnosis not present

## 2021-10-12 DIAGNOSIS — R079 Chest pain, unspecified: Secondary | ICD-10-CM | POA: Diagnosis not present

## 2021-10-12 DIAGNOSIS — E119 Type 2 diabetes mellitus without complications: Secondary | ICD-10-CM | POA: Insufficient documentation

## 2021-10-12 DIAGNOSIS — I10 Essential (primary) hypertension: Secondary | ICD-10-CM | POA: Insufficient documentation

## 2021-10-12 DIAGNOSIS — M4802 Spinal stenosis, cervical region: Secondary | ICD-10-CM | POA: Diagnosis not present

## 2021-10-12 DIAGNOSIS — M79621 Pain in right upper arm: Secondary | ICD-10-CM | POA: Insufficient documentation

## 2021-10-12 DIAGNOSIS — M5090 Cervical disc disorder, unspecified, unspecified cervical region: Secondary | ICD-10-CM

## 2021-10-12 DIAGNOSIS — G8191 Hemiplegia, unspecified affecting right dominant side: Secondary | ICD-10-CM

## 2021-10-12 DIAGNOSIS — R29818 Other symptoms and signs involving the nervous system: Secondary | ICD-10-CM | POA: Diagnosis not present

## 2021-10-12 LAB — COMPREHENSIVE METABOLIC PANEL
ALT: 22 U/L (ref 0–44)
AST: 33 U/L (ref 15–41)
Albumin: 3.8 g/dL (ref 3.5–5.0)
Alkaline Phosphatase: 59 U/L (ref 38–126)
Anion gap: 10 (ref 5–15)
BUN: 18 mg/dL (ref 8–23)
CO2: 23 mmol/L (ref 22–32)
Calcium: 9.3 mg/dL (ref 8.9–10.3)
Chloride: 106 mmol/L (ref 98–111)
Creatinine, Ser: 0.99 mg/dL (ref 0.44–1.00)
GFR, Estimated: 58 mL/min — ABNORMAL LOW (ref >60)
Glucose, Bld: 96 mg/dL (ref 70–99)
Potassium: 3.6 mmol/L (ref 3.5–5.1)
Sodium: 139 mmol/L (ref 135–145)
Total Bilirubin: 0.5 mg/dL (ref 0.3–1.2)
Total Protein: 7.1 g/dL (ref 6.5–8.1)

## 2021-10-12 LAB — CBC WITH DIFFERENTIAL/PLATELET
Abs Immature Granulocytes: 0.03 10*3/uL (ref 0.00–0.07)
Basophils Absolute: 0 10*3/uL (ref 0.0–0.1)
Basophils Relative: 0 %
Eosinophils Absolute: 0.1 10*3/uL (ref 0.0–0.5)
Eosinophils Relative: 1 %
HCT: 41 % (ref 36.0–46.0)
Hemoglobin: 12.9 g/dL (ref 12.0–15.0)
Immature Granulocytes: 0 %
Lymphocytes Relative: 32 %
Lymphs Abs: 3.2 10*3/uL (ref 0.7–4.0)
MCH: 26.2 pg (ref 26.0–34.0)
MCHC: 31.5 g/dL (ref 30.0–36.0)
MCV: 83.2 fL (ref 80.0–100.0)
Monocytes Absolute: 0.6 10*3/uL (ref 0.1–1.0)
Monocytes Relative: 6 %
Neutro Abs: 6 10*3/uL (ref 1.7–7.7)
Neutrophils Relative %: 61 %
Platelets: 292 10*3/uL (ref 150–400)
RBC: 4.93 MIL/uL (ref 3.87–5.11)
RDW: 14 % (ref 11.5–15.5)
WBC: 9.9 10*3/uL (ref 4.0–10.5)
nRBC: 0 % (ref 0.0–0.2)

## 2021-10-12 LAB — TROPONIN I (HIGH SENSITIVITY)
Troponin I (High Sensitivity): 12 ng/L (ref <18)
Troponin I (High Sensitivity): 12 ng/L (ref <18)

## 2021-10-12 NOTE — ED Triage Notes (Signed)
Pt to triage via [facility] from home.  She has been having neck pain that radiates to R arm.  CT 10/3 and MRI today.  When she got home she took a nap and woke up with burning chest pain and SOB.  Took 2 home NTG.  20g LAC,  EMS gave ASA 324mg  and 1 additional NTG.

## 2021-10-12 NOTE — Discharge Instructions (Signed)
Your work-up in the ED today has been reassuring.  We recommend follow-up with your primary care doctor.  Return for new or concerning symptoms.

## 2021-10-12 NOTE — ED Notes (Signed)
DC instructions reviewed with pt. PT verbalized understanding. PT DC °

## 2021-10-12 NOTE — ED Provider Notes (Signed)
Emergency Medicine Provider Triage Evaluation Note  Heidi Wolf , a 80 y.o. female  was evaluated in triage.  Pt complains of chest pain.  Started today, radiates to the right arm.  Feels like how she felt with prior heart attacks.  Took multiple nitros, did not help..  Review of Systems  Positive: Chest pain Negative: Nausea, vomiting  Physical Exam  BP 134/78 (BP Location: Right Arm)   Pulse 74   Temp 99.3 F (37.4 C) (Oral)   Resp 14   SpO2 99%  Gen:   Awake, no distress   Resp:  Normal effort  MSK:   Moves extremities without difficulty  Other:  Radial pulse 2+ equal bilaterally  Medical Decision Making  Medically screening exam initiated at 2:47 PM.  Appropriate orders placed.  [name] was informed that the remainder of the evaluation will be completed by another provider, this initial triage assessment does not replace that evaluation, and the importance of remaining in the ED until their evaluation is complete.  Chest pain work-up   Sherrill Raring, Hershal Coria 10/12/21 1448    Davonna Belling, MD 10/12/21 [phone]
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
## 2021-10-14 ENCOUNTER — Inpatient Hospital Stay: Payer: Medicare Other | Admitting: Family Medicine

## 2021-10-15 DIAGNOSIS — M5412 Radiculopathy, cervical region: Secondary | ICD-10-CM | POA: Diagnosis not present

## 2021-10-15 DIAGNOSIS — E119 Type 2 diabetes mellitus without complications: Secondary | ICD-10-CM | POA: Diagnosis not present

## 2021-10-15 DIAGNOSIS — M5431 Sciatica, right side: Secondary | ICD-10-CM | POA: Diagnosis not present

## 2021-10-15 DIAGNOSIS — I2510 Atherosclerotic heart disease of native coronary artery without angina pectoris: Secondary | ICD-10-CM | POA: Diagnosis not present

## 2021-10-15 DIAGNOSIS — I10 Essential (primary) hypertension: Secondary | ICD-10-CM | POA: Diagnosis not present

## 2021-10-15 DIAGNOSIS — M6530 Trigger finger, unspecified finger: Secondary | ICD-10-CM | POA: Diagnosis not present

## 2021-10-16 ENCOUNTER — Encounter: Payer: Self-pay | Admitting: Family Medicine

## 2021-10-16 ENCOUNTER — Other Ambulatory Visit: Payer: Self-pay

## 2021-10-16 ENCOUNTER — Ambulatory Visit (INDEPENDENT_AMBULATORY_CARE_PROVIDER_SITE_OTHER): Payer: Medicare Other | Admitting: Family Medicine

## 2021-10-16 VITALS — BP 124/60 | HR 47 | Ht 64.0 in | Wt 162.4 lb

## 2021-10-16 DIAGNOSIS — I10 Essential (primary) hypertension: Secondary | ICD-10-CM

## 2021-10-16 DIAGNOSIS — M1711 Unilateral primary osteoarthritis, right knee: Secondary | ICD-10-CM

## 2021-10-16 DIAGNOSIS — R001 Bradycardia, unspecified: Secondary | ICD-10-CM

## 2021-10-16 DIAGNOSIS — Z09 Encounter for follow-up examination after completed treatment for conditions other than malignant neoplasm: Secondary | ICD-10-CM | POA: Diagnosis not present

## 2021-10-16 DIAGNOSIS — Z7185 Encounter for immunization safety counseling: Secondary | ICD-10-CM | POA: Diagnosis not present

## 2021-10-16 DIAGNOSIS — M5090 Cervical disc disorder, unspecified, unspecified cervical region: Secondary | ICD-10-CM

## 2021-10-16 MED ORDER — SHINGRIX 50 MCG/0.5ML IM SUSR: 0.5000 mL | Freq: Once | INTRAMUSCULAR | 0 refills | Status: AC

## 2021-10-16 MED ORDER — GABAPENTIN 100 MG PO CAPS: 100.0000 mg | ORAL_CAPSULE | Freq: Two times a day (BID) | ORAL | 1 refills | Status: DC | PRN

## 2021-10-16 NOTE — Progress Notes (Signed)
    SUBJECTIVE:   CHIEF COMPLAINT / HPI:   Ms. Deschene is a 80 yo F who presents for ED follow up.  She is doing well today and does not have any concerns.  See in the ED 10/15 for chest pain.  States she occasionally has right arm pain that radiates into her neck and chest and this occurred this past Saturday.  She attempted to lay down to rest and then was woken up with chest pain.  She took 2 nitro pills and did not resolve so she called the ED.  She is no longer endorsing chest pain at this time.  Cardiology follow-up planned for February of next year.  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  For this she is connected with physical therapy and Occupational Therapy as well as a home health nurse.  She also lives with her daughter.  She has all home monitors such as blood pressure cuff and pulse ox machine.  They are planning to also get her a shower bench.  Of note she also sees Dr. Letta Pate who is a physical medicine and rehabilitation physician.   PERTINENT  PMH / PSH: s/p PCI 06/2016  OBJECTIVE:   BP 124/60   Pulse (!) 47   Ht 5\' 4"  (1.626 m)   Wt 73.7 kg   SpO2 96%   BMI 27.88 kg/m   General: Appears well, no acute distress. Age appropriate. Cardiac: RRR, normal heart sounds, no murmurs Respiratory: CTAB, normal effort Extremities: No LE edema or cyanosis. Skin: Warm and dry, no rashes noted Neuro: alert and oriented x4 Psych: normal affect  ASSESSMENT/PLAN:   Follow up, atypical chest pain Seen in ED 10/15. Reviewed ED visit and cardiac work up unremarkable. Pain also not relieved by nitro or aspirin could consider neuropathic v. MSK causes. Encouraged to keep cardiology follow up. Has cardiology follow up 01/2022. Could consider event monitor if chest pain returns before then.   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 Could be source of pain;  referred chest pain.  Discussed follow up with PMR doctor and continued home physical therapy. Gabapentin refilled.    Essential hypertension  Bradycardia BP wnl. Bradycardic today. Asymptomatic. Reviewed 10/17 EKG HR wnl and no acute findings.   Also consider decreasing beta-blocker dose if HR continues to trend low.   Vaccine counseling - Zoster Vaccine Adjuvanted [facility]) injection; Inject 0.5 mLs into the muscle once for 1 dose.  Dispense: 0.5 mL; Refill: 0  Gerlene Fee, DO [place]

## 2021-10-16 NOTE — Patient Instructions (Addendum)
Thank you for coming in today.  I am happy to hear you have not experienced continued chest pain. For the arm pain continue physical and Occupational Therapy at home.  When you desire to go to a rehab facility for physical therapy please let us know.  Follow-up with physical medicine for your knee.  I have also sent you with a prescription to get your shingles vaccine.  Continue to take your medications for your blood pressure as prescribed.

## 2021-10-18 DIAGNOSIS — M5431 Sciatica, right side: Secondary | ICD-10-CM | POA: Diagnosis not present

## 2021-10-18 DIAGNOSIS — M6530 Trigger finger, unspecified finger: Secondary | ICD-10-CM | POA: Diagnosis not present

## 2021-10-18 DIAGNOSIS — E119 Type 2 diabetes mellitus without complications: Secondary | ICD-10-CM | POA: Diagnosis not present

## 2021-10-18 DIAGNOSIS — I2510 Atherosclerotic heart disease of native coronary artery without angina pectoris: Secondary | ICD-10-CM | POA: Diagnosis not present

## 2021-10-18 DIAGNOSIS — M5412 Radiculopathy, cervical region: Secondary | ICD-10-CM | POA: Diagnosis not present

## 2021-10-18 DIAGNOSIS — I10 Essential (primary) hypertension: Secondary | ICD-10-CM | POA: Diagnosis not present

## 2021-10-23 DIAGNOSIS — I2510 Atherosclerotic heart disease of native coronary artery without angina pectoris: Secondary | ICD-10-CM | POA: Diagnosis not present

## 2021-10-23 DIAGNOSIS — M5431 Sciatica, right side: Secondary | ICD-10-CM | POA: Diagnosis not present

## 2021-10-23 DIAGNOSIS — E119 Type 2 diabetes mellitus without complications: Secondary | ICD-10-CM | POA: Diagnosis not present

## 2021-10-23 DIAGNOSIS — M5412 Radiculopathy, cervical region: Secondary | ICD-10-CM | POA: Diagnosis not present

## 2021-10-23 DIAGNOSIS — I10 Essential (primary) hypertension: Secondary | ICD-10-CM | POA: Diagnosis not present

## 2021-10-23 DIAGNOSIS — M6530 Trigger finger, unspecified finger: Secondary | ICD-10-CM | POA: Diagnosis not present

## 2021-10-24 DIAGNOSIS — M5431 Sciatica, right side: Secondary | ICD-10-CM | POA: Diagnosis not present

## 2021-10-24 DIAGNOSIS — M5412 Radiculopathy, cervical region: Secondary | ICD-10-CM | POA: Diagnosis not present

## 2021-10-24 DIAGNOSIS — I2510 Atherosclerotic heart disease of native coronary artery without angina pectoris: Secondary | ICD-10-CM | POA: Diagnosis not present

## 2021-10-24 DIAGNOSIS — M6530 Trigger finger, unspecified finger: Secondary | ICD-10-CM | POA: Diagnosis not present

## 2021-10-24 DIAGNOSIS — I10 Essential (primary) hypertension: Secondary | ICD-10-CM | POA: Diagnosis not present

## 2021-10-24 DIAGNOSIS — E119 Type 2 diabetes mellitus without complications: Secondary | ICD-10-CM | POA: Diagnosis not present

## 2021-10-25 ENCOUNTER — Other Ambulatory Visit: Payer: Self-pay | Admitting: Family Medicine

## 2021-10-25 DIAGNOSIS — M6530 Trigger finger, unspecified finger: Secondary | ICD-10-CM | POA: Diagnosis not present

## 2021-10-25 DIAGNOSIS — E119 Type 2 diabetes mellitus without complications: Secondary | ICD-10-CM | POA: Diagnosis not present

## 2021-10-25 DIAGNOSIS — I10 Essential (primary) hypertension: Secondary | ICD-10-CM

## 2021-10-25 DIAGNOSIS — M5431 Sciatica, right side: Secondary | ICD-10-CM | POA: Diagnosis not present

## 2021-10-25 DIAGNOSIS — I2510 Atherosclerotic heart disease of native coronary artery without angina pectoris: Secondary | ICD-10-CM | POA: Diagnosis not present

## 2021-10-25 DIAGNOSIS — M5412 Radiculopathy, cervical region: Secondary | ICD-10-CM | POA: Diagnosis not present

## 2021-10-25 MED ORDER — METOPROLOL TARTRATE 25 MG PO TABS: 12.5000 mg | ORAL_TABLET | Freq: Two times a day (BID) | ORAL | 3 refills | Status: DC

## 2021-10-25 NOTE — Progress Notes (Signed)
Discussed with patient to decrease metoprolol to 1/2 tab in AM and bedtime to improve asymptomatic bradycardia. She voiced understanding. Medication change updated.   Gerlene Fee, DO 10/25/2021, 10:17 AM PGY-3, [place]

## 2021-10-30 DIAGNOSIS — I10 Essential (primary) hypertension: Secondary | ICD-10-CM | POA: Diagnosis not present

## 2021-10-30 DIAGNOSIS — M5412 Radiculopathy, cervical region: Secondary | ICD-10-CM | POA: Diagnosis not present

## 2021-10-30 DIAGNOSIS — M5431 Sciatica, right side: Secondary | ICD-10-CM | POA: Diagnosis not present

## 2021-10-30 DIAGNOSIS — I2510 Atherosclerotic heart disease of native coronary artery without angina pectoris: Secondary | ICD-10-CM | POA: Diagnosis not present

## 2021-10-30 DIAGNOSIS — M6530 Trigger finger, unspecified finger: Secondary | ICD-10-CM | POA: Diagnosis not present

## 2021-10-30 DIAGNOSIS — E119 Type 2 diabetes mellitus without complications: Secondary | ICD-10-CM | POA: Diagnosis not present

## 2021-11-01 ENCOUNTER — Encounter: Payer: Medicare Other | Attending: Physical Medicine & Rehabilitation | Admitting: Physical Medicine & Rehabilitation

## 2021-11-01 ENCOUNTER — Encounter: Payer: Self-pay | Admitting: Physical Medicine & Rehabilitation

## 2021-11-01 ENCOUNTER — Other Ambulatory Visit: Payer: Self-pay

## 2021-11-01 VITALS — BP 147/75 | HR 53 | Ht 64.0 in | Wt 160.6 lb

## 2021-11-01 DIAGNOSIS — M25461 Effusion, right knee: Secondary | ICD-10-CM | POA: Diagnosis not present

## 2021-11-01 DIAGNOSIS — I200 Unstable angina: Secondary | ICD-10-CM | POA: Diagnosis not present

## 2021-11-01 NOTE — Patient Instructions (Addendum)
[place] for knee xray  Voltaren gel 3-4 times per day

## 2021-11-01 NOTE — Progress Notes (Signed)
Subjective:    Patient ID: Heidi Wolf, female    DOB: Sep 28, 1941, 80 y.o.   MRN: [id number]  HPI 80 yo female with hx of knee OA, went to ED for chest pain 10/15  Currently getting HHPT  On adjusted dose of metoprolol  RIght knee pain and swelling   CLINICAL DATA:  Cervical radiculopathy, no red flags   EXAM: MRI CERVICAL SPINE WITHOUT CONTRAST   TECHNIQUE: Multiplanar, multisequence MR imaging of the cervical spine was performed. No intravenous contrast was administered.   COMPARISON:  08/31/2020   FINDINGS: Alignment: Stable.   Vertebrae: Stable vertebral body heights. No marrow edema. No suspicious osseous lesion.   Cord: Normal caliber and signal.   Posterior Fossa, vertebral arteries, paraspinal tissues: Right thyroid nodule previously evaluated by ultrasound. Otherwise unremarkable.   Disc levels:   C2-C3: Small central disc protrusion. No canal or foraminal stenosis.   C3-C4: Slight progression of central/left paracentral disc protrusion with endplate osteophytes. Uncovertebral and facet hypertrophy. Increased indentation of the left ventral thecal sac. No right foraminal stenosis. Mild to moderate left foraminal stenosis.   C4-C5: Disc bulge with endplate osteophytes. Facet hypertrophy. No canal or foraminal stenosis.   C5-C6: Disc bulge with endplate osteophytes. Uncovertebral hypertrophy. Mild canal stenosis. Mild right and moderate left foraminal stenosis.   C6-C7: Disc bulge with endplate osteophytes. Uncovertebral hypertrophy. Minor canal stenosis. Mild right and moderate left foraminal stenosis.   C7-T1: Disc bulge with endplate osteophytes. No canal or foraminal stenosis.   IMPRESSION: Multilevel degenerative changes as detailed above. No high-grade canal or foraminal stenosis. Increased central/left paracentral disc protrusion at C3-C4 with greater indentation of the left ventral thecal sac. Foraminal narrowing is greatest on  the left at C5-C6 and C6-C7.     Electronically Signed   By: Macy Mis M.D.   On: 10/12/2021 17:53  CLINICAL DATA:  Neuro deficit, acute, stroke suspected   EXAM: MRI HEAD WITHOUT CONTRAST   TECHNIQUE: Multiplanar, multiecho pulse sequences of the brain and surrounding structures were obtained without intravenous contrast.   COMPARISON:  None.   FINDINGS: Brain: There is no acute infarction or intracranial hemorrhage. There is no intracranial mass, mass effect, or edema. There is no hydrocephalus or extra-axial fluid collection. Prominence of the ventricles and sulci reflects mild parenchymal volume loss. Patchy foci of T2 hyperintensity in the supratentorial white matter are nonspecific but may reflect mild chronic microvascular ischemic changes.   Vascular: Major vessel flow voids at the skull base are preserved.   Skull and upper cervical spine: Normal marrow signal is preserved.   Sinuses/Orbits: Paranasal sinuses are aerated. Orbits are unremarkable.   Other: Sella is partially empty.  Mastoid air cells are clear.   IMPRESSION: No evidence of recent infarction, hemorrhage, or mass. Mild chronic microvascular ischemic changes.     Electronically Signed   By: Macy Mis M.D.   On: 10/12/2021 18:03 Pain Inventory Average Pain 7 Pain Right Now 5 My pain is sharp, burning, and aching  In the last 24 hours, has pain interfered with the following? General activity 5 Relation with others 0 Enjoyment of life 0 What TIME of day is your pain at its worst? daytime, evening, and night Sleep (in general) Good  Pain is worse with: walking, bending, and standing Pain improves with: rest, heat/ice, therapy/exercise, and medication Relief from Meds: 7  Family History  Problem Relation Age of Onset   Diabetes Mother    Arthritis Mother    CAD  Mother 57       CABG   CAD Brother    CAD Sister 45       CABG   Stroke Brother    Stroke Daughter     Diabetes Maternal Grandmother    Stroke Maternal Grandmother    Stroke Father    Heart failure Sister    Kidney failure Sister    Breast cancer Other    Colon cancer Neg Hx    Stomach cancer Neg Hx    Esophageal cancer Neg Hx    Pancreatic cancer Neg Hx    Rectal cancer Neg Hx    Social History   Socioeconomic History   Marital status: Legally Separated    Spouse name: Not on file   Number of children: 3   Years of education: Not on file   Highest education level: Not on file  Occupational History   Occupation: retired  Tobacco Use   Smoking status: Never   Smokeless tobacco: Never  Vaping Use   Vaping Use: Never used  Substance and Sexual Activity   Alcohol use: No   Drug use: No   Sexual activity: Never  Other Topics Concern   Not on file  Social History Narrative   LIves with Tammy   Social Determinants of Health   Financial Resource Strain: Not on file  Food Insecurity: Not on file  Transportation Needs: Not on file  Physical Activity: Not on file  Stress: Not on file  Social Connections: Not on file   Past Surgical History:  Procedure Laterality Date   [place]     "w/gallbladder"   CARDIAC CATHETERIZATION  X 2   CARDIAC CATHETERIZATION N/A 07/03/2016   Procedure: Left Heart Cath and Coronary Angiography;  Surgeon: Dixie Dials, MD;  Location: [place] [facility];  Service: Cardiovascular;  Laterality: N/A;   CARDIAC CATHETERIZATION N/A 07/03/2016   Procedure: Coronary Stent Intervention;  Surgeon: Jettie Booze, MD;  Location: [place] [facility];  Service: Cardiovascular;  Laterality: N/A;   CARDIAC CATHETERIZATION N/A 07/15/2016   Procedure: Left Heart Cath and Coronary Angiography;  Surgeon: Dixie Dials, MD;  Location: [place] [facility];  Service: Cardiovascular;  Laterality: N/A;   CHOLECYSTECTOMY     COLONOSCOPY     CORONARY ANGIOPLASTY WITH STENT PLACEMENT  06/2016   TUBAL LIGATION  1971   Past Surgical History:   Procedure Laterality Date   ABDOMINAL HYSTERECTOMY     APPENDECTOMY     "w/gallbladder"   CARDIAC CATHETERIZATION  X 2   CARDIAC CATHETERIZATION N/A 07/03/2016   Procedure: Left Heart Cath and Coronary Angiography;  Surgeon: Dixie Dials, MD;  Location: [place] [facility];  Service: Cardiovascular;  Laterality: N/A;   CARDIAC CATHETERIZATION N/A 07/03/2016   Procedure: Coronary Stent Intervention;  Surgeon: Jettie Booze, MD;  Location: [place] [facility];  Service: Cardiovascular;  Laterality: N/A;   CARDIAC CATHETERIZATION N/A 07/15/2016   Procedure: Left Heart Cath and Coronary Angiography;  Surgeon: Dixie Dials, MD;  Location: [place] [facility];  Service: Cardiovascular;  Laterality: N/A;   CHOLECYSTECTOMY     COLONOSCOPY     CORONARY ANGIOPLASTY WITH STENT PLACEMENT  06/2016   TUBAL LIGATION  1971   Past Medical History:  Diagnosis Date   Allergy    Anemia    Anxiety    Arthritis    "right hip" (09/21/2014)   CAD (coronary artery disease)    PCI 90%  diagonal stenosis witha DES July 2017.  Nonobstructive disease elsewhere.    Cataracts, both eyes    Colon polyps    Hyperplastic 2003   External hemorrhoid    GERD (gastroesophageal reflux disease)    Glaucoma    Hyperlipidemia    Hypertension    Internal hemorrhoid    Knee effusion, right 03/05/2020   Myocardial infarction [facility])    Palpitations 09/03/2016   Holter Aug 2017- PACs, no arrhythmia.    Personal history of colonic polyps 03/03/2012   Type II diabetes mellitus (HCC)    type 2   Whiplash injury, acute, sequela 03/05/2020   Ht 5\' 4"  (1.626 m)   Wt 160 lb 9.6 oz (72.8 kg)   BMI 27.57 kg/m   Opioid Risk Score:   Fall Risk Score:  `1  Depression screen PHQ 2/9  Depression screen [facility] 2/9 11/01/2021 10/16/2021 10/02/2021 06/10/2021 03/08/2021 01/11/2021 11/07/2020  Decreased Interest 0 0 0 0 0 0 0  Down, Depressed, Hopeless 0 0 0 0 0 0 0  PHQ - 2 Score 0 0 0 0 0 0 0  Altered sleeping - 0 0 0 0 - 0  Tired,  decreased energy - 1 3 1 1  - 0  Change in appetite - 0 0 0 0 - 0  Feeling bad or failure about yourself  - 0 0 0 0 - 0  Trouble concentrating - 0 0 0 0 - 0  Moving slowly or fidgety/restless - 0 0 0 0 - 0  Suicidal thoughts - 0 0 0 0 - 0  PHQ-9 Score - 1 3 1 1  - 0  Difficult doing work/chores - Somewhat difficult Somewhat difficult Not difficult at all Somewhat difficult - -  Some recent data might be hidden     Review of Systems  Constitutional: Negative.   HENT: Negative.    Eyes: Negative.   Respiratory: Negative.    Cardiovascular: Negative.   Gastrointestinal: Negative.   Endocrine: Negative.   Genitourinary: Negative.   Musculoskeletal: Negative.   Skin: Negative.   Allergic/Immunologic: Negative.   Neurological:  Positive for weakness and numbness.  Psychiatric/Behavioral: Negative.        Objective:   Physical Exam Vitals and nursing note reviewed.  Constitutional:      Appearance: She is normal weight.  HENT:     Head: Normocephalic and atraumatic.  Eyes:     Extraocular Movements: Extraocular movements intact.     Conjunctiva/sclera: Conjunctivae normal.     Pupils: Pupils are equal, round, and reactive to light.  Musculoskeletal:     Cervical back: Normal range of motion.     Comments: Right knee with moderate suprapatellar effusion nontender normal knee range of motion no erythema mild joint line tenderness. Ambulates without assistive device she is able to ambulate with good weightbearing on the right side. Left knee without effusion has full range of motion  Skin:    General: Skin is warm and dry.  Neurological:     Mental Status: She is alert and oriented to person, place, and time.     Comments: Motor strength is 5/5 bilateral hip flexor knee extensor ankle dorsiflexor Negative straight leg raising   Psychiatric:        Mood and Affect: Mood normal.        Behavior: Behavior normal.          Assessment & Plan:  #1.  Right knee osteoarthritis  medial compartment has responded to Zilretta in the past,  has had recurrence of pain with a larger effusion today, no history of trauma.  No pain with knee range of motion or weightbearing.  We will check x-rays Repeat Zilretta next month ultrasound guidance

## 2021-11-02 DIAGNOSIS — M6530 Trigger finger, unspecified finger: Secondary | ICD-10-CM | POA: Diagnosis not present

## 2021-11-02 DIAGNOSIS — I2510 Atherosclerotic heart disease of native coronary artery without angina pectoris: Secondary | ICD-10-CM | POA: Diagnosis not present

## 2021-11-02 DIAGNOSIS — I10 Essential (primary) hypertension: Secondary | ICD-10-CM | POA: Diagnosis not present

## 2021-11-02 DIAGNOSIS — E119 Type 2 diabetes mellitus without complications: Secondary | ICD-10-CM | POA: Diagnosis not present

## 2021-11-02 DIAGNOSIS — M5431 Sciatica, right side: Secondary | ICD-10-CM | POA: Diagnosis not present

## 2021-11-02 DIAGNOSIS — Z955 Presence of coronary angioplasty implant and graft: Secondary | ICD-10-CM | POA: Diagnosis not present

## 2021-11-02 DIAGNOSIS — E785 Hyperlipidemia, unspecified: Secondary | ICD-10-CM | POA: Diagnosis not present

## 2021-11-02 DIAGNOSIS — M5412 Radiculopathy, cervical region: Secondary | ICD-10-CM | POA: Diagnosis not present

## 2021-11-04 DIAGNOSIS — M6530 Trigger finger, unspecified finger: Secondary | ICD-10-CM | POA: Diagnosis not present

## 2021-11-04 DIAGNOSIS — I2510 Atherosclerotic heart disease of native coronary artery without angina pectoris: Secondary | ICD-10-CM | POA: Diagnosis not present

## 2021-11-04 DIAGNOSIS — M5431 Sciatica, right side: Secondary | ICD-10-CM | POA: Diagnosis not present

## 2021-11-04 DIAGNOSIS — E119 Type 2 diabetes mellitus without complications: Secondary | ICD-10-CM | POA: Diagnosis not present

## 2021-11-04 DIAGNOSIS — I10 Essential (primary) hypertension: Secondary | ICD-10-CM | POA: Diagnosis not present

## 2021-11-04 DIAGNOSIS — M5412 Radiculopathy, cervical region: Secondary | ICD-10-CM | POA: Diagnosis not present

## 2021-11-06 DIAGNOSIS — E119 Type 2 diabetes mellitus without complications: Secondary | ICD-10-CM | POA: Diagnosis not present

## 2021-11-06 DIAGNOSIS — I2510 Atherosclerotic heart disease of native coronary artery without angina pectoris: Secondary | ICD-10-CM | POA: Diagnosis not present

## 2021-11-06 DIAGNOSIS — M6530 Trigger finger, unspecified finger: Secondary | ICD-10-CM | POA: Diagnosis not present

## 2021-11-06 DIAGNOSIS — I10 Essential (primary) hypertension: Secondary | ICD-10-CM | POA: Diagnosis not present

## 2021-11-06 DIAGNOSIS — M5412 Radiculopathy, cervical region: Secondary | ICD-10-CM | POA: Diagnosis not present

## 2021-11-06 DIAGNOSIS — M5431 Sciatica, right side: Secondary | ICD-10-CM | POA: Diagnosis not present

## 2021-11-07 ENCOUNTER — Other Ambulatory Visit: Payer: Self-pay

## 2021-11-07 ENCOUNTER — Ambulatory Visit
Admission: RE | Admit: 2021-11-07 | Discharge: 2021-11-07 | Disposition: A | Discharge status: Home / Self Care | Payer: Medicare Other | Source: Ambulatory Visit | Attending: Physical Medicine & Rehabilitation | Admitting: Physical Medicine & Rehabilitation

## 2021-11-07 DIAGNOSIS — M25461 Effusion, right knee: Secondary | ICD-10-CM

## 2021-11-07 DIAGNOSIS — M7989 Other specified soft tissue disorders: Secondary | ICD-10-CM | POA: Diagnosis not present

## 2021-11-08 DIAGNOSIS — M5431 Sciatica, right side: Secondary | ICD-10-CM | POA: Diagnosis not present

## 2021-11-08 DIAGNOSIS — M6530 Trigger finger, unspecified finger: Secondary | ICD-10-CM | POA: Diagnosis not present

## 2021-11-08 DIAGNOSIS — E119 Type 2 diabetes mellitus without complications: Secondary | ICD-10-CM | POA: Diagnosis not present

## 2021-11-08 DIAGNOSIS — I10 Essential (primary) hypertension: Secondary | ICD-10-CM | POA: Diagnosis not present

## 2021-11-08 DIAGNOSIS — I2510 Atherosclerotic heart disease of native coronary artery without angina pectoris: Secondary | ICD-10-CM | POA: Diagnosis not present

## 2021-11-08 DIAGNOSIS — M5412 Radiculopathy, cervical region: Secondary | ICD-10-CM | POA: Diagnosis not present

## 2021-11-11 ENCOUNTER — Other Ambulatory Visit: Payer: Self-pay | Admitting: Family Medicine

## 2021-11-11 ENCOUNTER — Other Ambulatory Visit: Payer: Self-pay | Admitting: *Deleted

## 2021-11-11 DIAGNOSIS — Z1231 Encounter for screening mammogram for malignant neoplasm of breast: Secondary | ICD-10-CM

## 2021-11-15 DIAGNOSIS — I10 Essential (primary) hypertension: Secondary | ICD-10-CM | POA: Diagnosis not present

## 2021-11-15 DIAGNOSIS — I2510 Atherosclerotic heart disease of native coronary artery without angina pectoris: Secondary | ICD-10-CM | POA: Diagnosis not present

## 2021-11-15 DIAGNOSIS — M5431 Sciatica, right side: Secondary | ICD-10-CM | POA: Diagnosis not present

## 2021-11-15 DIAGNOSIS — M5412 Radiculopathy, cervical region: Secondary | ICD-10-CM | POA: Diagnosis not present

## 2021-11-15 DIAGNOSIS — M6530 Trigger finger, unspecified finger: Secondary | ICD-10-CM | POA: Diagnosis not present

## 2021-11-15 DIAGNOSIS — E119 Type 2 diabetes mellitus without complications: Secondary | ICD-10-CM | POA: Diagnosis not present

## 2021-11-19 DIAGNOSIS — I10 Essential (primary) hypertension: Secondary | ICD-10-CM | POA: Diagnosis not present

## 2021-11-19 DIAGNOSIS — M6530 Trigger finger, unspecified finger: Secondary | ICD-10-CM | POA: Diagnosis not present

## 2021-11-19 DIAGNOSIS — M5412 Radiculopathy, cervical region: Secondary | ICD-10-CM | POA: Diagnosis not present

## 2021-11-19 DIAGNOSIS — M5431 Sciatica, right side: Secondary | ICD-10-CM | POA: Diagnosis not present

## 2021-11-19 DIAGNOSIS — I2510 Atherosclerotic heart disease of native coronary artery without angina pectoris: Secondary | ICD-10-CM | POA: Diagnosis not present

## 2021-11-19 DIAGNOSIS — E119 Type 2 diabetes mellitus without complications: Secondary | ICD-10-CM | POA: Diagnosis not present

## 2021-11-20 DIAGNOSIS — M5412 Radiculopathy, cervical region: Secondary | ICD-10-CM | POA: Diagnosis not present

## 2021-11-20 DIAGNOSIS — I2510 Atherosclerotic heart disease of native coronary artery without angina pectoris: Secondary | ICD-10-CM | POA: Diagnosis not present

## 2021-11-20 DIAGNOSIS — M6530 Trigger finger, unspecified finger: Secondary | ICD-10-CM | POA: Diagnosis not present

## 2021-11-20 DIAGNOSIS — I10 Essential (primary) hypertension: Secondary | ICD-10-CM | POA: Diagnosis not present

## 2021-11-20 DIAGNOSIS — E119 Type 2 diabetes mellitus without complications: Secondary | ICD-10-CM | POA: Diagnosis not present

## 2021-11-20 DIAGNOSIS — M5431 Sciatica, right side: Secondary | ICD-10-CM | POA: Diagnosis not present

## 2021-11-25 DIAGNOSIS — M6530 Trigger finger, unspecified finger: Secondary | ICD-10-CM | POA: Diagnosis not present

## 2021-11-25 DIAGNOSIS — I2510 Atherosclerotic heart disease of native coronary artery without angina pectoris: Secondary | ICD-10-CM | POA: Diagnosis not present

## 2021-11-25 DIAGNOSIS — M5431 Sciatica, right side: Secondary | ICD-10-CM | POA: Diagnosis not present

## 2021-11-25 DIAGNOSIS — E119 Type 2 diabetes mellitus without complications: Secondary | ICD-10-CM | POA: Diagnosis not present

## 2021-11-25 DIAGNOSIS — M5412 Radiculopathy, cervical region: Secondary | ICD-10-CM | POA: Diagnosis not present

## 2021-11-25 DIAGNOSIS — I10 Essential (primary) hypertension: Secondary | ICD-10-CM | POA: Diagnosis not present

## 2021-11-29 DIAGNOSIS — M5412 Radiculopathy, cervical region: Secondary | ICD-10-CM | POA: Diagnosis not present

## 2021-11-29 DIAGNOSIS — M5431 Sciatica, right side: Secondary | ICD-10-CM | POA: Diagnosis not present

## 2021-11-29 DIAGNOSIS — I10 Essential (primary) hypertension: Secondary | ICD-10-CM | POA: Diagnosis not present

## 2021-11-29 DIAGNOSIS — E119 Type 2 diabetes mellitus without complications: Secondary | ICD-10-CM | POA: Diagnosis not present

## 2021-11-29 DIAGNOSIS — M6530 Trigger finger, unspecified finger: Secondary | ICD-10-CM | POA: Diagnosis not present

## 2021-11-29 DIAGNOSIS — I2510 Atherosclerotic heart disease of native coronary artery without angina pectoris: Secondary | ICD-10-CM | POA: Diagnosis not present

## 2021-12-23 ENCOUNTER — Other Ambulatory Visit: Payer: Self-pay | Admitting: Family Medicine

## 2021-12-23 DIAGNOSIS — K219 Gastro-esophageal reflux disease without esophagitis: Secondary | ICD-10-CM

## 2021-12-25 ENCOUNTER — Ambulatory Visit
Admission: RE | Admit: 2021-12-25 | Discharge: 2021-12-25 | Disposition: A | Discharge status: Home / Self Care | Payer: Medicare Other | Source: Ambulatory Visit | Attending: *Deleted | Admitting: *Deleted

## 2021-12-25 DIAGNOSIS — Z1231 Encounter for screening mammogram for malignant neoplasm of breast: Secondary | ICD-10-CM | POA: Diagnosis not present

## 2021-12-31 ENCOUNTER — Encounter: Payer: Self-pay | Admitting: Physical Medicine & Rehabilitation

## 2021-12-31 ENCOUNTER — Other Ambulatory Visit: Payer: Self-pay

## 2021-12-31 ENCOUNTER — Encounter: Payer: Medicare Other | Attending: Physical Medicine & Rehabilitation | Admitting: Physical Medicine & Rehabilitation

## 2021-12-31 VITALS — BP 151/76 | HR 55 | Temp 98.9°F | Ht 64.0 in | Wt 162.0 lb

## 2021-12-31 DIAGNOSIS — M1711 Unilateral primary osteoarthritis, right knee: Secondary | ICD-10-CM | POA: Insufficient documentation

## 2021-12-31 NOTE — Patient Instructions (Signed)
Continue tylenol arthritis 650mg  no more than 3 per day  Continue Voltraen gel 3 times a day  Call if pain increased in the RIght knee and we can inject then

## 2021-12-31 NOTE — Progress Notes (Signed)
Subjective:    Patient ID: Heidi Wolf, female    DOB: [birth year], 81 y.o.   MRN: [id number]  HPI 81 year old female with history of right chronic knee pain and swelling.  Interval history she has undergone x-ray of the right knee demonstrating moderate osteoarthritis as well as a mild effusion.  She has had no falls or new injuries over the last 2 months. She has undergone home health physical therapy and states that this has been very helpful in alleviating her right knee pain as well as increasing her mobility.  She was told by her physical therapist that her strength improved by 90%. The patient is using Tylenol arthritis 650 mg approximately twice a day.  In addition she is using Voltaren gel to her right knee 3 times a day. Pain Inventory Average Pain 4 Pain Right Now 1 My pain is sharp and burning  In the last 24 hours, has pain interfered with the following? General activity 0 Relation with others 0 Enjoyment of life 0 What TIME of day is your pain at its worst? varies Sleep (in general) Good  Pain is worse with: bending Pain improves with: rest Relief from Meds:  na  Family History  Problem Relation Age of Onset   Diabetes Mother    Arthritis Mother    CAD Mother 38       CABG   CAD Brother    CAD Sister 56       CABG   Stroke Brother    Stroke Daughter    Diabetes Maternal Grandmother    Stroke Maternal Grandmother    Stroke Father    Heart failure Sister    Kidney failure Sister    Breast cancer Other    Colon cancer Neg Hx    Stomach cancer Neg Hx    Esophageal cancer Neg Hx    Pancreatic cancer Neg Hx    Rectal cancer Neg Hx    Social History   Socioeconomic History   Marital status: Legally Separated    Spouse name: Not on file   Number of children: 3   Years of education: Not on file   Highest education level: Not on file  Occupational History   Occupation: retired  Tobacco Use   Smoking status: Never   Smokeless tobacco: Never   Vaping Use   Vaping Use: Never used  Substance and Sexual Activity   Alcohol use: No   Drug use: No   Sexual activity: Never  Other Topics Concern   Not on file  Social History Narrative   LIves with Tammy   Social Determinants of Health   Financial Resource Strain: Not on file  Food Insecurity: Not on file  Transportation Needs: Not on file  Physical Activity: Not on file  Stress: Not on file  Social Connections: Not on file   Past Surgical History:  Procedure Laterality Date   ABDOMINAL HYSTERECTOMY     APPENDECTOMY     "w/gallbladder"   CARDIAC CATHETERIZATION  X 2   CARDIAC CATHETERIZATION N/A 07/03/2016   Procedure: Left Heart Cath and Coronary Angiography;  Surgeon: Dixie Dials, MD;  Location: [place] [facility];  Service: Cardiovascular;  Laterality: N/A;   CARDIAC CATHETERIZATION N/A 07/03/2016   Procedure: Coronary Stent Intervention;  Surgeon: Jettie Booze, MD;  Location: [place] [facility];  Service: Cardiovascular;  Laterality: N/A;   CARDIAC CATHETERIZATION N/A 07/15/2016   Procedure: Left Heart Cath and Coronary Angiography;  Surgeon: Dixie Dials,  MD;  Location: [place] [facility];  Service: Cardiovascular;  Laterality: N/A;   CHOLECYSTECTOMY     COLONOSCOPY     CORONARY ANGIOPLASTY WITH STENT PLACEMENT  06/2016   TUBAL LIGATION  1971   Past Surgical History:  Procedure Laterality Date   ABDOMINAL HYSTERECTOMY     APPENDECTOMY     "w/gallbladder"   CARDIAC CATHETERIZATION  X 2   CARDIAC CATHETERIZATION N/A 07/03/2016   Procedure: Left Heart Cath and Coronary Angiography;  Surgeon: Dixie Dials, MD;  Location: [place] [facility];  Service: Cardiovascular;  Laterality: N/A;   CARDIAC CATHETERIZATION N/A 07/03/2016   Procedure: Coronary Stent Intervention;  Surgeon: Jettie Booze, MD;  Location: [place] [facility];  Service: Cardiovascular;  Laterality: N/A;   CARDIAC CATHETERIZATION N/A 07/15/2016   Procedure: Left Heart Cath and Coronary  Angiography;  Surgeon: Dixie Dials, MD;  Location: [place] [facility];  Service: Cardiovascular;  Laterality: N/A;   CHOLECYSTECTOMY     COLONOSCOPY     CORONARY ANGIOPLASTY WITH STENT PLACEMENT  06/2016   TUBAL LIGATION  1971   Past Medical History:  Diagnosis Date   Allergy    Anemia    Anxiety    Arthritis    "right hip" (09/21/2014)   CAD (coronary artery disease)    PCI 90% diagonal stenosis witha DES July 2017.  Nonobstructive disease elsewhere.    Cataracts, both eyes    Colon polyps    Hyperplastic 2003   External hemorrhoid    GERD (gastroesophageal reflux disease)    Glaucoma    Hyperlipidemia    Hypertension    Internal hemorrhoid    Knee effusion, right 03/05/2020   Myocardial infarction [facility])    Palpitations 09/03/2016   Holter Aug 2017- PACs, no arrhythmia.    Personal history of colonic polyps 03/03/2012   Type II diabetes mellitus (HCC)    type 2   Whiplash injury, acute, sequela 03/05/2020   BP (!) 151/76    Pulse (!) 55    Temp 98.9 F (37.2 C) (Oral)    Ht 5\' 4"  (1.626 m)    Wt 162 lb (73.5 kg)    SpO2 98%    PF (!) 0 L/min    BMI 27.81 kg/m   Opioid Risk Score:   Fall Risk Score:  `1  Depression screen PHQ 2/9  Depression screen [facility] 2/9 11/01/2021 10/16/2021 10/02/2021 06/10/2021 03/08/2021 01/11/2021 11/07/2020  Decreased Interest 0 0 0 0 0 0 0  Down, Depressed, Hopeless 0 0 0 0 0 0 0  PHQ - 2 Score 0 0 0 0 0 0 0  Altered sleeping - 0 0 0 0 - 0  Tired, decreased energy - 1 3 1 1  - 0  Change in appetite - 0 0 0 0 - 0  Feeling bad or failure about yourself  - 0 0 0 0 - 0  Trouble concentrating - 0 0 0 0 - 0  Moving slowly or fidgety/restless - 0 0 0 0 - 0  Suicidal thoughts - 0 0 0 0 - 0  PHQ-9 Score - 1 3 1 1  - 0  Difficult doing work/chores - Somewhat difficult Somewhat difficult Not difficult at all Somewhat difficult - -  Some recent data might be hidden     Review of Systems  Musculoskeletal:        Knee pain  All other systems reviewed and are  negative.     Objective:   Physical Exam  Vitals and nursing note reviewed.  Constitutional:      Appearance: She is normal weight.  HENT:     Head: Normocephalic and atraumatic.  Eyes:     Extraocular Movements: Extraocular movements intact.     Conjunctiva/sclera: Conjunctivae normal.     Pupils: Pupils are equal, round, and reactive to light.  Musculoskeletal:     Right knee: Swelling present. No effusion, erythema or ecchymosis. Decreased range of motion. Tenderness present. No patellar tendon tenderness.  Skin:    General: Skin is warm and dry.  Neurological:     General: No focal deficit present.     Mental Status: She is alert and oriented to person, place, and time.  Psychiatric:        Mood and Affect: Mood normal.        Behavior: Behavior normal.          Assessment & Plan:   1.  Right knee osteoarthritis overall doing better after physical therapy as well as use of acetaminophen 650 mg twice a day as well as diclofenac gel 3 times daily to the right knee. She was originally scheduled for Zilretta injection however since she is doing quite well with both mobility and pain, would hold off on this.  I will see her back in 6 months.  If she has an exacerbation of her pain and would like to schedule injection sooner certainly we can do that

## 2022-01-02 DIAGNOSIS — H401131 Primary open-angle glaucoma, bilateral, mild stage: Secondary | ICD-10-CM | POA: Diagnosis not present

## 2022-01-10 ENCOUNTER — Other Ambulatory Visit: Payer: Self-pay

## 2022-01-10 ENCOUNTER — Encounter: Payer: Self-pay | Admitting: Family Medicine

## 2022-01-10 ENCOUNTER — Ambulatory Visit (INDEPENDENT_AMBULATORY_CARE_PROVIDER_SITE_OTHER): Payer: Medicare Other | Admitting: Family Medicine

## 2022-01-10 VITALS — BP 146/67 | HR 60 | Ht 64.0 in | Wt 163.0 lb

## 2022-01-10 DIAGNOSIS — I10 Essential (primary) hypertension: Secondary | ICD-10-CM

## 2022-01-10 DIAGNOSIS — K219 Gastro-esophageal reflux disease without esophagitis: Secondary | ICD-10-CM

## 2022-01-10 DIAGNOSIS — E1159 Type 2 diabetes mellitus with other circulatory complications: Secondary | ICD-10-CM

## 2022-01-10 LAB — POCT GLYCOSYLATED HEMOGLOBIN (HGB A1C): HbA1c, POC (controlled diabetic range): 6.1 % (ref 0.0–7.0)

## 2022-01-10 MED ORDER — FAMOTIDINE 10 MG PO TABS: 10.0000 mg | ORAL_TABLET | Freq: Two times a day (BID) | ORAL | 0 refills | Status: AC

## 2022-01-10 NOTE — Progress Notes (Signed)
° ° °  SUBJECTIVE:   CHIEF COMPLAINT / HPI:   Prediabetes Patient with history of type 2 diabetes, although A1c has actually been in the prediabetes range for several years with diet control alone. She brings extensive written log of her home glucose readings today. She checks her fasting sugar daily and it almost always ranges between 90-110.  HTN Current meds: amlodipine 10mg  daily, benazepril 20mg  daily, and metoprolol 12.5mg  BID. Of note, Metoprolol was reduced from 25mg  BID to 12.5mg  BID at her visit a few months ago due to bradycardia. Patient checks her blood pressure daily and brings very thorough written log to today's appointment. Majority of home readings range from 025K-270W systolic. Patient notes she's seeing her cardiologist next month.  GERD Patient requests refills on her Protonix. Denies GERD-related symptoms including abdominal pain, chest pain, indigestion, belching, abnormal taste in her mouth, globus sensation, or dysphagia.   PERTINENT  PMH / PSH: CAD s/p PCI 2017, HLD, HTN, GERD, T2DM > prediabetes, thyroid nodule  OBJECTIVE:   BP (!) 146/67    Pulse 60    Ht 5\' 4"  (1.626 m)    Wt 163 lb (73.9 kg)    SpO2 100%    BMI 27.98 kg/m   General: NAD, pleasant, able to participate in exam Respiratory: No respiratory distress Skin: warm and dry, no rashes noted Psych: Normal affect and mood Neuro: grossly intact   ASSESSMENT/PLAN:   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[place]) Very well controlled. A1c 6.1% today. A1c has remained in prediabetic range for several years with diet control alone. Advised patient she does not need to check sugar daily (can check once every other week if she feels inclined). Will plan to repeat A1c in 6 months to 1 year (does not need every 3 months).  GERD (gastroesophageal reflux disease) Well controlled. Will switch from PPI to H2 blocker given potential adverse effects of long term PPI use. -d/c protonix -start pepcid  10mg  BID  Essential hypertension BP adequately controlled on current regimen. Systolic slightly elevated in the office today 146/67, although majority of home readings are within goal range. HR seemingly improved with reduced dose of metoprolol. -Continue amlodipine 10mg  daily, benazepril 20mg  daily, and metoprolol 12.5mg  BID     Alcus Dad, MD [place]

## 2022-01-10 NOTE — Patient Instructions (Addendum)
It was great to see you!  Things we discussed at today's visit: -Your blood sugar readings are excellent. You do not need to check your sugar every day. You can check it once per week or even once every other week instead. -Your blood pressure readings look good as well. We will not make any changes to your blood pressure medicines today.  -You can stop taking your pantoprazole (Protonix) and start a new medication called famotidine (Pepcid) instead. Take one pill twice daily.   Take care and seek immediate care sooner if you develop any concerns.  Dr[facility]

## 2022-01-12 NOTE — Assessment & Plan Note (Addendum)
Very well controlled. A1c 6.1% today. A1c has remained in prediabetic range for several years with diet control alone. Advised patient she does not need to check sugar daily (can check once every other week if she feels inclined). Will plan to repeat A1c in 6 months to 1 year (does not need every 3 months).

## 2022-01-12 NOTE — Assessment & Plan Note (Signed)
Well controlled. Will switch from PPI to H2 blocker given potential adverse effects of long term PPI use. -d/c protonix -start pepcid 10mg  BID

## 2022-01-12 NOTE — Assessment & Plan Note (Signed)
BP adequately controlled on current regimen. Systolic slightly elevated in the office today 146/67, although majority of home readings are within goal range. HR seemingly improved with reduced dose of metoprolol. -Continue amlodipine 10mg  daily, benazepril 20mg  daily, and metoprolol 12.5mg  BID

## 2022-02-17 ENCOUNTER — Other Ambulatory Visit: Payer: Self-pay | Admitting: Cardiology

## 2022-02-17 ENCOUNTER — Ambulatory Visit: Payer: Medicare Other | Admitting: Cardiology

## 2022-02-19 NOTE — Progress Notes (Signed)
Cardiology Office Note   Date:  02/20/2022   ID:  Heidi Wolf, Heidi Wolf July 22, 1941, MRN [id number]  PCP:  Eulis Foster, MD  Cardiologist:   Minus Breeding, MD   Chief Complaint  Patient presents with   Cardiomyopathy      History of Present Illness: Heidi Wolf is a 81 y.o. female who presents for follow up of CAD s/p Dx PCI/ DES 07/03/16. Re look 07/15/16 showed patent stent. She had no other significant CAD and LV wall motion was normal at cath. She complained of intermittent palpitations. A Holter monitor showed PACs and her beta blocker was increased.  Since I last saw her she was in the hospital with chest pain.  She had negative cardiac enzymes and her echo showed no significant wall motion abnormalities.  She did have a suggestion of some hypertrophy of the apical segment.   She had an echo with evidence of apical hypertrophy.  This was unchanged from previous.    Since last seen she has well other than one ER visit in October.  This was for jaw discomfort and on the right side.  This radiated it seemed into her right arm and then down her leg.  She felt weak.  There was some description of chest discomfort.  I reviewed these records for this visit.  There were no objective findings of ischemia enzymes were negative and EKG was unremarkable.  She has since had none of that.  She otherwise was doing fine.  She did have some lower heart rates and had her beta-blocker reduced slightly.  She brings me an extensive blood pressure diary which I reviewed and she was running high in the mornings up until the last few weeks when her blood pressures have been quite normal.   Past Medical History:  Diagnosis Date   Allergy    Anemia    Anxiety    Arthritis    "right hip" (09/21/2014)   CAD (coronary artery disease)    PCI 90% diagonal stenosis witha DES July 2017.  Nonobstructive disease elsewhere.    Cataracts, both eyes    Colon polyps    Hyperplastic 2003    External hemorrhoid    GERD (gastroesophageal reflux disease)    Glaucoma    Hyperlipidemia    Hypertension    Internal hemorrhoid    Knee effusion, right 03/05/2020   Myocardial infarction [facility])    Palpitations 09/03/2016   Holter Aug 2017- PACs, no arrhythmia.    Personal history of colonic polyps 03/03/2012   Type II diabetes mellitus ([place])    type 2   Whiplash injury, acute, sequela 03/05/2020    Past Surgical History:  Procedure Laterality Date   ABDOMINAL HYSTERECTOMY     APPENDECTOMY     "w/gallbladder"   CARDIAC CATHETERIZATION  X 2   CARDIAC CATHETERIZATION N/A 07/03/2016   Procedure: Left Heart Cath and Coronary Angiography;  Surgeon: Dixie Dials, MD;  Location: [place] [facility];  Service: Cardiovascular;  Laterality: N/A;   CARDIAC CATHETERIZATION N/A 07/03/2016   Procedure: Coronary Stent Intervention;  Surgeon: Jettie Booze, MD;  Location: [place] [facility];  Service: Cardiovascular;  Laterality: N/A;   CARDIAC CATHETERIZATION N/A 07/15/2016   Procedure: Left Heart Cath and Coronary Angiography;  Surgeon: Dixie Dials, MD;  Location: [place] [facility];  Service: Cardiovascular;  Laterality: N/A;   CHOLECYSTECTOMY     COLONOSCOPY     CORONARY ANGIOPLASTY WITH STENT PLACEMENT  06/2016  TUBAL LIGATION  1971     Current Outpatient Medications  Medication Sig Dispense Refill   Accu-Chek Softclix Lancets lancets Daily as needed. 100 each 3   aspirin EC 81 MG tablet Take 1 tablet (81 mg total) by mouth daily.     CHOLECALCIFEROL PO Take 1,000 Units by mouth.     famotidine (PEPCID) 10 MG tablet Take 1 tablet (10 mg total) by mouth 2 (two) times daily. 180 tablet 0   gabapentin (NEURONTIN) 100 MG capsule Take 1 capsule (100 mg total) by mouth 2 (two) times daily as needed. 30 capsule 1   glucose blood (ACCU-CHEK AVIVA PLUS) test strip Check blood sugar up to once daily as needed. 100 each 3   LUMIGAN 0.01 % SOLN Place 1 drop into both eyes at bedtime.       polyethylene glycol powder (GLYCOLAX/MIRALAX) 17 GM/SCOOP powder Take 17 g by mouth daily as needed. 289 g 1   potassium chloride SA (KLOR-CON) 20 MEQ tablet Take 1 tablet (20 mEq total) by mouth daily. 90 tablet 3   rosuvastatin (CRESTOR) 40 MG tablet TAKE 1 TABLET(40 MG) BY MOUTH DAILY 90 tablet 3   trolamine salicylate (ASPERCREME) 10 % cream Apply 1 application topically as needed for muscle pain.     amLODipine (NORVASC) 10 MG tablet Take 1 tablet (10 mg total) by mouth daily. 90 tablet 3   benazepril (LOTENSIN) 20 MG tablet Take 1 tablet (20 mg total) by mouth daily. 90 tablet 3   metoprolol tartrate (LOPRESSOR) 25 MG tablet Take 0.5 tablets (12.5 mg total) by mouth 2 (two) times daily. 90 tablet 3   nitroGLYCERIN (NITROSTAT) 0.4 MG SL tablet Place 1 tablet (0.4 mg total) under the tongue every 5 (five) minutes as needed for chest pain. 25 tablet 3   No current facility-administered medications for this visit.    Allergies:   Aspirin   ROS:  Please see the history of present illness.   Otherwise, review of systems are positive none.   All other systems are reviewed and negative.    PHYSICAL EXAM: VS:  BP 140/60    Pulse 61    Ht 5\' 4"  (1.626 m)    Wt 163 lb (73.9 kg)    SpO2 98%    BMI 27.98 kg/m  , BMI Body mass index is 27.98 kg/m.  GENERAL:  Well appearing NECK:  No jugular venous distention, waveform within normal limits, carotid upstroke brisk and symmetric, no bruits, no thyromegaly LUNGS:  Clear to auscultation bilaterally CHEST:  Unremarkable HEART:  PMI not displaced or sustained,S1 and S2 within normal limits, no S3, no S4, no clicks, no rubs, 2 out of 6 apical systolic murmur not increasing with the strain phase of Valsalva, no diastolic murmurs ABD:  Flat, positive bowel sounds normal in frequency in pitch, no bruits, no rebound, no guarding, no midline pulsatile mass, no hepatomegaly, no splenomegaly EXT:  2 plus pulses throughout, no edema, no cyanosis no  clubbing   EKG:  EKG is not ordered today. EKG demonstrates sinus bradycardia, rate 70 left ventricular hypertrophy by voltage criteria with repolarization changes and no change from previous.  10/12/2021  Recent Labs: 10/12/2021: ALT 22; BUN 18; Creatinine, Ser 0.99; Hemoglobin 12.9; Platelets 292; Potassium 3.6; Sodium 139    Lipid Panel    Component Value Date/Time   CHOL 116 05/06/2021 0923   TRIG 59 05/06/2021 0923   HDL 51 05/06/2021 0923   CHOLHDL 2.3 05/06/2021 7124  CHOLHDL 4.5 02/15/2020 0433   VLDL 28 02/15/2020 0433   LDLCALC 52 05/06/2021 0923     Lab Results  Component Value Date   HGBA1C 6.1 01/10/2022    Wt Readings from Last 3 Encounters:  02/20/22 163 lb (73.9 kg)  01/10/22 163 lb (73.9 kg)  12/31/21 162 lb (73.5 kg)      Other studies Reviewed: Additional studies/ records that were reviewed today include: ED records Review of the above records demonstrates: See elsewhere   ASSESSMENT AND PLAN:  CAD:   The patient has no new sypmtoms.  No further cardiovascular testing is indicated.  We will continue with aggressive risk reduction and meds as listed.  I did extensively review the ED records and see no cardiac etiology and since she is not having further symptoms I would not change or suggest further testing.  ABNORMAL ECHO: There was some apical hypertrophy.   However, she is having no symptoms related to this.  No further imaging is indicated.  Her children have been screened.    HTN:   The blood pressure is at target by her extensive meticulous records.  No change in therapy.  DM: A1c is 6.1.  No change in therapy.  5.8.  No change in therapy.    HYPERLIPIDEMIA:     LDL was 59 with an HDL of 51 in May of last year.  No change in therapy. \  Current medicines are reviewed at length with the patient today.  The patient does not have concerns regarding medicines.  The following changes have been made:   None  Labs/ tests ordered today include:    None  No orders of the defined types were placed in this encounter.    Disposition:   FU with me in 12 months.     Signed, Minus Breeding, MD  02/20/2022 11:34 AM    [facility]

## 2022-02-20 ENCOUNTER — Other Ambulatory Visit: Payer: Self-pay

## 2022-02-20 ENCOUNTER — Encounter: Payer: Self-pay | Admitting: Cardiology

## 2022-02-20 ENCOUNTER — Ambulatory Visit (INDEPENDENT_AMBULATORY_CARE_PROVIDER_SITE_OTHER): Payer: Medicare Other | Admitting: Cardiology

## 2022-02-20 VITALS — BP 140/60 | HR 61 | Ht 64.0 in | Wt 163.0 lb

## 2022-02-20 DIAGNOSIS — I10 Essential (primary) hypertension: Secondary | ICD-10-CM | POA: Diagnosis not present

## 2022-02-20 DIAGNOSIS — E785 Hyperlipidemia, unspecified: Secondary | ICD-10-CM | POA: Diagnosis not present

## 2022-02-20 DIAGNOSIS — I2510 Atherosclerotic heart disease of native coronary artery without angina pectoris: Secondary | ICD-10-CM | POA: Diagnosis not present

## 2022-02-20 DIAGNOSIS — E118 Type 2 diabetes mellitus with unspecified complications: Secondary | ICD-10-CM | POA: Diagnosis not present

## 2022-02-20 MED ORDER — AMLODIPINE BESYLATE 10 MG PO TABS: 10.0000 mg | ORAL_TABLET | Freq: Every day | ORAL | 3 refills | Status: DC

## 2022-02-20 MED ORDER — BENAZEPRIL HCL 20 MG PO TABS: 20.0000 mg | ORAL_TABLET | Freq: Every day | ORAL | 3 refills | Status: DC

## 2022-02-20 MED ORDER — NITROGLYCERIN 0.4 MG SL SUBL: 0.4000 mg | SUBLINGUAL_TABLET | SUBLINGUAL | 3 refills | Status: DC | PRN

## 2022-02-20 MED ORDER — METOPROLOL TARTRATE 25 MG PO TABS: 12.5000 mg | ORAL_TABLET | Freq: Two times a day (BID) | ORAL | 3 refills | Status: DC

## 2022-02-20 NOTE — Patient Instructions (Signed)
Medication Instructions:  °Your physician recommends that you continue on your current medications as directed. Please refer to the Current Medication list given to you today. ° °*If you need a refill on your cardiac medications before your next appointment, please call your pharmacy* ° °Lab Work: °NONE ordered at this time of appointment  ° °If you have labs (blood work) drawn today and your tests are completely normal, you will receive your results only by: °MyChart Message (if you have MyChart) OR °A paper copy in the mail °If you have any lab test that is abnormal or we need to change your treatment, we will call you to review the results. ° °Testing/Procedures: °NONE ordered at this time of appointment  ° °Follow-Up: °At [facility], you and your health needs are our priority.  As part of our continuing mission to provide you with exceptional heart care, we have created designated Provider Care Teams.  These Care Teams include your primary Cardiologist (physician) and Advanced Practice Providers (APPs -  Physician Assistants and Nurse Practitioners) who all work together to provide you with the care you need, when you need it. ° °We recommend signing up for the patient portal called "MyChart".  Sign up information is provided on this After Visit Summary.  MyChart is used to connect with patients for Virtual Visits (Telemedicine).  Patients are able to view lab/test results, encounter notes, upcoming appointments, etc.  Non-urgent messages can be sent to your provider as well.   °To learn more about what you can do with MyChart, go to [email or web address].   ° °Your next appointment:   °1 year(s) ° °The format for your next appointment:   °In Person ° °Provider:   °James Hochrein, MD   ° °Other Instructions ° °

## 2022-04-03 ENCOUNTER — Other Ambulatory Visit: Payer: Self-pay | Admitting: Cardiology

## 2022-04-15 DIAGNOSIS — E119 Type 2 diabetes mellitus without complications: Secondary | ICD-10-CM | POA: Diagnosis not present

## 2022-04-15 DIAGNOSIS — H401131 Primary open-angle glaucoma, bilateral, mild stage: Secondary | ICD-10-CM | POA: Diagnosis not present

## 2022-04-15 DIAGNOSIS — H2513 Age-related nuclear cataract, bilateral: Secondary | ICD-10-CM | POA: Diagnosis not present

## 2022-04-15 DIAGNOSIS — H25013 Cortical age-related cataract, bilateral: Secondary | ICD-10-CM | POA: Diagnosis not present

## 2022-04-18 ENCOUNTER — Other Ambulatory Visit: Payer: Self-pay | Admitting: Family Medicine

## 2022-04-20 NOTE — Progress Notes (Signed)
? ? ?  SUBJECTIVE:  ? ?CHIEF COMPLAINT / HPI: follow up for chronic illness  ? ?Hypertension ?Patient states that she is adherent to medication regimen. She denies HA or blurry vision.  ? ?Type 2 diabetes ? ?Current Regimen: diet controlled  ?CBGs: checks at home, today was 113, Sat and Sun 96- 97  ?  ?Last A1c:  ?Lab Results  ?Component Value Date  ? HGBA1C 5.9 04/21/2022  ?  ?Denies polyuria, polydipsia, hypoglycemia: denies  ?Last Eye Exam: completed 4/18, no DM retinopathy  ?Statin: crestor  ?ACE/ARB: ACE benazepril  ?She is agreeable to foot exam today  ? ?HLD  ?Patient reports adherence to statin therapy. She is agreeable to collecting lipid panel today. Reports regular follow up with her cardiologist.  ? ?PERTINENT  PMH / PSH: Type 2 diabetes ?GERD ?Right hemiparesis ?Arthritis ?Dyslipidemia ?History of colonic polyps ?Hypertension ?CAD status post PCI in 2017 ? ?OBJECTIVE:  ? ?BP (!) 141/68   Pulse (!) 55   Wt 162 lb 6.4 oz (73.7 kg)   SpO2 100%   BMI 27.88 kg/m?   ?Physical Exam ?Constitutional:   ?   Appearance: She is normal weight.  ?HENT:  ?   Right Ear: External ear normal.  ?   Left Ear: External ear normal.  ?   Nose: Nose normal.  ?   Mouth/Throat:  ?   Pharynx: Oropharynx is clear.  ?Eyes:  ?   General: No scleral icterus. ?   Conjunctiva/sclera: Conjunctivae normal.  ?Cardiovascular:  ?   Rate and Rhythm: Normal rate and regular rhythm.  ?Pulmonary:  ?   Effort: Pulmonary effort is normal.  ?Abdominal:  ?   General: Bowel sounds are normal.  ?   Palpations: Abdomen is soft.  ?Musculoskeletal:  ?   Cervical back: Normal range of motion.  ?Skin: ?   General: Skin is warm.  ?   Capillary Refill: Capillary refill takes less than 2 seconds.  ?   Findings: No rash.  ?Neurological:  ?   General: No focal deficit present.  ?   Mental Status: She is alert and oriented to person, place, and time.  ? ? ?ASSESSMENT/PLAN:  ? ?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[place]) ?Repeated hgb A1c today   ?Well controlled, 5.9 from 6.1  ?Continue diet control for DM  ? ?Dyslipidemia ?Will repeat lipid panel today  ?patietn to continue crestor '40mg'$  daily  ? ?Essential hypertension ?Well controlled, BP at goal today  ?Continue benazepril '20mg'$  daily  ?Continue metoprolol '25mg'$   ?Reviewed last BMP, WNL  ?  ? ? ?Eulis Foster, MD ?[place]  ?

## 2022-04-21 ENCOUNTER — Ambulatory Visit (INDEPENDENT_AMBULATORY_CARE_PROVIDER_SITE_OTHER): Payer: Medicare Other | Admitting: Family Medicine

## 2022-04-21 ENCOUNTER — Encounter: Payer: Self-pay | Admitting: Family Medicine

## 2022-04-21 ENCOUNTER — Other Ambulatory Visit: Payer: Self-pay

## 2022-04-21 VITALS — BP 141/68 | HR 55 | Wt 162.4 lb

## 2022-04-21 DIAGNOSIS — I2510 Atherosclerotic heart disease of native coronary artery without angina pectoris: Secondary | ICD-10-CM | POA: Diagnosis not present

## 2022-04-21 DIAGNOSIS — E1159 Type 2 diabetes mellitus with other circulatory complications: Secondary | ICD-10-CM

## 2022-04-21 DIAGNOSIS — E785 Hyperlipidemia, unspecified: Secondary | ICD-10-CM

## 2022-04-21 DIAGNOSIS — I10 Essential (primary) hypertension: Secondary | ICD-10-CM

## 2022-04-21 LAB — POCT GLYCOSYLATED HEMOGLOBIN (HGB A1C): HbA1c, POC (controlled diabetic range): 5.9 % (ref 0.0–7.0)

## 2022-04-21 MED ORDER — ACCU-CHEK AVIVA PLUS VI STRP: ORAL_STRIP | 2 refills | Status: DC

## 2022-04-21 NOTE — Patient Instructions (Addendum)
It was a pleasure to see you today! ? ?Thank you for choosing [facility] for your primary care.  ? ?Heidi Wolf was seen for diabetes and hypertension.  ? ?Our plans for today were: ?Your hemoglobin A1c was 5.9 today.  Keep up the great work with your diet control your diabetes.  We will not add any medications at this time. ?We will check your cholesterol today.  Please continue to take your Crestor daily. ?Please also continue your blood pressure medications as prescribed. ? ?To keep you healthy, please keep in mind the following health maintenance items that you are due for:  ? ?Shingles vaccine- You can get this at your local pharmacy.  ? ? ?You should return to our clinic in 3 months for follow up for diabetes and high blood pressure.  ? ?Best Wishes,  ? ?Dr. Alba Cory  ? ?

## 2022-04-22 LAB — LIPID PANEL
Chol/HDL Ratio: 2.6 ratio (ref 0.0–4.4)
Cholesterol, Total: 113 mg/dL (ref 100–199)
HDL: 44 mg/dL (ref >39)
LDL Chol Calc (NIH): 52 mg/dL (ref 0–99)
Triglycerides: 88 mg/dL (ref 0–149)
VLDL Cholesterol Cal: 17 mg/dL (ref 5–40)

## 2022-04-22 NOTE — Assessment & Plan Note (Signed)
Will repeat lipid panel today  ?patietn to continue crestor '40mg'$  daily  ?

## 2022-04-22 NOTE — Assessment & Plan Note (Signed)
Well controlled, BP at goal today  ?Continue benazepril '20mg'$  daily  ?Continue metoprolol '25mg'$   ?Reviewed last BMP, WNL  ?

## 2022-04-22 NOTE — Assessment & Plan Note (Signed)
Repeated hgb A1c today  ?Well controlled, 5.9 from 6.1  ?Continue diet control for DM  ?

## 2022-05-07 DIAGNOSIS — Z20822 Contact with and (suspected) exposure to covid-19: Secondary | ICD-10-CM | POA: Diagnosis not present

## 2022-06-02 ENCOUNTER — Ambulatory Visit
Admission: RE | Admit: 2022-06-02 | Discharge: 2022-06-02 | Disposition: A | Discharge status: Home / Self Care | Payer: Medicare Other | Source: Ambulatory Visit | Attending: Family Medicine | Admitting: Family Medicine

## 2022-06-02 ENCOUNTER — Ambulatory Visit (INDEPENDENT_AMBULATORY_CARE_PROVIDER_SITE_OTHER): Payer: Medicare Other | Admitting: Family Medicine

## 2022-06-02 VITALS — BP 129/57 | HR 58 | Ht 64.0 in | Wt 157.1 lb

## 2022-06-02 DIAGNOSIS — I2510 Atherosclerotic heart disease of native coronary artery without angina pectoris: Secondary | ICD-10-CM | POA: Diagnosis not present

## 2022-06-02 DIAGNOSIS — M545 Low back pain, unspecified: Secondary | ICD-10-CM | POA: Diagnosis not present

## 2022-06-02 DIAGNOSIS — M25551 Pain in right hip: Secondary | ICD-10-CM | POA: Diagnosis not present

## 2022-06-02 MED ORDER — DICLOFENAC SODIUM 75 MG PO TBEC: 75.0000 mg | DELAYED_RELEASE_TABLET | Freq: Two times a day (BID) | ORAL | 0 refills | Status: DC

## 2022-06-02 NOTE — Progress Notes (Signed)
    SUBJECTIVE:   CHIEF COMPLAINT / HPI: hip pain   Patient presents with complaint of right hip pain that started yesterday morning.  She states the she experienced weakness in her right leg and has had to use a Bugge and cane to help support her while ambulating.  Patient states that she normally ambulates independently and does not need assisted devices.  She reports that the pain starts in her right buttocks and radiates to the lateral in the front of her leg and travels down to her foot.  She reports that her toes feel stiff.  She states that she took gabapentin yesterday and it helps to relieve the pain a little.  She states that this morning she woke up with continued pain and took 2 arthritis Tylenol 650 mg pills.  She states that she also had the right side of her lips feel numb yesterday.  Patient states that she is based on did not notice any facial drooping.  She denies any episode of confusion.  Stated that her blood pressure was 122/61 at that time and heart rate was 56.  Patient reports that she sees a physician for knee injections on the right leg and was not able to get in to the office so decided to seek care with her PCP today.  She states that she feels as if her right leg is dragging due to the weakness.   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 She is not ill-appearing.  Musculoskeletal:        General: Tenderness present. No signs of injury.     Right hip: Tenderness present. Decreased strength.     Left hip: Decreased strength.     Right lower leg: No edema.     Left lower leg: No edema.     Comments: 4/5 strength bilaterally in  lower extremities Sensation intact bilaterally   Neurological:     Mental Status: She is alert and oriented to person, place, and time.     Cranial Nerves: Cranial nerves 2-12 are intact. No cranial nerve deficit, dysarthria or facial asymmetry.     Sensory: Sensation is intact.     Motor: No abnormal muscle tone.     Coordination: Coordination is intact.     Comments: Antalgic gait      ASSESSMENT/PLAN:   Pain of right hip Hip pain could be related to osteoarthritis, nerve impingement from vertebral arthritis vs stenosis.  Will collect hip and lumbar XR  Recommend heat therapy for 20 minute intervals as tolerated throughout the day  Continue tylenol arthritis PRN  Diclofenac '75mg'$  BID for 7 days      Eulis Foster, MD [place]

## 2022-06-02 NOTE — Patient Instructions (Signed)
HelpI have prescribed a medicine to take by mouth twice daily for 7 days with the hip pain.  I also recommend that she continue with the gabapentin to help with any component of nerve pain.  I also recommended she try sitting or lying on a heating pad for 20-minute intervals to help with the hip pain.  I will send you to x-ray of your hip and back to see if there is any component of arthritis contributing to your pain.  Please monitor for any worsening weakness on the right side, numbness or if you begin to have trouble swallowing or speaking or feeling confused.  If you notice any of the signs, I recommend that you be evaluated in the emergency department as soon as possible.

## 2022-06-04 DIAGNOSIS — M25551 Pain in right hip: Secondary | ICD-10-CM | POA: Insufficient documentation

## 2022-06-04 MED ORDER — ACCU-CHEK AVIVA PLUS VI STRP: ORAL_STRIP | 3 refills | Status: DC

## 2022-06-04 NOTE — Assessment & Plan Note (Signed)
Hip pain could be related to osteoarthritis, nerve impingement from vertebral arthritis vs stenosis.  Will collect hip and lumbar XR  Recommend heat therapy for 20 minute intervals as tolerated throughout the day  Continue tylenol arthritis PRN  Diclofenac '75mg'$  BID for 7 days

## 2022-06-10 ENCOUNTER — Telehealth: Payer: Self-pay

## 2022-06-10 NOTE — Telephone Encounter (Signed)
Patient calls nurse line requesting results of xray.   Patient reports continued hip pain. Patient has completed course of diclofenac and requesting a refill as this helped with pain.   Patient scheduled for FU on 6/20 with PCP.   Will forward to PCP.

## 2022-06-11 ENCOUNTER — Other Ambulatory Visit: Payer: Self-pay | Admitting: Family Medicine

## 2022-06-11 DIAGNOSIS — M5090 Cervical disc disorder, unspecified, unspecified cervical region: Secondary | ICD-10-CM

## 2022-06-11 MED ORDER — DICLOFENAC SODIUM 75 MG PO TBEC: 75.0000 mg | DELAYED_RELEASE_TABLET | Freq: Every day | ORAL | 0 refills | Status: DC

## 2022-06-11 MED ORDER — GABAPENTIN 100 MG PO CAPS: 100.0000 mg | ORAL_CAPSULE | Freq: Two times a day (BID) | ORAL | 2 refills | Status: DC | PRN

## 2022-06-11 NOTE — Progress Notes (Signed)
Contacted patient to discuss results of hip and lumbar XR Patient reports that she continues to have pain but was better after diclofenac and gabapentin  She states that pain is worse first thing in the AM   Recommended additional 7 day course of diclofenac  Refilled gabapentin for 60 tabs and two refills

## 2022-06-13 ENCOUNTER — Ambulatory Visit: Payer: Medicare Other | Admitting: Family Medicine

## 2022-06-17 ENCOUNTER — Encounter: Payer: Self-pay | Admitting: Family Medicine

## 2022-06-17 ENCOUNTER — Ambulatory Visit (INDEPENDENT_AMBULATORY_CARE_PROVIDER_SITE_OTHER): Payer: Medicare Other | Admitting: Family Medicine

## 2022-06-17 VITALS — BP 128/65 | HR 48 | Ht 64.0 in | Wt 167.2 lb

## 2022-06-17 DIAGNOSIS — R208 Other disturbances of skin sensation: Secondary | ICD-10-CM

## 2022-06-17 DIAGNOSIS — M25551 Pain in right hip: Secondary | ICD-10-CM | POA: Diagnosis not present

## 2022-06-17 DIAGNOSIS — I2510 Atherosclerotic heart disease of native coronary artery without angina pectoris: Secondary | ICD-10-CM | POA: Diagnosis not present

## 2022-06-17 NOTE — Assessment & Plan Note (Signed)
Suspect 2/2 to neuropathy vs nerve impingement of known DDD in lumbar spine  Recommended continued gabapentin use

## 2022-06-17 NOTE — Patient Instructions (Signed)
For your hip pain, recommend continuing the rehabilitation exercises you have been doing, taking Tylenol as needed for pain as well as doing heat therapy.  I recommend discussing this hip pain at your upcoming appointment with Dr. Letta Pate in July.  I will also send a message to see if you will be appropriate for injections to help with your hip pain.  Today, I will submit a referral to orthopedic surgery to see if there are additional options as well as to see if you would be a candidate for hip replacement surgery in the future.

## 2022-06-17 NOTE — Assessment & Plan Note (Signed)
Will submit referral for orthopedic evaluation as suspect patient may qualify for replacement in future  Will also contact patient's PMR provider for consideration of joint injection for the hip to help with pain relief as she has upcoming appointment in July  Hesitant to continue NSAIDS for OA due to hx of CAD s/p stent placement (she has completed ~3 weeks of diclofenac therapy)

## 2022-06-17 NOTE — Progress Notes (Signed)
    SUBJECTIVE:   CHIEF COMPLAINT / HPI: f/u for hip pain   Patient reports continued right hip pain that is improved with gabapentin  She was treated with 14 day course of diclofenac that eased the pain some, she reports  XR of the hips and lumbar regions showed degenerative disease of lumbar and OA of bilateral hips  Since her last visit in May, she reports she continues to have right hip pain that radiates down her right leg into her foot  Pain is worse first thing in the AM  She has been taking gabapentin and the diclofenac  She states that she tries to stay mobile and not stiff   She also reports new left foot pain and stiffness that occurs in the evenings   She reports that she has a sensation of burning in the left foot that was improved with use of gabapentin She states she has been using old exercises from rehab to work her right leg    PERTINENT  PMH / PSH:  Right hip pain   OBJECTIVE:   BP 128/65   Pulse (!) 48   Ht '5\' 4"'$  (1.626 m)   Wt 167 lb 3.2 oz (75.8 kg)   SpO2 96%   BMI 28.70 kg/m   Hip:  - Inspection: No gross deformity, no swelling, erythema, or ecchymosis - Palpation:  TTP in right gluteal region and right lumbar region - ROM: Normal range of motion on Flexion, extension, abduction, internal and external rotation - Strength: Normal strength. - Neuro/vasc: NV intact distally Ambulating with assistance of cane  Negative straight leg testing    ASSESSMENT/PLAN:   Burning sensation of foot Suspect 2/2 to neuropathy vs nerve impingement of known DDD in lumbar spine  Recommended continued gabapentin use   Pain of right hip Will submit referral for orthopedic evaluation as suspect patient may qualify for replacement in future  Will also contact patient's PMR provider for consideration of joint injection for the hip to help with pain relief as she has upcoming appointment in July  Hesitant to continue NSAIDS for OA due to hx of CAD s/p stent placement  (she has completed ~3 weeks of diclofenac therapy)      Eulis Foster, MD [place]

## 2022-06-26 NOTE — Progress Notes (Signed)
Office Visit Note   Patient: Heidi Wolf           Date of Birth: 11/21/1941           MRN: [id number] Visit Date: 06/27/2022              Requested by: McDiarmid, Blane Ohara, MD [address] [place],  [place] [address] PCP: Eulis Foster, MD   Assessment & Plan: Visit Diagnoses:  1. Lumbar radiculopathy     Plan: Overall impression is lumbar radiculopathy.  She may have some contribution from her hip.  Condition reviewed and treatment options discussed and we will start with prednisone Dosepak and referral to outpatient PT.  Follow-up as needed.  Follow-Up Instructions: No follow-ups on file.   Orders:  Orders Placed This Encounter  Procedures   Ambulatory referral to Physical Therapy   Meds ordered this encounter  Medications   predniSONE (STERAPRED UNI-PAK 21 TAB) 10 MG (21) TBPK tablet    Sig: Take as directed    Dispense:  21 tablet    Refill:  3      Procedures: No procedures performed   Clinical Data: No additional findings.   Subjective: Chief Complaint  Patient presents with   Lower Back - Pain    HPI And is 81 year old female here for right low back pain and right hip pain.  Woke up on 06/01/2022 with pain.  Radiates down the leg with tingling in the foot.  X-rays were taken in the PCP office.  Denies any red flag symptoms.  Review of Systems  Constitutional: Negative.   HENT: Negative.    Eyes: Negative.   Respiratory: Negative.    Cardiovascular: Negative.   Endocrine: Negative.   Musculoskeletal: Negative.   Neurological: Negative.   Hematological: Negative.   Psychiatric/Behavioral: Negative.    All other systems reviewed and are negative.   Objective: Vital Signs: There were no vitals taken for this visit.  Physical Exam Vitals and nursing note reviewed.  Constitutional:      Appearance: She is well-developed.  HENT:     Head: Atraumatic.     Nose: Nose normal.  Eyes:     Extraocular Movements:  Extraocular movements intact.  Cardiovascular:     Pulses: Normal pulses.  Pulmonary:     Effort: Pulmonary effort is normal.  Abdominal:     Palpations: Abdomen is soft.  Musculoskeletal:     Cervical back: Neck supple.  Skin:    General: Skin is warm.     Capillary Refill: Capillary refill takes less than 2 seconds.  Neurological:     Mental Status: She is alert. Mental status is at baseline.  Psychiatric:        Behavior: Behavior normal.        Thought Content: Thought content normal.        Judgment: Judgment normal.    Ortho Exam Examination of the lumbar spine shows slight tenderness to palpation.  Mildly decreased range of motion.  Negative sciatic tension signs. Examination of the right hip shows a slight pain with rotation and circumduction.  Lateral hip is nontender. Specialty Comments:  No specialty comments available.  Imaging: No results found.   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[place]) 10/02/2021   Stenosing tenosynovitis of finger of left hand 05/06/2021   Trigger finger, acquired 05/06/2021  Primary osteoarthritis of right knee 04/02/2021   Trigger finger, left 03/08/2021   Healthcare maintenance 11/07/2020   Generalized osteoarthritis of hand 04/13/2020   Thyroid nodule 04/09/2018   GERD (gastroesophageal reflux disease)    Cataracts, both eyes    Arthritis    Essential hypertension 09/03/2016   Dyslipidemia 09/03/2016   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[place]) 09/03/2016   CAD- S/P PCI July 2017 07/02/2016   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 09/21/2014   Personal history of colonic polyps 03/03/2012   Internal hemorrhoids without mention of complication 41/28/7867   Past Medical History:  Diagnosis Date   Allergy    Anemia    Anxiety    Arthritis    "right hip" (09/21/2014)   CAD (coronary  artery disease)    PCI 90% diagonal stenosis witha DES July 2017.  Nonobstructive disease elsewhere.    Cataracts, both eyes    Colon polyps    Hyperplastic 2003   External hemorrhoid    GERD (gastroesophageal reflux disease)    Glaucoma    Hyperlipidemia    Hypertension    Internal hemorrhoid    Knee effusion, right 03/05/2020   Myocardial infarction [facility])    Palpitations 09/03/2016   Holter Aug 2017- PACs, no arrhythmia.    Personal history of colonic polyps 03/03/2012   Type II diabetes mellitus (HCC)    type 2   Whiplash injury, acute, sequela 03/05/2020    Family History  Problem Relation Age of Onset   Diabetes Mother    Arthritis Mother    CAD Mother 51       CABG   CAD Brother    CAD Sister 33       CABG   Stroke Brother    Stroke Daughter    Diabetes Maternal Grandmother    Stroke Maternal Grandmother    Stroke Father    Heart failure Sister    Kidney failure Sister    Breast cancer Other    Colon cancer Neg Hx    Stomach cancer Neg Hx    Esophageal cancer Neg Hx    Pancreatic cancer Neg Hx    Rectal cancer Neg Hx     Past Surgical History:  Procedure Laterality Date   ABDOMINAL HYSTERECTOMY     APPENDECTOMY     "w/gallbladder"   CARDIAC CATHETERIZATION  X 2   CARDIAC CATHETERIZATION N/A 07/03/2016   Procedure: Left Heart Cath and Coronary Angiography;  Surgeon: Dixie Dials, MD;  Location: [place] [facility];  Service: Cardiovascular;  Laterality: N/A;   CARDIAC CATHETERIZATION N/A 07/03/2016   Procedure: Coronary Stent Intervention;  Surgeon: Jettie Booze, MD;  Location: [place] [facility];  Service: Cardiovascular;  Laterality: N/A;   CARDIAC CATHETERIZATION N/A 07/15/2016   Procedure: Left Heart Cath and Coronary Angiography;  Surgeon: Dixie Dials, MD;  Location: [place] [facility];  Service: Cardiovascular;  Laterality: N/A;   CHOLECYSTECTOMY     COLONOSCOPY     CORONARY ANGIOPLASTY WITH STENT PLACEMENT  06/2016   TUBAL LIGATION  1971   Social  History   Occupational History   Occupation: retired  Tobacco Use   Smoking status: Never   Smokeless tobacco: Never  Vaping Use   Vaping Use: Never used  Substance and Sexual Activity   Alcohol use: No   Drug use: No   Sexual activity: Never

## 2022-06-27 ENCOUNTER — Encounter: Payer: Self-pay | Admitting: Orthopaedic Surgery

## 2022-06-27 ENCOUNTER — Ambulatory Visit (INDEPENDENT_AMBULATORY_CARE_PROVIDER_SITE_OTHER): Payer: Medicare Other | Admitting: Orthopaedic Surgery

## 2022-06-27 DIAGNOSIS — I2510 Atherosclerotic heart disease of native coronary artery without angina pectoris: Secondary | ICD-10-CM

## 2022-06-27 DIAGNOSIS — M5416 Radiculopathy, lumbar region: Secondary | ICD-10-CM | POA: Diagnosis not present

## 2022-06-27 MED ORDER — PREDNISONE 10 MG (21) PO TBPK: ORAL_TABLET | ORAL | 3 refills | Status: DC

## 2022-07-03 ENCOUNTER — Ambulatory Visit: Payer: Medicare Other | Admitting: Physical Medicine & Rehabilitation

## 2022-07-08 ENCOUNTER — Encounter: Payer: Medicare Other | Attending: Physical Medicine & Rehabilitation | Admitting: Physical Medicine & Rehabilitation

## 2022-07-08 ENCOUNTER — Encounter: Payer: Self-pay | Admitting: Physical Medicine & Rehabilitation

## 2022-07-08 VITALS — BP 111/68 | HR 58 | Ht 64.0 in | Wt 164.2 lb

## 2022-07-08 DIAGNOSIS — I2510 Atherosclerotic heart disease of native coronary artery without angina pectoris: Secondary | ICD-10-CM | POA: Diagnosis not present

## 2022-07-08 DIAGNOSIS — M5416 Radiculopathy, lumbar region: Secondary | ICD-10-CM

## 2022-07-08 NOTE — Progress Notes (Signed)
Subjective:    Patient ID: Heidi Wolf, female    DOB: [birth year], 81 y.o.   MRN: [id number] 81 year old female with history of right chronic knee pain and swelling.  Interval history she has undergone x-ray of the right knee demonstrating moderate osteoarthritis as well as a mild effusion.  She has had no falls or new injuries over the last 2 months. She has undergone home health physical therapy and states that this has been very helpful in alleviating her right knee pain as well as increasing her mobility.  She was told by her physical therapist that her strength improved by 90%. The patient is using Tylenol arthritis 650 mg approximately twice a day.  In addition she is using Voltaren gel to her right knee 3 times a day. HPI Patient states she has had some increasing discomfort in her low back and bilateral legs.  Pain is worse with walking bending standing improves with rest heat and medication.  Pain scores are 7/10 some tingling in both legs as well.  No new weakness no loss of bowel or bladder function. Pain Inventory Average Pain 7 Pain Right Now 7 My pain is sharp, tingling, and aching  In the last 24 hours, has pain interfered with the following? General activity 8 Relation with others 6 Enjoyment of life 0 What TIME of day is your pain at its worst? morning , evening, and night Sleep (in general) Fair  Pain is worse with: walking, bending, standing, and some activites Pain improves with: rest, heat/ice, and medication Relief from Meds: 8  Family History  Problem Relation Age of Onset   Diabetes Mother    Arthritis Mother    CAD Mother 57       CABG   CAD Brother    CAD Sister 55       CABG   Stroke Brother    Stroke Daughter    Diabetes Maternal Grandmother    Stroke Maternal Grandmother    Stroke Father    Heart failure Sister    Kidney failure Sister    Breast cancer Other    Colon cancer Neg Hx    Stomach cancer Neg Hx    Esophageal cancer Neg Hx     Pancreatic cancer Neg Hx    Rectal cancer Neg Hx    Social History   Socioeconomic History   Marital status: Legally Separated    Spouse name: Not on file   Number of children: 3   Years of education: Not on file   Highest education level: Not on file  Occupational History   Occupation: retired  Tobacco Use   Smoking status: Never   Smokeless tobacco: Never  Vaping Use   Vaping Use: Never used  Substance and Sexual Activity   Alcohol use: No   Drug use: No   Sexual activity: Never  Other Topics Concern   Not on file  Social History Narrative   LIves with Tammy   Social Determinants of Health   Financial Resource Strain: Not on file  Food Insecurity: Not on file  Transportation Needs: Not on file  Physical Activity: Not on file  Stress: Not on file  Social Connections: Not on file   Past Surgical History:  Procedure Laterality Date   ABDOMINAL HYSTERECTOMY     APPENDECTOMY     "w/gallbladder"   CARDIAC CATHETERIZATION  X 2   CARDIAC CATHETERIZATION N/A 07/03/2016   Procedure: Left Heart Cath and Coronary Angiography;  Surgeon: Vicenta Aly  Doylene Canard, MD;  Location: [place] [facility];  Service: Cardiovascular;  Laterality: N/A;   CARDIAC CATHETERIZATION N/A 07/03/2016   Procedure: Coronary Stent Intervention;  Surgeon: Jettie Booze, MD;  Location: [place] [facility];  Service: Cardiovascular;  Laterality: N/A;   CARDIAC CATHETERIZATION N/A 07/15/2016   Procedure: Left Heart Cath and Coronary Angiography;  Surgeon: Dixie Dials, MD;  Location: [place] [facility];  Service: Cardiovascular;  Laterality: N/A;   CHOLECYSTECTOMY     COLONOSCOPY     CORONARY ANGIOPLASTY WITH STENT PLACEMENT  06/2016   TUBAL LIGATION  1971   Past Surgical History:  Procedure Laterality Date   ABDOMINAL HYSTERECTOMY     APPENDECTOMY     "w/gallbladder"   CARDIAC CATHETERIZATION  X 2   CARDIAC CATHETERIZATION N/A 07/03/2016   Procedure: Left Heart Cath and Coronary Angiography;  Surgeon:  Dixie Dials, MD;  Location: [place] [facility];  Service: Cardiovascular;  Laterality: N/A;   CARDIAC CATHETERIZATION N/A 07/03/2016   Procedure: Coronary Stent Intervention;  Surgeon: Jettie Booze, MD;  Location: [place] [facility];  Service: Cardiovascular;  Laterality: N/A;   CARDIAC CATHETERIZATION N/A 07/15/2016   Procedure: Left Heart Cath and Coronary Angiography;  Surgeon: Dixie Dials, MD;  Location: [place] [facility];  Service: Cardiovascular;  Laterality: N/A;   CHOLECYSTECTOMY     COLONOSCOPY     CORONARY ANGIOPLASTY WITH STENT PLACEMENT  06/2016   TUBAL LIGATION  1971   Past Medical History:  Diagnosis Date   Allergy    Anemia    Anxiety    Arthritis    "right hip" (09/21/2014)   CAD (coronary artery disease)    PCI 90% diagonal stenosis witha DES July 2017.  Nonobstructive disease elsewhere.    Cataracts, both eyes    Colon polyps    Hyperplastic 2003   External hemorrhoid    GERD (gastroesophageal reflux disease)    Glaucoma    Hyperlipidemia    Hypertension    Internal hemorrhoid    Knee effusion, right 03/05/2020   Myocardial infarction [facility])    Palpitations 09/03/2016   Holter Aug 2017- PACs, no arrhythmia.    Personal history of colonic polyps 03/03/2012   Type II diabetes mellitus (HCC)    type 2   Whiplash injury, acute, sequela 03/05/2020   BP 111/68   Pulse (!) 58   Ht '5\' 4"'$  (1.626 m)   Wt 164 lb 3.2 oz (74.5 kg)   SpO2 97%   BMI 28.18 kg/m   Opioid Risk Score:   Fall Risk Score:  `1  Depression screen [facility] 2/9     07/08/2022    9:42 AM 06/17/2022   10:08 AM 06/02/2022   10:41 AM 04/21/2022   10:27 AM 01/10/2022   10:08 AM 11/01/2021    3:34 PM 10/16/2021   10:16 AM  Depression screen PHQ 2/9  Decreased Interest 0 1 0 0 0 0 0  Down, Depressed, Hopeless 0 0 0 0 0 0 0  PHQ - 2 Score 0 1 0 0 0 0 0  Altered sleeping  0 1 0 0  0  Tired, decreased energy  1 1 0 0  1  Change in appetite  1 0 0 0  0  Feeling bad or failure about yourself   0 0 0 0   0  Trouble concentrating  1 0 0 0  0  Moving slowly or fidgety/restless  1 0 0 0  0  Suicidal  thoughts  0 0 0 0  0  PHQ-9 Score  5 2 0 0  1  Difficult doing work/chores  Not difficult at all     Somewhat difficult     Review of Systems  Constitutional: Negative.   HENT: Negative.    Eyes: Negative.   Respiratory: Negative.    Cardiovascular: Negative.   Gastrointestinal: Negative.   Endocrine: Negative.   Genitourinary: Negative.   Musculoskeletal:  Positive for gait problem.  Skin: Negative.   Allergic/Immunologic: Negative.   Hematological: Negative.   Psychiatric/Behavioral: Negative.        Objective:   Physical Exam Vitals and nursing note reviewed.  Constitutional:      Appearance: She is obese.  HENT:     Head: Normocephalic and atraumatic.  Eyes:     Extraocular Movements: Extraocular movements intact.     Conjunctiva/sclera: Conjunctivae normal.     Pupils: Pupils are equal, round, and reactive to light.  Musculoskeletal:     Comments: Tenderness palpation) left lumbar paraspinal area. Negative straight leg raising bilaterally. No pain with hip range of motion no tenderness over the lateral aspect of the hip. Lumbar range of motion is reduced with flexion extension around 50% of normal.  Lateral bending is more painful to the right than on the left side.  Skin:    General: Skin is warm and dry.  Neurological:     General: No focal deficit present.     Mental Status: She is alert and oriented to person, place, and time.     Comments: Motor strength is 5/5 bilateral hip flexor knee extensor ankle dorsiflexor Sensation intact light touch Deep tendon reflexes are absent bilateral knees and ankles  Psychiatric:        Mood and Affect: Mood normal.        Behavior: Behavior normal.   Sacral-iliac testing is negative        Assessment & Plan:  93.  81 year old female with lumbar spondylosis as well as degenerative disc and lumbar spinal stenosis.  She has  increasing pain in the low back area that was nonresponsive to medication management.  She has undergone evaluation.  The problems are mainly radiculopathy related.  It is not a factor. PT is ordered.  Duration of symptoms greater than 6 weeks.  Order lumbar MRI, patient may be a candidate for epidural steroid injection

## 2022-07-14 DIAGNOSIS — K648 Other hemorrhoids: Secondary | ICD-10-CM | POA: Diagnosis not present

## 2022-07-14 DIAGNOSIS — M5090 Cervical disc disorder, unspecified, unspecified cervical region: Secondary | ICD-10-CM | POA: Diagnosis not present

## 2022-07-14 DIAGNOSIS — M19049 Primary osteoarthritis, unspecified hand: Secondary | ICD-10-CM | POA: Diagnosis not present

## 2022-07-14 DIAGNOSIS — H409 Unspecified glaucoma: Secondary | ICD-10-CM | POA: Diagnosis not present

## 2022-07-14 DIAGNOSIS — H269 Unspecified cataract: Secondary | ICD-10-CM | POA: Diagnosis not present

## 2022-07-14 DIAGNOSIS — E785 Hyperlipidemia, unspecified: Secondary | ICD-10-CM | POA: Diagnosis not present

## 2022-07-14 DIAGNOSIS — K219 Gastro-esophageal reflux disease without esophagitis: Secondary | ICD-10-CM | POA: Diagnosis not present

## 2022-07-14 DIAGNOSIS — K644 Residual hemorrhoidal skin tags: Secondary | ICD-10-CM | POA: Diagnosis not present

## 2022-07-14 DIAGNOSIS — I2510 Atherosclerotic heart disease of native coronary artery without angina pectoris: Secondary | ICD-10-CM | POA: Diagnosis not present

## 2022-07-14 DIAGNOSIS — E119 Type 2 diabetes mellitus without complications: Secondary | ICD-10-CM | POA: Diagnosis not present

## 2022-07-14 DIAGNOSIS — Z7952 Long term (current) use of systemic steroids: Secondary | ICD-10-CM | POA: Diagnosis not present

## 2022-07-14 DIAGNOSIS — M1611 Unilateral primary osteoarthritis, right hip: Secondary | ICD-10-CM | POA: Diagnosis not present

## 2022-07-14 DIAGNOSIS — Z7982 Long term (current) use of aspirin: Secondary | ICD-10-CM | POA: Diagnosis not present

## 2022-07-14 DIAGNOSIS — Z955 Presence of coronary angioplasty implant and graft: Secondary | ICD-10-CM | POA: Diagnosis not present

## 2022-07-14 DIAGNOSIS — F419 Anxiety disorder, unspecified: Secondary | ICD-10-CM | POA: Diagnosis not present

## 2022-07-14 DIAGNOSIS — Z86010 Personal history of colonic polyps: Secondary | ICD-10-CM | POA: Diagnosis not present

## 2022-07-14 DIAGNOSIS — D649 Anemia, unspecified: Secondary | ICD-10-CM | POA: Diagnosis not present

## 2022-07-14 DIAGNOSIS — M5416 Radiculopathy, lumbar region: Secondary | ICD-10-CM | POA: Diagnosis not present

## 2022-07-14 DIAGNOSIS — M1711 Unilateral primary osteoarthritis, right knee: Secondary | ICD-10-CM | POA: Diagnosis not present

## 2022-07-14 DIAGNOSIS — G8191 Hemiplegia, unspecified affecting right dominant side: Secondary | ICD-10-CM | POA: Diagnosis not present

## 2022-07-14 DIAGNOSIS — E041 Nontoxic single thyroid nodule: Secondary | ICD-10-CM | POA: Diagnosis not present

## 2022-07-14 DIAGNOSIS — I252 Old myocardial infarction: Secondary | ICD-10-CM | POA: Diagnosis not present

## 2022-07-14 DIAGNOSIS — I10 Essential (primary) hypertension: Secondary | ICD-10-CM | POA: Diagnosis not present

## 2022-07-14 DIAGNOSIS — Z9049 Acquired absence of other specified parts of digestive tract: Secondary | ICD-10-CM | POA: Diagnosis not present

## 2022-07-14 DIAGNOSIS — Z950 Presence of cardiac pacemaker: Secondary | ICD-10-CM | POA: Diagnosis not present

## 2022-07-15 DIAGNOSIS — H401131 Primary open-angle glaucoma, bilateral, mild stage: Secondary | ICD-10-CM | POA: Diagnosis not present

## 2022-07-17 DIAGNOSIS — G8191 Hemiplegia, unspecified affecting right dominant side: Secondary | ICD-10-CM | POA: Diagnosis not present

## 2022-07-17 DIAGNOSIS — M1611 Unilateral primary osteoarthritis, right hip: Secondary | ICD-10-CM | POA: Diagnosis not present

## 2022-07-17 DIAGNOSIS — M5090 Cervical disc disorder, unspecified, unspecified cervical region: Secondary | ICD-10-CM | POA: Diagnosis not present

## 2022-07-17 DIAGNOSIS — M5416 Radiculopathy, lumbar region: Secondary | ICD-10-CM | POA: Diagnosis not present

## 2022-07-17 DIAGNOSIS — M1711 Unilateral primary osteoarthritis, right knee: Secondary | ICD-10-CM | POA: Diagnosis not present

## 2022-07-17 DIAGNOSIS — M19049 Primary osteoarthritis, unspecified hand: Secondary | ICD-10-CM | POA: Diagnosis not present

## 2022-07-21 ENCOUNTER — Ambulatory Visit
Admission: RE | Admit: 2022-07-21 | Discharge: 2022-07-21 | Disposition: A | Discharge status: Home / Self Care | Payer: Medicare Other | Source: Ambulatory Visit | Attending: Physical Medicine & Rehabilitation | Admitting: Physical Medicine & Rehabilitation

## 2022-07-21 DIAGNOSIS — M5117 Intervertebral disc disorders with radiculopathy, lumbosacral region: Secondary | ICD-10-CM | POA: Diagnosis not present

## 2022-07-21 DIAGNOSIS — M48061 Spinal stenosis, lumbar region without neurogenic claudication: Secondary | ICD-10-CM | POA: Diagnosis not present

## 2022-07-21 DIAGNOSIS — M5416 Radiculopathy, lumbar region: Secondary | ICD-10-CM

## 2022-07-23 DIAGNOSIS — M19049 Primary osteoarthritis, unspecified hand: Secondary | ICD-10-CM | POA: Diagnosis not present

## 2022-07-23 DIAGNOSIS — G8191 Hemiplegia, unspecified affecting right dominant side: Secondary | ICD-10-CM | POA: Diagnosis not present

## 2022-07-23 DIAGNOSIS — M1611 Unilateral primary osteoarthritis, right hip: Secondary | ICD-10-CM | POA: Diagnosis not present

## 2022-07-23 DIAGNOSIS — M5090 Cervical disc disorder, unspecified, unspecified cervical region: Secondary | ICD-10-CM | POA: Diagnosis not present

## 2022-07-23 DIAGNOSIS — M1711 Unilateral primary osteoarthritis, right knee: Secondary | ICD-10-CM | POA: Diagnosis not present

## 2022-07-23 DIAGNOSIS — M5416 Radiculopathy, lumbar region: Secondary | ICD-10-CM | POA: Diagnosis not present

## 2022-07-25 DIAGNOSIS — M1611 Unilateral primary osteoarthritis, right hip: Secondary | ICD-10-CM | POA: Diagnosis not present

## 2022-07-25 DIAGNOSIS — M19049 Primary osteoarthritis, unspecified hand: Secondary | ICD-10-CM | POA: Diagnosis not present

## 2022-07-25 DIAGNOSIS — M5416 Radiculopathy, lumbar region: Secondary | ICD-10-CM | POA: Diagnosis not present

## 2022-07-25 DIAGNOSIS — G8191 Hemiplegia, unspecified affecting right dominant side: Secondary | ICD-10-CM | POA: Diagnosis not present

## 2022-07-25 DIAGNOSIS — M5090 Cervical disc disorder, unspecified, unspecified cervical region: Secondary | ICD-10-CM | POA: Diagnosis not present

## 2022-07-25 DIAGNOSIS — M1711 Unilateral primary osteoarthritis, right knee: Secondary | ICD-10-CM | POA: Diagnosis not present

## 2022-07-30 DIAGNOSIS — G8191 Hemiplegia, unspecified affecting right dominant side: Secondary | ICD-10-CM | POA: Diagnosis not present

## 2022-07-30 DIAGNOSIS — M5090 Cervical disc disorder, unspecified, unspecified cervical region: Secondary | ICD-10-CM | POA: Diagnosis not present

## 2022-07-30 DIAGNOSIS — M1711 Unilateral primary osteoarthritis, right knee: Secondary | ICD-10-CM | POA: Diagnosis not present

## 2022-07-30 DIAGNOSIS — M5416 Radiculopathy, lumbar region: Secondary | ICD-10-CM | POA: Diagnosis not present

## 2022-07-30 DIAGNOSIS — M1611 Unilateral primary osteoarthritis, right hip: Secondary | ICD-10-CM | POA: Diagnosis not present

## 2022-07-30 DIAGNOSIS — M19049 Primary osteoarthritis, unspecified hand: Secondary | ICD-10-CM | POA: Diagnosis not present

## 2022-08-01 DIAGNOSIS — M5416 Radiculopathy, lumbar region: Secondary | ICD-10-CM | POA: Diagnosis not present

## 2022-08-01 DIAGNOSIS — M1711 Unilateral primary osteoarthritis, right knee: Secondary | ICD-10-CM | POA: Diagnosis not present

## 2022-08-01 DIAGNOSIS — M19049 Primary osteoarthritis, unspecified hand: Secondary | ICD-10-CM | POA: Diagnosis not present

## 2022-08-01 DIAGNOSIS — G8191 Hemiplegia, unspecified affecting right dominant side: Secondary | ICD-10-CM | POA: Diagnosis not present

## 2022-08-01 DIAGNOSIS — M5090 Cervical disc disorder, unspecified, unspecified cervical region: Secondary | ICD-10-CM | POA: Diagnosis not present

## 2022-08-01 DIAGNOSIS — M1611 Unilateral primary osteoarthritis, right hip: Secondary | ICD-10-CM | POA: Diagnosis not present

## 2022-08-05 DIAGNOSIS — G8191 Hemiplegia, unspecified affecting right dominant side: Secondary | ICD-10-CM | POA: Diagnosis not present

## 2022-08-05 DIAGNOSIS — M1711 Unilateral primary osteoarthritis, right knee: Secondary | ICD-10-CM | POA: Diagnosis not present

## 2022-08-05 DIAGNOSIS — M19049 Primary osteoarthritis, unspecified hand: Secondary | ICD-10-CM | POA: Diagnosis not present

## 2022-08-05 DIAGNOSIS — M5416 Radiculopathy, lumbar region: Secondary | ICD-10-CM | POA: Diagnosis not present

## 2022-08-05 DIAGNOSIS — M1611 Unilateral primary osteoarthritis, right hip: Secondary | ICD-10-CM | POA: Diagnosis not present

## 2022-08-05 DIAGNOSIS — M5090 Cervical disc disorder, unspecified, unspecified cervical region: Secondary | ICD-10-CM | POA: Diagnosis not present

## 2022-08-08 DIAGNOSIS — M1611 Unilateral primary osteoarthritis, right hip: Secondary | ICD-10-CM | POA: Diagnosis not present

## 2022-08-08 DIAGNOSIS — M19049 Primary osteoarthritis, unspecified hand: Secondary | ICD-10-CM | POA: Diagnosis not present

## 2022-08-08 DIAGNOSIS — M5090 Cervical disc disorder, unspecified, unspecified cervical region: Secondary | ICD-10-CM | POA: Diagnosis not present

## 2022-08-08 DIAGNOSIS — M1711 Unilateral primary osteoarthritis, right knee: Secondary | ICD-10-CM | POA: Diagnosis not present

## 2022-08-08 DIAGNOSIS — M5416 Radiculopathy, lumbar region: Secondary | ICD-10-CM | POA: Diagnosis not present

## 2022-08-08 DIAGNOSIS — G8191 Hemiplegia, unspecified affecting right dominant side: Secondary | ICD-10-CM | POA: Diagnosis not present

## 2022-08-13 DIAGNOSIS — E119 Type 2 diabetes mellitus without complications: Secondary | ICD-10-CM | POA: Diagnosis not present

## 2022-08-13 DIAGNOSIS — E785 Hyperlipidemia, unspecified: Secondary | ICD-10-CM | POA: Diagnosis not present

## 2022-08-13 DIAGNOSIS — Z86010 Personal history of colonic polyps: Secondary | ICD-10-CM | POA: Diagnosis not present

## 2022-08-13 DIAGNOSIS — K648 Other hemorrhoids: Secondary | ICD-10-CM | POA: Diagnosis not present

## 2022-08-13 DIAGNOSIS — H269 Unspecified cataract: Secondary | ICD-10-CM | POA: Diagnosis not present

## 2022-08-13 DIAGNOSIS — G8191 Hemiplegia, unspecified affecting right dominant side: Secondary | ICD-10-CM | POA: Diagnosis not present

## 2022-08-13 DIAGNOSIS — H409 Unspecified glaucoma: Secondary | ICD-10-CM | POA: Diagnosis not present

## 2022-08-13 DIAGNOSIS — K644 Residual hemorrhoidal skin tags: Secondary | ICD-10-CM | POA: Diagnosis not present

## 2022-08-13 DIAGNOSIS — Z955 Presence of coronary angioplasty implant and graft: Secondary | ICD-10-CM | POA: Diagnosis not present

## 2022-08-13 DIAGNOSIS — M5416 Radiculopathy, lumbar region: Secondary | ICD-10-CM | POA: Diagnosis not present

## 2022-08-13 DIAGNOSIS — Z950 Presence of cardiac pacemaker: Secondary | ICD-10-CM | POA: Diagnosis not present

## 2022-08-13 DIAGNOSIS — I2510 Atherosclerotic heart disease of native coronary artery without angina pectoris: Secondary | ICD-10-CM | POA: Diagnosis not present

## 2022-08-13 DIAGNOSIS — I10 Essential (primary) hypertension: Secondary | ICD-10-CM | POA: Diagnosis not present

## 2022-08-13 DIAGNOSIS — I252 Old myocardial infarction: Secondary | ICD-10-CM | POA: Diagnosis not present

## 2022-08-13 DIAGNOSIS — D649 Anemia, unspecified: Secondary | ICD-10-CM | POA: Diagnosis not present

## 2022-08-13 DIAGNOSIS — M5090 Cervical disc disorder, unspecified, unspecified cervical region: Secondary | ICD-10-CM | POA: Diagnosis not present

## 2022-08-13 DIAGNOSIS — M1611 Unilateral primary osteoarthritis, right hip: Secondary | ICD-10-CM | POA: Diagnosis not present

## 2022-08-13 DIAGNOSIS — K219 Gastro-esophageal reflux disease without esophagitis: Secondary | ICD-10-CM | POA: Diagnosis not present

## 2022-08-13 DIAGNOSIS — Z9049 Acquired absence of other specified parts of digestive tract: Secondary | ICD-10-CM | POA: Diagnosis not present

## 2022-08-13 DIAGNOSIS — Z7982 Long term (current) use of aspirin: Secondary | ICD-10-CM | POA: Diagnosis not present

## 2022-08-13 DIAGNOSIS — E041 Nontoxic single thyroid nodule: Secondary | ICD-10-CM | POA: Diagnosis not present

## 2022-08-13 DIAGNOSIS — M1711 Unilateral primary osteoarthritis, right knee: Secondary | ICD-10-CM | POA: Diagnosis not present

## 2022-08-13 DIAGNOSIS — F419 Anxiety disorder, unspecified: Secondary | ICD-10-CM | POA: Diagnosis not present

## 2022-08-13 DIAGNOSIS — Z7952 Long term (current) use of systemic steroids: Secondary | ICD-10-CM | POA: Diagnosis not present

## 2022-08-13 DIAGNOSIS — M19049 Primary osteoarthritis, unspecified hand: Secondary | ICD-10-CM | POA: Diagnosis not present

## 2022-08-15 DIAGNOSIS — M5090 Cervical disc disorder, unspecified, unspecified cervical region: Secondary | ICD-10-CM | POA: Diagnosis not present

## 2022-08-15 DIAGNOSIS — M5416 Radiculopathy, lumbar region: Secondary | ICD-10-CM | POA: Diagnosis not present

## 2022-08-15 DIAGNOSIS — G8191 Hemiplegia, unspecified affecting right dominant side: Secondary | ICD-10-CM | POA: Diagnosis not present

## 2022-08-15 DIAGNOSIS — M1711 Unilateral primary osteoarthritis, right knee: Secondary | ICD-10-CM | POA: Diagnosis not present

## 2022-08-15 DIAGNOSIS — M19049 Primary osteoarthritis, unspecified hand: Secondary | ICD-10-CM | POA: Diagnosis not present

## 2022-08-15 DIAGNOSIS — M1611 Unilateral primary osteoarthritis, right hip: Secondary | ICD-10-CM | POA: Diagnosis not present

## 2022-08-19 ENCOUNTER — Encounter: Payer: Medicare Other | Attending: Physical Medicine & Rehabilitation | Admitting: Physical Medicine & Rehabilitation

## 2022-08-19 ENCOUNTER — Encounter: Payer: Self-pay | Admitting: Physical Medicine & Rehabilitation

## 2022-08-19 VITALS — BP 126/73 | HR 56 | Ht 64.0 in | Wt 163.0 lb

## 2022-08-19 DIAGNOSIS — M5416 Radiculopathy, lumbar region: Secondary | ICD-10-CM | POA: Diagnosis not present

## 2022-08-19 DIAGNOSIS — I2510 Atherosclerotic heart disease of native coronary artery without angina pectoris: Secondary | ICD-10-CM | POA: Diagnosis not present

## 2022-08-19 NOTE — Progress Notes (Signed)
Subjective:    Patient ID: Heidi Wolf, female    DOB: [birth year], 81 y.o.   MRN: [id number]  HPI 81 yo female with lumbar spinal stenosis returns to discuss reults of MRI and ongoing pain in the low back radiating into the right lower extremity.  Pain occurs mainly first thing in the morning and then later in the evening.  She has been undergoing physical therapy which has helped a little bit. Pt with pain going from back to RIght foot accompanied by tingling.   Has cardiac stents but no longer on plavix just ASA Pain Inventory Average Pain 6 Pain Right Now 6 My pain is tingling and aching  In the last 24 hours, has pain interfered with the following? General activity 6 Relation with others 0 Enjoyment of life 0 What TIME of day is your pain at its worst? morning , evening, and night Sleep (in general) Fair  Pain is worse with: walking, bending, and standing Pain improves with: rest, heat/ice, and medication Relief from Meds: 8  Family History  Problem Relation Age of Onset   Diabetes Mother    Arthritis Mother    CAD Mother 28       CABG   CAD Brother    CAD Sister 76       CABG   Stroke Brother    Stroke Daughter    Diabetes Maternal Grandmother    Stroke Maternal Grandmother    Stroke Father    Heart failure Sister    Kidney failure Sister    Breast cancer Other    Colon cancer Neg Hx    Stomach cancer Neg Hx    Esophageal cancer Neg Hx    Pancreatic cancer Neg Hx    Rectal cancer Neg Hx    Social History   Socioeconomic History   Marital status: Legally Separated    Spouse name: Not on file   Number of children: 3   Years of education: Not on file   Highest education level: Not on file  Occupational History   Occupation: retired  Tobacco Use   Smoking status: Never   Smokeless tobacco: Never  Vaping Use   Vaping Use: Never used  Substance and Sexual Activity   Alcohol use: No   Drug use: No   Sexual activity: Never  Other Topics Concern    Not on file  Social History Narrative   LIves with Tammy   Social Determinants of Health   Financial Resource Strain: Not on file  Food Insecurity: Not on file  Transportation Needs: Not on file  Physical Activity: Not on file  Stress: Not on file  Social Connections: Not on file   Past Surgical History:  Procedure Laterality Date   [place]     "w/gallbladder"   CARDIAC CATHETERIZATION  X 2   CARDIAC CATHETERIZATION N/A 07/03/2016   Procedure: Left Heart Cath and Coronary Angiography;  Surgeon: Dixie Dials, MD;  Location: [place] [facility];  Service: Cardiovascular;  Laterality: N/A;   CARDIAC CATHETERIZATION N/A 07/03/2016   Procedure: Coronary Stent Intervention;  Surgeon: Jettie Booze, MD;  Location: [place] [facility];  Service: Cardiovascular;  Laterality: N/A;   CARDIAC CATHETERIZATION N/A 07/15/2016   Procedure: Left Heart Cath and Coronary Angiography;  Surgeon: Dixie Dials, MD;  Location: [place] [facility];  Service: Cardiovascular;  Laterality: N/A;   CHOLECYSTECTOMY     COLONOSCOPY     CORONARY ANGIOPLASTY  WITH STENT PLACEMENT  06/2016   TUBAL LIGATION  1971   Past Surgical History:  Procedure Laterality Date   ABDOMINAL HYSTERECTOMY     APPENDECTOMY     "w/gallbladder"   CARDIAC CATHETERIZATION  X 2   CARDIAC CATHETERIZATION N/A 07/03/2016   Procedure: Left Heart Cath and Coronary Angiography;  Surgeon: Dixie Dials, MD;  Location: [place] [facility];  Service: Cardiovascular;  Laterality: N/A;   CARDIAC CATHETERIZATION N/A 07/03/2016   Procedure: Coronary Stent Intervention;  Surgeon: Jettie Booze, MD;  Location: [place] [facility];  Service: Cardiovascular;  Laterality: N/A;   CARDIAC CATHETERIZATION N/A 07/15/2016   Procedure: Left Heart Cath and Coronary Angiography;  Surgeon: Dixie Dials, MD;  Location: [place] [facility];  Service: Cardiovascular;  Laterality: N/A;   CHOLECYSTECTOMY     COLONOSCOPY     CORONARY  ANGIOPLASTY WITH STENT PLACEMENT  06/2016   TUBAL LIGATION  1971   Past Medical History:  Diagnosis Date   Allergy    Anemia    Anxiety    Arthritis    "right hip" (09/21/2014)   CAD (coronary artery disease)    PCI 90% diagonal stenosis witha DES July 2017.  Nonobstructive disease elsewhere.    Cataracts, both eyes    Colon polyps    Hyperplastic 2003   External hemorrhoid    GERD (gastroesophageal reflux disease)    Glaucoma    Hyperlipidemia    Hypertension    Internal hemorrhoid    Knee effusion, right 03/05/2020   Myocardial infarction [facility])    Palpitations 09/03/2016   Holter Aug 2017- PACs, no arrhythmia.    Personal history of colonic polyps 03/03/2012   Type II diabetes mellitus (HCC)    type 2   Whiplash injury, acute, sequela 03/05/2020   BP 126/73   Pulse (!) 56   Ht '5\' 4"'$  (1.626 m)   Wt 163 lb (73.9 kg)   SpO2 98%   BMI 27.98 kg/m   Opioid Risk Score:   Fall Risk Score:  `1  Depression screen [facility] 2/9     08/19/2022   11:49 AM 07/08/2022    9:42 AM 06/17/2022   10:08 AM 06/02/2022   10:41 AM 04/21/2022   10:27 AM 01/10/2022   10:08 AM 11/01/2021    3:34 PM  Depression screen PHQ 2/9  Decreased Interest 0 0 1 0 0 0 0  Down, Depressed, Hopeless 0 0 0 0 0 0 0  PHQ - 2 Score 0 0 1 0 0 0 0  Altered sleeping   0 1 0 0   Tired, decreased energy   1 1 0 0   Change in appetite   1 0 0 0   Feeling bad or failure about yourself    0 0 0 0   Trouble concentrating   1 0 0 0   Moving slowly or fidgety/restless   1 0 0 0   Suicidal thoughts   0 0 0 0   PHQ-9 Score   5 2 0 0   Difficult doing work/chores   Not difficult at all          Review of Systems  Musculoskeletal:  Positive for gait problem.       Bilateral leg and foot pain  All other systems reviewed and are negative.     Objective:   Physical Exam Vitals and nursing note reviewed.  Constitutional:      Appearance: She is normal weight.  HENT:     Head: Normocephalic and atraumatic.  Eyes:      Extraocular Movements: Extraocular movements intact.     Conjunctiva/sclera: Conjunctivae normal.     Pupils: Pupils are equal, round, and reactive to light.  Neurological:     Mental Status: She is alert and oriented to person, place, and time.     Comments: Negative straight leg raising bilaterally Sensation normal to pinprick bilateral lower extremities and L3-4-5 S1 dermatome distribution Motor strength is 5/5 bilateral hip flexor knee extensor ankle dorsiflexor. Ambulates without assistive device for short distances uses a cane for longer distances no evidence of toe drag or knee instability  Psychiatric:        Mood and Affect: Mood normal.        Behavior: Behavior normal.           Assessment & Plan:   1.  Lumbar spinal stenosis as evidenced on MRI, MRI films seen and reviewed.  Most likely symptomatic level is L4-5 would do foraminal approach.  Not on any blood thinners other than aspirin, inform patient she will need a driver.

## 2022-08-19 NOTE — Progress Notes (Signed)
Pre procedure instructions reviewed with Heidi Wolf. She will need to have a driver. She is on no blood thinners and can continue her 81 mg ASA. Alerted to call us by day before if she develops fever or cough or is placed on an antibiotic.

## 2022-08-22 DIAGNOSIS — M5090 Cervical disc disorder, unspecified, unspecified cervical region: Secondary | ICD-10-CM | POA: Diagnosis not present

## 2022-08-22 DIAGNOSIS — M19049 Primary osteoarthritis, unspecified hand: Secondary | ICD-10-CM | POA: Diagnosis not present

## 2022-08-22 DIAGNOSIS — M1711 Unilateral primary osteoarthritis, right knee: Secondary | ICD-10-CM | POA: Diagnosis not present

## 2022-08-22 DIAGNOSIS — M1611 Unilateral primary osteoarthritis, right hip: Secondary | ICD-10-CM | POA: Diagnosis not present

## 2022-08-22 DIAGNOSIS — G8191 Hemiplegia, unspecified affecting right dominant side: Secondary | ICD-10-CM | POA: Diagnosis not present

## 2022-08-22 DIAGNOSIS — M5416 Radiculopathy, lumbar region: Secondary | ICD-10-CM | POA: Diagnosis not present

## 2022-08-27 DIAGNOSIS — M19049 Primary osteoarthritis, unspecified hand: Secondary | ICD-10-CM | POA: Diagnosis not present

## 2022-08-27 DIAGNOSIS — G8191 Hemiplegia, unspecified affecting right dominant side: Secondary | ICD-10-CM | POA: Diagnosis not present

## 2022-08-27 DIAGNOSIS — M5090 Cervical disc disorder, unspecified, unspecified cervical region: Secondary | ICD-10-CM | POA: Diagnosis not present

## 2022-08-27 DIAGNOSIS — M1611 Unilateral primary osteoarthritis, right hip: Secondary | ICD-10-CM | POA: Diagnosis not present

## 2022-08-27 DIAGNOSIS — M5416 Radiculopathy, lumbar region: Secondary | ICD-10-CM | POA: Diagnosis not present

## 2022-08-27 DIAGNOSIS — M1711 Unilateral primary osteoarthritis, right knee: Secondary | ICD-10-CM | POA: Diagnosis not present

## 2022-09-02 ENCOUNTER — Other Ambulatory Visit: Payer: Self-pay | Admitting: Family Medicine

## 2022-09-03 DIAGNOSIS — M1711 Unilateral primary osteoarthritis, right knee: Secondary | ICD-10-CM | POA: Diagnosis not present

## 2022-09-03 DIAGNOSIS — M19049 Primary osteoarthritis, unspecified hand: Secondary | ICD-10-CM | POA: Diagnosis not present

## 2022-09-03 DIAGNOSIS — M5090 Cervical disc disorder, unspecified, unspecified cervical region: Secondary | ICD-10-CM | POA: Diagnosis not present

## 2022-09-03 DIAGNOSIS — M1611 Unilateral primary osteoarthritis, right hip: Secondary | ICD-10-CM | POA: Diagnosis not present

## 2022-09-03 DIAGNOSIS — G8191 Hemiplegia, unspecified affecting right dominant side: Secondary | ICD-10-CM | POA: Diagnosis not present

## 2022-09-03 DIAGNOSIS — M5416 Radiculopathy, lumbar region: Secondary | ICD-10-CM | POA: Diagnosis not present

## 2022-09-08 DIAGNOSIS — M19049 Primary osteoarthritis, unspecified hand: Secondary | ICD-10-CM | POA: Diagnosis not present

## 2022-09-08 DIAGNOSIS — M5416 Radiculopathy, lumbar region: Secondary | ICD-10-CM | POA: Diagnosis not present

## 2022-09-08 DIAGNOSIS — G8191 Hemiplegia, unspecified affecting right dominant side: Secondary | ICD-10-CM | POA: Diagnosis not present

## 2022-09-08 DIAGNOSIS — M1611 Unilateral primary osteoarthritis, right hip: Secondary | ICD-10-CM | POA: Diagnosis not present

## 2022-09-08 DIAGNOSIS — M5090 Cervical disc disorder, unspecified, unspecified cervical region: Secondary | ICD-10-CM | POA: Diagnosis not present

## 2022-09-08 DIAGNOSIS — M1711 Unilateral primary osteoarthritis, right knee: Secondary | ICD-10-CM | POA: Diagnosis not present

## 2022-09-19 ENCOUNTER — Encounter: Payer: Self-pay | Admitting: Family Medicine

## 2022-09-19 ENCOUNTER — Ambulatory Visit: Payer: Medicare Other | Admitting: Student

## 2022-09-19 ENCOUNTER — Ambulatory Visit (INDEPENDENT_AMBULATORY_CARE_PROVIDER_SITE_OTHER): Payer: Medicare Other | Admitting: Family Medicine

## 2022-09-19 VITALS — BP 140/70 | HR 62 | Wt 165.2 lb

## 2022-09-19 DIAGNOSIS — I152 Hypertension secondary to endocrine disorders: Secondary | ICD-10-CM

## 2022-09-19 DIAGNOSIS — Z23 Encounter for immunization: Secondary | ICD-10-CM

## 2022-09-19 DIAGNOSIS — M48061 Spinal stenosis, lumbar region without neurogenic claudication: Secondary | ICD-10-CM | POA: Insufficient documentation

## 2022-09-19 DIAGNOSIS — M1711 Unilateral primary osteoarthritis, right knee: Secondary | ICD-10-CM

## 2022-09-19 DIAGNOSIS — E1159 Type 2 diabetes mellitus with other circulatory complications: Secondary | ICD-10-CM

## 2022-09-19 DIAGNOSIS — I2510 Atherosclerotic heart disease of native coronary artery without angina pectoris: Secondary | ICD-10-CM

## 2022-09-19 HISTORY — DX: Spinal stenosis, lumbar region without neurogenic claudication: M48.061

## 2022-09-19 LAB — POCT GLYCOSYLATED HEMOGLOBIN (HGB A1C): HbA1c, POC (prediabetic range): 6.2 % (ref 5.7–6.4)

## 2022-09-19 NOTE — Patient Instructions (Signed)
It was great seeing you today!  I think you are having a flare of your arthritis and I would let Dr. Joan Mayans team know and you would likely benefit from a steroid injection in your knee  Your a1c was normal, great job in keeping up with that and your blood pressure!  We are also giving your flu vaccine today.   Feel free to call with any questions or concerns at any time, at [phone].   Take care,  Dr. Shary Key [facility]

## 2022-09-19 NOTE — Progress Notes (Unsigned)
    SUBJECTIVE:   CHIEF COMPLAINT / HPI:   Patient presents for a1c check but also endorses chronic right sided leg pain.  She states she has pinched nerve on right side, scheduled for MRI. Being followed by Ortho and PM&R. States she has had radiculopathy since June. Also with crhonic knee pain from arthritis. Swelling usually goes down in the morning but has been lasting throughout the day for the past few days.   DM: Last A1c in April 5.9. Asymptomatic. Has been checking her sugars daily and have been in normal range.   HTN: BP 160/72. Hasnt taken metoprool yet. BP log from home with normotensive blood pressures   PERTINENT  PMH / PSH: Reviewed   OBJECTIVE:   BP (!) 140/70   Pulse 62   Wt 165 lb 3.2 oz (74.9 kg)   SpO2 99%   BMI 28.36 kg/m    Physical exam General: well appearing, NAD Cardiovascular: RRR, no murmurs Lungs: CTAB. Normal WOB Abdomen: soft, non-distended, non-tender Skin: warm, dry.  MSK: R Knee: Swelling, no erythema, bruising or gross deformities. Mild TTP along lateral joint line. Full active ROM with flexion and extension though pain with extension. 5/5 strength. NV intact distally. negative anterior and posterior drawer, negative Lachman's, no MCL or LCL laxity   ASSESSMENT/PLAN:   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HCC) A1c today 5.9 today down from 6.1 in April. She does not take medication. Well controlled. Advised her that she can stop checking at home unless she is symptomatic.   Primary osteoarthritis of right knee Patient presents with worsening of her R knee pain likely due to arthritis. Physical exam significant for edematous non erythematous R knee, painful with palpation of lateral joint line and on extension. Currently being worked up by Neurology and Ortho, with an MRI scheduled in the next couple of weeks. She states she is going to call her PM&R doctor to see if they can move up her appointment.   Hypertension associated  with diabetes ([place]) BP elevated at 160/72. Currently on Amlodipine '10mg'$ , Benazepril '20mg'$ , and Metoprolol 12.'5mg'$  BID though she did not take Metoprol today. She has been great about recording home BP measurements which for the most part have been normotensive so will not adjust medications at this time.    Health maintenance Received flu vaccine   [place]

## 2022-09-20 NOTE — Assessment & Plan Note (Signed)
A1c today 5.9 today down from 6.1 in April. She does not take medication. Well controlled. Advised her that she can stop checking at home unless she is symptomatic.

## 2022-09-20 NOTE — Assessment & Plan Note (Signed)
BP elevated at 160/72. Currently on Amlodipine '10mg'$ , Benazepril '20mg'$ , and Metoprolol 12.'5mg'$  BID though she did not take Metoprol today. She has been great about recording home BP measurements which for the most part have been normotensive so will not adjust medications at this time.

## 2022-09-20 NOTE — Assessment & Plan Note (Signed)
Patient presents with worsening of her R knee pain likely due to arthritis. Physical exam significant for edematous non erythematous R knee, painful with palpation of lateral joint line and on extension. Currently being worked up by Neurology and Ortho, with an MRI scheduled in the next couple of weeks. She states she is going to call her PM&R doctor to see if they can move up her appointment.

## 2022-09-29 ENCOUNTER — Other Ambulatory Visit: Payer: Self-pay

## 2022-09-30 MED ORDER — ACCU-CHEK SOFTCLIX LANCETS MISC: 3 refills | Status: DC

## 2022-10-10 ENCOUNTER — Encounter: Payer: Self-pay | Admitting: Physical Medicine & Rehabilitation

## 2022-10-10 ENCOUNTER — Encounter: Payer: Medicare Other | Attending: Physical Medicine & Rehabilitation | Admitting: Physical Medicine & Rehabilitation

## 2022-10-10 VITALS — BP 139/68 | HR 59 | Temp 98.4°F | Ht 64.0 in | Wt 162.0 lb

## 2022-10-10 DIAGNOSIS — M5416 Radiculopathy, lumbar region: Secondary | ICD-10-CM | POA: Diagnosis not present

## 2022-10-10 MED ORDER — DEXAMETHASONE SODIUM PHOSPHATE 10 MG/ML IJ SOLN
10.0000 mg | Freq: Once | INTRAMUSCULAR | Status: AC
  Administered 2022-10-10: 10 mg

## 2022-10-10 MED ORDER — LIDOCAINE HCL (PF) 1 % IJ SOLN
2.0000 mL | Freq: Once | INTRAMUSCULAR | Status: AC
  Administered 2022-10-10: 2 mL

## 2022-10-10 MED ORDER — LIDOCAINE HCL 1 % IJ SOLN
5.0000 mL | Freq: Once | INTRAMUSCULAR | Status: AC
  Administered 2022-10-10: 5 mL

## 2022-10-10 NOTE — Progress Notes (Signed)
  PROCEDURE RECORD [facility]   Name: Heidi Wolf DOB:05-Nov-1941 MRN: [id number]  Date:10/10/2022  Physician: Alysia Penna, MD    Nurse/CMA: Jorja Loa MA  Allergies:  Allergies  Allergen Reactions   Aspirin Other (See Comments)    Makes stomach burn    Consent Signed: Yes.    Is patient diabetic? Yes.    CBG today? 111 today  Pregnant: No. LMP: No LMP recorded. Patient has had a hysterectomy. (age [age])  Anticoagulants: no Anti-inflammatory: no Antibiotics: no  Procedure: Transforaminal Epidural Steriod Injection  Position: Prone Start Time: 11: 22 AM End Time: 11:30 AM  Fluoro Time: 52  RN/CMA Tamu Golz MA Rosielee Corporan MA    Time 11:08 am 11:31 AM    BP 139/68 154/71    Pulse 59 71    Respirations 14 16    O2 Sat 97 96    S/S 6 6    Pain Level 8 7     D/C home with Son, patient A & O X 3, D/C instructions reviewed, and sits independently.

## 2022-10-10 NOTE — Progress Notes (Signed)
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   Informed consent was obtained after describing risk and benefits of the procedure with the patient, this includes bleeding, bruising, infection, paralysis and medication side effects.  The patient wishes to proceed and has given written consent.  Patient was placed in prone position.  The lumbar area was marked and prepped with Betadine.  It was entered with a 25-gauge 1-1/2 inch needle and one mL of 1% lidocaine was injected into the skin and subcutaneous tissue.  Then a 22-gauge 3.5 inch spinal needle was inserted into the right L4-L5 intervertebral foramen under AP, lateral, and oblique view.  Once needle tip was within the foramen on lateral views an dnor exceeding 6 o clock position on the pedicle on AP viewed Isovue 200 was injected x 75m with good outline of nerve root.  Then a solution containing one mL of 10 mg per mL dexamethasone and 2 mL of 1% lidocaine was injected.  The patient tolerated procedure well.  Post procedure instructions were given.  Please see post procedure form.  If patient fails to get good relief with this injection may consider L5-S1 on the right side

## 2022-10-10 NOTE — Patient Instructions (Signed)

## 2022-10-28 DIAGNOSIS — H401131 Primary open-angle glaucoma, bilateral, mild stage: Secondary | ICD-10-CM | POA: Diagnosis not present

## 2022-11-12 ENCOUNTER — Other Ambulatory Visit: Payer: Self-pay | Admitting: Family Medicine

## 2022-11-12 DIAGNOSIS — M5090 Cervical disc disorder, unspecified, unspecified cervical region: Secondary | ICD-10-CM

## 2022-11-13 ENCOUNTER — Other Ambulatory Visit: Payer: Self-pay | Admitting: Student

## 2022-11-13 DIAGNOSIS — M5090 Cervical disc disorder, unspecified, unspecified cervical region: Secondary | ICD-10-CM

## 2022-11-18 ENCOUNTER — Other Ambulatory Visit: Payer: Self-pay | Admitting: Student

## 2022-11-18 DIAGNOSIS — Z1231 Encounter for screening mammogram for malignant neoplasm of breast: Secondary | ICD-10-CM

## 2022-11-27 ENCOUNTER — Encounter: Payer: Medicare Other | Attending: Physical Medicine & Rehabilitation | Admitting: Physical Medicine & Rehabilitation

## 2022-11-27 ENCOUNTER — Encounter: Payer: Self-pay | Admitting: Physical Medicine & Rehabilitation

## 2022-11-27 VITALS — BP 144/71 | HR 58 | Ht 64.0 in | Wt 162.0 lb

## 2022-11-27 DIAGNOSIS — M5416 Radiculopathy, lumbar region: Secondary | ICD-10-CM | POA: Diagnosis not present

## 2022-11-27 DIAGNOSIS — I2510 Atherosclerotic heart disease of native coronary artery without angina pectoris: Secondary | ICD-10-CM
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
## 2022-12-30 ENCOUNTER — Telehealth: Payer: Self-pay | Admitting: Student

## 2022-12-30 ENCOUNTER — Ambulatory Visit (HOSPITAL_COMMUNITY)
Admission: EM | Admit: 2022-12-30 | Discharge: 2022-12-30 | Disposition: A | Discharge status: Home / Self Care | Payer: Medicare Other | Attending: Internal Medicine | Admitting: Internal Medicine

## 2022-12-30 ENCOUNTER — Encounter (HOSPITAL_COMMUNITY): Payer: Self-pay

## 2022-12-30 DIAGNOSIS — J029 Acute pharyngitis, unspecified: Secondary | ICD-10-CM | POA: Diagnosis not present

## 2022-12-30 DIAGNOSIS — Z79899 Other long term (current) drug therapy: Secondary | ICD-10-CM | POA: Diagnosis not present

## 2022-12-30 DIAGNOSIS — R6883 Chills (without fever): Secondary | ICD-10-CM | POA: Diagnosis not present

## 2022-12-30 DIAGNOSIS — U071 COVID-19: Secondary | ICD-10-CM | POA: Diagnosis not present

## 2022-12-30 DIAGNOSIS — J069 Acute upper respiratory infection, unspecified: Secondary | ICD-10-CM

## 2022-12-30 DIAGNOSIS — R059 Cough, unspecified: Secondary | ICD-10-CM | POA: Insufficient documentation

## 2022-12-30 MED ORDER — GUAIFENESIN ER 600 MG PO TB12: 600.0000 mg | ORAL_TABLET | Freq: Two times a day (BID) | ORAL | 0 refills | Status: DC

## 2022-12-30 MED ORDER — BENZONATATE 100 MG PO CAPS: 100.0000 mg | ORAL_CAPSULE | Freq: Three times a day (TID) | ORAL | 0 refills | Status: DC

## 2022-12-30 NOTE — ED Notes (Signed)
Pt arrived stating her daughter has COVID and she is SOB. No distress noted. Pt speaking in complete sentence.

## 2022-12-30 NOTE — Telephone Encounter (Signed)
After hours call:  Pt's daughter has COVID, was admitted to hospital on Saturday and discharged yesterday. She lives with her daughter. Pt started feeling sick on Saturday, feels congested, has productive cough, and sore throat.  Denies fever but has chills. Denies SOB. Has been taking a OTC  Coricidin which she states has helped a little with the cough. Has been vaccinated for COVID twice but not since 2021 per chart review.  She is able to easily speak in full sentences.  Advised patient that it sounds like she is stable currently but with her age and the fact that she has not received a recent COVID booster, I recommend that she goes to urgent care today to be tested for COVID-19.  If she test positive, she is still within the window to receive Paxlovid if appropriate.  Patient agrees with plan and states her son will take her to the urgent care on [address] now.

## 2022-12-30 NOTE — ED Provider Notes (Signed)
[facility]    CSN: [id number] Arrival date & time: 12/30/22  1107      History   Chief Complaint Chief Complaint  Patient presents with   Cough   Covid Exposure    HPI Heidi Wolf is a 82 y.o. female.   Patient presents to urgent care for evaluation of cough, nasal congestion, sore throat, generalized fatigue, and chills that started on Saturday, December 27, 2022.  Daughter is sick with similar symptoms and tested positive for COVID-19.  Patient has been directly exposed to her daughter who has been sick with COVID-19 and believes she has the virus.  She is vaccinated against influenza but has not received her most up-to-date COVID-19 vaccine.  Significant medical history of coronary artery disease, type 2 diabetes, and hypertension.  She has not been dizzy and has been tolerating food and fluids well without difficulty.  Reports decreased appetite without nausea, vomiting, abdominal pain, diarrhea, and blood/mucus to the stools.  Cough is productive with clear phlegm.  Nasal congestion is thick and makes it difficult for her to breathe through her nose.  Cough is worse at nighttime.  She is not experiencing any body aches and has not checked her temperature at home due to lack of working thermometer.  She does not believe that she has had a high fever at home but states she thinks she has had a low fever.  Denies ear pain, tinnitus, headache, vision changes, and lightheadedness.  Denies chest pain, shortness of breath, and heart palpitations.  She has been using Coricidin cough syrup to try to help with symptoms prior to arrival urgent care without much relief.   Cough   Past Medical History:  Diagnosis Date   Allergy    Anemia    Anxiety    Arthritis    "right hip" (09/21/2014)   CAD (coronary artery disease)    PCI 90% diagonal stenosis witha DES July 2017.  Nonobstructive disease elsewhere.    Cataracts, both eyes    Colon polyps    Hyperplastic 2003    External hemorrhoid    GERD (gastroesophageal reflux disease)    Glaucoma    Hyperlipidemia    Hypertension    Internal hemorrhoid    Internal hemorrhoids without mention of complication 06/04/6194   Knee effusion, right 03/05/2020   Myocardial infarction [facility])    Palpitations 09/03/2016   Holter Aug 2017- PACs, no arrhythmia.    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   Thyroid nodule 04/09/2018   GERD (gastroesophageal reflux disease)    Cataracts, both eyes    Primary open angle glaucoma of both eyes, mild stage 12/04/2016   Hypertension associated with diabetes ([place]) 09/03/2016   Hyperlipidemia associated with type 2 diabetes mellitus ([place]) 09/03/2016   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[place]) 09/03/2016   CAD- S/P PCI July 2017 07/02/2016   Cervical neck  pain with evidence of disc disease 09/21/2014    Past Surgical History:  Procedure Laterality Date   ABDOMINAL HYSTERECTOMY     APPENDECTOMY     "w/gallbladder"   CARDIAC CATHETERIZATION  X 2   CARDIAC CATHETERIZATION N/A 07/03/2016   Procedure: Left Heart Cath and Coronary Angiography;  Surgeon: Dixie Dials, MD;  Location: [place] [facility];  Service: Cardiovascular;  Laterality: N/A;   CARDIAC CATHETERIZATION N/A 07/03/2016   Procedure: Coronary Stent Intervention;  Surgeon: Jettie Booze, MD;  Location: [place] [facility];  Service:  Cardiovascular;  Laterality: N/A;   CARDIAC CATHETERIZATION N/A 07/15/2016   Procedure: Left Heart Cath and Coronary Angiography;  Surgeon: Dixie Dials, MD;  Location: [place] [facility];  Service: Cardiovascular;  Laterality: N/A;   CHOLECYSTECTOMY     COLONOSCOPY     CORONARY ANGIOPLASTY WITH STENT PLACEMENT  06/2016   TUBAL LIGATION  1971    OB History   No obstetric history on file.      Home Medications    Prior to Admission medications   Medication Sig Start Date End Date Taking? Authorizing Provider  benzonatate (TESSALON) 100 MG capsule Take 1 capsule (100 mg total) by mouth every 8 (eight) hours. 12/30/22  Yes Talbot Grumbling, FNP  guaiFENesin (MUCINEX) 600 MG 12 hr tablet Take 1 tablet (600 mg total) by mouth 2 (two) times daily. 12/30/22  Yes Temprence Rhines, Stasia Cavalier, FNP  ACCU-CHEK AVIVA PLUS test strip USE TO CHECK BLOOD SUGAR UP TO ONCE DAILY AS NEEDED 09/02/22   Holley Bouche, MD  Accu-Chek Softclix Lancets lancets Please use to check blood sugar once daily. 09/30/22   Holley Bouche, MD  amLODipine (NORVASC) 10 MG tablet Take 1 tablet (10 mg total) by mouth daily. 02/20/22   Minus Breeding, MD  aspirin EC 81 MG tablet Take 1 tablet (81 mg total) by mouth daily. 07/04/16   Dixie Dials, MD  benazepril (LOTENSIN) 20 MG tablet Take 1 tablet (20 mg total) by mouth daily. 02/20/22   Minus Breeding, MD  CHOLECALCIFEROL PO Take 1,000 Units by mouth.    [provider]  diclofenac (VOLTAREN) 75 MG EC tablet Take 1 tablet (75 mg total) by mouth daily. 06/11/22   Simmons-Robinson, Riki Sheer, MD  famotidine (PEPCID) 10 MG tablet Take 1 tablet (10 mg total) by mouth 2 (two) times daily. 01/10/22   Alcus Dad, MD  gabapentin (NEURONTIN) 100 MG capsule TAKE 1 CAPSULE(100 MG) BY MOUTH TWICE DAILY AS NEEDED 11/14/22   Sowell, Erlene Quan, MD  LUMIGAN 0.01 % SOLN Place 1 drop into both eyes at bedtime.  04/13/14   [provider]  metoprolol tartrate (LOPRESSOR) 25 MG tablet  Take 0.5 tablets (12.5 mg total) by mouth 2 (two) times daily. 02/20/22   Minus Breeding, MD  nitroGLYCERIN (NITROSTAT) 0.4 MG SL tablet Place 1 tablet (0.4 mg total) under the tongue every 5 (five) minutes as needed for chest pain. 02/20/22   Minus Breeding, MD  polyethylene glycol powder (GLYCOLAX/MIRALAX) 17 GM/SCOOP powder Take 17 g by mouth daily as needed. 11/07/20   [name], DO  potassium chloride SA (KLOR-CON M) 20 MEQ tablet TAKE 1 TABLET(20 MEQ) BY MOUTH DAILY 04/03/22   Minus Breeding, MD  rosuvastatin (CRESTOR) 40 MG tablet Take 1 tablet by mouth daily. 02/18/21   [provider]  trolamine salicylate (ASPERCREME) 10 % cream Apply 1 application topically as needed for muscle pain.    [provider]    [facility]  History Family History  Problem Relation Age of Onset   Diabetes Mother    Arthritis Mother    CAD Mother 36       CABG   CAD Brother    CAD Sister 38       CABG   Stroke Brother    Stroke Daughter    Diabetes Maternal Grandmother    Stroke Maternal Grandmother    Stroke Father    Heart failure Sister    Kidney failure Sister    Breast cancer Other    Colon cancer Neg Hx    Stomach cancer Neg Hx    Esophageal cancer Neg Hx    Pancreatic cancer Neg Hx    Rectal cancer Neg Hx     Social History Social History   Tobacco Use   Smoking status: Never   Smokeless tobacco: Never  Vaping Use   Vaping Use: Never used  Substance Use Topics   Alcohol use: No   Drug use: No     Allergies   Aspirin   Review of Systems Review of Systems  Respiratory:  Positive for cough.   Per HPI   Physical Exam Triage Vital Signs ED Triage Vitals  Enc Vitals Group     BP 12/30/22 1429 (!) 172/78     Pulse Rate 12/30/22 1122 (!) 56     Resp 12/30/22 1429 18     Temp 12/30/22 1429 98.9 F (37.2 C)     Temp Source 12/30/22 1429 Oral     SpO2 12/30/22 1122 99 %     Weight --      Height --      Head Circumference --      Peak Flow --       Pain Score 12/30/22 1429 0     Pain Loc --      Pain Edu? --      Excl. in [place]? --    No data found.  Updated Vital Signs BP (!) 172/78 (BP Location: Left Arm)   Pulse 60   Temp 98.9 F (37.2 C) (Oral)   Resp 18   SpO2 95%   Visual Acuity Right Eye Distance:   Left Eye Distance:   Bilateral Distance:    Right Eye Near:   Left Eye Near:    Bilateral Near:     Physical Exam Vitals and nursing note reviewed.  Constitutional:      Appearance: She is not ill-appearing or toxic-appearing.  HENT:     Head: Normocephalic and atraumatic.     Right Ear: Hearing, tympanic membrane, ear canal and external ear normal.     Left Ear: Hearing, tympanic membrane, ear canal and external ear normal.     Nose: Nose normal.     Mouth/Throat:     Lips: Pink.     Mouth: Mucous membranes are moist.     Pharynx: Posterior oropharyngeal erythema present.  Eyes:     General: Lids are normal. Vision grossly intact. Gaze aligned appropriately.     Extraocular Movements: Extraocular movements intact.     Conjunctiva/sclera: Conjunctivae normal.  Cardiovascular:     Rate and Rhythm: Normal rate and regular rhythm.     Heart sounds: Normal heart sounds, S1 normal and S2 normal.  Pulmonary:     Effort: Pulmonary effort is normal. No respiratory distress.     Breath sounds: Normal breath sounds and air entry.  Abdominal:     General: Bowel sounds are normal.  Palpations: Abdomen is soft.     Tenderness: There is no abdominal tenderness. There is no right CVA tenderness, left CVA tenderness or guarding.  Musculoskeletal:     Cervical back: Neck supple.  Skin:    General: Skin is warm and dry.     Capillary Refill: Capillary refill takes less than 2 seconds.     Findings: No rash.  Neurological:     General: No focal deficit present.     Mental Status: She is alert and oriented to person, place, and time. Mental status is at baseline.     Cranial Nerves: No dysarthria or facial asymmetry.   Psychiatric:        Mood and Affect: Mood normal.        Speech: Speech normal.        Behavior: Behavior normal.        Thought Content: Thought content normal.        Judgment: Judgment normal.      UC Treatments / Results  Labs (all labs ordered are listed, but only abnormal results are displayed) Labs Reviewed  SARS CORONAVIRUS 2 (TAT 6-24 HRS)    EKG   Radiology No results found.  Procedures Procedures (including critical care time)  Medications Ordered in UC Medications - No data to display  Initial Impression / Assessment and Plan / UC Course  I have reviewed the triage vital signs and the nursing notes.  Pertinent labs & imaging results that were available during my care of the patient were reviewed by me and considered in my medical decision making (see chart for details).   1.  Viral URI with cough Symptoms and physical exam consistent with a viral upper respiratory tract infection that will likely resolve with rest, fluids, and prescriptions for symptomatic relief. No indication for imaging today based on stable cardiopulmonary exam and hemodynamically stable vital signs.  COVID-19 PCR testing is pending.  We will call patient if this is positive.  Quarantine guidelines discussed. Currently on day 4 of symptoms and does qualify for antiviral therapy.  She may have molnupiravir if she tests positive for COVID-19.  Tessalon Perles and Mucinex sent to pharmacy for symptomatic relief to be taken as prescribed.  She may use Tylenol 1000 mg every 6 hours as needed for sore throat, fever/chills, and bodyaches related to viral illness.  Strict ED/urgent care return precautions given.  Patient verbalizes understanding and agreement with plan.  Counseled patient regarding possible side effects and uses of all medications prescribed at today's visit.  Patient verbalizes understanding and agreement with plan.  All questions answered.  Patient discharged from urgent care in  stable condition.        Final Clinical Impressions(s) / UC Diagnoses   Final diagnoses:  Viral URI with cough     Discharge Instructions      You have a viral upper respiratory infection.  COVID-19 testing is pending. We will call you with results if positive. If your COVID test is positive, you must stay at home until day 6 of symptoms. On day 6, you may go out into public and go back to work, but you must wear a mask until day 11 of symptoms to prevent spread to others.  Purchase mucinex (guaifenesin) '600mg'$  and take this every 12 hours for the next few days to thin your nasal congestion and mucous so that you are able to get out of your body easier by coughing and blowing your nose. Drink plenty of  water while taking this.  Take tessalon pearles every 8 hours as needed for cough.  You may take tylenol 1,'000mg'$  every 6 hours as needed for aches, pains, and fever/chills.  You may do salt water and baking soda gargles every 4 hours as needed for your throat pain.  Please put 1 teaspoon of salt and 1/2 teaspoon of baking soda in 8 ounces of warm water then gargle and spit the water out. You may also put 1 tablespoon of honey in warm water and drink this to soothe your throat.  Place a humidifier in your room at night to help decrease dry air that can irritate your airway and cause you to have a sore throat and cough.  Please try to eat a well-balanced diet while you are sick so that your body gets proper nutrition to heal.  If you develop any new or worsening symptoms, please return.  If your symptoms are severe, please go to the emergency room.  Follow-up with your primary care provider for further evaluation and management of your symptoms as well as ongoing wellness visits.  I hope you feel better!      ED Prescriptions     Medication Sig Dispense Auth. Provider   benzonatate (TESSALON) 100 MG capsule Take 1 capsule (100 mg total) by mouth every 8 (eight) hours. 21 capsule  Joella Prince M, FNP   guaiFENesin (MUCINEX) 600 MG 12 hr tablet Take 1 tablet (600 mg total) by mouth 2 (two) times daily. 30 tablet Talbot Grumbling, FNP      PDMP not reviewed this encounter.   Talbot Grumbling, [place] 12/30/22 [id number]

## 2022-12-30 NOTE — Discharge Instructions (Signed)
You have a viral upper respiratory infection.  COVID-19 testing is pending. We will call you with results if positive. If your COVID test is positive, you must stay at home until day 6 of symptoms. On day 6, you may go out into public and go back to work, but you must wear a mask until day 11 of symptoms to prevent spread to others.  Purchase mucinex (guaifenesin) '600mg'$  and take this every 12 hours for the next few days to thin your nasal congestion and mucous so that you are able to get out of your body easier by coughing and blowing your nose. Drink plenty of water while taking this.  Take tessalon pearles every 8 hours as needed for cough.  You may take tylenol 1,'000mg'$  every 6 hours as needed for aches, pains, and fever/chills.  You may do salt water and baking soda gargles every 4 hours as needed for your throat pain.  Please put 1 teaspoon of salt and 1/2 teaspoon of baking soda in 8 ounces of warm water then gargle and spit the water out. You may also put 1 tablespoon of honey in warm water and drink this to soothe your throat.  Place a humidifier in your room at night to help decrease dry air that can irritate your airway and cause you to have a sore throat and cough.  Please try to eat a well-balanced diet while you are sick so that your body gets proper nutrition to heal.  If you develop any new or worsening symptoms, please return.  If your symptoms are severe, please go to the emergency room.  Follow-up with your primary care provider for further evaluation and management of your symptoms as well as ongoing wellness visits.  I hope you feel better!

## 2022-12-30 NOTE — ED Triage Notes (Signed)
Pt c/o cough, congestion, decrease appetite, and covid exposure since Saturday. States SOB at times when coughing hard at night. Took OTC meds with little relief.

## 2022-12-31 LAB — SARS CORONAVIRUS 2 (TAT 6-24 HRS): SARS Coronavirus 2: POSITIVE — AB

## 2023-01-06 ENCOUNTER — Encounter (HOSPITAL_COMMUNITY): Payer: Self-pay

## 2023-01-06 ENCOUNTER — Emergency Department (HOSPITAL_COMMUNITY): Payer: Medicare Other

## 2023-01-06 ENCOUNTER — Emergency Department (HOSPITAL_COMMUNITY)
Admission: EM | Admit: 2023-01-06 | Discharge: 2023-01-06 | Disposition: A | Discharge status: Home / Self Care | Payer: Medicare Other | Attending: Emergency Medicine | Admitting: Emergency Medicine

## 2023-01-06 ENCOUNTER — Other Ambulatory Visit: Payer: Self-pay

## 2023-01-06 DIAGNOSIS — R5383 Other fatigue: Secondary | ICD-10-CM | POA: Diagnosis not present

## 2023-01-06 DIAGNOSIS — U071 COVID-19: Secondary | ICD-10-CM | POA: Diagnosis not present

## 2023-01-06 DIAGNOSIS — J181 Lobar pneumonia, unspecified organism: Secondary | ICD-10-CM | POA: Diagnosis not present

## 2023-01-06 DIAGNOSIS — E876 Hypokalemia: Secondary | ICD-10-CM | POA: Diagnosis not present

## 2023-01-06 DIAGNOSIS — J1282 Pneumonia due to coronavirus disease 2019: Secondary | ICD-10-CM | POA: Diagnosis not present

## 2023-01-06 DIAGNOSIS — Z79899 Other long term (current) drug therapy: Secondary | ICD-10-CM | POA: Diagnosis not present

## 2023-01-06 DIAGNOSIS — E119 Type 2 diabetes mellitus without complications: Secondary | ICD-10-CM | POA: Insufficient documentation

## 2023-01-06 DIAGNOSIS — I10 Essential (primary) hypertension: Secondary | ICD-10-CM | POA: Insufficient documentation

## 2023-01-06 DIAGNOSIS — R079 Chest pain, unspecified: Secondary | ICD-10-CM | POA: Diagnosis not present

## 2023-01-06 DIAGNOSIS — J189 Pneumonia, unspecified organism: Secondary | ICD-10-CM

## 2023-01-06 DIAGNOSIS — R0789 Other chest pain: Secondary | ICD-10-CM | POA: Diagnosis not present

## 2023-01-06 DIAGNOSIS — I499 Cardiac arrhythmia, unspecified: Secondary | ICD-10-CM | POA: Diagnosis not present

## 2023-01-06 DIAGNOSIS — R7989 Other specified abnormal findings of blood chemistry: Secondary | ICD-10-CM | POA: Diagnosis not present

## 2023-01-06 DIAGNOSIS — R0602 Shortness of breath: Secondary | ICD-10-CM | POA: Diagnosis not present

## 2023-01-06 DIAGNOSIS — K449 Diaphragmatic hernia without obstruction or gangrene: Secondary | ICD-10-CM | POA: Diagnosis not present

## 2023-01-06 DIAGNOSIS — R059 Cough, unspecified: Secondary | ICD-10-CM | POA: Diagnosis not present

## 2023-01-06 LAB — COMPREHENSIVE METABOLIC PANEL
ALT: 25 U/L (ref 0–44)
AST: 21 U/L (ref 15–41)
Albumin: 2.9 g/dL — ABNORMAL LOW (ref 3.5–5.0)
Alkaline Phosphatase: 49 U/L (ref 38–126)
Anion gap: 8 (ref 5–15)
BUN: 11 mg/dL (ref 8–23)
CO2: 25 mmol/L (ref 22–32)
Calcium: 8.4 mg/dL — ABNORMAL LOW (ref 8.9–10.3)
Chloride: 105 mmol/L (ref 98–111)
Creatinine, Ser: 0.71 mg/dL (ref 0.44–1.00)
GFR, Estimated: >60 mL/min (ref >60)
Glucose, Bld: 106 mg/dL — ABNORMAL HIGH (ref 70–99)
Potassium: 2.8 mmol/L — ABNORMAL LOW (ref 3.5–5.1)
Sodium: 138 mmol/L (ref 135–145)
Total Bilirubin: 0.5 mg/dL (ref 0.3–1.2)
Total Protein: 6.2 g/dL — ABNORMAL LOW (ref 6.5–8.1)

## 2023-01-06 LAB — CBC WITH DIFFERENTIAL/PLATELET
Abs Immature Granulocytes: 0.03 10*3/uL (ref 0.00–0.07)
Basophils Absolute: 0 10*3/uL (ref 0.0–0.1)
Basophils Relative: 0 %
Eosinophils Absolute: 0.1 10*3/uL (ref 0.0–0.5)
Eosinophils Relative: 1 %
HCT: 39.5 % (ref 36.0–46.0)
Hemoglobin: 12.8 g/dL (ref 12.0–15.0)
Immature Granulocytes: 1 %
Lymphocytes Relative: 33 %
Lymphs Abs: 2.2 10*3/uL (ref 0.7–4.0)
MCH: 25.8 pg — ABNORMAL LOW (ref 26.0–34.0)
MCHC: 32.4 g/dL (ref 30.0–36.0)
MCV: 79.6 fL — ABNORMAL LOW (ref 80.0–100.0)
Monocytes Absolute: 0.6 10*3/uL (ref 0.1–1.0)
Monocytes Relative: 10 %
Neutro Abs: 3.6 10*3/uL (ref 1.7–7.7)
Neutrophils Relative %: 55 %
Platelets: 353 10*3/uL (ref 150–400)
RBC: 4.96 MIL/uL (ref 3.87–5.11)
RDW: 13.9 % (ref 11.5–15.5)
WBC: 6.5 10*3/uL (ref 4.0–10.5)
nRBC: 0 % (ref 0.0–0.2)

## 2023-01-06 LAB — TROPONIN I (HIGH SENSITIVITY)
Troponin I (High Sensitivity): 17 ng/L (ref <18)
Troponin I (High Sensitivity): 12 ng/L (ref <18)

## 2023-01-06 LAB — D-DIMER, QUANTITATIVE: D-Dimer, Quant: 1.22 ug/mL-FEU — ABNORMAL HIGH (ref 0.00–0.50)

## 2023-01-06 LAB — MAGNESIUM: Magnesium: 1.5 mg/dL — ABNORMAL LOW (ref 1.7–2.4)

## 2023-01-06 MED ORDER — POTASSIUM CHLORIDE CRYS ER 20 MEQ PO TBCR: EXTENDED_RELEASE_TABLET | ORAL | 3 refills | Status: DC

## 2023-01-06 MED ORDER — DOXYCYCLINE HYCLATE 100 MG PO TABS
100.0000 mg | ORAL_TABLET | Freq: Once | ORAL | Status: AC
  Administered 2023-01-06: 100 mg via ORAL
  Filled 2023-01-06: qty 1

## 2023-01-06 MED ORDER — GUAIFENESIN ER 600 MG PO TB12: 600.0000 mg | ORAL_TABLET | Freq: Two times a day (BID) | ORAL | 0 refills | Status: DC

## 2023-01-06 MED ORDER — DOXYCYCLINE HYCLATE 100 MG PO CAPS: 100.0000 mg | ORAL_CAPSULE | Freq: Two times a day (BID) | ORAL | 0 refills | Status: AC

## 2023-01-06 MED ORDER — POTASSIUM CHLORIDE 10 MEQ/100ML IV SOLN
10.0000 meq | INTRAVENOUS | Status: AC
  Administered 2023-01-06 (×2): 10 meq via INTRAVENOUS
  Filled 2023-01-06 (×2): qty 100

## 2023-01-06 MED ORDER — SODIUM CHLORIDE (PF) 0.9 % IJ SOLN
INTRAMUSCULAR | Status: AC
  Filled 2023-01-06: qty 50

## 2023-01-06 MED ORDER — MAGNESIUM SULFATE 2 GM/50ML IV SOLN
2.0000 g | Freq: Once | INTRAVENOUS | Status: AC
  Administered 2023-01-06: 2 g via INTRAVENOUS
  Filled 2023-01-06: qty 50

## 2023-01-06 MED ORDER — AMOXICILLIN-POT CLAVULANATE 875-125 MG PO TABS: 1.0000 | ORAL_TABLET | Freq: Two times a day (BID) | ORAL | 0 refills | Status: DC

## 2023-01-06 MED ORDER — BENZONATATE 100 MG PO CAPS: 100.0000 mg | ORAL_CAPSULE | Freq: Three times a day (TID) | ORAL | 0 refills | Status: DC

## 2023-01-06 MED ORDER — IOHEXOL 350 MG/ML SOLN
80.0000 mL | Freq: Once | INTRAVENOUS | Status: AC | PRN
  Administered 2023-01-06: 80 mL via INTRAVENOUS

## 2023-01-06 MED ORDER — POTASSIUM CHLORIDE CRYS ER 20 MEQ PO TBCR
60.0000 meq | EXTENDED_RELEASE_TABLET | Freq: Once | ORAL | Status: AC
  Administered 2023-01-06: 60 meq via ORAL
  Filled 2023-01-06: qty 3

## 2023-01-06 MED ORDER — SODIUM CHLORIDE 0.9 % IV SOLN
2.0000 g | INTRAVENOUS | Status: DC
  Administered 2023-01-06: 2 g via INTRAVENOUS
  Filled 2023-01-06: qty 20

## 2023-01-06 MED ORDER — ACETAMINOPHEN 500 MG PO TABS
1000.0000 mg | ORAL_TABLET | Freq: Once | ORAL | Status: AC
  Administered 2023-01-06: 1000 mg via ORAL
  Filled 2023-01-06: qty 2

## 2023-01-06 MED ORDER — MAGNESIUM OXIDE -MG SUPPLEMENT 400 (240 MG) MG PO TABS: 400.0000 mg | ORAL_TABLET | Freq: Every day | ORAL | 0 refills | Status: DC

## 2023-01-06 MED ORDER — LIDOCAINE 5 % EX PTCH
1.0000 | MEDICATED_PATCH | CUTANEOUS | Status: DC
  Administered 2023-01-06: 1 via TRANSDERMAL
  Filled 2023-01-06: qty 1

## 2023-01-06 NOTE — ED Provider Notes (Signed)
[facility] DEPT Provider Note   CSN: [id number] Arrival date & time: 01/06/23  0025     History  Chief Complaint  Patient presents with   Covid Positive    Heidi Wolf is a 82 y.o. female.  With PMH of HTN, HLD, DM2 who presents with chest pain, cough and shortness of breath on exertion after recently testing positive COVID on December 30, 2022 prior to presentation here.  Patient endorses ongoing symptoms of productive cough, generalized fatigue and mild diarrhea over the past week.  She had tested positive for COVID on January 2 when she went to the urgent care but never took any antiviral medicine.  She was resting at home and making sure she was taking over-the-counter medicine and keeping herself hydrated but the reason she came here today was few hours prior to presentation she had felt some tightness in her chest worse with palpation and deep breathing and after coughing fits and associated shortness of breath which made her come to the ER.  She notes the pain is tight in the substernal region nonradiating.  It did not feel like previous heart attacks.  She has had no leg pain or swelling.  She denies any history of COPD or CHF.  She has had no hemoptysis.  Since resting in the ER she is felt better.  HPI     Home Medications Prior to Admission medications   Medication Sig Start Date End Date Taking? Authorizing Provider  amoxicillin-clavulanate (AUGMENTIN) 875-125 MG tablet Take 1 tablet by mouth every 12 (twelve) hours. 01/06/23  Yes Regan Lemming, MD  doxycycline (VIBRAMYCIN) 100 MG capsule Take 1 capsule (100 mg total) by mouth 2 (two) times daily for 7 days. 01/06/23 01/13/23 Yes Regan Lemming, MD  magnesium oxide (MAG-OX) 400 (240 Mg) MG tablet Take 1 tablet (400 mg total) by mouth daily for 10 days. 01/06/23 01/16/23 Yes Regan Lemming, MD  ACCU-CHEK AVIVA PLUS test strip USE TO CHECK BLOOD SUGAR UP TO ONCE DAILY AS NEEDED 09/02/22   Holley Bouche, MD  Accu-Chek Softclix Lancets lancets Please use to check blood sugar once daily. 09/30/22   Holley Bouche, MD  amLODipine (NORVASC) 10 MG tablet Take 1 tablet (10 mg total) by mouth daily. 02/20/22   Minus Breeding, MD  aspirin EC 81 MG tablet Take 1 tablet (81 mg total) by mouth daily. 07/04/16   Dixie Dials, MD  benazepril (LOTENSIN) 20 MG tablet Take 1 tablet (20 mg total) by mouth daily. 02/20/22   Minus Breeding, MD  benzonatate (TESSALON) 100 MG capsule Take 1 capsule (100 mg total) by mouth every 8 (eight) hours. 01/06/23   Regan Lemming, MD  CHOLECALCIFEROL PO Take 1,000 Units by mouth.    [provider]  diclofenac (VOLTAREN) 75 MG EC tablet Take 1 tablet (75 mg total) by mouth daily. 06/11/22   Simmons-Robinson, Riki Sheer, MD  famotidine (PEPCID) 10 MG tablet Take 1 tablet (10 mg total) by mouth 2 (two) times daily. 01/10/22   Alcus Dad, MD  gabapentin (NEURONTIN) 100 MG capsule TAKE 1 CAPSULE(100 MG) BY MOUTH TWICE DAILY AS NEEDED 11/14/22   Holley Bouche, MD  guaiFENesin (MUCINEX) 600 MG 12 hr tablet Take 1 tablet (600 mg total) by mouth 2 (two) times daily. 01/06/23   Regan Lemming, MD  LUMIGAN 0.01 % SOLN Place 1 drop into both eyes at bedtime.  04/13/14   [provider]  metoprolol tartrate (LOPRESSOR) 25 MG tablet Take 0.5 tablets (12.5  mg total) by mouth 2 (two) times daily. 02/20/22   Minus Breeding, MD  nitroGLYCERIN (NITROSTAT) 0.4 MG SL tablet Place 1 tablet (0.4 mg total) under the tongue every 5 (five) minutes as needed for chest pain. 02/20/22   Minus Breeding, MD  polyethylene glycol powder (GLYCOLAX/MIRALAX) 17 GM/SCOOP powder Take 17 g by mouth daily as needed. 11/07/20   [name], DO  potassium chloride SA (KLOR-CON M) 20 MEQ tablet TAKE 1 TABLET(20 MEQ) BY MOUTH DAILY 01/06/23   Regan Lemming, MD  rosuvastatin (CRESTOR) 40 MG tablet Take 1 tablet by mouth daily. 02/18/21   [provider]  trolamine salicylate (ASPERCREME)  10 % cream Apply 1 application topically as needed for muscle pain.    [provider]      Allergies    Aspirin    Review of Systems   Review of Systems  Physical Exam Updated Vital Signs BP (!) 152/74   Pulse (!) 57   Temp 98.3 F (36.8 C)   Resp 16   SpO2 96%  Physical Exam Constitutional: Alert and oriented.  No acute distress and nontoxic Eyes: Conjunctivae are normal. ENT      Head: Normocephalic and atraumatic.      Nose: No congestion.      Mouth/Throat: Mucous membranes are moist.      Neck: No stridor. Cardiovascular: S1, S2, bradycardic, regular rhythm, equal palpable radial and DP pulses.Warm and well perfused. Respiratory: Normal respiratory effort. Breath sounds are normal.  O2 sat 97 on RA Gastrointestinal: Soft and nontender.  Musculoskeletal: Normal range of motion in all extremities.      Right lower leg: No tenderness or edema.      Left lower leg: No tenderness or edema. Neurologic: Normal speech and language.  Moving extremities x 4.  No facial droop.  Tongue midline.  Sensation grossly intact.  No gross focal neurologic deficits are appreciated. Skin: Skin is warm, dry and intact. No rash noted. Psychiatric: Mood and affect are normal. Speech and behavior are normal.  ED Results / Procedures / Treatments   Labs (all labs ordered are listed, but only abnormal results are displayed) Labs Reviewed  COMPREHENSIVE METABOLIC PANEL - Abnormal; Notable for the following components:      Result Value   Potassium 2.8 (*)    Glucose, Bld 106 (*)    Calcium 8.4 (*)    Total Protein 6.2 (*)    Albumin 2.9 (*)    All other components within normal limits  CBC WITH DIFFERENTIAL/PLATELET - Abnormal; Notable for the following components:   MCV 79.6 (*)    MCH 25.8 (*)    All other components within normal limits  D-DIMER, QUANTITATIVE - Abnormal; Notable for the following components:   D-Dimer, Quant 1.22 (*)    All other components within normal  limits  MAGNESIUM - Abnormal; Notable for the following components:   Magnesium 1.5 (*)    All other components within normal limits  TROPONIN I (HIGH SENSITIVITY)  TROPONIN I (HIGH SENSITIVITY)    EKG EKG Interpretation  Date/Time:  Tuesday January 06 2023 00:41:46 EST Ventricular Rate:  52 PR Interval:  186 QRS Duration: 88 QT Interval:  475 QTC Calculation: 442 R Axis:   53 Text Interpretation: Sinus rhythm Abnormal R-wave progression, early transition Abnormal T, consider ischemia, diffuse leads Similar T wave changes to prior EKG Confirmed by Georgina Snell [phone]) on 01/06/2023 2:47:36 AM  Radiology CT Angio Chest PE W and/or Wo Contrast  Result Date: 01/06/2023 CLINICAL DATA:  Chest pain, shortness of breath, positive D-dimer EXAM: CT ANGIOGRAPHY CHEST WITH CONTRAST TECHNIQUE: Multidetector CT imaging of the chest was performed using the standard protocol during bolus administration of intravenous contrast. Multiplanar CT image reconstructions and MIPs were obtained to evaluate the vascular anatomy. RADIATION DOSE REDUCTION: This exam was performed according to the departmental dose-optimization program which includes automated exposure control, adjustment of the mA and/or kV according to patient size and/or use of iterative reconstruction technique. CONTRAST:  15m OMNIPAQUE IOHEXOL 350 MG/ML SOLN COMPARISON:  Previous studies including the chest radiograph done earlier today FINDINGS: Cardiovascular: There are no intraluminal filling defects in pulmonary artery branches. Contrast density in thoracic aorta is less than adequate to evaluate the lumen. Scattered calcifications are seen in thoracic aorta. Coronary artery calcifications are seen. Mediastinum/Nodes: No significant lymphadenopathy is seen. Lungs/Pleura: There are patchy alveolar infiltrates in right upper lobe, right middle lobe, right lower lobe, left upper lobe and left lower lobe suggesting multifocal pneumonia. Azygous  fissure is seen in right upper lung field. There is no pleural effusion or pneumothorax. Upper Abdomen: Surgical clips are seen in gallbladder fossa. Small hiatal hernia is seen. Musculoskeletal: Degenerative changes are noted in thoracic spine and right shoulder. Review of the MIP images confirms the above findings. IMPRESSION: There is no evidence of pulmonary artery embolism. Aortic atherosclerosis. Coronary artery calcifications are seen. There are patchy alveolar infiltrates of varying sizes scattered throughout both lungs, more so in the left lower lobe. Findings suggest multifocal pneumonia. There is no pleural effusion. Small hiatal hernia. Electronically Signed   By: PElmer PickerM.D.   On: 01/06/2023 07:56   DG Chest Port 1 View  Result Date: 01/06/2023 CLINICAL DATA:  COVID.  Shortness of breath and chest pain EXAM: PORTABLE CHEST 1 VIEW COMPARISON:  10/12/2021 FINDINGS: Low lung volumes accentuate cardiomediastinal silhouette and pulmonary vascularity. Hazy airspace opacity over the left lower lung laterally. No pleural effusion or pneumothorax. Azygous fissure. Aortic atherosclerotic calcification. IMPRESSION: Hazy airspace opacity in the left lower lung laterally suspicious for pneumonia. Electronically Signed   By: TPlacido SouM.D.   On: 01/06/2023 03:13    Procedures Procedures  Remain on constant cardiac monitoring which I personally reviewed, sinus bradycardia.  Medications Ordered in ED Medications  doxycycline (VIBRA-TABS) tablet 100 mg (100 mg Oral Given 01/06/23 0522)  acetaminophen (TYLENOL) tablet 1,000 mg (1,000 mg Oral Given 01/06/23 0521)  potassium chloride SA (KLOR-CON M) CR tablet 60 mEq (60 mEq Oral Given 01/06/23 0559)  potassium chloride 10 mEq in 100 mL IVPB (0 mEq Intravenous Stopped 01/06/23 0819)  iohexol (OMNIPAQUE) 350 MG/ML injection 80 mL (80 mLs Intravenous Contrast Given 01/06/23 0710)  magnesium sulfate IVPB 2 g 50 mL (0 g Intravenous Stopped 01/06/23 1042)     ED Course/ Medical Decision Making/ A&P                           Medical Decision Making AMerial MoritzWDarwishis a 82y.o. female.  With PMH of HTN, HLD, DM2 who presents with chest pain, cough and shortness of breath on exertion after recently testing positive COVID on December 30, 2022 prior to presentation here.   Regarding the patient's chest pain that is pleuritic and reproducible in nature, their HEART score is 4 and overall have an EKG which is reassuring and unchanged from previous. Will further evaluate for ACS which was 17 and generally reassuring but  will follow-up with repeat high sensitive troponin.  Chest x-ray obtained which I personally reviewed which showed evidence of left lower lobe opacity concerning for possible postviral bacterial pneumonia.  There is no evidence of pneumothorax or pulmonary edema.  I suspect patient's pain is most consistent with pleuritic pain from pleurisy from pneumonia and recent viral infection as well as mild costochondritis. I do not think aortic dissection as the patient is well-appearing, does not have ripping/tearing pain and equally has no pulse or neurologic deficits.    Due to patient's sudden onset chest pain shortness of breath although seems pleuritic and likely costochondritis in nature, D-dimer was sent which was positive age-adjusted.  CTA chest ordered to rule out PE.  Further labs notable for hypokalemia 2.8 however normal QTc on EKG.  Creatinine 0.71 within normal limits.  Normal white blood cell count 6.5. CURB 65 scire 1 low risk.  Disposition pending repeat troponin, CTA chest PE protocol and ambulation trial. Signed out to Dr Armandina Gemma.     Amount and/or Complexity of Data Reviewed Labs: ordered. Radiology: ordered.  Risk OTC drugs. Prescription drug management.    Final Clinical Impression(s) / ED Diagnoses Final diagnoses:  Chest pain, unspecified type  Pneumonia of left lower lobe due to infectious organism   Hypokalemia  COVID-19  Hypomagnesemia    Rx / DC Orders ED Discharge Orders          Ordered    guaiFENesin (MUCINEX) 600 MG 12 hr tablet  2 times daily        01/06/23 1038    benzonatate (TESSALON) 100 MG capsule  Every 8 hours        01/06/23 1038    magnesium oxide (MAG-OX) 400 (240 Mg) MG tablet  Daily        01/06/23 1038    potassium chloride SA (KLOR-CON M) 20 MEQ tablet        01/06/23 1038    amoxicillin-clavulanate (AUGMENTIN) 875-125 MG tablet  Every 12 hours        01/06/23 1040    doxycycline (VIBRAMYCIN) 100 MG capsule  2 times daily        01/06/23 1040              Elgie Congo, MD 01/06/23 1606

## 2023-01-06 NOTE — ED Provider Notes (Signed)
  Physical Exam  BP 132/68   Pulse (!) 56   Temp 98.3 F (36.8 C) (Oral)   Resp 14   SpO2 100%     Procedures  Procedures  ED Course / MDM    Medical Decision Making Amount and/or Complexity of Data Reviewed Labs: ordered. Radiology: ordered.  Risk OTC drugs. Prescription drug management.   68F positive for COVID, elevated dimer, pending CTA PE. Has a PNA on CXR. Getting K replenishment. Not on O2. Ambulate, follow-up delta trop, follow-up CTA Chest.  CTA PE: IMPRESSION:  There is no evidence of pulmonary artery embolism. Aortic  atherosclerosis. Coronary artery calcifications are seen.    There are patchy alveolar infiltrates of varying sizes scattered  throughout both lungs, more so in the left lower lobe. Findings  suggest multifocal pneumonia. There is no pleural effusion.    Small hiatal hernia.    Patient was ambulatory in the emergency department without desaturations on pulse oximetry.  Her magnesium is 1.5, currently replenishing IV.  Her K is 2.8, replenishing orally and IV.  She has been covered with broad-spectrum antibiotics.  She does not have a leukocytosis.  She is not febrile.  She is not tachycardic.  She does not meet SIRS criteria at this time.  Her CT angiogram is negative for pulmonary embolism. She is well appearing and tolerating oral intake. Vitally stable.  Will discharge on potassium and magnesium supplementation. Will provide oral ABX for treatment of potential developing PNA in addition to her Covid infection. Re-ordered Tessalon and Mucinex for cough and congestion.  Sitter at admission for observation but feel that the patient is overall stable for discharge with continued management at home.  She has family hours at home who can assist her.         Regan Lemming, MD 01/06/23 1042

## 2023-01-06 NOTE — ED Triage Notes (Signed)
Pt. BIB [facility] from home. Pt. Was diagnosed with Covid Tuesday at urgent care. States that she started feeling better with the meds that she was given, but woke up with exertional SOB.   EMS VS:  HR: 60 O2: 98% RA BP: 142/84 CBG: 123

## 2023-01-06 NOTE — Discharge Instructions (Addendum)
Your symptoms are likely due to a combination of COVID-19 and developing bacterial pneumonia.  We will start you on a course of outpatient antibiotics.  Your magnesium and potassium was also low and these were supplemented in the ED today.  Recommend you continue taking your potassium supplements at home and we will add on magnesium.  Will reorder Tessalon for cough.

## 2023-01-06 NOTE — ED Notes (Signed)
Pt. ambulated around the room O2 was 97 the whole time pt. Stated she felt a little wobbly

## 2023-01-06 NOTE — ED Notes (Signed)
Pt asked that her son be called to come pick her up. Contacted Delfino Lovett [phone] who is aware she is discharged. Pt also requested he go by her house and get her shoes and coat. He verbalized understanding and will come as soon as he can.

## 2023-01-07 ENCOUNTER — Telehealth: Payer: Self-pay

## 2023-01-07 NOTE — Patient Outreach (Signed)
  Care Coordination TOC Note Transition Care Management Follow-up Telephone Call Date of discharge and from where: [facility] 01/06/23 How have you been since you were released from the hospital? "I am feeling somewhat better" Any questions or concerns? No  Items Reviewed: Did the pt receive and understand the discharge instructions provided? Yes  Medications obtained and verified? Yes -Medications reviewed with patient  Other? No  Any new allergies since your discharge? No  Dietary orders reviewed? Yes Do you have support at home? Yes   Home Care and Equipment/Supplies: Were home health services ordered? not applicable If so, what is the name of the agency? N/A  Has the agency set up a time to come to the patient's home? not applicable Were any new equipment or medical supplies ordered?  No What is the name of the medical supply agency? N/a Were you able to get the supplies/equipment? not applicable Do you have any questions related to the use of the equipment or supplies? No  Functional Questionnaire: (I = Independent and D = Dependent) ADLs: I  Bathing/Dressing- I  Meal Prep- I  Eating- I  Maintaining continence- I  Transferring/Ambulation- I  Managing Meds- I  Follow up appointments reviewed:  PCP Hospital f/u appt confirmed? No   Specialist Hospital f/u appt confirmed? No   Are transportation arrangements needed? No  If their condition worsens, is the pt aware to call PCP or go to the Emergency Dept.? Yes Was the patient provided with contact information for the PCP's office or ED? Yes Was to pt encouraged to call back with questions or concerns? Yes  SDOH assessments and interventions completed:   Yes   Care Coordination Interventions:  PCP follow up appointment requested   Encounter Outcome:  Pt. Visit Completed

## 2023-01-09 ENCOUNTER — Telehealth: Payer: Self-pay | Admitting: *Deleted

## 2023-01-09 NOTE — Progress Notes (Signed)
  Care Coordination  Note  01/09/2023 Name: Onalee Steinbach MRN: [id number] DOB: 1941-09-13  De Hollingshead Grange is a 82 y.o. year old primary care patient of Sowell, Erlene Quan, MD. I reached out to [name] by phone today to assist with scheduling a follow up appointment. [name] verbally consented to my assistance.       Follow up plan: Unsuccessful telephone outreach attempt made. A HIPAA compliant phone message was left for the patient providing contact information and requesting a return call.   [place]  Direct Dial: [phone]

## 2023-01-12 NOTE — Progress Notes (Signed)
**Note Heidi-Identified via Obfuscation**   Care Coordination  Note  01/12/2023 Name: Heidi Wolf MRN: [id number] DOB: 09-Dec-1941  Heidi Wolf is a 82 y.o. year old primary care patient of Sowell, Erlene Quan, MD. I reached out to [name] by phone today to assist with scheduling a follow up appointment. [name] verbally consented to my assistance.       Follow up plan: Hospital Follow Up appointment scheduled with (Dr. Joelyn Oms) on (01/14/23) at (11:10 AM).  [place]  Direct Dial: [phone]

## 2023-01-13 ENCOUNTER — Encounter: Payer: Medicare Other | Admitting: Physical Medicine & Rehabilitation

## 2023-01-14 ENCOUNTER — Encounter: Payer: Self-pay | Admitting: Student

## 2023-01-14 ENCOUNTER — Ambulatory Visit (INDEPENDENT_AMBULATORY_CARE_PROVIDER_SITE_OTHER): Payer: Medicare Other | Admitting: Student

## 2023-01-14 ENCOUNTER — Other Ambulatory Visit: Payer: Self-pay

## 2023-01-14 VITALS — BP 154/76 | HR 70 | Wt 160.0 lb

## 2023-01-14 DIAGNOSIS — Z09 Encounter for follow-up examination after completed treatment for conditions other than malignant neoplasm: Secondary | ICD-10-CM | POA: Diagnosis not present

## 2023-01-14 DIAGNOSIS — E876 Hypokalemia: Secondary | ICD-10-CM | POA: Diagnosis not present

## 2023-01-14 DIAGNOSIS — E1159 Type 2 diabetes mellitus with other circulatory complications: Secondary | ICD-10-CM | POA: Diagnosis not present

## 2023-01-14 NOTE — Progress Notes (Signed)
    SUBJECTIVE:   CHIEF COMPLAINT / HPI:   ED F/u Seen in the ED last week d/t weakness, SOB, known to be COVID+ one  week prior. Found to have a likely LLL PNA, sent home with Doxy/Augmentin. She brings in the Augmentin and doxycycline, she has not taken any of these due to not knowing how to dose them with her other medications.  However, despite having not taken her abx, is feeling much better. Feeling like herself. No CP/SOB or lingering cough. Ne recent fevers.  Also found to be hypo-K, hypo-Mag, sent home with repletion. Feeling better now.  She stopped the Mag-Ox due to diarrhea.      OBJECTIVE:   BP (!) 154/76   Pulse 70   Wt 160 lb (72.6 kg)   SpO2 98%   BMI 27.46 kg/m   Physical Exam Vitals reviewed.  Constitutional:      General: She is not in acute distress.    Comments: Elderly female, in good spirits, NAD  Cardiovascular:     Rate and Rhythm: Normal rate and regular rhythm.  Pulmonary:     Effort: Pulmonary effort is normal.     Comments: Coarse throughout but moving air well Abdominal:     General: Abdomen is flat. There is no distension.     Palpations: Abdomen is soft.     Tenderness: There is no abdominal tenderness.  Neurological:     General: No focal deficit present.  Psychiatric:        Mood and Affect: Mood normal.      ASSESSMENT/PLAN:   Hospital discharge follow-up Doing well s/p ER visit for COVID with concern for superimposed PNA. Recovering well without antibiotics. Images reviewed, looks like she had patchy airspace opacities that may simply have represented her COVID and not necessarily a bacterial PNA. No need for the antibiotics now, will send to be destroyed.  - Will re-check BMP and Mag  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HCC) A1c has been quite well-controlled for many months now. Could get away without checking A1c today, but patient prefers to check. Will send A1c with venipuncture sample.      Pearla Dubonnet,  MD [place]

## 2023-01-14 NOTE — Patient Instructions (Addendum)
Heidi Wolf,  I'm so glad you are feeling better. I don't think you need those antibiotics after all. I think all of your symptoms were due to COVID and not a bacterial pneumonia.  I am re-checking your magnesium and potassium levels today. Continue taking the potassium for now, I will call you if we need to make any changes.   Be well,  Pearla Dubonnet, MD

## 2023-01-15 DIAGNOSIS — Z09 Encounter for follow-up examination after completed treatment for conditions other than malignant neoplasm: Secondary | ICD-10-CM | POA: Insufficient documentation

## 2023-01-15 LAB — SPECIMEN STATUS REPORT

## 2023-01-15 NOTE — Assessment & Plan Note (Signed)
Doing well s/p ER visit for COVID with concern for superimposed PNA. Recovering well without antibiotics. Images reviewed, looks like she had patchy airspace opacities that may simply have represented her COVID and not necessarily a bacterial PNA. No need for the antibiotics now, will send to be destroyed.  - Will re-check BMP and Mag

## 2023-01-15 NOTE — Addendum Note (Signed)
Addended by: Jim Like B on: 01/15/2023 09:22 AM   Modules accepted: Orders

## 2023-01-15 NOTE — Assessment & Plan Note (Signed)
A1c has been quite well-controlled for many months now. Could get away without checking A1c today, but patient prefers to check. Will send A1c with venipuncture sample.

## 2023-01-16 ENCOUNTER — Ambulatory Visit: Payer: Medicare Other

## 2023-01-16 ENCOUNTER — Telehealth: Payer: Self-pay

## 2023-01-16 LAB — MAGNESIUM: Magnesium: 2.4 mg/dL — ABNORMAL HIGH (ref 1.6–2.3)

## 2023-01-16 LAB — BASIC METABOLIC PANEL
BUN/Creatinine Ratio: 13 (ref 12–28)
BUN: 13 mg/dL (ref 8–27)
CO2: 25 mmol/L (ref 20–29)
Calcium: 9.9 mg/dL (ref 8.7–10.3)
Chloride: 103 mmol/L (ref 96–106)
Creatinine, Ser: 1.03 mg/dL — ABNORMAL HIGH (ref 0.57–1.00)
Glucose: 106 mg/dL — ABNORMAL HIGH (ref 70–99)
Potassium: 4.2 mmol/L (ref 3.5–5.2)
Sodium: 143 mmol/L (ref 134–144)
eGFR: 55 mL/min/{1.73_m2} — ABNORMAL LOW (ref >59)

## 2023-01-16 LAB — HEMOGLOBIN A1C
Est. average glucose Bld gHb Est-mCnc: 134 mg/dL
Hgb A1c MFr Bld: 6.3 % — ABNORMAL HIGH (ref 4.8–5.6)

## 2023-01-16 MED ORDER — BENZONATATE 100 MG PO CAPS: 100.0000 mg | ORAL_CAPSULE | Freq: Three times a day (TID) | ORAL | 0 refills | Status: DC

## 2023-01-16 NOTE — Telephone Encounter (Signed)
Patient calls nurse line reporting continued cough and congestion post covid infection.   She reports she feels "a lot better" aside from the cough. She reports the Tessalon helped a lot, however she is unsure if she eligible for a refill. If medication can not be refilled she is interested in OTC recommendations.   She reports continued congestion. She reports clear phlegm production after she takes Mucinex. She reports Mucinex has been helpful.   Denies any SOB, body aches, fevers or chills.   Will forward to provider who saw patient for refill request.   Patient advised to make an apt if symptoms worsen.

## 2023-01-16 NOTE — Telephone Encounter (Signed)
Patient has been made aware.

## 2023-01-16 NOTE — Telephone Encounter (Signed)
Patient improving. Good symptomatic response to [employer]-- refill provided.

## 2023-03-11 NOTE — Progress Notes (Signed)
Cardiology Office Note   Date:  03/13/2023   ID:  Heidi Wolf, DOB 08-02-1941, MRN [id number]  PCP:  Holley Bouche, MD  Cardiologist:   Minus Breeding, MD   Chief Complaint  Patient presents with   Coronary Artery Disease      History of Present Illness: Heidi Wolf is a 82 y.o. female who presents for follow up of CAD s/p Dx PCI/ DES 07/03/16. Re look 07/15/16 showed patent stent. She had no other significant CAD and LV wall motion was normal at cath. She complained of intermittent palpitations. A Holter monitor showed PACs and her beta blocker was increased.  Since I last saw her she was in the hospital with chest pain.  She had negative cardiac enzymes and her echo showed no significant wall motion abnormalities.  She did have a suggestion of some hypertrophy of the apical segment.   She had an echo with evidence of apical hypertrophy.  This was unchanged from previous.    Since I last saw her she has had continued hip and leg pain.  She is getting injections.  She gets around with a cane.  She walks to the grocery store walks up couple of stairs in her house. The patient denies any new symptoms such as chest discomfort, neck or arm discomfort. There has been no new shortness of breath, PND or orthopnea. There have been no reported palpitations, presyncope or syncope.    Past Medical History:  Diagnosis Date   Allergy    Anemia    Anxiety    Arthritis    "right hip" (09/21/2014)   CAD (coronary artery disease)    PCI 90% diagonal stenosis witha DES July 2017.  Nonobstructive disease elsewhere.    Cataracts, both eyes    Colon polyps    Hyperplastic 2003   External hemorrhoid    GERD (gastroesophageal reflux disease)    Glaucoma    Hyperlipidemia    Hypertension    Internal hemorrhoid    Internal hemorrhoids without mention of complication [id number]   Knee effusion, right 03/05/2020   Myocardial infarction [facility])    Palpitations 09/03/2016   Holter Aug 2017-  PACs, no arrhythmia.    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   Severe Lumbar spinal stenosis 09/19/2022   Stenosing tenosynovitis of finger of left hand 05/06/2021   Trigger finger, acquired 05/06/2021   Trigger finger, left 03/08/2021   Type II diabetes mellitus ([place])    type 2   Whiplash injury, acute, sequela 03/05/2020    Past Surgical History:  Procedure Laterality Date   ABDOMINAL HYSTERECTOMY     APPENDECTOMY     "w/gallbladder"   CARDIAC CATHETERIZATION  X 2   CARDIAC CATHETERIZATION N/A 07/03/2016   Procedure: Left Heart Cath and Coronary Angiography;  Surgeon: Dixie Dials, MD;  Location: [place] [facility];  Service: Cardiovascular;  Laterality: N/A;   CARDIAC CATHETERIZATION N/A 07/03/2016   Procedure: Coronary Stent Intervention;  Surgeon: Jettie Booze, MD;  Location: [place] [facility];  Service: Cardiovascular;  Laterality: N/A;   CARDIAC CATHETERIZATION N/A 07/15/2016   Procedure: Left Heart Cath and Coronary Angiography;  Surgeon: Dixie Dials, MD;  Location: [place] [facility];  Service: Cardiovascular;  Laterality: N/A;   CHOLECYSTECTOMY     COLONOSCOPY     CORONARY ANGIOPLASTY WITH STENT PLACEMENT  06/2016   TUBAL LIGATION  1971     Current Outpatient Medications  Medication  Sig Dispense Refill   ACCU-CHEK AVIVA PLUS test strip USE TO CHECK BLOOD SUGAR UP TO ONCE DAILY AS NEEDED 100 strip 3   Accu-Chek Softclix Lancets lancets Please use to check blood sugar once daily. 100 each 3   aspirin EC 81 MG tablet Take 1 tablet (81 mg total) by mouth daily.     CHOLECALCIFEROL PO Take 1,000 Units by mouth.     famotidine (PEPCID) 10 MG tablet Take 1 tablet (10 mg total) by mouth 2 (two) times daily. 180 tablet 0   gabapentin (NEURONTIN) 100 MG capsule TAKE 1 CAPSULE(100 MG) BY MOUTH TWICE DAILY AS NEEDED 60 capsule 2   LUMIGAN 0.01 % SOLN Place 1 drop into both eyes at bedtime.      nitroGLYCERIN (NITROSTAT) 0.4 MG SL tablet  Place 1 tablet (0.4 mg total) under the tongue every 5 (five) minutes as needed for chest pain. 25 tablet 3   polyethylene glycol powder (GLYCOLAX/MIRALAX) 17 GM/SCOOP powder Take 17 g by mouth daily as needed. 289 g 1   trolamine salicylate (ASPERCREME) 10 % cream Apply 1 application topically as needed for muscle pain.     amLODipine (NORVASC) 10 MG tablet Take 1 tablet (10 mg total) by mouth daily. 90 tablet 3   benazepril (LOTENSIN) 20 MG tablet Take 1 tablet (20 mg total) by mouth daily. 90 tablet 3   metoprolol tartrate (LOPRESSOR) 25 MG tablet Take 0.5 tablets (12.5 mg total) by mouth 2 (two) times daily. 90 tablet 3   rosuvastatin (CRESTOR) 40 MG tablet Take 1 tablet (40 mg total) by mouth daily. 90 tablet 3   No current facility-administered medications for this visit.    Allergies:   Aspirin   ROS:  Please see the history of present illness.   Otherwise, review of systems are positive none.   All other systems are reviewed and negative.    PHYSICAL EXAM: VS:  BP (!) 112/52   Pulse 97   Ht 5\' 4"  (1.626 m)   Wt 159 lb (72.1 kg)   SpO2 97%   BMI 27.29 kg/m  , BMI Body mass index is 27.29 kg/m.  GENERAL:  Well appearing NECK:  No jugular venous distention, waveform within normal limits, carotid upstroke brisk and symmetric, no bruits, no thyromegaly LUNGS:  Clear to auscultation bilaterally CHEST:  Unremarkable HEART:  PMI not displaced or sustained,S1 and S2 within normal limits, no S3, no S4, no clicks, no rubs, soft apical systolic murmur radiating slightly at the aortic outflow tract, no diastolic murmurs ABD:  Flat, positive bowel sounds normal in frequency in pitch, no bruits, no rebound, no guarding, no midline pulsatile mass, no hepatomegaly, no splenomegaly EXT:  2 plus pulses throughout, no edema, no cyanosis no clubbing  EKG:  EKG is  ordered today. EKG demonstrates sinus bradycardia, rate 57 left ventricular hypertrophy by voltage criteria with repolarization  changes and no change from previous.    Recent Labs: 01/06/2023: ALT 25; Hemoglobin 12.8; Platelets 353 01/14/2023: BUN 13; Creatinine, Ser 1.03; Magnesium 2.4; Potassium 4.2; Sodium 143    Lipid Panel    Component Value Date/Time   CHOL 113 04/21/2022 1101   TRIG 88 04/21/2022 1101   HDL 44 04/21/2022 1101   CHOLHDL 2.6 04/21/2022 1101   CHOLHDL 4.5 02/15/2020 0433   VLDL 28 02/15/2020 0433   LDLCALC 52 04/21/2022 1101     Lab Results  Component Value Date   HGBA1C 6.3 (H) 01/14/2023    Wt Readings  from Last 3 Encounters:  03/13/23 159 lb (72.1 kg)  01/14/23 160 lb (72.6 kg)  11/27/22 162 lb (73.5 kg)      Other studies Reviewed: Additional studies/ records that were reviewed today include: ED records Review of the above records demonstrates: See elsewhere   ASSESSMENT AND PLAN:  CAD:  The patient has no new sypmtoms.  No further cardiovascular testing is indicated.  We will continue with aggressive risk reduction and meds as listed.  ABNORMAL ECHO:   She has some mild apical hypertrophy.  She has no high risk findings.  Her children have been screened.  No further evaluation.  HTN:   The blood pressure is controlled.  No change in therapy.   DM: A1c was 6.3.  She is due to have a follow-up labs in April.  I will defer to their management.   HYPERLIPIDEMIA:     LDL was 52 in April 2023.  Again she is going to have labs as above.   Current medicines are reviewed at length with the patient today.  The patient does not have concerns regarding medicines.  The following changes have been made:   None  Labs/ tests ordered today include:   None  Orders Placed This Encounter  Procedures   EKG 12-Lead     Disposition:   FU with me in 12 months.     Signed, Minus Breeding, MD  03/13/2023 10:50 AM    [place]

## 2023-03-13 ENCOUNTER — Ambulatory Visit: Payer: Medicare Other | Attending: Cardiology | Admitting: Cardiology

## 2023-03-13 ENCOUNTER — Encounter: Payer: Self-pay | Admitting: Cardiology

## 2023-03-13 VITALS — BP 112/52 | HR 97 | Ht 64.0 in | Wt 159.0 lb

## 2023-03-13 DIAGNOSIS — E785 Hyperlipidemia, unspecified: Secondary | ICD-10-CM | POA: Diagnosis not present

## 2023-03-13 DIAGNOSIS — I2510 Atherosclerotic heart disease of native coronary artery without angina pectoris: Secondary | ICD-10-CM | POA: Insufficient documentation

## 2023-03-13 DIAGNOSIS — I10 Essential (primary) hypertension: Secondary | ICD-10-CM

## 2023-03-13 DIAGNOSIS — E118 Type 2 diabetes mellitus with unspecified complications: Secondary | ICD-10-CM | POA: Insufficient documentation

## 2023-03-13 MED ORDER — AMLODIPINE BESYLATE 10 MG PO TABS: 10.0000 mg | ORAL_TABLET | Freq: Every day | ORAL | 3 refills | Status: DC

## 2023-03-13 MED ORDER — BENAZEPRIL HCL 20 MG PO TABS: 20.0000 mg | ORAL_TABLET | Freq: Every day | ORAL | 3 refills | Status: DC

## 2023-03-13 MED ORDER — METOPROLOL TARTRATE 25 MG PO TABS: 12.5000 mg | ORAL_TABLET | Freq: Two times a day (BID) | ORAL | 3 refills | Status: DC

## 2023-03-13 MED ORDER — ROSUVASTATIN CALCIUM 40 MG PO TABS: 40.0000 mg | ORAL_TABLET | Freq: Every day | ORAL | 3 refills | Status: DC

## 2023-03-13 NOTE — Patient Instructions (Signed)
Medication Instructions:  Your physician recommends that you continue on your current medications as directed. Please refer to the Current Medication list given to you today.  *If you need a refill on your cardiac medications before your next appointment, please call your pharmacy*  Follow-Up: At [facility], you and your health needs are our priority.  As part of our continuing mission to provide you with exceptional heart care, we have created designated Provider Care Teams.  These Care Teams include your primary Cardiologist (physician) and Advanced Practice Providers (APPs -  Physician Assistants and Nurse Practitioners) who all work together to provide you with the care you need, when you need it.  We recommend signing up for the patient portal called "MyChart".  Sign up information is provided on this After Visit Summary.  MyChart is used to connect with patients for Virtual Visits (Telemedicine).  Patients are able to view lab/test results, encounter notes, upcoming appointments, etc.  Non-urgent messages can be sent to your provider as well.   To learn more about what you can do with MyChart, go to [email or web address].    Your next appointment:   12 month(s)  Provider:   James Hochrein, MD     

## 2023-03-18 DIAGNOSIS — Z23 Encounter for immunization: Secondary | ICD-10-CM | POA: Diagnosis not present

## 2023-04-01 ENCOUNTER — Encounter: Payer: Self-pay | Admitting: Student

## 2023-04-01 ENCOUNTER — Ambulatory Visit (INDEPENDENT_AMBULATORY_CARE_PROVIDER_SITE_OTHER): Payer: Medicare Other | Admitting: Student

## 2023-04-01 VITALS — BP 136/66 | Ht 64.0 in | Wt 158.0 lb

## 2023-04-01 DIAGNOSIS — E785 Hyperlipidemia, unspecified: Secondary | ICD-10-CM | POA: Diagnosis not present

## 2023-04-01 DIAGNOSIS — M5090 Cervical disc disorder, unspecified, unspecified cervical region: Secondary | ICD-10-CM

## 2023-04-01 DIAGNOSIS — I2510 Atherosclerotic heart disease of native coronary artery without angina pectoris: Secondary | ICD-10-CM

## 2023-04-01 DIAGNOSIS — I152 Hypertension secondary to endocrine disorders: Secondary | ICD-10-CM | POA: Diagnosis not present

## 2023-04-01 DIAGNOSIS — E1159 Type 2 diabetes mellitus with other circulatory complications: Secondary | ICD-10-CM | POA: Diagnosis not present

## 2023-04-01 DIAGNOSIS — E1169 Type 2 diabetes mellitus with other specified complication: Secondary | ICD-10-CM

## 2023-04-01 DIAGNOSIS — Z9861 Coronary angioplasty status: Secondary | ICD-10-CM | POA: Diagnosis not present

## 2023-04-01 LAB — POCT GLYCOSYLATED HEMOGLOBIN (HGB A1C): HbA1c, POC (controlled diabetic range): 5.8 % (ref 0.0–7.0)

## 2023-04-01 MED ORDER — GABAPENTIN 100 MG PO CAPS
ORAL_CAPSULE | ORAL | 2 refills | Status: DC
Start: 2023-04-01 — End: 2023-07-08

## 2023-04-01 NOTE — Patient Instructions (Signed)
It was great to see you! Thank you for allowing me to participate in your care!  I recommend that you always bring your medications to each appointment as this makes it easy to ensure we are on the correct medications and helps [place] not miss when refills are needed.  Our plans for today:  - HTN  Doing well, blood pressure is well controlled. Continue your home medications   Medications: benezapril 20 mg, Metop tart 12.5 mg , amlodipine 10 mg - DM2  Checking your A1c today.  - HLD  Checking your cholesterol today  We are checking some labs today, I will call you if they are abnormal will send you a MyChart message or a letter if they are normal.  If you do not hear about your labs in the next 2 weeks please let us know.  Take care and seek immediate care sooner if you develop any concerns.   Dr. Holley Bouche, MD [place]

## 2023-04-01 NOTE — Progress Notes (Signed)
SUBJECTIVE:   CHIEF COMPLAINT / HPI:    HTN BP Readings from Last 3 Encounters:  04/01/23 136/66  03/13/23 (!) 112/52  01/14/23 (!) 154/76  BP at goal today:  136/66 and well controlled at home Medications: benezapril 20 mg, Metop tart 12.5 mg , amlodipine 10 mg   HLD Lab Results  Component Value Date   CHOL 113 04/21/2022   HDL 44 04/21/2022   LDLCALC 52 04/21/2022   TRIG 88 04/21/2022   CHOLHDL 2.6 04/21/2022  Medications: Crestor 40 mg  DM2 Lab Results  Component Value Date   HGBA1C 6.3 (H) 01/14/2023  Meds: None CBG checks at home ranging to 90-110 range  CAD Everything looking good, f/u in 1 year. Is on ASA 81 mg.  Arthritis/nerve pain Requesting refill of gabapentin, helps w/ nerve pain. Uses the gabapentin intermittently, and it helps some. She thought she could only take it 1 x a day. Sees a physical medicine doctor for arthritis pain. Due to get a shot on 4/11.   PERTINENT  PMH / PSH: HTN, HLD, CAD, DM2    Patient Care Team: Holley Bouche, MD as PCP - General (Family Medicine) Minus Breeding, MD as PCP - Cardiology (Cardiology) OBJECTIVE:  BP 136/66   Ht 5\' 4"  (1.626 m)   Wt 158 lb (71.7 kg)   BMI 27.12 kg/m  Physical Exam Constitutional:      General: She is not in acute distress.    Appearance: Normal appearance.  Cardiovascular:     Rate and Rhythm: Normal rate and regular rhythm.     Pulses: Normal pulses.     Heart sounds: Normal heart sounds. No murmur heard.    No friction rub. No gallop.  Pulmonary:     Effort: Pulmonary effort is normal. No respiratory distress.     Breath sounds: Normal breath sounds. No stridor. No wheezing, rhonchi or rales.  Abdominal:     General: There is no distension.     Palpations: Abdomen is soft. There is no mass.     Tenderness: There is no abdominal tenderness.  Neurological:     Mental Status: She is alert.      ASSESSMENT/PLAN:  Type 2 diabetes mellitus with other circulatory complication,  without long-term current use of insulin Assessment & Plan: Not on any DM medications for hx of low A1c. Will test A1c today. Reports CBG range 110-90 at home. 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 BP at goal today 136/66 -Continue benezapril 20 mg, Metop tart 12.5 mg , amlodipine 10 mg -BMP & Mg   Hyperlipidemia associated with type 2 diabetes mellitus Assessment & Plan: Report's compliance with statin. Checking lipid panel today. -Lipid panel   CAD- S/P PCI July 2017 Assessment & Plan: Patient saw cardiology recently and was told that everything looks good. Plans to f/u in 1 year. -F/u w/ cardiology in 1 year -Continue ASA 81 mg    No follow-ups on file. Holley Bouche, MD  04/01/2023, 10:37 AM PGY-2, [place]

## 2023-04-01 NOTE — Assessment & Plan Note (Signed)
Not on any DM medications for hx of low A1c. Will test A1c today. Reports CBG range 110-90 at home. -Hgb A1c

## 2023-04-01 NOTE — Assessment & Plan Note (Signed)
Report's compliance with statin. Checking lipid panel today. -Lipid panel

## 2023-04-01 NOTE — Assessment & Plan Note (Addendum)
Patient saw cardiology recently and was told that everything looks good. Plans to f/u in 1 year. -F/u w/ cardiology in 1 year -Continue ASA 81 mg

## 2023-04-01 NOTE — Assessment & Plan Note (Signed)
Patient compliant w/ medications, checking BP at home, and appear well controlled. BP at goal today 136/66 -Continue benezapril 20 mg, Metop tart 12.5 mg , amlodipine 10 mg -BMP & Mg

## 2023-04-02 DIAGNOSIS — H401131 Primary open-angle glaucoma, bilateral, mild stage: Secondary | ICD-10-CM | POA: Diagnosis not present

## 2023-04-02 LAB — BASIC METABOLIC PANEL
BUN/Creatinine Ratio: 19 (ref 12–28)
BUN: 20 mg/dL (ref 8–27)
CO2: 23 mmol/L (ref 20–29)
Calcium: 9.8 mg/dL (ref 8.7–10.3)
Chloride: 104 mmol/L (ref 96–106)
Creatinine, Ser: 1.03 mg/dL — ABNORMAL HIGH (ref 0.57–1.00)
Glucose: 98 mg/dL (ref 70–99)
Potassium: 4.2 mmol/L (ref 3.5–5.2)
Sodium: 143 mmol/L (ref 134–144)
eGFR: 55 mL/min/{1.73_m2} — ABNORMAL LOW (ref >59)

## 2023-04-02 LAB — LIPID PANEL
Chol/HDL Ratio: 3.6 ratio (ref 0.0–4.4)
Cholesterol, Total: 186 mg/dL (ref 100–199)
HDL: 51 mg/dL (ref >39)
LDL Chol Calc (NIH): 114 mg/dL — ABNORMAL HIGH (ref 0–99)
Triglycerides: 120 mg/dL (ref 0–149)
VLDL Cholesterol Cal: 21 mg/dL (ref 5–40)

## 2023-04-02 LAB — MAGNESIUM: Magnesium: 2.1 mg/dL (ref 1.6–2.3)

## 2023-04-09 ENCOUNTER — Encounter: Payer: Self-pay | Admitting: Physical Medicine & Rehabilitation

## 2023-04-09 ENCOUNTER — Encounter: Payer: Medicare Other | Attending: Physical Medicine & Rehabilitation | Admitting: Physical Medicine & Rehabilitation

## 2023-04-09 DIAGNOSIS — M5416 Radiculopathy, lumbar region: Secondary | ICD-10-CM | POA: Diagnosis not present

## 2023-04-09 MED ORDER — LIDOCAINE HCL (PF) 1 % IJ SOLN
2.0000 mL | Freq: Once | INTRAMUSCULAR | Status: AC
Start: 2023-04-09 — End: 2023-04-09
  Administered 2023-04-09: 2 mL

## 2023-04-09 MED ORDER — IOHEXOL 180 MG/ML  SOLN
3.0000 mL | Freq: Once | INTRAMUSCULAR | Status: AC
Start: 2023-04-09 — End: 2023-04-09
  Administered 2023-04-09: 3 mL

## 2023-04-09 MED ORDER — DEXAMETHASONE SODIUM PHOSPHATE 10 MG/ML IJ SOLN
10.0000 mg | Freq: Once | INTRAMUSCULAR | Status: AC
Start: 2023-04-09 — End: 2023-04-09
  Administered 2023-04-09: 10 mg

## 2023-04-09 MED ORDER — LIDOCAINE HCL 1 % IJ SOLN
5.0000 mL | Freq: Once | INTRAMUSCULAR | Status: AC
Start: 2023-04-09 — End: 2023-04-09
  Administered 2023-04-09: 5 mL

## 2023-04-09 NOTE — Progress Notes (Signed)
  PROCEDURE RECORD [facility]   Name: Ife Crapser DOB:02/07/1941 MRN: [id number]  Date:04/09/2023  Physician: Claudette Laws, MD    Nurse/CMA: Joelene Barriere S  Allergies:  Allergies  Allergen Reactions   Aspirin Other (See Comments)    Makes stomach burn    Consent Signed: Yes.    Is patient diabetic? Yes.    CBG today? 113  Pregnant: No. LMP: No LMP recorded. Patient has had a hysterectomy. (age [age])  Anticoagulants: no Anti-inflammatory: no Antibiotics: no  Procedure: Right L5-S1Transforaminal   Position: Prone Start Time: 10:40  End Time: 10:44  Fluoro Time: 21  RN/CMA Taleyah Hillman S Binnie Droessler S    Time 10:17 10:45    BP 151/75 152/68    Pulse 53 64    Respirations 16 16    O2 Sat 98 96    S/S 6 6    Pain Level 7/10 5/10     D/C home with son, patient A & O X 3, D/C instructions reviewed, and sits independently.

## 2023-04-09 NOTE — Progress Notes (Signed)
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   Informed consent was obtained after describing risk and benefits of the procedure with the patient, this includes bleeding, bruising, infection, paralysis and medication side effects.  The patient wishes to proceed and has given written consent.  Patient was placed in prone position.  The lumbar area was marked and prepped with Betadine.  It was entered with a 25-gauge 1-1/2 inch needle and one mL of 1% lidocaine was injected into the skin and subcutaneous tissue.  Then a 22-gauge 3.5 inch spinal needle was inserted into the right L5-S1 intervertebral foramen under AP, lateral, and oblique view.  Once needle tip was within the foramen on lateral views an dnor exceeding 6 o clock position on the pedicle on AP viewed Isovue 200 was injected x 65ml with good outline of nerve root.  Then a solution containing one mL of 10 mg per mL dexamethasone and 2 mL of 1% lidocaine was injected.  The patient tolerated procedure well.  Post procedure instructions were given.  Please see post procedure form.

## 2023-04-09 NOTE — Patient Instructions (Signed)

## 2023-04-15 ENCOUNTER — Other Ambulatory Visit: Payer: Self-pay | Admitting: Cardiology

## 2023-04-23 ENCOUNTER — Ambulatory Visit
Admission: RE | Admit: 2023-04-23 | Discharge: 2023-04-23 | Disposition: A | Discharge status: Home / Self Care | Payer: Medicare Other | Source: Ambulatory Visit | Attending: Family Medicine | Admitting: Family Medicine

## 2023-04-23 DIAGNOSIS — Z1231 Encounter for screening mammogram for malignant neoplasm of breast: Secondary | ICD-10-CM | POA: Diagnosis not present

## 2023-05-21 ENCOUNTER — Encounter: Payer: Medicare Other | Attending: Physical Medicine & Rehabilitation | Admitting: Physical Medicine & Rehabilitation

## 2023-05-21 ENCOUNTER — Encounter: Payer: Self-pay | Admitting: Physical Medicine & Rehabilitation

## 2023-05-21 VITALS — BP 136/69 | HR 56 | Ht 64.0 in | Wt 162.0 lb

## 2023-05-21 DIAGNOSIS — M48062 Spinal stenosis, lumbar region with neurogenic claudication: Secondary | ICD-10-CM | POA: Diagnosis not present

## 2023-05-21 NOTE — Progress Notes (Signed)
Subjective:    Patient ID: Heidi Wolf, female    DOB: Jul 30, 1941, 82 y.o.   MRN: [id number] 82 year old female with lumbar spinal stenosis who has primary complaints of low back pain going into the right greater than left lower extremity.  She had a recent MRI of the lumbar spine performed in July 2023 which was once again reviewed.  She has severe stenosis at L4-L5 level.  The right L4-5 ESI was a selective nerve root block since foraminal access could not be again due to severe stenosis. The patient only had very temporary relief. HPI  04/09/23   Right L5-S1 lumbar selective nerve root block under fluoroscopic guidance with contrast enhancement  First few days had improved pain running down the Right leg     PCP prescribing Gabapentin   Moderate OA right knee , has seen   Pain Inventory Average Pain 9 Pain Right Now 7 My pain is constant sharp stabbing tingling aching  In the last 24 hours, has pain interfered with the following? General activity 4 Relation with others 0 Enjoyment of life 7 What TIME of day is your pain at its worst? varies Sleep (in general) Fair  Pain is worse with: walking, bending, standing, and some activites Pain improves with: rest, heat/ice, medication, and injections Relief from Meds: 7  Family History  Problem Relation Age of Onset   Diabetes Mother    Arthritis Mother    CAD Mother 52       CABG   CAD Brother    CAD Sister 58       CABG   Stroke Brother    Stroke Daughter    Diabetes Maternal Grandmother    Stroke Maternal Grandmother    Stroke Father    Heart failure Sister    Kidney failure Sister    Breast cancer Other    Colon cancer Neg Hx    Stomach cancer Neg Hx    Esophageal cancer Neg Hx    Pancreatic cancer Neg Hx    Rectal cancer Neg Hx    Social History   Socioeconomic History   Marital status: Legally Separated    Spouse name: Not on file   Number of children: 3   Years of education: Not on file    Highest education level: Not on file  Occupational History   Occupation: retired  Tobacco Use   Smoking status: Never   Smokeless tobacco: Never  Vaping Use   Vaping Use: Never used  Substance and Sexual Activity   Alcohol use: No   Drug use: No   Sexual activity: Never  Other Topics Concern   Not on file  Social History Narrative   LIves with Tammy   Social Determinants of Health   Financial Resource Strain: Not on file  Food Insecurity: Not on file  Transportation Needs: Not on file  Physical Activity: Not on file  Stress: Not on file  Social Connections: Not on file   Past Surgical History:  Procedure Laterality Date   ABDOMINAL HYSTERECTOMY     APPENDECTOMY     "w/gallbladder"   CARDIAC CATHETERIZATION  X 2   CARDIAC CATHETERIZATION N/A 07/03/2016   Procedure: Left Heart Cath and Coronary Angiography;  Surgeon: Orpah Cobb, MD;  Location: [facility];  Service: Cardiovascular;  Laterality: N/A;   CARDIAC CATHETERIZATION N/A 07/03/2016   Procedure: Coronary Stent Intervention;  Surgeon: Corky Crafts, MD;  [facility]: [facility] IN[facility];  Service: Cardiovascular;  Laterality: N/A;   CARDIAC CATHETERIZATION N/A 07/15/2016   Procedure: Left Heart Cath and Coronary Angiography;  Surgeon: Orpah Cobb, MD;  Location: [facility];  Service: Cardiovascular;  Laterality: N/A;   CHOLECYSTECTOMY     COLONOSCOPY     CORONARY ANGIOPLASTY WITH STENT PLACEMENT  06/2016   TUBAL LIGATION  1971   Past Surgical History:  Procedure Laterality Date   ABDOMINAL HYSTERECTOMY     APPENDECTOMY     "w/gallbladder"   CARDIAC CATHETERIZATION  X 2   CARDIAC CATHETERIZATION N/A 07/03/2016   Procedure: Left Heart Cath and Coronary Angiography;  Surgeon: Orpah Cobb, MD;  Location: [facility];  Service: Cardiovascular;  Laterality: N/A;   CARDIAC CATHETERIZATION N/A 07/03/2016   Procedure: Coronary Stent Intervention;  Surgeon: Corky Crafts, MD;  Location: [facility]  INVASIVE CV LAB;  Service: Cardiovascular;  Laterality: N/A;   CARDIAC CATHETERIZATION N/A 07/15/2016   Procedure: Left Heart Cath and Coronary Angiography;  Surgeon: Orpah Cobb, MD;  Location: [facility];  Service: Cardiovascular;  Laterality: N/A;   CHOLECYSTECTOMY     COLONOSCOPY     CORONARY ANGIOPLASTY WITH STENT PLACEMENT  06/2016   TUBAL LIGATION  1971   Past Medical History:  Diagnosis Date   Allergy    Anemia    Anxiety    Arthritis    "right hip" (09/21/2014)   CAD (coronary artery disease)    PCI 90% diagonal stenosis witha DES July 2017.  Nonobstructive disease elsewhere.    Cataracts, both eyes    Colon polyps    Hyperplastic 2003   External hemorrhoid    GERD (gastroesophageal reflux disease)    Glaucoma    Hyperlipidemia    Hypertension    Internal hemorrhoid    Internal hemorrhoids without mention of complication 03/03/2012   Knee effusion, right 03/05/2020   Myocardial infarction [facility])    Palpitations 09/03/2016   Holter Aug 2017- PACs, no arrhythmia.    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   Severe Lumbar spinal stenosis 09/19/2022   Stenosing tenosynovitis of finger of left hand 05/06/2021   Trigger finger, acquired 05/06/2021   Trigger finger, left 03/08/2021   Type II diabetes mellitus ([facility])    type 2   Whiplash injury, acute, sequela 03/05/2020   BP (!) 149/74   Pulse (!) 56   Ht 5\' 4"  (1.626 m)   Wt 162 lb (73.5 kg)   SpO2 95%   BMI 27.81 kg/m   Opioid Risk Score:   Fall Risk Score:  `1  Depression screen [facility] 2/9     05/21/2023    9:51 AM 04/01/2023    9:55 AM 01/14/2023   10:59 AM 11/27/2022    9:58 AM 10/10/2022   11:04 AM 08/19/2022   11:49 AM 07/08/2022    9:42 AM  Depression screen PHQ 2/9  Decreased Interest 0 0 0 0 0 0 0  Down, Depressed, Hopeless 0 0 0 0 0 0 0  PHQ - 2 Score 0 0 0 0 0 0 0  Altered sleeping  0 0      Tired, decreased energy  0 1      Change in appetite  0 1      Feeling  bad or failure about yourself   0 0      Trouble concentrating  0 0      Moving slowly or fidgety/restless  0 0  Suicidal thoughts  0 0      PHQ-9 Score  0 2      Difficult doing work/chores  Not difficult at all Somewhat difficult         Review of Systems  Musculoskeletal:  Positive for back pain.       B/L hip leg foot pain  All other systems reviewed and are negative.      Objective:   Physical Exam Vitals and nursing note reviewed.  Constitutional:      Appearance: She is normal weight.  Eyes:     Extraocular Movements: Extraocular movements intact.     Conjunctiva/sclera: Conjunctivae normal.     Pupils: Pupils are equal, round, and reactive to light.  Musculoskeletal:     Right lower leg: No edema.     Left lower leg: No edema.  Skin:    General: Skin is warm and dry.  Neurological:     Mental Status: She is alert and oriented to person, place, and time.  Psychiatric:        Mood and Affect: Mood normal.        Behavior: Behavior normal.   Motor strength is 5/5 bilateral hip flexor knee extensor ankle dorsiflexor Negative straight leg raise Sensation equal bilateral lower extremities to light touch Right knee with valgus deformity mild No evidence of knee effusion full range of motion bilateral knees Ambulates without assistive device for a few steps otherwise uses a cane for longer distances.  No evidence of toe drag or knee instability Lumbar range of motion is 50% flexion extension lateral bending and rotation.  Lateral bending to the left causes pain on the left side of the back.         Assessment & Plan:   Lumbar spinal stenosis primarily at L4-5 has had only very short term response to both L4-5 and L5-S1 transforaminal ESI  Had some home therapy in 2023, cont HEP  Try increasing gabapentin to 100mg  TID 3.  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

## 2023-05-21 NOTE — Patient Instructions (Signed)
Please try taking gabapentin 100mg   3 times daily   Please call if you would like a referral to orthopedic spine surgeon

## 2023-07-08 ENCOUNTER — Ambulatory Visit (INDEPENDENT_AMBULATORY_CARE_PROVIDER_SITE_OTHER): Payer: Medicare Other | Admitting: Student

## 2023-07-08 ENCOUNTER — Encounter: Payer: Self-pay | Admitting: Student

## 2023-07-08 VITALS — BP 138/72 | HR 54 | Ht 64.0 in | Wt 162.2 lb

## 2023-07-08 DIAGNOSIS — Z7189 Other specified counseling: Secondary | ICD-10-CM | POA: Diagnosis not present

## 2023-07-08 DIAGNOSIS — I152 Hypertension secondary to endocrine disorders: Secondary | ICD-10-CM | POA: Diagnosis not present

## 2023-07-08 DIAGNOSIS — M5090 Cervical disc disorder, unspecified, unspecified cervical region: Secondary | ICD-10-CM | POA: Diagnosis not present

## 2023-07-08 DIAGNOSIS — E1159 Type 2 diabetes mellitus with other circulatory complications: Secondary | ICD-10-CM | POA: Diagnosis not present

## 2023-07-08 LAB — POCT GLYCOSYLATED HEMOGLOBIN (HGB A1C): HbA1c, POC (controlled diabetic range): 6.1 % (ref 0.0–7.0)

## 2023-07-08 MED ORDER — GABAPENTIN 100 MG PO CAPS
ORAL_CAPSULE | ORAL | 2 refills | Status: DC
Start: 2023-07-08 — End: 2023-07-08

## 2023-07-08 MED ORDER — GABAPENTIN 100 MG PO CAPS
ORAL_CAPSULE | ORAL | 0 refills | Status: DC
Start: 2023-07-08 — End: 2024-03-21

## 2023-07-08 NOTE — Patient Instructions (Signed)
It was great to see you! Thank you for allowing me to participate in your care!    Our plans for today:  - Diabetes Your diabetes is well controlled! Because you are no longer on medication, you do not need to check your sugar any longer. We will keep an eye on your diabetes, yearly  We'll have you follow up in 6 months  - Hip/Leg pain/Surgery We discussed your function, pain, and possibility of having surgery. Because you are able to get around okay, and we are not sure about the recovery process, or how much benefit you will get from your surgery, we decided against the surgery. If you have any more questions or would like to discuss more, just make an appointment.   * we would hate for you to have the surgery and need months of recovery, as you don't have a lot of support at home. We also don't know the degree of improvement in pain, it could be a 90% improvement or it could be 60%, possibly even less. We also don't know what to expect if things go wrong with the surgery  Kidney function  Checking your kidney function, as it was a little decreased at your last visit.   Take care and seek immediate care sooner if you develop any concerns.   Dr. [name], MD [facility]

## 2023-07-08 NOTE — Progress Notes (Signed)
SUBJECTIVE:   CHIEF COMPLAINT / HPI:   Hip/Back pain Notes pain down her back/down her leg, from spinal stenosis. Is following with PMR. She is considering surgery and wants help with making the decision.  She reports that her daughter, had a stroke, and is limited in her ability to assist at home.  Also reports her 2 sons have musculoskeletal issues, limiting their ability to help at home.  Patient does not feel like she has good support for after surgical care.  Patient also unsure efficacy/guarantee of improvement with surgery.  Patient concerned if she were to have adverse outcome, or prolonged recovery, this could be substantial issue for her life.  Patient feels she gets around well enough as is, and notes pain is mild rated a 4-7 out of 10. Patient also noting that she want's to stay out of a facility as long as possible.   PERTINENT  PMH / PSH:     Patient Care Team: Billey Co, MD as PCP - General (Family Medicine) Rollene Rotunda, MD as PCP - Cardiology (Cardiology) OBJECTIVE:  BP 138/72   Pulse (!) 54   Ht 5\' 4"  (1.626 m)   Wt 162 lb 4 oz (73.6 kg)   SpO2 100%   BMI 27.85 kg/m  Physical Exam Constitutional:      General: She is not in acute distress.    Appearance: Normal appearance. She is not ill-appearing.  Cardiovascular:     Rate and Rhythm: Normal rate and regular rhythm.     Pulses: Normal pulses.     Heart sounds: Normal heart sounds. No murmur heard.    No friction rub. No gallop.  Pulmonary:     Effort: Pulmonary effort is normal. No respiratory distress.     Breath sounds: Normal breath sounds. No stridor. No wheezing or rhonchi.  Abdominal:     General: Abdomen is flat. Bowel sounds are normal. There is no distension.     Palpations: Abdomen is soft. There is no mass.     Tenderness: There is no abdominal tenderness.  Musculoskeletal:     Right hip: No tenderness or bony tenderness.  Neurological:     Mental Status: She is alert.       ASSESSMENT/PLAN:  Goals of care, counseling/discussion Assessment & Plan: Patient comes in to discuss goals of care, with potential surgical intervention.  Patient has history of hip/back pain for years.  Patient followed by PMR, who recommended surgical intervention for back/hip pain.  Patient unsure if she wants to have surgery done.  Discussed patient's current quality of life and pain tolerance.  Patient feels she gets around well, and pain is not too bad.  Patient also concerned that she does not have support at home from her children, to help with recovery.  Patient would likely need skilled nursing facility after surgery, however is not wanting to go to facilities.  Patient also unsure of expected results of surgery, and recovery of function/decrease in pain.  Given patient's poor support at home, unwillingness to go to facility, and potential for delayed recovery, or adverse outcome, recommend against patient having surgery.  Discussed with patient, and through shared decision making she felt that she did not want to have surgery.  Patient to let PMR/orthopedist provider now at future visit. 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 [name], MD 07/15/2023, 4:49 PM PGY-3, [facility]

## 2023-07-09 LAB — BASIC METABOLIC PANEL
BUN/Creatinine Ratio: 11 — ABNORMAL LOW (ref 12–28)
BUN: 13 mg/dL (ref 8–27)
CO2: 23 mmol/L (ref 20–29)
Calcium: 10 mg/dL (ref 8.7–10.3)
Chloride: 103 mmol/L (ref 96–106)
Creatinine, Ser: 1.14 mg/dL — ABNORMAL HIGH (ref 0.57–1.00)
Glucose: 99 mg/dL (ref 70–99)
Potassium: 4.2 mmol/L (ref 3.5–5.2)
Sodium: 142 mmol/L (ref 134–144)
eGFR: 48 mL/min/{1.73_m2} — ABNORMAL LOW (ref >59)

## 2023-07-15 DIAGNOSIS — Z7189 Other specified counseling: Secondary | ICD-10-CM | POA: Insufficient documentation

## 2023-07-15 NOTE — Assessment & Plan Note (Signed)
Patient comes in to discuss goals of care, with potential surgical intervention.  Patient has history of hip/back pain for years.  Patient followed by PMR, who recommended surgical intervention for back/hip pain.  Patient unsure if she wants to have surgery done.  Discussed patient's current quality of life and pain tolerance.  Patient feels she gets around well, and pain is not too bad.  Patient also concerned that she does not have support at home from her children, to help with recovery.  Patient would likely need skilled nursing facility after surgery, however is not wanting to go to facilities.  Patient also unsure of expected results of surgery, and recovery of function/decrease in pain.  Given patient's poor support at home, unwillingness to go to facility, and potential for delayed recovery, or adverse outcome, recommend against patient having surgery.  Discussed with patient, and through shared decision making she felt that she did not want to have surgery.  Patient to let PMR/orthopedist provider now at future visit. - Follow-up as needed

## 2023-07-26 NOTE — Progress Notes (Deleted)
Subjective:   Heidi Wolf is a 82 y.o. female who presents for an Initial Medicare Annual Wellness Visit.  Visit Complete: {VISITMETHOD:[phone]}  Patient Medicare AWV questionnaire was completed by the patient on ***; I have confirmed that all information answered by patient is correct and no changes since this date.  Review of Systems    ***   Vital Signs: {telehealth vitals:30100}      Objective:    There were no vitals filed for this visit. There is no height or weight on file to calculate BMI.     07/08/2023   11:16 AM 04/01/2023    9:55 AM 01/14/2023   11:00 AM 01/06/2023   12:43 AM 09/19/2022    2:18 PM 06/17/2022   10:18 AM 06/02/2022   10:41 AM  Advanced Directives  Does Patient Have a Medical Advance Directive? No No No No No No No  Would patient like information on creating a medical advance directive? No - Patient declined No - Patient declined No - Patient declined No - Patient declined  No - Patient declined No - Patient declined    Current Medications (verified) Outpatient Encounter Medications as of 07/27/2023  Medication Sig   ACCU-CHEK AVIVA PLUS test strip USE TO CHECK BLOOD SUGAR UP TO ONCE DAILY AS NEEDED   Accu-Chek Softclix Lancets lancets Please use to check blood sugar once daily.   amLODipine (NORVASC) 10 MG tablet Take 1 tablet (10 mg total) by mouth daily.   aspirin EC 81 MG tablet Take 1 tablet (81 mg total) by mouth daily.   benazepril (LOTENSIN) 20 MG tablet Take 1 tablet (20 mg total) by mouth daily.   CHOLECALCIFEROL PO Take 1,000 Units by mouth.   famotidine (PEPCID) 10 MG tablet Take 1 tablet (10 mg total) by mouth 2 (two) times daily.   gabapentin (NEURONTIN) 100 MG capsule TAKE 1 CAPSULE(100 MG) BY MOUTH TWICE DAILY AS NEEDED   LUMIGAN 0.01 % SOLN Place 1 drop into both eyes at bedtime.    metoprolol tartrate (LOPRESSOR) 25 MG tablet Take 0.5 tablets (12.5 mg total) by mouth 2 (two) times daily.   nitroGLYCERIN (NITROSTAT) 0.4 MG  SL tablet PLACE 1 TABLET UNDER THE TONGUE EVERY 5 MINUTES AS NEEDED FOR CHEST PAIN   polyethylene glycol powder (GLYCOLAX/MIRALAX) 17 GM/SCOOP powder Take 17 g by mouth daily as needed.   rosuvastatin (CRESTOR) 40 MG tablet Take 1 tablet (40 mg total) by mouth daily.   trolamine salicylate (ASPERCREME) 10 % cream Apply 1 application topically as needed for muscle pain.   No facility-administered encounter medications on file as of 07/27/2023.    Allergies (verified) Aspirin   History: Past Medical History:  Diagnosis Date   Allergy    Anemia    Anxiety    Arthritis    "right hip" (09/21/2014)   CAD (coronary artery disease)    PCI 90% diagonal stenosis witha DES July 2017.  Nonobstructive disease elsewhere.    Cataracts, both eyes    Colon polyps    Hyperplastic 2003   External hemorrhoid    GERD (gastroesophageal reflux disease)    Glaucoma    Hyperlipidemia    Hypertension    Internal hemorrhoid    Internal hemorrhoids without mention of complication 03/03/2012   Knee effusion, right 03/05/2020   Myocardial infarction [facility])    Palpitations 09/03/2016   Holter Aug 2017- PACs, no arrhythmia.    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  Severe Lumbar spinal stenosis 09/19/2022   Stenosing tenosynovitis of finger of left hand 05/06/2021   Trigger finger, acquired 05/06/2021   Trigger finger, left 03/08/2021   Type II diabetes mellitus (HCC)    type 2   Whiplash injury, acute, sequela 03/05/2020   Past Surgical History:  Procedure Laterality Date   ABDOMINAL HYSTERECTOMY     APPENDECTOMY     "w/gallbladder"   CARDIAC CATHETERIZATION  X 2   CARDIAC CATHETERIZATION N/A 07/03/2016   Procedure: Left Heart Cath and Coronary Angiography;  Surgeon: Orpah Cobb, MD;  Location: [facility] INVASIVE CV LAB;  Service: Cardiovascular;  Laterality: N/A;   CARDIAC CATHETERIZATION N/A 07/03/2016   Procedure: Coronary Stent Intervention;  Surgeon: Corky Crafts,  MD;  Location[facility] INVASIVE CV LAB;  Service: Cardiovascular;  Laterality: N/A;   CARDIAC CATHETERIZATION N/A 07/15/2016   Procedure: Left Heart Cath and Coronary Angiography;  Surgeon: Orpah Cobb, MD;  Location: [facility] INVASIVE CV LAB;  Service: Cardiovascular;  Laterality: N/A;   CHOLECYSTECTOMY     COLONOSCOPY     CORONARY ANGIOPLASTY WITH STENT PLACEMENT  06/2016   TUBAL LIGATION  1971   Family History  Problem Relation Age of Onset   Diabetes Mother    Arthritis Mother    CAD Mother 21       CABG   CAD Brother    CAD Sister 31       CABG   Stroke Brother    Stroke Daughter    Diabetes Maternal Grandmother    Stroke Maternal Grandmother    Stroke Father    Heart failure Sister    Kidney failure Sister    Breast cancer Other    Colon cancer Neg Hx    Stomach cancer Neg Hx    Esophageal cancer Neg Hx    Pancreatic cancer Neg Hx    Rectal cancer Neg Hx    Social History   Socioeconomic History   Marital status: Legally Separated    Spouse name: Not on file   Number of children: 3   Years of education: Not on file   Highest education level: Not on file  Occupational History   Occupation: retired  Tobacco Use   Smoking status: Never   Smokeless tobacco: Never  Vaping Use   Vaping status: Never Used  Substance and Sexual Activity   Alcohol use: No   Drug use: No   Sexual activity: Never  Other Topics Concern   Not on file  Social History Narrative   LIves with Tammy   Social Determinants of Health   Financial Resource Strain: Not on file  Food Insecurity: Not on file  Transportation Needs: Not on file  Physical Activity: Not on file  Stress: Not on file  Social Connections: Not on file    Tobacco Counseling Counseling given: Not Answered   Clinical Intake:                        Activities of Daily Living     No data to display          Patient Care Team: Pray, Milus Mallick, MD as PCP - General (Family Medicine) Rollene Rotunda,  MD as PCP - Cardiology (Cardiology)  Indicate any recent Medical Services you may have received from other than [facility] providers in the past year (date may be approximate).     Assessment:   This is a routine wellness examination for Leocadia.  Hearing/Vision screen No  results found.  Dietary issues and exercise activities discussed:     Goals Addressed   None    Depression Screen    07/08/2023   11:16 AM 05/21/2023    9:51 AM 04/01/2023    9:55 AM 01/14/2023   10:59 AM 11/27/2022    9:58 AM 10/10/2022   11:04 AM 08/19/2022   11:49 AM  PHQ 2/9 Scores  PHQ - 2 Score 0 0 0 0 0 0 0  PHQ- 9 Score 0  0 2       Fall Risk    07/08/2023   11:16 AM 05/21/2023    9:51 AM 04/01/2023    9:56 AM 01/14/2023   10:59 AM 11/27/2022    9:58 AM  Fall Risk   Falls in the past year? 1 0 0 0 0  Number falls in past yr: 0 0 0 0 0  Injury with Fall? 0 0 0 0 0    MEDICARE RISK AT HOME:   TIMED UP AND GO:  Was the test performed? No    Cognitive Function:        Immunizations Immunization History  Administered Date(s) Administered   Fluad Quad(high Dose 65+) 11/07/2020, 10/02/2021, 09/19/2022   Influenza, High Dose Seasonal PF 10/16/2017   Influenza,inj,Quad PF,6+ Mos 09/22/2016, 08/20/2018, 10/05/2019   PFIZER(Purple Top)SARS-COV-2 Vaccination 02/25/2020, 03/24/2020   Pneumococcal Conjugate-13 09/22/2016   Pneumococcal Polysaccharide-23 09/25/2011   Tdap 12/08/2016    TDAP status: Up to date  Pneumococcal vaccine status: Up to date  Covid-19 vaccine status: Information provided on how to obtain vaccines.   Qualifies for Shingles Vaccine? Yes   Zostavax completed No   Shingrix Completed?: No.    Education has been provided regarding the importance of this vaccine. Patient has been advised to call insurance company to determine out of pocket expense if they have not yet received this vaccine. Advised may also receive vaccine at local pharmacy or Health Dept. Verbalized acceptance and  understanding.  Screening Tests Health Maintenance  Topic Date Due   Medicare Annual Wellness (AWV)  Never done   Diabetic kidney evaluation - Urine ACR  Never done   Zoster Vaccines- Shingrix (1 of 2) Never done   OPHTHALMOLOGY EXAM  12/08/2019   FOOT EXAM  03/08/2022   COVID-19 Vaccine (3 - 2023-24 season) 08/29/2022   INFLUENZA VACCINE  07/30/2023   HEMOGLOBIN A1C  01/08/2024   Diabetic kidney evaluation - eGFR measurement  07/07/2024   DTaP/Tdap/Td (2 - Td or Tdap) 12/08/2026   Pneumonia Vaccine 65+ Years old  Completed   DEXA SCAN  Completed   HPV VACCINES  Aged Out    Health Maintenance  Health Maintenance Due  Topic Date Due   Medicare Annual Wellness (AWV)  Never done   Diabetic kidney evaluation - Urine ACR  Never done   Zoster Vaccines- Shingrix (1 of 2) Never done   OPHTHALMOLOGY EXAM  12/08/2019   FOOT EXAM  03/08/2022   COVID-19 Vaccine (3 - 2023-24 season) 08/29/2022    Colorectal cancer screening: No longer required.   Mammogram status: No longer required due to age and preference.  Bone Density status: Completed 04/20/09. Results reflect: {Bone density results:[id number]}  Lung Cancer Screening: (Low Dose CT Chest recommended if Age 66-80 years, 20 pack-year currently smoking OR have quit w/in 15years.) does not qualify.   Lung Cancer Screening Referral: n/a  Additional Screening:  Hepatitis C Screening: does not qualify  Vision Screening: Recommended annual ophthalmology exams for early  detection of glaucoma and other disorders of the eye. Is the patient up to date with their annual eye exam?  {YES/NO:21197} Who is the provider or what is the name of the office in which the patient attends annual eye exams? *** If pt is not established with a provider, would they like to be referred to a provider to establish care? {YES/NO:21197}.   Dental Screening: Recommended annual dental exams for proper oral hygiene  Diabetic Foot Exam: Diabetic Foot Exam:  Overdue, Pt has been advised about the importance in completing this exam. Pt is scheduled for diabetic foot exam on at next office visit.  Community Resource Referral / Chronic Care Management: CRR required this visit?  {YES/NO:21197}  CCM required this visit?  {CCM Required [phone]}     Plan:     I have personally reviewed and noted the following in the patient's chart:   Medical and social history Use of alcohol, tobacco or illicit drugs  Current medications and supplements including opioid prescriptions. {Opioid [phone]} Functional ability and status Nutritional status Physical activity Advanced directives List of other physicians Hospitalizations, surgeries, and ER visits in previous 12 months Vitals Screenings to include cognitive, depression, and falls Referrals and appointments  In addition, I have reviewed and discussed with patient certain preventive protocols, quality metrics, and best practice recommendations. A written personalized care plan for preventive services as well as general preventive health recommendations were provided to patient.     Kandis Fantasia [place], [place]   03/21/4009   After Visit Summary: {CHL AMB AWV After Visit Summary:[phone]}  Nurse Notes: ***

## 2023-07-27 ENCOUNTER — Ambulatory Visit: Payer: Medicare Other

## 2023-08-14 DIAGNOSIS — H25813 Combined forms of age-related cataract, bilateral: Secondary | ICD-10-CM | POA: Diagnosis not present

## 2023-08-14 DIAGNOSIS — H1045 Other chronic allergic conjunctivitis: Secondary | ICD-10-CM | POA: Diagnosis not present

## 2023-08-14 DIAGNOSIS — H401131 Primary open-angle glaucoma, bilateral, mild stage: Secondary | ICD-10-CM | POA: Diagnosis not present

## 2023-08-14 DIAGNOSIS — H04123 Dry eye syndrome of bilateral lacrimal glands: Secondary | ICD-10-CM | POA: Diagnosis not present

## 2023-08-16 NOTE — Progress Notes (Unsigned)
Subjective:   Heidi Wolf is a 82 y.o. female who presents for Medicare Annual (Subsequent) preventive examination.  Visit Complete: {[phone]}  Patient Medicare AWV questionnaire was completed by the patient on ***; I have confirmed that all information answered by patient is correct and no changes since this date.  Vital Signs: {telehealth vitals:30100}  Review of Systems    ***       Objective:    There were no vitals filed for this visit. There is no height or weight on file to calculate BMI.     07/08/2023   11:16 AM 04/01/2023    9:55 AM 01/14/2023   11:00 AM 01/06/2023   12:43 AM 09/19/2022    2:18 PM 06/17/2022   10:18 AM 06/02/2022   10:41 AM  Advanced Directives  Does Patient Have a Medical Advance Directive? No No No No No No No  Would patient like information on creating a medical advance directive? No - Patient declined No - Patient declined No - Patient declined No - Patient declined  No - Patient declined No - Patient declined    Current Medications (verified) Outpatient Encounter Medications as of 08/17/2023  Medication Sig   ACCU-CHEK AVIVA PLUS test strip USE TO CHECK BLOOD SUGAR UP TO ONCE DAILY AS NEEDED   Accu-Chek Softclix Lancets lancets Please use to check blood sugar once daily.   amLODipine (NORVASC) 10 MG tablet Take 1 tablet (10 mg total) by mouth daily.   aspirin EC 81 MG tablet Take 1 tablet (81 mg total) by mouth daily.   benazepril (LOTENSIN) 20 MG tablet Take 1 tablet (20 mg total) by mouth daily.   CHOLECALCIFEROL PO Take 1,000 Units by mouth.   famotidine (PEPCID) 10 MG tablet Take 1 tablet (10 mg total) by mouth 2 (two) times daily.   gabapentin (NEURONTIN) 100 MG capsule TAKE 1 CAPSULE(100 MG) BY MOUTH TWICE DAILY AS NEEDED   LUMIGAN 0.01 % SOLN Place 1 drop into both eyes at bedtime.    metoprolol tartrate (LOPRESSOR) 25 MG tablet Take 0.5 tablets (12.5 mg total) by mouth 2 (two) times daily.   nitroGLYCERIN  (NITROSTAT) 0.4 MG SL tablet PLACE 1 TABLET UNDER THE TONGUE EVERY 5 MINUTES AS NEEDED FOR CHEST PAIN   polyethylene glycol powder (GLYCOLAX/MIRALAX) 17 GM/SCOOP powder Take 17 g by mouth daily as needed.   rosuvastatin (CRESTOR) 40 MG tablet Take 1 tablet (40 mg total) by mouth daily.   trolamine salicylate (ASPERCREME) 10 % cream Apply 1 application topically as needed for muscle pain.   No facility-administered encounter medications on file as of 08/17/2023.    Allergies (verified) Aspirin   History: Past Medical History:  Diagnosis Date   Allergy    Anemia    Anxiety    Arthritis    "right hip" (09/21/2014)   CAD (coronary artery disease)    PCI 90% diagonal stenosis witha DES July 2017.  Nonobstructive disease elsewhere.    Cataracts, both eyes    Colon polyps    Hyperplastic 2003   External hemorrhoid    GERD (gastroesophageal reflux disease)    Glaucoma    Hyperlipidemia    Hypertension    Internal hemorrhoid    Internal hemorrhoids without mention of complication 03/03/2012   Knee effusion, right 03/05/2020   Myocardial infarction [facility])    Palpitations 09/03/2016   Holter Aug 2017- PACs, no arrhythmia.    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  Severe Lumbar spinal stenosis 09/19/2022   Stenosing tenosynovitis of finger of left hand 05/06/2021   Trigger finger, acquired 05/06/2021   Trigger finger, left 03/08/2021   Type II diabetes mellitus (HCC)    type 2   Whiplash injury, acute, sequela 03/05/2020   Past Surgical History:  Procedure Laterality Date   ABDOMINAL HYSTERECTOMY     APPENDECTOMY     "w/gallbladder"   CARDIAC CATHETERIZATION  X 2   CARDIAC CATHETERIZATION N/A 07/03/2016   Procedure: Left Heart Cath and Coronary Angiography;  Surgeon: Orpah Cobb, MD;  Location: [facility] INVASIVE CV LAB;  Service: Cardiovascular;  Laterality: N/A;   CARDIAC CATHETERIZATION N/A 07/03/2016   Procedure: Coronary Stent Intervention;  Surgeon:  Corky Crafts, MD;  Location: [facility] INVASIVE CV LAB;  Service: Cardiovascular;  Laterality: N/A;   CARDIAC CATHETERIZATION N/A 07/15/2016   Procedure: Left Heart Cath and Coronary Angiography;  Surgeon: Orpah Cobb, MD;  Location: [facility] INVASIVE CV LAB;  Service: Cardiovascular;  Laterality: N/A;   CHOLECYSTECTOMY     COLONOSCOPY     CORONARY ANGIOPLASTY WITH STENT PLACEMENT  06/2016   TUBAL LIGATION  1971   Family History  Problem Relation Age of Onset   Diabetes Mother    Arthritis Mother    CAD Mother 74       CABG   CAD Brother    CAD Sister 4       CABG   Stroke Brother    Stroke Daughter    Diabetes Maternal Grandmother    Stroke Maternal Grandmother    Stroke Father    Heart failure Sister    Kidney failure Sister    Breast cancer Other    Colon cancer Neg Hx    Stomach cancer Neg Hx    Esophageal cancer Neg Hx    Pancreatic cancer Neg Hx    Rectal cancer Neg Hx    Social History   Socioeconomic History   Marital status: Legally Separated    Spouse name: Not on file   Number of children: 3   Years of education: Not on file   Highest education level: Not on file  Occupational History   Occupation: retired  Tobacco Use   Smoking status: Never   Smokeless tobacco: Never  Vaping Use   Vaping status: Never Used  Substance and Sexual Activity   Alcohol use: No   Drug use: No   Sexual activity: Never  Other Topics Concern   Not on file  Social History Narrative   LIves with Tammy   Social Determinants of Health   Financial Resource Strain: Not on file  Food Insecurity: Not on file  Transportation Needs: Not on file  Physical Activity: Not on file  Stress: Not on file  Social Connections: Not on file    Tobacco Counseling Counseling given: Not Answered   Clinical Intake:                        Activities of Daily Living     No data to display          Patient Care Team: Pray, Milus Mallick, MD as PCP - General (Family  Medicine) Rollene Rotunda, MD as PCP - Cardiology (Cardiology)  Indicate any recent Medical Services you may have received from other than [facility] providers in the past year (date may be approximate).     Assessment:   This is a routine wellness examination for [name].  Hearing/Vision screen No  results found.  Dietary issues and exercise activities discussed:     Goals Addressed   None    Depression Screen    07/08/2023   11:16 AM 05/21/2023    9:51 AM 04/01/2023    9:55 AM 01/14/2023   10:59 AM 11/27/2022    9:58 AM 10/10/2022   11:04 AM 08/19/2022   11:49 AM  PHQ 2/9 Scores  PHQ - 2 Score 0 0 0 0 0 0 0  PHQ- 9 Score 0  0 2       Fall Risk    07/08/2023   11:16 AM 05/21/2023    9:51 AM 04/01/2023    9:56 AM 01/14/2023   10:59 AM 11/27/2022    9:58 AM  Fall Risk   Falls in the past year? 1 0 0 0 0  Number falls in past yr: 0 0 0 0 0  Injury with Fall? 0 0 0 0 0    MEDICARE RISK AT HOME:    TIMED UP AND GO:  Was the test performed?  No    Cognitive Function:        Immunizations Immunization History  Administered Date(s) Administered   Fluad Quad(high Dose 65+) 11/07/2020, 10/02/2021, 09/19/2022   Influenza, High Dose Seasonal PF 10/16/2017   Influenza,inj,Quad PF,6+ Mos 09/22/2016, 08/20/2018, 10/05/2019   PFIZER(Purple Top)SARS-COV-2 Vaccination 02/25/2020, 03/24/2020   Pneumococcal Conjugate-13 09/22/2016   Pneumococcal Polysaccharide-23 09/25/2011   Tdap 12/08/2016    TDAP status: Up to date  Flu Vaccine status: Due, Education has been provided regarding the importance of this vaccine. Advised may receive this vaccine at local pharmacy or Health Dept. Aware to provide a copy of the vaccination record if obtained from local pharmacy or Health Dept. Verbalized acceptance and understanding.  Pneumococcal vaccine status: Up to date  Covid-19 vaccine status: Information provided on how to obtain vaccines.   Qualifies for Shingles Vaccine? Yes   Zostavax  completed No   Shingrix Completed?: No.    Education has been provided regarding the importance of this vaccine. Patient has been advised to call insurance company to determine out of pocket expense if they have not yet received this vaccine. Advised may also receive vaccine at local pharmacy or Health Dept. Verbalized acceptance and understanding.  Screening Tests Health Maintenance  Topic Date Due   Medicare Annual Wellness (AWV)  Never done   Diabetic kidney evaluation - Urine ACR  Never done   Zoster Vaccines- Shingrix (1 of 2) Never done   OPHTHALMOLOGY EXAM  12/08/2019   FOOT EXAM  03/08/2022   COVID-19 Vaccine (3 - 2023-24 season) 08/29/2022   INFLUENZA VACCINE  07/30/2023   HEMOGLOBIN A1C  01/08/2024   Diabetic kidney evaluation - eGFR measurement  07/07/2024   DTaP/Tdap/Td (2 - Td or Tdap) 12/08/2026   Pneumonia Vaccine 24+ Years old  Completed   DEXA SCAN  Completed   HPV VACCINES  Aged Out    Health Maintenance  Health Maintenance Due  Topic Date Due   Medicare Annual Wellness (AWV)  Never done   Diabetic kidney evaluation - Urine ACR  Never done   Zoster Vaccines- Shingrix (1 of 2) Never done   OPHTHALMOLOGY EXAM  12/08/2019   FOOT EXAM  03/08/2022   COVID-19 Vaccine (3 - 2023-24 season) 08/29/2022   INFLUENZA VACCINE  07/30/2023    Colorectal cancer screening: No longer required.   Mammogram status: No longer required due to age and preference.  Bone Density status: Completed 04/20/09. Results  reflect: {Bone density results:[id number]}  Lung Cancer Screening: (Low Dose CT Chest recommended if Age 10-80 years, 20 pack-year currently smoking OR have quit w/in 15years.) does not qualify.   Lung Cancer Screening Referral: n/a  Additional Screening:  Hepatitis C Screening: does not qualify  Vision Screening: Recommended annual ophthalmology exams for early detection of glaucoma and other disorders of the eye. Is the patient up to date with their annual eye exam?   Yes  Who is the provider or what is the name of the office in which the patient attends annual eye exams? [facility]  If pt is not established with a provider, would they like to be referred to a provider to establish care? No .   Dental Screening: Recommended annual dental exams for proper oral hygiene  Diabetic Foot Exam: Diabetic Foot Exam: Overdue, Pt has been advised about the importance in completing this exam. Pt is scheduled for diabetic foot exam on at next office visit .  Community Resource Referral / Chronic Care Management: CRR required this visit?  {YES/NO:21197}  CCM required this visit?  {CCM Required choices:[id number]}     Plan:     I have personally reviewed and noted the following in the patient's chart:   Medical and social history Use of alcohol, tobacco or illicit drugs  Current medications and supplements including opioid prescriptions. {Opioid Prescriptions:[phone]} Functional ability and status Nutritional status Physical activity Advanced directives List of other physicians Hospitalizations, surgeries, and ER visits in previous 12 months Vitals Screenings to include cognitive, depression, and falls Referrals and appointments  In addition, I have reviewed and discussed with patient certain preventive protocols, quality metrics, and best practice recommendations. A written personalized care plan for preventive services as well as general preventive health recommendations were provided to patient.     Kandis Fantasia Hartrandt, [place]   06/01/5408   After Visit Summary: {CHL AMB AWV After Visit [phone]}  Nurse Notes: ***

## 2023-08-16 NOTE — Patient Instructions (Incomplete)
Heidi Wolf , Thank you for taking time to come for your Medicare Wellness Visit. I appreciate your ongoing commitment to your health goals. Please review the following plan we discussed and let me know if I can assist you in the future.   Referrals/Orders/Follow-Ups/Clinician Recommendations: Aim for 30 minutes of exercise or brisk walking, 6-8 glasses of water, and 5 servings of fruits and vegetables each day.  This is a list of the screening recommended for you and due dates:  Health Maintenance  Topic Date Due   Yearly kidney health urinalysis for diabetes  Never done   Zoster (Shingles) Vaccine (1 of 2) Never done   Eye exam for diabetics  12/08/2019   Complete foot exam   03/08/2022   COVID-19 Vaccine (4 - 2023-24 season) 07/18/2023   Flu Shot  07/30/2023   Hemoglobin A1C  01/08/2024   Yearly kidney function blood test for diabetes  07/07/2024   Medicare Annual Wellness Visit  08/16/2024   DTaP/Tdap/Td vaccine (2 - Td or Tdap) 12/08/2026   Pneumonia Vaccine  Completed   DEXA scan (bone density measurement)  Completed   HPV Vaccine  Aged Out    Advanced directives: (ACP Link)Information on Advanced Care Planning can be found at [facility] Advance Health Care Directives Advance Health Care Directives ([email or web address]   Next Medicare Annual Wellness Visit scheduled for next year: Yes  Preventive Care 65 Years and Older, Female Preventive care refers to lifestyle choices and visits with your health care provider that can promote health and wellness. What does preventive care include? A yearly physical exam. This is also called an annual well check. Dental exams once or twice a year. Routine eye exams. Ask your health care provider how often you should have your eyes checked. Personal lifestyle choices, including: Daily care of your teeth and gums. Regular physical activity. Eating a healthy diet. Avoiding tobacco and drug use. Limiting alcohol use. Practicing  safe sex. Taking low-dose aspirin every day. Taking vitamin and mineral supplements as recommended by your health care provider. What happens during an annual well check? The services and screenings done by your health care provider during your annual well check will depend on your age, overall health, lifestyle risk factors, and family history of disease. Counseling  Your health care provider may ask you questions about your: Alcohol use. Tobacco use. Drug use. Emotional well-being. Home and relationship well-being. Sexual activity. Eating habits. History of falls. Memory and ability to understand (cognition). Work and work Astronomer. Reproductive health. Screening  You may have the following tests or measurements: Height, weight, and BMI. Blood pressure. Lipid and cholesterol levels. These may be checked every 5 years, or more frequently if you are over 58 years old. Skin check. Lung cancer screening. You may have this screening every year starting at age 41 if you have a 30-pack-year history of smoking and currently smoke or have quit within the past 15 years. Fecal occult blood test (FOBT) of the stool. You may have this test every year starting at age 5. Flexible sigmoidoscopy or colonoscopy. You may have a sigmoidoscopy every 5 years or a colonoscopy every 10 years starting at age 51. Hepatitis C blood test. Hepatitis B blood test. Sexually transmitted disease (STD) testing. Diabetes screening. This is done by checking your blood sugar (glucose) after you have not eaten for a while (fasting). You may have this done every 1-3 years. Bone density scan. This is done to screen for osteoporosis. You may  have this done starting at age 90. Mammogram. This may be done every 1-2 years. Talk to your health care provider about how often you should have regular mammograms. Talk with your health care provider about your test results, treatment options, and if necessary, the need for more  tests. Vaccines  Your health care provider may recommend certain vaccines, such as: Influenza vaccine. This is recommended every year. Tetanus, diphtheria, and acellular pertussis (Tdap, Td) vaccine. You may need a Td booster every 10 years. Zoster vaccine. You may need this after age 68. Pneumococcal 13-valent conjugate (PCV13) vaccine. One dose is recommended after age 1. Pneumococcal polysaccharide (PPSV23) vaccine. One dose is recommended after age 28. Talk to your health care provider about which screenings and vaccines you need and how often you need them. This information is not intended to replace advice given to you by your health care provider. Make sure you discuss any questions you have with your health care provider. Document Released: 01/11/2016 Document Revised: 09/03/2016 Document Reviewed: 10/16/2015 Elsevier Interactive Patient Education  2017 [employer].  Fall Prevention in the Home Falls can cause injuries. They can happen to people of all ages. There are many things you can do to make your home safe and to help prevent falls. What can I do on the outside of my home? Regularly fix the edges of walkways and driveways and fix any cracks. Remove anything that might make you trip as you walk through a door, such as a raised step or threshold. Trim any bushes or trees on the path to your home. Use bright outdoor lighting. Clear any walking paths of anything that might make someone trip, such as rocks or tools. Regularly check to see if handrails are loose or broken. Make sure that both sides of any steps have handrails. Any raised decks and porches should have guardrails on the edges. Have any leaves, snow, or ice cleared regularly. Use sand or salt on walking paths during winter. Clean up any spills in your garage right away. This includes oil or grease spills. What can I do in the bathroom? Use night lights. Install grab bars by the toilet and in the tub and shower.  Do not use towel bars as grab bars. Use non-skid mats or decals in the tub or shower. If you need to sit down in the shower, use a plastic, non-slip stool. Keep the floor dry. Clean up any water that spills on the floor as soon as it happens. Remove soap buildup in the tub or shower regularly. Attach bath mats securely with double-sided non-slip rug tape. Do not have throw rugs and other things on the floor that can make you trip. What can I do in the bedroom? Use night lights. Make sure that you have a light by your bed that is easy to reach. Do not use any sheets or blankets that are too big for your bed. They should not hang down onto the floor. Have a firm chair that has side arms. You can use this for support while you get dressed. Do not have throw rugs and other things on the floor that can make you trip. What can I do in the kitchen? Clean up any spills right away. Avoid walking on wet floors. Keep items that you use a lot in easy-to-reach places. If you need to reach something above you, use a strong step stool that has a grab bar. Keep electrical cords out of the way. Do not use floor polish  or wax that makes floors slippery. If you must use wax, use non-skid floor wax. Do not have throw rugs and other things on the floor that can make you trip. What can I do with my stairs? Do not leave any items on the stairs. Make sure that there are handrails on both sides of the stairs and use them. Fix handrails that are broken or loose. Make sure that handrails are as long as the stairways. Check any carpeting to make sure that it is firmly attached to the stairs. Fix any carpet that is loose or worn. Avoid having throw rugs at the top or bottom of the stairs. If you do have throw rugs, attach them to the floor with carpet tape. Make sure that you have a light switch at the top of the stairs and the bottom of the stairs. If you do not have them, ask someone to add them for you. What else  can I do to help prevent falls? Wear shoes that: Do not have high heels. Have rubber bottoms. Are comfortable and fit you well. Are closed at the toe. Do not wear sandals. If you use a stepladder: Make sure that it is fully opened. Do not climb a closed stepladder. Make sure that both sides of the stepladder are locked into place. Ask someone to hold it for you, if possible. Clearly mark and make sure that you can see: Any grab bars or handrails. First and last steps. Where the edge of each step is. Use tools that help you move around (mobility aids) if they are needed. These include: Canes. Walkers. Scooters. Crutches. Turn on the lights when you go into a dark area. Replace any light bulbs as soon as they burn out. Set up your furniture so you have a clear path. Avoid moving your furniture around. If any of your floors are uneven, fix them. If there are any pets around you, be aware of where they are. Review your medicines with your doctor. Some medicines can make you feel dizzy. This can increase your chance of falling. Ask your doctor what other things that you can do to help prevent falls. This information is not intended to replace advice given to you by your health care provider. Make sure you discuss any questions you have with your health care provider. Document Released: 10/11/2009 Document Revised: 05/22/2016 Document Reviewed: 01/19/2015 Elsevier Interactive Patient Education  2017 [employer].

## 2023-08-17 ENCOUNTER — Ambulatory Visit (INDEPENDENT_AMBULATORY_CARE_PROVIDER_SITE_OTHER): Payer: Medicare Other

## 2023-08-17 VITALS — Ht 64.0 in | Wt 162.0 lb

## 2023-08-17 DIAGNOSIS — Z0000 Encounter for general adult medical examination without abnormal findings: Secondary | ICD-10-CM

## 2023-09-11 LAB — HM DIABETES EYE EXAM

## 2023-10-20 ENCOUNTER — Telehealth: Payer: Self-pay

## 2023-10-21 NOTE — Telephone Encounter (Signed)
Patient need a form for Transportation updated. She will bring it in on her visit next week.

## 2023-10-27 ENCOUNTER — Encounter: Payer: Self-pay | Admitting: Physical Medicine & Rehabilitation

## 2023-10-27 ENCOUNTER — Encounter: Payer: Medicare Other | Attending: Physical Medicine & Rehabilitation | Admitting: Physical Medicine & Rehabilitation

## 2023-10-27 VITALS — BP 122/57 | HR 53 | Ht 64.0 in | Wt 164.0 lb

## 2023-10-27 DIAGNOSIS — M48062 Spinal stenosis, lumbar region with neurogenic claudication: Secondary | ICD-10-CM | POA: Diagnosis not present
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
## 2023-10-27 NOTE — Patient Instructions (Signed)
Please call if Right knee or low back pain has increased

## 2023-11-17 ENCOUNTER — Ambulatory Visit: Payer: Medicare Other | Admitting: Physical Medicine & Rehabilitation

## 2023-11-23 ENCOUNTER — Other Ambulatory Visit: Payer: Self-pay

## 2023-11-23 ENCOUNTER — Encounter (HOSPITAL_COMMUNITY): Payer: Self-pay

## 2023-11-23 ENCOUNTER — Emergency Department (HOSPITAL_COMMUNITY): Payer: Medicare Other

## 2023-11-23 ENCOUNTER — Other Ambulatory Visit: Payer: Self-pay | Admitting: Physician Assistant

## 2023-11-23 ENCOUNTER — Ambulatory Visit (INDEPENDENT_AMBULATORY_CARE_PROVIDER_SITE_OTHER): Payer: Medicare Other | Admitting: Family Medicine

## 2023-11-23 ENCOUNTER — Emergency Department (HOSPITAL_COMMUNITY)
Admission: EM | Admit: 2023-11-23 | Discharge: 2023-11-23 | Disposition: A | Discharge status: Home / Self Care | Payer: Medicare Other | Attending: Emergency Medicine | Admitting: Emergency Medicine

## 2023-11-23 VITALS — BP 132/64 | HR 57 | Wt 160.0 lb

## 2023-11-23 DIAGNOSIS — R072 Precordial pain: Secondary | ICD-10-CM

## 2023-11-23 DIAGNOSIS — I2510 Atherosclerotic heart disease of native coronary artery without angina pectoris: Secondary | ICD-10-CM

## 2023-11-23 DIAGNOSIS — R0789 Other chest pain: Secondary | ICD-10-CM | POA: Diagnosis not present

## 2023-11-23 DIAGNOSIS — Z955 Presence of coronary angioplasty implant and graft: Secondary | ICD-10-CM | POA: Insufficient documentation

## 2023-11-23 DIAGNOSIS — H8110 Benign paroxysmal vertigo, unspecified ear: Secondary | ICD-10-CM | POA: Diagnosis not present

## 2023-11-23 DIAGNOSIS — R42 Dizziness and giddiness: Secondary | ICD-10-CM

## 2023-11-23 DIAGNOSIS — Z7982 Long term (current) use of aspirin: Secondary | ICD-10-CM | POA: Diagnosis not present

## 2023-11-23 DIAGNOSIS — R079 Chest pain, unspecified: Secondary | ICD-10-CM | POA: Insufficient documentation

## 2023-11-23 DIAGNOSIS — R918 Other nonspecific abnormal finding of lung field: Secondary | ICD-10-CM | POA: Diagnosis not present

## 2023-11-23 DIAGNOSIS — E119 Type 2 diabetes mellitus without complications: Secondary | ICD-10-CM | POA: Diagnosis not present

## 2023-11-23 LAB — CBC
HCT: 44.9 % (ref 36.0–46.0)
Hemoglobin: 14.4 g/dL (ref 12.0–15.0)
MCH: 25.9 pg — ABNORMAL LOW (ref 26.0–34.0)
MCHC: 32.1 g/dL (ref 30.0–36.0)
MCV: 80.9 fL (ref 80.0–100.0)
Platelets: 266 10*3/uL (ref 150–400)
RBC: 5.55 MIL/uL — ABNORMAL HIGH (ref 3.87–5.11)
RDW: 14.4 % (ref 11.5–15.5)
WBC: 8.7 10*3/uL (ref 4.0–10.5)
nRBC: 0 % (ref 0.0–0.2)

## 2023-11-23 LAB — BASIC METABOLIC PANEL
Anion gap: 7 (ref 5–15)
BUN: 15 mg/dL (ref 8–23)
CO2: 25 mmol/L (ref 22–32)
Calcium: 9.5 mg/dL (ref 8.9–10.3)
Chloride: 105 mmol/L (ref 98–111)
Creatinine, Ser: 1.01 mg/dL — ABNORMAL HIGH (ref 0.44–1.00)
GFR, Estimated: 56 mL/min — ABNORMAL LOW (ref >60)
Glucose, Bld: 102 mg/dL — ABNORMAL HIGH (ref 70–99)
Potassium: 4 mmol/L (ref 3.5–5.1)
Sodium: 137 mmol/L (ref 135–145)

## 2023-11-23 LAB — CBG MONITORING, ED: Glucose-Capillary: 97 mg/dL (ref 70–99)

## 2023-11-23 LAB — TROPONIN I (HIGH SENSITIVITY)
Troponin I (High Sensitivity): 12 ng/L (ref <18)
Troponin I (High Sensitivity): 14 ng/L (ref <18)

## 2023-11-23 MED ORDER — LORATADINE 10 MG PO TABS: 10.0000 mg | ORAL_TABLET | Freq: Every day | ORAL | 0 refills | Status: AC

## 2023-11-23 MED ORDER — MECLIZINE HCL 25 MG PO TABS
25.0000 mg | ORAL_TABLET | Freq: Once | ORAL | Status: AC
  Administered 2023-11-23: 25 mg via ORAL
  Filled 2023-11-23: qty 1

## 2023-11-23 MED ORDER — ASPIRIN 325 MG PO TABS
325.0000 mg | ORAL_TABLET | Freq: Every day | ORAL | Status: DC
  Administered 2023-11-23: 325 mg via ORAL

## 2023-11-23 MED ORDER — NITROGLYCERIN 0.4 MG SL SUBL: 0.4000 mg | SUBLINGUAL_TABLET | SUBLINGUAL | Status: DC | PRN

## 2023-11-23 NOTE — ED Provider Notes (Signed)
[place] [facility] AT [facility] Provider Note   CSN: [id number] Arrival date & time: 11/23/23  1214     History  Chief Complaint  Patient presents with   Chest Pain    Heidi Wolf is a 82 y.o. female with a history of myocardial infarction, cardiac stent placement at the LAD, right hemiparesis, coronary artery disease, and type 2 diabetes mellitus who presents the ED today for chest pain.  Patient reports pressure to the center of the chest since yesterday that radiates down her right arm and up the right side of her neck.  Additionally, she states that she felt dizzy sensation when she bends down or moves her head since yesterday as well.  She was evaluated by her PCP this morning for the dizziness and a right sided headache. But because of the chest pain, she was instructed to come to the ED for cardiac work up.     Home Medications Prior to Admission medications   Medication Sig Start Date End Date Taking? Authorizing Provider  amLODipine (NORVASC) 10 MG tablet Take 1 tablet (10 mg total) by mouth daily. Patient taking differently: Take 10 mg by mouth in the morning. 03/13/23  Yes Rollene Rotunda, MD  aspirin EC 81 MG tablet Take 1 tablet (81 mg total) by mouth daily. Patient taking differently: Take 81 mg by mouth in the morning. 07/04/16  Yes Orpah Cobb, MD  benazepril (LOTENSIN) 20 MG tablet Take 1 tablet (20 mg total) by mouth daily. Patient taking differently: Take 20 mg by mouth in the morning. 03/13/23  Yes Rollene Rotunda, MD  CHOLECALCIFEROL PO Take 1,000 Units by mouth in the morning.   Yes [provider]  Dihydroxyaluminum Sod Carb (ROLAIDS PO) Take 2 each by mouth daily as needed (heartburn).   Yes [provider]  famotidine (PEPCID) 10 MG tablet Take 1 tablet (10 mg total) by mouth 2 (two) times daily. 01/10/22  Yes Maury Dus, MD  gabapentin (NEURONTIN) 100 MG capsule TAKE 1 CAPSULE(100 MG) BY MOUTH TWICE DAILY  AS NEEDED 07/08/23  Yes Sowell, Apolinar Junes, MD  loratadine (CLARITIN) 10 MG tablet Take 1 tablet (10 mg total) by mouth daily. 11/23/23 12/23/23 Yes Maxwell Marion, PA-C  LUMIGAN 0.01 % SOLN Place 1 drop into both eyes at bedtime.  04/13/14  Yes [provider]  nitroGLYCERIN (NITROSTAT) 0.4 MG SL tablet PLACE 1 TABLET UNDER THE TONGUE EVERY 5 MINUTES AS NEEDED FOR CHEST PAIN Patient taking differently: Place 0.4 mg under the tongue every 5 (five) minutes as needed for chest pain. 04/15/23  Yes Rollene Rotunda, MD  polyethylene glycol powder (GLYCOLAX/MIRALAX) 17 GM/SCOOP powder Take 17 g by mouth daily as needed. 11/07/20  Yes Beard, Samantha N, DO  rosuvastatin (CRESTOR) 40 MG tablet Take 1 tablet (40 mg total) by mouth daily. Patient taking differently: Take 40 mg by mouth every evening. 03/13/23  Yes Rollene Rotunda, MD  tolnaftate (TINACTIN) 1 % external solution Apply 1 Application topically at bedtime. Absorbine Jr   Yes [provider]  trolamine salicylate (ASPERCREME) 10 % cream Apply 1 application topically as needed for muscle pain.   Yes [provider]  ACCU-CHEK AVIVA PLUS test strip USE TO CHECK BLOOD SUGAR UP TO ONCE DAILY AS NEEDED 09/02/22   [name], MD  Accu-Chek Softclix Lancets lancets Please use to check blood sugar once daily. 09/30/22   [name], MD      Allergies    Aspirin  Review of Systems   Review of Systems  Cardiovascular:  Positive for chest pain.  All other systems reviewed and are negative.   Physical Exam Updated Vital Signs BP (!) 157/84 (BP Location: Right Arm)   Pulse 88   Temp 98.1 F (36.7 C) (Oral)   Resp 20   SpO2 100%  Physical Exam Vitals and nursing note reviewed.  Constitutional:      General: She is not in acute distress.    Appearance: Normal appearance.  HENT:     Head: Normocephalic and atraumatic.     Mouth/Throat:     Mouth: Mucous membranes are moist.  Eyes:     Conjunctiva/sclera:  Conjunctivae normal.     Pupils: Pupils are equal, round, and reactive to light.  Cardiovascular:     Rate and Rhythm: Normal rate and regular rhythm.     Pulses: Normal pulses.     Heart sounds: Normal heart sounds.  Pulmonary:     Effort: Pulmonary effort is normal.     Breath sounds: Normal breath sounds.  Abdominal:     Palpations: Abdomen is soft.     Tenderness: There is no abdominal tenderness.  Skin:    General: Skin is warm and dry.     Findings: No rash.  Neurological:     General: No focal deficit present.     Mental Status: She is alert.  Psychiatric:        Mood and Affect: Mood normal.        Behavior: Behavior normal.    ED Results / Procedures / Treatments   Labs (all labs ordered are listed, but only abnormal results are displayed) Labs Reviewed  BASIC METABOLIC PANEL - Abnormal; Notable for the following components:      Result Value   Glucose, Bld 102 (*)    Creatinine, Ser 1.01 (*)    GFR, Estimated 56 (*)    All other components within normal limits  CBC - Abnormal; Notable for the following components:   RBC 5.55 (*)    MCH 25.9 (*)    All other components within normal limits  CBG MONITORING, ED  TROPONIN I (HIGH SENSITIVITY)  TROPONIN I (HIGH SENSITIVITY)    EKG EKG Interpretation Date/Time:  Monday November 23 2023 12:17:47 EST Ventricular Rate:  59 PR Interval:  170 QRS Duration:  88 QT Interval:  440 QTC Calculation: 435 R Axis:   72  Text Interpretation: Sinus bradycardia Minimal voltage criteria for LVH, may be normal variant ( Sokolow-Lyon ) ST & T wave abnormality, consider inferolateral ischemia Abnormal ECG When compared with ECG of 06-Jan-2023 00:41, PREVIOUS ECG IS PRESENT Confirmed by Ernie Avena ([id number]) on 11/23/2023 1:05:15 PM  Radiology DG Chest Portable 1 View  Result Date: 11/23/2023 CLINICAL DATA:  Chest tightness and associated dizziness EXAM: PORTABLE CHEST 1 VIEW COMPARISON:  Chest radiograph dated 01/06/2023  FINDINGS: Normal lung volumes. Bibasilar patchy opacities, right-greater-than-left. Incidentally noted azygous fissure. No pleural effusion or pneumothorax. Similar mildly enlarged cardiomediastinal silhouette. No acute osseous abnormality. IMPRESSION: 1. Bibasilar patchy opacities, right-greater-than-left, likely atelectasis. Aspiration or pneumonia can be considered in the appropriate clinical setting. 2. Similar mild cardiomegaly. Electronically Signed   By: Agustin Cree M.D.   On: 11/23/2023 13:38    Procedures Procedures: not indicated.   Medications Ordered in ED Medications  nitroGLYCERIN (NITROSTAT) SL tablet 0.4 mg (has no administration in time range)  meclizine (ANTIVERT) tablet 25 mg (25 mg Oral Given 11/23/23 1631)  ED Course/ Medical Decision Making/ A&P                                 Medical Decision Making Amount and/or Complexity of Data Reviewed Labs: ordered. Radiology: ordered.  Risk OTC drugs. Prescription drug management.   This patient presents to the ED for concern of chest pain, this involves an extensive number of treatment options, and is a complaint that carries with it a high risk of complications and morbidity.   Differential diagnosis includes: ACS, pneumonia, pneumothorax, pleural effusion,    Comorbidities  See HPI above   Additional History  Additional history obtained from primary care and cardiology notes.   Cardiac Monitoring / EKG  The patient was maintained on a cardiac monitor.  I personally viewed and interpreted the cardiac monitored which showed: Sinus bradycardia with a heart rate of 59 bpm.   Lab Tests  I ordered and personally interpreted labs.  The pertinent results include:   Initial troponin of 12, repeat of 14 BMP and CBC are within normal limits   Imaging Studies  I ordered imaging studies including CXR  I independently visualized and interpreted imaging which showed: Bibasilar patchy opacities, right greater  than the left, likely atelectasis.  Similar mild cardiomegaly. I agree with the radiologist interpretation   Consultations  I requested consultation with cardiology,  and discussed lab and imaging findings as well as pertinent plan - they recommend: Stopping metoprolol and that they would order labs for patient to do out outpatient.  They do not think admission is necessary at this time since patient reports chronic chest pain.   Problem List / ED Course / Critical Interventions / Medication Management  Chest pain I ordered medications including: Meclizine for vertigo  Reevaluation of the patient after these medicines showed that the patient improved I have reviewed the patients home medicines and have made adjustments as needed Patient ambulated independently to the bathroom and showed improvement dizziness.   Social Determinants of Health  Physical activity   Test / Admission - Considered  Discussed findings with patient and family member at bedside.  All questions were answered. She is hemodynamically stable and safe for discharge home. Return precautions given.       Final Clinical Impression(s) / ED Diagnoses Final diagnoses:  Nonspecific chest pain  Benign paroxysmal positional vertigo, unspecified laterality    Rx / DC Orders ED Discharge Orders          Ordered    loratadine (CLARITIN) 10 MG tablet  Daily        11/23/23 2101              Maxwell Marion, PA-C 11/23/23 2238    Ernie Avena, MD 11/24/23 [phone]

## 2023-11-23 NOTE — ED Notes (Signed)
 CCMD called.

## 2023-11-23 NOTE — Progress Notes (Unsigned)
    SUBJECTIVE:   CHIEF COMPLAINT / HPI:   Dizzy  Patient started feeling dizzy yesterday at around 6 am yesterday. She was turning in the bed and got dizzy and felt like everything went dark. They checked her BP and it was only slightly elevated at the time. She had to use the [place] to go to the bathroom. She has been eating and drinking. She still feels unsteady but not as unsteady. She feelsl ike her ears are stopped up and she has a headache on the right side. She feels most dizzy when she bends her head down and turns her head. Patient endorses chest tightness and right arm pain since yesterday as well.   PERTINENT  PMH / PSH: H/o MI with DES in 2017, HLD, T2DM   OBJECTIVE:   BP 132/64  Pulse 57  SpO2 98 %  Weight 160 lb (72.6 kg)  General: well appearing, in no acute distress CV: RRR, radial pulses equal and palpable, no BLE edema  Resp: Normal work of breathing on room air, CTAB Abd: Soft, non tender, non distended  Neuro: Alert & Oriented x 4, PERRLA, EOMI, CN2-12 intact, upper and lower extremities strength equal and 5/5.    ASSESSMENT/PLAN:   Assessment & Plan Dizziness Acute change since yesterday 6am. Seems to be vertigo type symptoms that have not stopped. No other neurologic deficits. Could be menieres vs CVA. However, given patient also reported chest tightness and radiating right arm pain concern for possible ACS.  - Gave 325 mg asa in clinic.  - Called care link for transport of patient to the ED  - Called ED charge nurse to inform of patient's history  - Will follow up in about one week if discharged from the ED.  - Informed patient's daughter that she is being transported to the ED.       Lockie Mola, MD [facility]

## 2023-11-23 NOTE — Consult Note (Addendum)
Cardiology Consultation   Patient ID: Heidi Wolf MRN: [id number]; DOB: 09-20-1941  Admit date: 11/23/2023 Date of Consult: 11/23/2023  PCP:  Billey Co, MD   [facility] Providers Cardiologist:  Rollene Rotunda, MD        Patient Profile:   Heidi Wolf is a 82 y.o. female with a hx of CAD, lumbar spinal stenosis, palpitations/PACs, hypertension, hyperlipidemia, and DM who is being seen 11/23/2023 for the evaluation of chest pain at the request of Dr. Karene Fry.  History of Present Illness:   Heidi Wolf underwent nuclear stress test 2015 that showed inferior wall scarring in the LV with an akinetic cardiac apex and apical lateral segment.  There was no inducible ischemia.  Heart catheterization 07/03/2016 resulted in PCI/DES-diagonal branch.  She underwent a relook heart catheterization 07/15/2016 that showed patent stent.  Echocardiogram with preserved LVEF and no wall motion abnormality. Heart monitor 2017 for palpitations showed PACs and BB was increased.  She was hospitalized in 2021 with chest pain x 2 weeks.  She was admitted overnight for observation and cardiac enzymes remain negative with a nonischemic EKG.  Echocardiogram showed a normal LVEF and no RWMA.  She was discharged the following day without ischemic evaluation.  Chest pain felt noncardiac, possibly viral gastroenteritis.   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 No RWMA, mild MR.   Last seen in clinic by Dr. Antoine Poche 02/2023 and was doing well at that time. She ambulated with a cane and walks through the grocery store.   She saw her PCP today with a myriad of complaints including vertigo, headache, dizziness when she bends her head down and turns her head, and chest tightness radiating to her right arm that started 11/18/2023.  Given concern for ACS, she was given 325 mg ASA in clinic  and transported to the ER for further workup.  On arrival, troponin x 2 negative and EKG appears unchanged from prior. She was administered nitroglycerin x 1.  Cardiology was consulted for evaluation of her chest pain. During evaluation, she reports her main complaint is dizziness.  She reports that she does get intermittent chest pressure and it depends on the chores that she has done that day.  For example mopping the floor with the heavy mop causes some chest pressure that she relates to arthritis.  However she is able to walk to the grocery store and up 3-4 steps without chest discomfort. NTG did not relieve her chest pressure.  She reports this is not like her prior angina in the setting of PCI in 2017.  She denies syncope. She states   She reminds me that her  main complaint is dizziness and headache.   Past Medical History:  Diagnosis Date   Allergy    Anemia    Anxiety    Arthritis    "right hip" (09/21/2014)   CAD (coronary artery disease)    PCI 90% diagonal stenosis witha DES July 2017.  Nonobstructive disease elsewhere.    Cataracts, both eyes    Colon polyps    Hyperplastic 2003   External hemorrhoid    GERD (gastroesophageal reflux disease)    Glaucoma    Hyperlipidemia    Hypertension    Internal hemorrhoid    Internal hemorrhoids without mention of complication 03/03/2012   Knee effusion, right 03/05/2020   Myocardial infarction [facility])    Palpitations 09/03/2016   Holter Aug 2017-  PACs, no arrhythmia.    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   Severe Lumbar spinal stenosis 09/19/2022   Stenosing tenosynovitis of finger of left hand 05/06/2021   Trigger finger, acquired 05/06/2021   Trigger finger, left 03/08/2021   Type II diabetes mellitus (HCC)    type 2   Whiplash injury, acute, sequela 03/05/2020    Past Surgical History:  Procedure Laterality Date   ABDOMINAL HYSTERECTOMY     APPENDECTOMY     "w/gallbladder"   CARDIAC  CATHETERIZATION  X 2   CARDIAC CATHETERIZATION N/A 07/03/2016   Procedure: Left Heart Cath and Coronary Angiography;  Surgeon: Orpah Cobb, MD;  Location: [facility] INVASIVE CV LAB;  Service: Cardiovascular;  Laterality: N/A;   CARDIAC CATHETERIZATION N/A 07/03/2016   Procedure: Coronary Stent Intervention;  Surgeon: Corky Crafts, MD;  Location: [facility] INVASIVE CV LAB;  Service: Cardiovascular;  Laterality: N/A;   CARDIAC CATHETERIZATION N/A 07/15/2016   Procedure: Left Heart Cath and Coronary Angiography;  Surgeon: Orpah Cobb, MD;  Location: [facility] INVASIVE CV LAB;  Service: Cardiovascular;  Laterality: N/A;   CHOLECYSTECTOMY     COLONOSCOPY     CORONARY ANGIOPLASTY WITH STENT PLACEMENT  06/2016   TUBAL LIGATION  1971     Home Medications:  Prior to Admission medications   Medication Sig Start Date End Date Taking? Authorizing Provider  amLODipine (NORVASC) 10 MG tablet Take 1 tablet (10 mg total) by mouth daily. Patient taking differently: Take 10 mg by mouth in the morning. 03/13/23  Yes Rollene Rotunda, MD  aspirin EC 81 MG tablet Take 1 tablet (81 mg total) by mouth daily. Patient taking differently: Take 81 mg by mouth in the morning. 07/04/16  Yes Orpah Cobb, MD  benazepril (LOTENSIN) 20 MG tablet Take 1 tablet (20 mg total) by mouth daily. Patient taking differently: Take 20 mg by mouth in the morning. 03/13/23  Yes Rollene Rotunda, MD  CHOLECALCIFEROL PO Take 1,000 Units by mouth in the morning.   Yes [provider]  Dihydroxyaluminum Sod Carb (ROLAIDS PO) Take 2 each by mouth daily as needed (heartburn).   Yes [provider]  famotidine (PEPCID) 10 MG tablet Take 1 tablet (10 mg total) by mouth 2 (two) times daily. 01/10/22  Yes Maury Dus, MD  gabapentin (NEURONTIN) 100 MG capsule TAKE 1 CAPSULE(100 MG) BY MOUTH TWICE DAILY AS NEEDED 07/08/23  Yes Sowell, Apolinar Junes, MD  LUMIGAN 0.01 % SOLN Place 1 drop into both eyes at bedtime.  04/13/14  Yes [provider]   metoprolol tartrate (LOPRESSOR) 25 MG tablet Take 0.5 tablets (12.5 mg total) by mouth 2 (two) times daily. 03/13/23  Yes Rollene Rotunda, MD  nitroGLYCERIN (NITROSTAT) 0.4 MG SL tablet PLACE 1 TABLET UNDER THE TONGUE EVERY 5 MINUTES AS NEEDED FOR CHEST PAIN Patient taking differently: Place 0.4 mg under the tongue every 5 (five) minutes as needed for chest pain. 04/15/23  Yes Rollene Rotunda, MD  polyethylene glycol powder (GLYCOLAX/MIRALAX) 17 GM/SCOOP powder Take 17 g by mouth daily as needed. 11/07/20  Yes Beard, Samantha N, DO  rosuvastatin (CRESTOR) 40 MG tablet Take 1 tablet (40 mg total) by mouth daily. Patient taking differently: Take 40 mg by mouth every evening. 03/13/23  Yes Rollene Rotunda, MD  tolnaftate (TINACTIN) 1 % external solution Apply 1 Application topically at bedtime. Absorbine Jr   Yes [provider]  trolamine salicylate (ASPERCREME) 10 % cream Apply 1 application topically as needed for muscle  pain.   Yes [provider]  ACCU-CHEK AVIVA PLUS test strip USE TO CHECK BLOOD SUGAR UP TO ONCE DAILY AS NEEDED 09/02/22   [name], MD  Accu-Chek Softclix Lancets lancets Please use to check blood sugar once daily. 09/30/22   [name], MD    Inpatient Medications: Scheduled Meds:  aspirin  325 mg Oral Daily   Continuous Infusions:  PRN Meds: nitroGLYCERIN  Allergies:    Allergies  Allergen Reactions   Aspirin Other (See Comments)    Makes stomach burn    Social History:   Social History   Socioeconomic History   Marital status: Legally Separated    Spouse name: Not on file   Number of children: 3   Years of education: Not on file   Highest education level: Not on file  Occupational History   Occupation: retired  Tobacco Use   Smoking status: Never    Passive exposure: Never   Smokeless tobacco: Never  Vaping Use   Vaping status: Never Used  Substance and Sexual Activity   Alcohol use: No   Drug use: No   Sexual activity:  Never  Other Topics Concern   Not on file  Social History Narrative   LIves with Tammy   Social Determinants of Health   Financial Resource Strain: Low Risk  (08/17/2023)   Overall Financial Resource Strain (CARDIA)    Difficulty of Paying Living Expenses: Not hard at all  Food Insecurity: No Food Insecurity (08/17/2023)   Hunger Vital Sign    Worried About Running Out of Food in the Last Year: Never true    Ran Out of Food in the Last Year: Never true  Transportation Needs: No Transportation Needs (08/17/2023)   PRAPARE - Administrator, Civil Service (Medical): No    Lack of Transportation (Non-Medical): No  Physical Activity: Inactive (08/17/2023)   Exercise Vital Sign    Days of Exercise per Week: 0 days    Minutes of Exercise per Session: 0 min  Stress: No Stress Concern Present (08/17/2023)   [employer] of Occupational Health - Occupational Stress Questionnaire    Feeling of Stress : Not at all  Social Connections: Moderately Isolated (08/17/2023)   Social Connection and Isolation Panel [NHANES]    Frequency of Communication with Friends and Family: More than three times a week    Frequency of Social Gatherings with Friends and Family: Three times a week    Attends Religious Services: 1 to 4 times per year    Active Member of Clubs or Organizations: No    Attends Banker Meetings: Never    Marital Status: Widowed  Intimate Partner Violence: Not At Risk (08/17/2023)   Humiliation, Afraid, Rape, and Kick questionnaire    Fear of Current or Ex-Partner: No    Emotionally Abused: No    Physically Abused: No    Sexually Abused: No    Family History:    Family History  Problem Relation Age of Onset   Diabetes Mother    Arthritis Mother    CAD Mother 36       CABG   CAD Brother    CAD Sister 13       CABG   Stroke Brother    Stroke Daughter    Diabetes Maternal Grandmother    Stroke Maternal Grandmother    Stroke Father    Heart failure  Sister    Kidney failure Sister    Breast cancer Other  Colon cancer Neg Hx    Stomach cancer Neg Hx    Esophageal cancer Neg Hx    Pancreatic cancer Neg Hx    Rectal cancer Neg Hx      ROS:  Please see the history of present illness.   All other ROS reviewed and negative.     Physical Exam/Data:   Vitals:   11/23/23 1500 11/23/23 1515 11/23/23 1545 11/23/23 1650  BP: (!) 140/66 (!) 144/70 (!) 147/79   Pulse: (!) 49 (!) 50 (!) 55   Resp: 14 15 15    Temp:    98.5 F (36.9 C)  TempSrc:    Oral  SpO2: 99% 100% 96%    No intake or output data in the 24 hours ending 11/23/23 1744    11/23/2023   10:44 AM 10/27/2023   11:51 AM 08/17/2023   10:59 AM  Last 3 Weights  Weight (lbs) 160 lb 164 lb 162 lb  Weight (kg) 72.576 kg 74.39 kg 73.483 kg     There is no height or weight on file to calculate BMI.  General:  Well nourished, well developed, in no acute distress HEENT: normal Neck: no JVD Vascular: No carotid bruits; Distal pulses 2+ bilaterally Cardiac:  regular rhythm, bradycardic rate, 2/6 systolic murmur Lungs:  clear to auscultation bilaterally, no wheezing, rhonchi or rales  Abd: soft, nontender, no hepatomegaly  Ext: no edema Musculoskeletal:  No deformities, BUE and BLE strength normal and equal Skin: warm and dry  Neuro:  CNs 2-12 intact, no focal abnormalities noted Psych:  Normal affect   EKG:  The EKG was personally reviewed and demonstrates: SB HR 59, TWI throughout, LVH   Telemetry:  Telemetry was personally reviewed and demonstrates:  sinus bradycardia with pauses up to 2.55 sec  Relevant CV Studies:  Echo 2022  1. Prominent thickening of LV apex with spade like appearance, consider  apical hypertrophic cardiomyopathy.. Left ventricular ejection fraction,  by estimation, is 70 to 75%. The left ventricle has hyperdynamic function.  The left ventricle has no  regional wall motion abnormalities. There is moderate left ventricular  hypertrophy. Left  ventricular diastolic parameters are indeterminate.   2. Right ventricular systolic function is normal. The right ventricular  size is normal.   3. The mitral valve is normal in structure. Mild mitral valve  regurgitation.   4. The aortic valve is normal in structure. Aortic valve regurgitation is  not visualized.   Laboratory Data:  High Sensitivity Troponin:   Recent Labs  Lab 11/23/23 1236 11/23/23 1451  TROPONINIHS 12 14     Chemistry Recent Labs  Lab 11/23/23 1236  NA 137  K 4.0  CL 105  CO2 25  GLUCOSE 102*  BUN 15  CREATININE 1.01*  CALCIUM 9.5  GFRNONAA 56*  ANIONGAP 7    No results for input(s): "PROT", "ALBUMIN", "AST", "ALT", "ALKPHOS", "BILITOT" in the last 168 hours. Lipids No results for input(s): "CHOL", "TRIG", "HDL", "LABVLDL", "LDLCALC", "CHOLHDL" in the last 168 hours.  Hematology Recent Labs  Lab 11/23/23 1236  WBC 8.7  RBC 5.55*  HGB 14.4  HCT 44.9  MCV 80.9  MCH 25.9*  MCHC 32.1  RDW 14.4  PLT 266   Thyroid No results for input(s): "TSH", "FREET4" in the last 168 hours.  BNPNo results for input(s): "BNP", "PROBNP" in the last 168 hours.  DDimer No results for input(s): "DDIMER" in the last 168 hours.   Radiology/Studies:  DG Chest Portable 1 View  Result  Date: 11/23/2023 CLINICAL DATA:  Chest tightness and associated dizziness EXAM: PORTABLE CHEST 1 VIEW COMPARISON:  Chest radiograph dated 01/06/2023 FINDINGS: Normal lung volumes. Bibasilar patchy opacities, right-greater-than-left. Incidentally noted azygous fissure. No pleural effusion or pneumothorax. Similar mildly enlarged cardiomediastinal silhouette. No acute osseous abnormality. IMPRESSION: 1. Bibasilar patchy opacities, right-greater-than-left, likely atelectasis. Aspiration or pneumonia can be considered in the appropriate clinical setting. 2. Similar mild cardiomegaly. Electronically Signed   By: Agustin Cree M.D.   On: 11/23/2023 13:38     Assessment and Plan:   Chest  pain - hs troponin x 2 negative, EKG unchanged - NTG did not relieve her CP - chest pain sounds atypical - chest pressure when using a heavy mop in a sideways motion, no chest pressure with walking through the grocery store or in and out of the house with 3 steps - she tries to stay active and this is not new chest pain  - do not suspect ACS   Dizziness - unclear if this is due to bradycardia and intermittent sinus pauses of 2.55 seconds vs vertigo/noncardiac etiology - recommend stopping lopressor given bradycardia   CAD  Hx of DES-Diag - no recent ischemic evaluation - continue ASA, crestor, 10 mg amlodipine   Mild apical hypertrophy - has been followed by primary cardiologist - has been on 12.5 mg lopressor BID - discontinue this   Hypertension - 10 mg amlodipine, 20 mg benazepril - will not be aggressive with BP control given age and ongoing dizziness   Hyperlipidemia  04/01/2023: Cholesterol, Total 186; HDL 51; LDL Chol Calc (NIH) 114; Triglycerides 120 - continue 40 mg crestor - monitor renal function for dose reduction in crestor   DM - A1c 6.3% Risk Assessment/Risk Scores:      For questions or updates, please contact [facility] Please consult [email or web address] for contact info under   Signed, Marcelino Duster, PA  11/23/2023 5:44 PM As above, patient seen and examined.  Briefly she is an 82 year old female with past medical history of coronary artery disease, hypertension, hyperlipidemia, diabetes mellitus, palpitations, apical hypertrophic cardiomyopathy for evaluation of chest pain.  Patient had previous PCI of the diagonal in 2017.  She has had intermittent chest pain since that time by her report.  She describes it as a chest pressure that is substernal and epigastric.  It occurs with moving her arms, activities and also occasionally at rest in the evening.  Can last 30 minutes at a time with resolution thereafter.  She has not had to take  nitroglycerin.  Patient went to her primary care physician today with complaints of dizziness entheses with turning her head in certain ways and bending over.  She has not had syncope.  She describe occasional chest tightness and was sent to the emergency room.  Presently pain-free.  Electrocardiogram shows sinus bradycardia with inferolateral T wave inversion.  Troponin is 12 and 14.  Hemoglobin 14.4.  Chest x-ray felt to be likely atelectasis.  1 chest pain-symptoms are atypical and chronic.  Her electrocardiogram is abnormal consistent with previous diagnosis of apical hypertrophic cardiomyopathy but unchanged compared to previous.  Her troponins are normal.  From a cardiac standpoint she can be discharged and we will arrange follow-up outpatient PET stress.  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  Will plan repeat echocardiogram following discharge.  3 bradycardia-patient noted to be bradycardic in the emergency room intermittently with heart rate into the 40s.  Discontinue metoprolol and follow.  4 dizziness-occurs with turning her head and occasionally bending over.  I do not think this is cardiac related.  Further evaluation per emergency room.  Probable vertigo.  5 hypertension-continue preadmission blood pressure medications and adjust as needed.  6 hyperlipidemia-continue statin.  Patient can be discharged from a cardiac standpoint with outpatient PET stress, outpatient echocardiogram; discontinue metoprolol.  We will arrange follow-up with APP in 1 to 2 weeks.  Other issues per emergency room.  Olga Millers, MD

## 2023-11-23 NOTE — Discharge Instructions (Addendum)
As discussed, stop your Metoprolol per cardiology's request.  They will be calling in the next several days to schedule appointments for your outpatient testing.  I have attached information how to perform the Epley maneuver.  You can try this at home to see if it helps with the dizziness.  With your primary care in the next 3 to 5 days for reevaluation.  For the dizziness, take antihistamines like Claritin or Zyrtec.  Have sent a prescription to your pharmacy.  You can pick this up.  Take this once a day for the next week.  Get help right away if: Your chest pain gets worse. You have a cough that gets worse, or you cough up blood. You have severe pain in your abdomen. You faint. You have sudden, unexplained chest discomfort. You have sudden, unexplained discomfort in your arms, back, neck, or jaw. You have shortness of breath at any time. You suddenly start to sweat, or your skin gets clammy. You feel nausea or you vomit. You suddenly feel lightheaded or dizzy. You have severe weakness, or unexplained weakness or fatigue. Your heart begins to beat quickly, or it feels like it is skipping beats.

## 2023-11-23 NOTE — ED Notes (Signed)
Pt able to ambulate with cane without difficulty. Son here to take pt home.

## 2023-11-23 NOTE — Progress Notes (Signed)
OP PET stress test and echo

## 2023-11-23 NOTE — ED Triage Notes (Signed)
Patient via [facility] from [facility] office for eval of chest tightness since yesterday with associated dizziness. 324 ASA given by office. 1 nitroglycerin given by [facility] with some relief.

## 2023-11-24 ENCOUNTER — Telehealth: Payer: Self-pay

## 2023-11-24 NOTE — Transitions of Care (Post Inpatient/ED Visit) (Signed)
   11/24/2023  Name: Heidi Wolf MRN: [id number] DOB: Jul 12, 1941  Today's TOC FU Call Status: Today's TOC FU Call Status:: Unsuccessful Call (1st Attempt) Unsuccessful Call (1st Attempt) Date: 11/24/23  Attempted to reach the patient regarding the most recent Inpatient/ED visit.  Follow Up Plan: Additional outreach attempts will be made to reach the patient to complete the Transitions of Care (Post Inpatient/ED visit) call.   Signature Karena Addison, LPN [facility] Nurse Health Advisor Direct Dial [phone]

## 2023-11-25 ENCOUNTER — Telehealth: Payer: Self-pay

## 2023-11-25 NOTE — Telephone Encounter (Signed)
-----   Message from Roe Rutherford Duke sent at 11/23/2023  5:55 PM EST ----- This is a Dr. Antoine Poche patient discharging from the ER.  Pt needs to be set up for a PET stress test and outpatient echocardiogram.   I will order these studies, can you please help arrange these and a follow up appt? FU appt with APP is fine, if Dr. Antoine Poche doesn't have anything.  Thanks Angie

## 2023-11-25 NOTE — Transitions of Care (Post Inpatient/ED Visit) (Signed)
   11/25/2023  Name: Svara Bosquez MRN: [id number] DOB: 1941-03-14  Today's TOC FU Call Status: Today's TOC FU Call Status:: Unsuccessful Call (2nd Attempt) Unsuccessful Call (1st Attempt) Date: 11/24/23 Unsuccessful Call (2nd Attempt) Date: 11/25/23  Attempted to reach the patient regarding the most recent Inpatient/ED visit.  Follow Up Plan: Additional outreach attempts will be made to reach the patient to complete the Transitions of Care (Post Inpatient/ED visit) call.   Signature Karena Addison, LPN [facility] Nurse Health Advisor Direct Dial [phone]

## 2023-11-25 NOTE — Telephone Encounter (Signed)
Already scheduled

## 2023-11-27 ENCOUNTER — Encounter (HOSPITAL_COMMUNITY): Payer: Self-pay

## 2023-11-27 ENCOUNTER — Emergency Department (HOSPITAL_COMMUNITY)
Admission: EM | Admit: 2023-11-27 | Discharge: 2023-11-27 | Disposition: A | Discharge status: Home / Self Care | Payer: Medicare Other | Attending: Emergency Medicine | Admitting: Emergency Medicine

## 2023-11-27 ENCOUNTER — Emergency Department (HOSPITAL_COMMUNITY): Payer: Medicare Other

## 2023-11-27 ENCOUNTER — Other Ambulatory Visit: Payer: Self-pay

## 2023-11-27 DIAGNOSIS — I672 Cerebral atherosclerosis: Secondary | ICD-10-CM | POA: Diagnosis not present

## 2023-11-27 DIAGNOSIS — R519 Headache, unspecified: Secondary | ICD-10-CM | POA: Insufficient documentation

## 2023-11-27 DIAGNOSIS — Z7982 Long term (current) use of aspirin: Secondary | ICD-10-CM | POA: Diagnosis not present

## 2023-11-27 DIAGNOSIS — H538 Other visual disturbances: Secondary | ICD-10-CM | POA: Diagnosis not present

## 2023-11-27 DIAGNOSIS — I6782 Cerebral ischemia: Secondary | ICD-10-CM | POA: Diagnosis not present

## 2023-11-27 DIAGNOSIS — R42 Dizziness and giddiness: Secondary | ICD-10-CM | POA: Diagnosis not present

## 2023-11-27 LAB — BASIC METABOLIC PANEL
Anion gap: 10 (ref 5–15)
BUN: 16 mg/dL (ref 8–23)
CO2: 23 mmol/L (ref 22–32)
Calcium: 9.3 mg/dL (ref 8.9–10.3)
Chloride: 108 mmol/L (ref 98–111)
Creatinine, Ser: 1.1 mg/dL — ABNORMAL HIGH (ref 0.44–1.00)
GFR, Estimated: 50 mL/min — ABNORMAL LOW (ref >60)
Glucose, Bld: 114 mg/dL — ABNORMAL HIGH (ref 70–99)
Potassium: 3.6 mmol/L (ref 3.5–5.1)
Sodium: 141 mmol/L (ref 135–145)

## 2023-11-27 LAB — CBC
HCT: 45.3 % (ref 36.0–46.0)
Hemoglobin: 14.6 g/dL (ref 12.0–15.0)
MCH: 26.3 pg (ref 26.0–34.0)
MCHC: 32.2 g/dL (ref 30.0–36.0)
MCV: 81.6 fL (ref 80.0–100.0)
Platelets: 280 10*3/uL (ref 150–400)
RBC: 5.55 MIL/uL — ABNORMAL HIGH (ref 3.87–5.11)
RDW: 14.1 % (ref 11.5–15.5)
WBC: 8 10*3/uL (ref 4.0–10.5)
nRBC: 0 % (ref 0.0–0.2)

## 2023-11-27 MED ORDER — IOHEXOL 350 MG/ML SOLN
60.0000 mL | Freq: Once | INTRAVENOUS | Status: AC | PRN
  Administered 2023-11-27: 60 mL via INTRAVENOUS

## 2023-11-27 NOTE — ED Triage Notes (Signed)
Pt c/o continued headache, dizziness and blurred vision, pt was evaluated here on Monday and given Rx for allergy medicine. Pt states the medication is not helping. Pt tried to follow up with her PCP today but they are closed today.

## 2023-11-27 NOTE — ED Provider Notes (Addendum)
[place] EMERGENCY DEPARTMENT AT [facility] Provider Note   CSN: [id number] Arrival date & time: 11/27/23  1033     History  Chief Complaint  Patient presents with   Dizziness   Blurred Vision   Headache    Heidi Wolf is a 82 y.o. female.  82 yo F with a chief complaints of a right-sided headache right-sided neck pain and dizziness.  This been going on for about a week now.  She has been seen in the ED multiple times as well as by her PCP.  It was presumed that perhaps this was cardiac in nature and she has follow-up with a cardiologist in the office.  She tells me she is most worried about the headache.  She has been taking what sounds like meclizine but has not had much improvement.  She says when she turns her head in a certain way she has worsening pain and dizziness and has to stop and hold still and that it tends to get better.  She also has a similar issue when she bends over.  She has been able to walk.  Denies one-sided numbness or weakness denies difficulty speech or swallowing.  She denies cough congestion or fever but does feel like her ears are clogged bilaterally.   Dizziness Associated symptoms: headaches   Headache Associated symptoms: dizziness        Home Medications Prior to Admission medications   Medication Sig Start Date End Date Taking? Authorizing Provider  ACCU-CHEK AVIVA PLUS test strip USE TO CHECK BLOOD SUGAR UP TO ONCE DAILY AS NEEDED 09/02/22   [name], MD  Accu-Chek Softclix Lancets lancets Please use to check blood sugar once daily. 09/30/22   [name], MD  amLODipine (NORVASC) 10 MG tablet Take 1 tablet (10 mg total) by mouth daily. Patient taking differently: Take 10 mg by mouth in the morning. 03/13/23   Rollene Rotunda, MD  aspirin EC 81 MG tablet Take 1 tablet (81 mg total) by mouth daily. Patient taking differently: Take 81 mg by mouth in the morning. 07/04/16   Orpah Cobb, MD  benazepril (LOTENSIN) 20 MG  tablet Take 1 tablet (20 mg total) by mouth daily. Patient taking differently: Take 20 mg by mouth in the morning. 03/13/23   Rollene Rotunda, MD  CHOLECALCIFEROL PO Take 1,000 Units by mouth in the morning.    [provider]  Dihydroxyaluminum Sod Carb (ROLAIDS PO) Take 2 each by mouth daily as needed (heartburn).    [provider]  famotidine (PEPCID) 10 MG tablet Take 1 tablet (10 mg total) by mouth 2 (two) times daily. 01/10/22   Maury Dus, MD  gabapentin (NEURONTIN) 100 MG capsule TAKE 1 CAPSULE(100 MG) BY MOUTH TWICE DAILY AS NEEDED 07/08/23   [name], MD  loratadine (CLARITIN) 10 MG tablet Take 1 tablet (10 mg total) by mouth daily. 11/23/23 12/23/23  Maxwell Marion, PA-C  LUMIGAN 0.01 % SOLN Place 1 drop into both eyes at bedtime.  04/13/14   [provider]  nitroGLYCERIN (NITROSTAT) 0.4 MG SL tablet PLACE 1 TABLET UNDER THE TONGUE EVERY 5 MINUTES AS NEEDED FOR CHEST PAIN Patient taking differently: Place 0.4 mg under the tongue every 5 (five) minutes as needed for chest pain. 04/15/23   Rollene Rotunda, MD  polyethylene glycol powder (GLYCOLAX/MIRALAX) 17 GM/SCOOP powder Take 17 g by mouth daily as needed. 11/07/20   Allayne Stack, DO  rosuvastatin (CRESTOR) 40 MG tablet Take 1 tablet (40 mg  total) by mouth daily. Patient taking differently: Take 40 mg by mouth every evening. 03/13/23   Rollene Rotunda, MD  tolnaftate (TINACTIN) 1 % external solution Apply 1 Application topically at bedtime. Absorbine Jr    [provider]  trolamine salicylate (ASPERCREME) 10 % cream Apply 1 application topically as needed for muscle pain.    [provider]      Allergies    Aspirin    Review of Systems   Review of Systems  Neurological:  Positive for dizziness and headaches.    Physical Exam Updated Vital Signs BP (!) 158/62 (BP Location: Left Arm)   Pulse 62   Temp 98.1 F (36.7 C) (Oral)   Resp 16   SpO2 98%  Physical  Exam Vitals and nursing note reviewed.  Constitutional:      General: She is not in acute distress.    Appearance: She is well-developed. She is not diaphoretic.  HENT:     Head: Normocephalic and atraumatic.  Eyes:     Pupils: Pupils are equal, round, and reactive to light.  Cardiovascular:     Rate and Rhythm: Normal rate and regular rhythm.     Heart sounds: No murmur heard.    No friction rub. No gallop.  Pulmonary:     Effort: Pulmonary effort is normal.     Breath sounds: No wheezing or rales.  Abdominal:     General: There is no distension.     Palpations: Abdomen is soft.     Tenderness: There is no abdominal tenderness.  Musculoskeletal:        General: No tenderness.     Cervical back: Normal range of motion and neck supple.  Skin:    General: Skin is warm and dry.  Neurological:     Mental Status: She is alert and oriented to person, place, and time.     GCS: GCS eye subscore is 4. GCS verbal subscore is 5. GCS motor subscore is 6.     Cranial Nerves: Cranial nerves 2-12 are intact.     Sensory: Sensation is intact.     Motor: Motor function is intact.     Coordination: Coordination is intact.     Comments: The patient seems unsure about finger-to-nose but is able to accomplish it without much difficulty.  She says she has some subjective right-sided decree sensation to her face.  Otherwise benign neurologic exam.  Psychiatric:        Behavior: Behavior normal.     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 Additional history obtained from electronic MEDICAL RECORD NUMBERHeadache, dizziness, blurred vision. EXAM: CT ANGIOGRAPHY HEAD AND NECK WITH AND WITHOUT CONTRAST TECHNIQUE: Multidetector CT imaging of the head and neck was performed using the standard protocol during bolus administration of intravenous contrast. Multiplanar CT image reconstructions and MIPs were  obtained to evaluate the vascular anatomy. Carotid stenosis measurements (when applicable) are obtained utilizing NASCET criteria, using the distal internal carotid diameter as the denominator. RADIATION DOSE REDUCTION: This exam was performed according to the departmental dose-optimization program which includes automated exposure control, adjustment of the mA and/or kV according to patient size and/or use of iterative reconstruction technique. CONTRAST:  60mL OMNIPAQUE IOHEXOL 350 MG/ML SOLN COMPARISON:  Brain MRI 10/12/2021.  Head CT 09/30/2021. FINDINGS: CT HEAD FINDINGS Brain: Mild generalized cerebral atrophy. Patchy and ill-defined hypoattenuation within the cerebral white matter, nonspecific but compatible with mild chronic small vessel ischemic disease. Partially empty sella turcica. There is no acute intracranial hemorrhage. No demarcated cortical infarct. No extra-axial fluid collection. No evidence of an intracranial mass. No midline shift. Vascular: No hyperdense vessel.  Atherosclerotic calcifications. Skull: No calvarial fracture or aggressive osseous lesion. Sinuses/Orbits: No orbital mass or acute orbital finding. Mild bilateral anterior ethmoid sinusitis. 10 mm mucous retention cyst within the right sphenoid sinus. Trace secretions within the left sphenoid sinus. Review of the MIP images confirms the above findings CTA NECK FINDINGS Aortic arch: Standard aortic branching. Mild atherosclerotic plaque within the visualized thoracic  aorta. No hemodynamically significant innominate or proximal subclavian artery stenosis. Right carotid system: CCA and ICA patent within the neck. Atherosclerotic plaque about the carotid bifurcation resulting in less than 50% stenosis of the proximal ICA. 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 Left carotid system: CCA and ICA patent within the neck without stenosis. Minimal atherosclerotic plaque within the proximal ICA. Vertebral arteries: Codominant and patent within the neck without stenosis or significant atherosclerotic disease. No evidence of a dissection. 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 Skeleton: Spondylosis at the cervical and visualized upper thoracic levels. No acute fracture or aggressive osseous lesion. Other neck: 13 mm nodule within the right thyroid lobe not meeting consensus criteria for ultrasound follow-up based on size. Upper chest: No consolidation within the imaged lung apices Review of the MIP images confirms the above findings CTA HEAD FINDINGS Anterior circulation: The intracranial internal carotid arteries are patent. Atherosclerotic plaque within both vessels with no more than mild stenosis. The M1 middle cerebral arteries are patent. No M2 proximal branch occlusion or high-grade proximal stenosis. The anterior cerebral arteries are patent. 2 mm inferomedially projecting vascular protrusion arising from the paraclinoid left internal carotid artery (proximal to the posterior communicating artery), consistent with an aneurysm (series 5, image 197). Posterior circulation: The intracranial vertebral arteries are patent. 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 The basilar  artery is patent. The posterior cerebral arteries are patent. Posterior communicating arteries are present bilaterally. Venous sinuses: Within the limitations of contrast timing, no convincing thrombus. Anatomic variants: As described. Review of the MIP images confirms the above findings IMPRESSION: Non-contrast head CT: 1.  No evidence of an acute intracranial abnormality. 2. Mild chronic small vessel ischemic changes within the cerebral white matter. 3. Mild generalized cerebral atrophy. 4. Paranasal sinus disease as described. CTA neck: 1. The common carotid and internal carotid arteries are patent within the neck without hemodynamically significant stenosis (50% or greater). Atherosclerotic plaque about both carotid bifurcations (right greater than left). 2. 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 3. The vertebral arteries are patent within the neck without stenosis or significant atherosclerotic disease. No evidence of dissection. 4. 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 CTA head: 1. No proximal intracranial large vessel occlusion or proximal high-grade arterial stenosis identified. 2. Atherosclerotic plaque within the intracranial internal carotid arteries with no more than mild stenosis. 3. 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 4. 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 Electronically Signed   By: Jackey Loge D.O.   On: 11/27/2023 19:30    Procedures Procedures    Medications Ordered in ED Medications  iohexol (OMNIPAQUE) 350 MG/ML injection 60 mL (60 mLs Intravenous Contrast Given  11/27/23 1835)    ED Course/ Medical Decision Making/ A&P                                 Medical Decision Making Amount and/or Complexity of Data Reviewed Labs: ordered. Radiology: ordered.  Risk Prescription drug management.   82 yo F with a chief complaint to right sided headache and dizziness.  This been going on for about a week.  Seems occur with certain positions.  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  Will obtain a CT angiogram head and neck.  She had lab work obtained, no anemia, no electrolyte abnormalities.  CT without obvious dissection or significant stenosis.  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  I discussed this with Dr. Amada Jupiter, thought this was unlikely to be the cause of her symptoms. recommended neuro interventional radiology follow-up.  11:35 PM:  I have discussed the diagnosis/risks/treatment options with the patient and family.  Evaluation and diagnostic testing in the emergency department does not suggest an emergent condition requiring admission or immediate intervention beyond what has been performed at this time.  They will follow up with PCP, IR. We also discussed returning to the ED immediately if new or worsening sx occur. We discussed the sx which are most concerning (e.g., sudden worsening pain, fever, inability to tolerate by mouth) that necessitate immediate return. Medications administered to the patient during their visit and any new prescriptions provided to the patient are listed below.  Medications given during this visit Medications  iohexol (OMNIPAQUE) 350 MG/ML injection 60 mL (60 mLs Intravenous Contrast Given 11/27/23 1835)     The patient appears reasonably screen and/or stabilized for discharge and I doubt any other medical condition or other [facility] requiring further  screening, evaluation, or treatment in the ED at this time prior to discharge.          Final Clinical Impression(s) / ED Diagnoses Final diagnoses:  Dizziness    Rx / DC Orders ED Discharge Orders     None         Melene Plan, DO 11/27/23 2334    Melene Plan, DO 11/27/23 2335

## 2023-11-27 NOTE — Discharge Instructions (Signed)
Your CT scanning did not show an obvious cause of your symptoms.  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  I discussed this with the neurologist and he recommended following up with an interventional neurologist in the office.  I have given you their information in this paperwork.  Please call them the next working day and set up an appointment.

## 2023-11-30 ENCOUNTER — Other Ambulatory Visit (HOSPITAL_COMMUNITY): Payer: Self-pay | Admitting: Neuroradiology

## 2023-11-30 DIAGNOSIS — I729 Aneurysm of unspecified site: Secondary | ICD-10-CM

## 2023-11-30 NOTE — Transitions of Care (Post Inpatient/ED Visit) (Signed)
   11/30/2023  Name: Heidi Wolf MRN: [id number] DOB: 1941-11-18  Today's TOC FU Call Status: Today's TOC FU Call Status:: Unsuccessful Call (3rd Attempt) Unsuccessful Call (1st Attempt) Date: 11/24/23 Unsuccessful Call (2nd Attempt) Date: 11/25/23 Unsuccessful Call (3rd Attempt) Date: 11/30/23  Attempted to reach the patient regarding the most recent Inpatient/ED visit.  Follow Up Plan: No further outreach attempts will be made at this time. We have been unable to contact the patient.  Signature Karena Addison, LPN [facility] Nurse Health Advisor Direct Dial [phone]

## 2023-12-02 ENCOUNTER — Ambulatory Visit (HOSPITAL_COMMUNITY)
Admission: RE | Admit: 2023-12-02 | Discharge: 2023-12-02 | Disposition: A | Discharge status: Home / Self Care | Payer: Medicare Other | Source: Ambulatory Visit | Attending: Neuroradiology | Admitting: Neuroradiology

## 2023-12-02 DIAGNOSIS — I729 Aneurysm of unspecified site: Secondary | ICD-10-CM

## 2023-12-02 NOTE — Consult Note (Signed)
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  Referring Physician(s): Melene Plan, DO  Supervising Physician: Baldemar Lenis  Patient Status: [facility] - Out-pt  History of Present Illness: Heidi Wolf is a 82 y.o. female with a past medical history of  CAD, lumbar spinal stenosis, palpitations/PACs, hypertension, hyperlipidemia, and DM. She has presented to the Emergency department a couple of times in the last month complaining of dizziness. She says the dizziness is precipitated by head movement when she develops a spinning sensation. She was started on meclizine, but says it is not helping. A CT angiogram of the head and neck was obtained during her last ED visit for dizziness work-up. 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  She denies family history of brain aneurysm, but says her sister had a stroke in the past with associated bleed - unclear if primary hemorrhagic stroke or ischemic infarct with hemorrhagic transformation. She does not smake and has never smoked.   Past Medical History:  Diagnosis Date   Allergy    Anemia    Anxiety    Arthritis    "right hip" (09/21/2014)   CAD (coronary artery disease)    PCI 90% diagonal stenosis witha DES July 2017.  Nonobstructive disease elsewhere.    Cataracts, both eyes    Colon polyps    Hyperplastic 2003   External hemorrhoid    GERD (gastroesophageal reflux disease)    Glaucoma    Hyperlipidemia    Hypertension    Internal hemorrhoid    Internal hemorrhoids without mention of complication 03/03/2012   Knee effusion, right 03/05/2020   Myocardial infarction [facility])    Palpitations 09/03/2016   Holter Aug 2017- PACs, no arrhythmia.     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   Severe Lumbar spinal stenosis 09/19/2022   Stenosing tenosynovitis of finger of left hand 05/06/2021   Trigger finger, acquired 05/06/2021   Trigger finger, left 03/08/2021   Type II diabetes mellitus (HCC)    type 2   Whiplash injury, acute, sequela 03/05/2020    Past Surgical History:  Procedure Laterality Date   ABDOMINAL HYSTERECTOMY     APPENDECTOMY     "w/gallbladder"   CARDIAC CATHETERIZATION  X 2   CARDIAC CATHETERIZATION N/A 07/03/2016   Procedure: Left Heart Cath and Coronary Angiography;  Surgeon: Orpah Cobb, MD;  Location: [facility] INVASIVE CV LAB;  Service: Cardiovascular;  Laterality: N/A;   CARDIAC CATHETERIZATION N/A 07/03/2016   Procedure: Coronary Stent Intervention;  Surgeon: Corky Crafts, MD;  Location: [facility] INVASIVE CV LAB;  Service: Cardiovascular;  Laterality: N/A;   CARDIAC CATHETERIZATION N/A 07/15/2016   Procedure: Left Heart Cath and Coronary Angiography;  Surgeon: Orpah Cobb, MD;  Location: [facility] INVASIVE CV LAB;  Service: Cardiovascular;  Laterality: N/A;   CHOLECYSTECTOMY     COLONOSCOPY     CORONARY ANGIOPLASTY WITH STENT PLACEMENT  06/2016   TUBAL LIGATION  1971    Allergies: Aspirin  Medications: Prior to Admission medications   Medication Sig Start Date End Date Taking? Authorizing Provider  ACCU-CHEK AVIVA PLUS test strip USE TO CHECK BLOOD SUGAR UP TO ONCE DAILY AS NEEDED 09/02/22   [name], MD  Accu-Chek Softclix Lancets lancets Please  use to check blood sugar once daily. 09/30/22   [name], MD  amLODipine (NORVASC) 10 MG tablet Take 1 tablet (10 mg total) by mouth daily. Patient taking differently: Take 10 mg by mouth in the morning. 03/13/23   Rollene Rotunda, MD  aspirin EC 81 MG tablet Take 1 tablet (81 mg total) by mouth daily. Patient taking differently: Take 81 mg by mouth in the morning. 07/04/16   Orpah Cobb, MD  benazepril (LOTENSIN) 20 MG tablet  Take 1 tablet (20 mg total) by mouth daily. Patient taking differently: Take 20 mg by mouth in the morning. 03/13/23   Rollene Rotunda, MD  CHOLECALCIFEROL PO Take 1,000 Units by mouth in the morning.    [provider]  Dihydroxyaluminum Sod Carb (ROLAIDS PO) Take 2 each by mouth daily as needed (heartburn).    [provider]  famotidine (PEPCID) 10 MG tablet Take 1 tablet (10 mg total) by mouth 2 (two) times daily. 01/10/22   Maury Dus, MD  gabapentin (NEURONTIN) 100 MG capsule TAKE 1 CAPSULE(100 MG) BY MOUTH TWICE DAILY AS NEEDED 07/08/23   [name], MD  loratadine (CLARITIN) 10 MG tablet Take 1 tablet (10 mg total) by mouth daily. 11/23/23 12/23/23  Maxwell Marion, PA-C  LUMIGAN 0.01 % SOLN Place 1 drop into both eyes at bedtime.  04/13/14   [provider]  nitroGLYCERIN (NITROSTAT) 0.4 MG SL tablet PLACE 1 TABLET UNDER THE TONGUE EVERY 5 MINUTES AS NEEDED FOR CHEST PAIN Patient taking differently: Place 0.4 mg under the tongue every 5 (five) minutes as needed for chest pain. 04/15/23   Rollene Rotunda, MD  polyethylene glycol powder (GLYCOLAX/MIRALAX) 17 GM/SCOOP powder Take 17 g by mouth daily as needed. 11/07/20   Allayne Stack, DO  rosuvastatin (CRESTOR) 40 MG tablet Take 1 tablet (40 mg total) by mouth daily. Patient taking differently: Take 40 mg by mouth every evening. 03/13/23   Rollene Rotunda, MD  tolnaftate (TINACTIN) 1 % external solution Apply 1 Application topically at bedtime. Absorbine Jr    [provider]  trolamine salicylate (ASPERCREME) 10 % cream Apply 1 application topically as needed for muscle pain.    [provider]     Family History  Problem Relation Age of Onset   Diabetes Mother    Arthritis Mother    CAD Mother 60       CABG   CAD Brother    CAD Sister 30       CABG   Stroke Brother    Stroke Daughter    Diabetes Maternal Grandmother    Stroke Maternal Grandmother    Stroke Father    Heart  failure Sister    Kidney failure Sister    Breast cancer Other    Colon cancer Neg Hx    Stomach cancer Neg Hx    Esophageal cancer Neg Hx    Pancreatic cancer Neg Hx    Rectal cancer Neg Hx     Social History   Socioeconomic History   Marital status: Legally Separated    Spouse name: Not on file   Number of children: 3   Years of education: Not on file   Highest education level: Not on file  Occupational History   Occupation: retired  Tobacco Use   Smoking status: Never    Passive exposure: Never   Smokeless tobacco: Never  Vaping Use   Vaping status: Never Used  Substance and Sexual Activity   Alcohol use: No  Drug use: No   Sexual activity: Never  Other Topics Concern   Not on file  Social History Narrative   LIves with Tammy   Social Determinants of Health   Financial Resource Strain: Low Risk  (08/17/2023)   Overall Financial Resource Strain (CARDIA)    Difficulty of Paying Living Expenses: Not hard at all  Food Insecurity: No Food Insecurity (08/17/2023)   Hunger Vital Sign    Worried About Running Out of Food in the Last Year: Never true    Ran Out of Food in the Last Year: Never true  Transportation Needs: No Transportation Needs (08/17/2023)   PRAPARE - Administrator, Civil Service (Medical): No    Lack of Transportation (Non-Medical): No  Physical Activity: Inactive (08/17/2023)   Exercise Vital Sign    Days of Exercise per Week: 0 days    Minutes of Exercise per Session: 0 min  Stress: No Stress Concern Present (08/17/2023)   [employer] of Occupational Health - Occupational Stress Questionnaire    Feeling of Stress : Not at all  Social Connections: Moderately Isolated (08/17/2023)   Social Connection and Isolation Panel [NHANES]    Frequency of Communication with Friends and Family: More than three times a week    Frequency of Social Gatherings with Friends and Family: Three times a week    Attends Religious Services: 1 to 4 times  per year    Active Member of Clubs or Organizations: No    Attends Banker Meetings: Never    Marital Status: Widowed     Review of Systems: A 12 point ROS discussed and pertinent positives are indicated in the HPI above.  All other systems are negative.  Review of Systems  Vital Signs: There were no vitals taken for this visit.  Physical Exam Constitutional:      Appearance: Normal appearance.  HENT:     Head: Normocephalic and atraumatic.     Mouth/Throat:     Mouth: Mucous membranes are moist.     Pharynx: Oropharynx is clear.  Eyes:     Extraocular Movements: Extraocular movements intact.     Conjunctiva/sclera: Conjunctivae normal.     Pupils: Pupils are equal, round, and reactive to light.  Neurological:     Mental Status: She is alert and oriented to person, place, and time. Mental status is at baseline.     Cranial Nerves: Cranial nerves 2-12 are intact.     Sensory: Sensation is intact.     Motor: Motor function is intact.     Coordination: Coordination is intact.          Imaging: CT ANGIO HEAD NECK W WO CM  Result Date: 11/27/2023 CLINICAL DATA:  Provided history: Vertebral artery dissection suspected. Additional history obtained from electronic MEDICAL RECORD NUMBERHeadache, dizziness, blurred vision. EXAM: CT ANGIOGRAPHY HEAD AND NECK WITH AND WITHOUT CONTRAST TECHNIQUE: Multidetector CT imaging of the head and neck was performed using the standard protocol during bolus administration of intravenous contrast. Multiplanar CT image reconstructions and MIPs were obtained to evaluate the vascular anatomy. Carotid stenosis measurements (when applicable) are obtained utilizing NASCET criteria, using the distal internal carotid diameter as the denominator. RADIATION DOSE REDUCTION: This exam was performed according to the departmental dose-optimization program which includes automated exposure control, adjustment of the mA and/or kV according to patient size  and/or use of iterative reconstruction technique. CONTRAST:  60mL OMNIPAQUE IOHEXOL 350 MG/ML SOLN COMPARISON:  Brain MRI 10/12/2021.  Head CT  09/30/2021. FINDINGS: CT HEAD FINDINGS Brain: Mild generalized cerebral atrophy. Patchy and ill-defined hypoattenuation within the cerebral white matter, nonspecific but compatible with mild chronic small vessel ischemic disease. Partially empty sella turcica. There is no acute intracranial hemorrhage. No demarcated cortical infarct. No extra-axial fluid collection. No evidence of an intracranial mass. No midline shift. Vascular: No hyperdense vessel.  Atherosclerotic calcifications. Skull: No calvarial fracture or aggressive osseous lesion. Sinuses/Orbits: No orbital mass or acute orbital finding. Mild bilateral anterior ethmoid sinusitis. 10 mm mucous retention cyst within the right sphenoid sinus. Trace secretions within the left sphenoid sinus. Review of the MIP images confirms the above findings CTA NECK FINDINGS Aortic arch: Standard aortic branching. Mild atherosclerotic plaque within the visualized thoracic aorta. No hemodynamically significant innominate or proximal subclavian artery stenosis. Right carotid system: CCA and ICA patent within the neck. Atherosclerotic plaque about the carotid bifurcation resulting in less than 50% stenosis of the proximal ICA. 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 Left carotid system: CCA and ICA patent within the neck without stenosis. Minimal atherosclerotic plaque within the proximal ICA. Vertebral arteries: Codominant and patent within the neck without stenosis or significant atherosclerotic disease. No evidence of a dissection. 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 Skeleton: Spondylosis at the cervical and visualized  upper thoracic levels. No acute fracture or aggressive osseous lesion. Other neck: 13 mm nodule within the right thyroid lobe not meeting consensus criteria for ultrasound follow-up based on size. Upper chest: No consolidation within the imaged lung apices Review of the MIP images confirms the above findings CTA HEAD FINDINGS Anterior circulation: The intracranial internal carotid arteries are patent. Atherosclerotic plaque within both vessels with no more than mild stenosis. The M1 middle cerebral arteries are patent. No M2 proximal branch occlusion or high-grade proximal stenosis. The anterior cerebral arteries are patent. 2 mm inferomedially projecting vascular protrusion arising from the paraclinoid left internal carotid artery (proximal to the posterior communicating artery), consistent with an aneurysm (series 5, image 197). Posterior circulation: The intracranial vertebral arteries are patent. 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 The basilar artery is patent. The posterior cerebral arteries are patent. Posterior communicating arteries are present bilaterally. Venous sinuses: Within the limitations of contrast timing, no convincing thrombus. Anatomic variants: As described. Review of the MIP images confirms the above findings IMPRESSION: Non-contrast head CT: 1.  No evidence of an acute intracranial abnormality. 2. Mild chronic small vessel ischemic changes within the cerebral white matter. 3. Mild generalized cerebral atrophy. 4. Paranasal sinus disease as described. CTA neck: 1. The common carotid and internal carotid arteries are patent within the neck without hemodynamically significant stenosis (50% or greater). Atherosclerotic plaque about both carotid bifurcations (right greater than left). 2. 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 3. The vertebral arteries are patent within the neck without stenosis or significant atherosclerotic disease. No evidence of dissection. 4. 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 CTA head: 1. No proximal intracranial large vessel occlusion or proximal high-grade arterial stenosis identified. 2. Atherosclerotic plaque within the intracranial internal carotid arteries with no more  than mild stenosis. 3. 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 4. 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 Electronically Signed   By: Jackey Loge D.O.   On: 11/27/2023 19:30   DG Chest Portable 1 View  Result Date: 11/23/2023 CLINICAL DATA:  Chest tightness and associated dizziness EXAM: PORTABLE CHEST 1 VIEW COMPARISON:  Chest radiograph dated 01/06/2023 FINDINGS: Normal lung volumes. Bibasilar patchy opacities, right-greater-than-left. Incidentally noted azygous fissure. No pleural effusion or pneumothorax. Similar mildly enlarged cardiomediastinal silhouette. No acute osseous abnormality. IMPRESSION: 1. Bibasilar patchy opacities, right-greater-than-left, likely atelectasis. Aspiration or pneumonia can be considered in the appropriate clinical setting. 2. Similar mild cardiomegaly. Electronically Signed   By: Agustin Cree M.D.   On: 11/23/2023 13:38    Labs:  CBC: Recent Labs    01/06/23 0506 11/23/23 1236 11/27/23 1121  WBC 6.5 8.7 8.0  HGB 12.8 14.4 14.6  HCT 39.5 44.9 45.3  PLT 353 266 280    COAGS: No results for input(s): "INR", "APTT" in the last 8760 hours.  BMP: Recent Labs    01/06/23 0506 01/14/23 1133 04/01/23 1616 07/08/23 1200 11/23/23 1236  11/27/23 1121  NA 138   < > 143 142 137 141  K 2.8*   < > 4.2 4.2 4.0 3.6  CL 105   < > 104 103 105 108  CO2 25   < > 23 23 25 23   GLUCOSE 106*   < > 98 99 102* 114*  BUN 11   < > 20 13 15 16   CALCIUM 8.4*   < > 9.8 10.0 9.5 9.3  CREATININE 0.71   < > 1.03* 1.14* 1.01* 1.10*  GFRNONAA >60  --   --   --  56* 50*   < > = values in this interval not displayed.    LIVER FUNCTION TESTS: Recent Labs    01/06/23 0506  BILITOT 0.5  AST 21  ALT 25  ALKPHOS 49  PROT 6.2*  ALBUMIN 2.9*    TUMOR MARKERS: No results for input(s): "AFPTM", "CEA", "CA199", "CHROMGRNA" in the last 8760 hours.  Assessment and Plan:  I reviewed the CT angiogram findings with Heidi Wolf and her son. We also discussed signs and symptoms of acute subarachnoid hemorrhage. They were instructed to call 911 in case aneurysm rupture is suspected.  However, I explained that the intracranial aneurysm is small (2 mm) and the chance of rupture is low. Specifically, I believe the lifetime risk of rupture of such small aneurysm is too small to justify treatment risk in a patient with significant comorbidities. I did explain that we should obtain a follow-up study in 6 months to exclude aneurysm growth. I also recommended screening brain MRA for first degree relatives.  I have also discussed the finding of 2 mm bulging from the cervical right ICA. 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 There is no significant stenosis and no significant rupture risk. Therefore, no specific treatment is necessary to address this finding.   Heidi Wolf and her son understood the plan and asked appropriate questions. We will schedule a follow-up MRA for her in 6 months.  Thank you for this interesting consult.  I greatly enjoyed meeting Heidi Wolf and look forward  to participating in her care.  A copy of this report  was sent to the requesting provider on this date.  Electronically Signed: Baldemar Lenis, MD 12/02/2023, 4:47 PM   I spent a total of  40 Minutes   in face to face in clinical consultation, greater than 50% of which was counseling/coordinating care for brain aneurysm.

## 2023-12-04 ENCOUNTER — Ambulatory Visit: Payer: Medicare Other

## 2023-12-07 ENCOUNTER — Ambulatory Visit (INDEPENDENT_AMBULATORY_CARE_PROVIDER_SITE_OTHER): Payer: Medicare Other | Admitting: Family Medicine

## 2023-12-07 ENCOUNTER — Ambulatory Visit: Payer: Medicare Other | Admitting: Family Medicine

## 2023-12-07 ENCOUNTER — Ambulatory Visit: Payer: Medicare Other

## 2023-12-07 VITALS — BP 148/81 | HR 82 | Ht 64.0 in | Wt 164.0 lb

## 2023-12-07 DIAGNOSIS — I729 Aneurysm of unspecified site: Secondary | ICD-10-CM

## 2023-12-07 DIAGNOSIS — H8113 Benign paroxysmal vertigo, bilateral: Secondary | ICD-10-CM | POA: Diagnosis not present

## 2023-12-07 NOTE — Patient Instructions (Signed)
Good to see you today - Thank you for coming in  Things we discussed today:  1) Your dizziness is most likely caused by Benign Paroxysmal Positional Vertigo, which is caused by disruption of your inner ear fluid which normally helps [place] control our balance. - Please perform the "Epley maneuver" daily. This is an exercise to help cure vertigo. -I am sending a referral for you to see vestibular rehab.  They can help teach you exercises to cure vertigo as well.  Expect a phone call from them in the next few days to help schedule an appointment  2) I am sending a referral for you to see a vascular doctor, they can help you with your pseudoaneurysm. -Expect a phone call from them in the next few days to help schedule an appointment

## 2023-12-07 NOTE — Progress Notes (Signed)
    SUBJECTIVE:   CHIEF COMPLAINT / HPI:   Heidi Wolf is an 82yo F w/ hx of T2 DM, hypertension, CAD, vertigo that presents for ED follow-up -Was seen in clinic 11/25 and ED on 1125 and 1129 for dizziness.  This was thought most likely to be due to BPPV, CTA head and neck notable for pseudoaneurysm of right internal carotid artery.  Per ED note, they recommend follow-up with PCP for further evaluation. - Still having dizziness - feels like her ears feel stuffed up and having dizziness.  - Did not think meclizine was very helpful - Dizziness worsens with turning her head.  -She is sitting still after getting up to let her head stop spinning.  She make sure to turn slowly in bed to minimize dizziness. - Feels like laying down on her back triggers the symptoms really bad -No vomiting -Episodes of dizziness are short, and then will have acute triggers.   OBJECTIVE:   BP (!) 148/81   Pulse 82   Ht 5\' 4"  (1.626 m)   Wt 164 lb (74.4 kg)   SpO2 100%   BMI 28.15 kg/m    General: Alert, pleasant woman. NAD. HEENT: NCAT. MMM. CV: RRR, no murmurs.  Resp: CTAB, no wheezing or crackles. Normal WOB on RA.  Abm: Soft, nontender, nondistended. BS present. Ext: Moves all ext spontaneously Skin: Warm, well perfused  Gait: Pushes to stand and has a unsteady gait, but no focal deficits. Able to walk without [place].   ASSESSMENT/PLAN:   Assessment & Plan Pseudoaneurysm (HCC) Incidentally noted on 11/29 CTA head neck.  Dr. Amada Jupiter was curb sided by ED provider at that time, and did not feel like acute intervention was necessary.  Was recommended outpatient follow-up.  I will send referral to vascular surgery for further evaluation. 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  I provided education on this disease process, and on Epley maneuver.  Patient has a daughter that has a phone, I  advised patient to watch a video on Epley maneuver when they get home as well.  I also handed out instruction handout for Epley maneuver. -Referral sent to vestibular rehab   Heidi Brigham, MD [facility]

## 2023-12-12 ENCOUNTER — Emergency Department (HOSPITAL_COMMUNITY): Payer: Medicare Other

## 2023-12-12 ENCOUNTER — Emergency Department (HOSPITAL_COMMUNITY)
Admission: EM | Admit: 2023-12-12 | Discharge: 2023-12-12 | Disposition: A | Discharge status: Home / Self Care | Payer: Medicare Other | Attending: Emergency Medicine | Admitting: Emergency Medicine

## 2023-12-12 ENCOUNTER — Encounter (HOSPITAL_COMMUNITY): Payer: Self-pay

## 2023-12-12 ENCOUNTER — Other Ambulatory Visit: Payer: Self-pay

## 2023-12-12 DIAGNOSIS — I701 Atherosclerosis of renal artery: Secondary | ICD-10-CM | POA: Diagnosis not present

## 2023-12-12 DIAGNOSIS — I771 Stricture of artery: Secondary | ICD-10-CM | POA: Diagnosis not present

## 2023-12-12 DIAGNOSIS — R0789 Other chest pain: Secondary | ICD-10-CM | POA: Diagnosis not present

## 2023-12-12 DIAGNOSIS — Z7982 Long term (current) use of aspirin: Secondary | ICD-10-CM | POA: Insufficient documentation

## 2023-12-12 DIAGNOSIS — R109 Unspecified abdominal pain: Secondary | ICD-10-CM | POA: Diagnosis not present

## 2023-12-12 DIAGNOSIS — R079 Chest pain, unspecified: Secondary | ICD-10-CM | POA: Diagnosis not present

## 2023-12-12 DIAGNOSIS — R918 Other nonspecific abnormal finding of lung field: Secondary | ICD-10-CM | POA: Diagnosis not present

## 2023-12-12 LAB — CBC
HCT: 44 % (ref 36.0–46.0)
Hemoglobin: 14.1 g/dL (ref 12.0–15.0)
MCH: 26.1 pg (ref 26.0–34.0)
MCHC: 32 g/dL (ref 30.0–36.0)
MCV: 81.5 fL (ref 80.0–100.0)
Platelets: 311 10*3/uL (ref 150–400)
RBC: 5.4 MIL/uL — ABNORMAL HIGH (ref 3.87–5.11)
RDW: 14.2 % (ref 11.5–15.5)
WBC: 8.9 10*3/uL (ref 4.0–10.5)
nRBC: 0 % (ref 0.0–0.2)

## 2023-12-12 LAB — BASIC METABOLIC PANEL
Anion gap: 10 (ref 5–15)
BUN: 16 mg/dL (ref 8–23)
CO2: 22 mmol/L (ref 22–32)
Calcium: 9.3 mg/dL (ref 8.9–10.3)
Chloride: 106 mmol/L (ref 98–111)
Creatinine, Ser: 1.14 mg/dL — ABNORMAL HIGH (ref 0.44–1.00)
GFR, Estimated: 48 mL/min — ABNORMAL LOW (ref >60)
Glucose, Bld: 142 mg/dL — ABNORMAL HIGH (ref 70–99)
Potassium: 3.6 mmol/L (ref 3.5–5.1)
Sodium: 138 mmol/L (ref 135–145)

## 2023-12-12 LAB — TROPONIN I (HIGH SENSITIVITY)
Troponin I (High Sensitivity): 16 ng/L (ref <18)
Troponin I (High Sensitivity): 15 ng/L (ref <18)

## 2023-12-12 MED ORDER — ACETAMINOPHEN 500 MG PO TABS
1000.0000 mg | ORAL_TABLET | Freq: Once | ORAL | Status: AC
  Administered 2023-12-12: 1000 mg via ORAL
  Filled 2023-12-12: qty 2

## 2023-12-12 MED ORDER — IOHEXOL 350 MG/ML SOLN
75.0000 mL | Freq: Once | INTRAVENOUS | Status: AC | PRN
  Administered 2023-12-12: 75 mL via INTRAVENOUS

## 2023-12-12 MED ORDER — MORPHINE SULFATE (PF) 2 MG/ML IV SOLN
2.0000 mg | Freq: Once | INTRAVENOUS | Status: DC
  Filled 2023-12-12: qty 1

## 2023-12-12 NOTE — ED Provider Notes (Signed)
Patient seen after prior ED provider.  Workup is not suggestive of significant acute pathology.  Patient feels improved.  She desires discharge home.  She does understand need for close outpatient follow-up.  Patient offered overnight observation/admission.  She declines this.  Importance of close follow-up stressed.  Strict return precautions given and understood.   Wynetta Fines, MD 12/12/23 2004

## 2023-12-12 NOTE — ED Notes (Signed)
Pt in bed changed into gown. Son in waiting room

## 2023-12-12 NOTE — Discharge Instructions (Signed)
Return for any problem.  ?

## 2023-12-12 NOTE — ED Triage Notes (Signed)
Reports had sudden onset of bil shoulder pain and chest pain along with abd pain.  Patient reports she took 3 nitroglycerin in intervals and it has calmed down some but reports this is what it felt like the last time she had a heart attack.

## 2023-12-12 NOTE — ED Provider Notes (Signed)
[place] EMERGENCY DEPARTMENT AT [facility] Provider Note   CSN: [id number] Arrival date & time: 12/12/23  1103     History  Chief Complaint  Patient presents with   Chest Pain    Heidi Wolf is a 82 y.o. female.  HPI   82 yo female here with CP. She states just prior to arrival she developed CP that radiated to both arms and her abdomen. States it feels similar to previous heart attack. Took nitroglycerin x 3 with minimal relief. Denies SOB or cough. No LE swelling. No fever.   Home Medications Prior to Admission medications   Medication Sig Start Date End Date Taking? Authorizing Provider  ACCU-CHEK AVIVA PLUS test strip USE TO CHECK BLOOD SUGAR UP TO ONCE DAILY AS NEEDED 09/02/22   [name], MD  Accu-Chek Softclix Lancets lancets Please use to check blood sugar once daily. 09/30/22   [name], MD  amLODipine (NORVASC) 10 MG tablet Take 1 tablet (10 mg total) by mouth daily. Patient taking differently: Take 10 mg by mouth in the morning. 03/13/23   Rollene Rotunda, MD  aspirin EC 81 MG tablet Take 1 tablet (81 mg total) by mouth daily. Patient taking differently: Take 81 mg by mouth in the morning. 07/04/16   Orpah Cobb, MD  benazepril (LOTENSIN) 20 MG tablet Take 1 tablet (20 mg total) by mouth daily. Patient taking differently: Take 20 mg by mouth in the morning. 03/13/23   Rollene Rotunda, MD  CHOLECALCIFEROL PO Take 1,000 Units by mouth in the morning.    [provider]  Dihydroxyaluminum Sod Carb (ROLAIDS PO) Take 2 each by mouth daily as needed (heartburn).    [provider]  famotidine (PEPCID) 10 MG tablet Take 1 tablet (10 mg total) by mouth 2 (two) times daily. 01/10/22   Maury Dus, MD  gabapentin (NEURONTIN) 100 MG capsule TAKE 1 CAPSULE(100 MG) BY MOUTH TWICE DAILY AS NEEDED 07/08/23   [name], MD  loratadine (CLARITIN) 10 MG tablet Take 1 tablet (10 mg total) by mouth daily. 11/23/23 12/23/23  Maxwell Marion, PA-C  LUMIGAN 0.01 % SOLN Place 1 drop into both eyes at bedtime.  04/13/14   [provider]  nitroGLYCERIN (NITROSTAT) 0.4 MG SL tablet PLACE 1 TABLET UNDER THE TONGUE EVERY 5 MINUTES AS NEEDED FOR CHEST PAIN Patient taking differently: Place 0.4 mg under the tongue every 5 (five) minutes as needed for chest pain. 04/15/23   Rollene Rotunda, MD  polyethylene glycol powder (GLYCOLAX/MIRALAX) 17 GM/SCOOP powder Take 17 g by mouth daily as needed. 11/07/20   Allayne Stack, DO  rosuvastatin (CRESTOR) 40 MG tablet Take 1 tablet (40 mg total) by mouth daily. Patient taking differently: Take 40 mg by mouth every evening. 03/13/23   Rollene Rotunda, MD  tolnaftate (TINACTIN) 1 % external solution Apply 1 Application topically at bedtime. Absorbine Jr    [provider]  trolamine salicylate (ASPERCREME) 10 % cream Apply 1 application topically as needed for muscle pain.    [provider]      Allergies    Aspirin    Review of Systems   Review of Systems  Constitutional:  Positive for fatigue. Negative for fever.  Respiratory:  Negative for cough and shortness of breath.   Cardiovascular:  Positive for chest pain. Negative for palpitations and leg swelling.  Gastrointestinal:  Positive for abdominal pain. Negative for diarrhea and vomiting.  Skin:  Negative for rash.  Neurological:  Negative  for headaches.    Physical Exam Updated Vital Signs BP (!) 120/58   Pulse 76   Temp 97.8 F (36.6 C)   Resp 19   Ht 5\' 4"  (1.626 m)   Wt 74.4 kg   SpO2 99%   BMI 28.15 kg/m  Physical Exam Vitals and nursing note reviewed.  Constitutional:      General: She is not in acute distress.    Appearance: Normal appearance.  HENT:     Head: Normocephalic.     Mouth/Throat:     Mouth: Mucous membranes are moist.  Cardiovascular:     Rate and Rhythm: Normal rate.  Pulmonary:     Effort: Pulmonary effort is normal. No respiratory distress.  Chest:     Chest wall:  No tenderness.  Abdominal:     General: Bowel sounds are normal.     Palpations: Abdomen is soft.     Tenderness: There is no abdominal tenderness. There is no guarding.  Skin:    General: Skin is warm.  Neurological:     Mental Status: She is alert and oriented to person, place, and time. Mental status is at baseline.  Psychiatric:        Mood and Affect: Mood normal.     ED Results / Procedures / Treatments   Labs (all labs ordered are listed, but only abnormal results are displayed) Labs Reviewed  BASIC METABOLIC PANEL - Abnormal; Notable for the following components:      Result Value   Glucose, Bld 142 (*)    Creatinine, Ser 1.14 (*)    GFR, Estimated 48 (*)    All other components within normal limits  CBC - Abnormal; Notable for the following components:   RBC 5.40 (*)    All other components within normal limits  TROPONIN I (HIGH SENSITIVITY)  TROPONIN I (HIGH SENSITIVITY)    EKG EKG Interpretation Date/Time:  Saturday December 12 2023 11:09:08 EST Ventricular Rate:  103 PR Interval:  152 QRS Duration:  92 QT Interval:  340 QTC Calculation: 445 R Axis:   43  Text Interpretation: Sinus tachycardia ST & T wave abnormality, consider lateral ischemia Abnormal ECG When compared with ECG of 27-Nov-2023 11:14, PREVIOUS ECG IS PRESENT similar to previous Confirmed by Coralee Pesa [id number]) on 12/12/2023 11:59:59 AM  Radiology DG Chest 2 View Result Date: 12/12/2023 CLINICAL DATA:  Chest pain. EXAM: CHEST - 2 VIEW COMPARISON:  11/23/2023 FINDINGS: Normal sized heart. Tortuous and partially calcified thoracic aorta. Clear lungs with normal vascularity. 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 IMPRESSION: No acute abnormality. Electronically Signed   By: Beckie Salts M.D.   On: 12/12/2023 12:27    Procedures Procedures    Medications Ordered in ED Medications - No data to display  ED Course/ Medical Decision Making/ A&P                                  Medical Decision Making Amount and/or Complexity of Data Reviewed Labs: ordered. Radiology: ordered.   82 year old female presents emergency department with chest pain that radiated to both arms associated with abdominal pain.  Took 3 nitroglycerin prior to arrival with minimal improvement.  On my evaluation she has some mild lower abdominal pain but appears comfortable.  Vitals are stable on my evaluation.  Upper and lower extremities are well-perfused.  EKG does not have any acute changes when compared to previous.  Initial troponin is 15, chest x-ray is stable.  Remainder of the blood work is baseline.  Plan for repeat troponin and CTA of the chest to rule out any aneurysm/dissection given the pain pattern.  Patient signed out pending repeat troponin and CTA.        Final Clinical Impression(s) / ED Diagnoses Final diagnoses:  None    Rx / DC Orders ED Discharge Orders     None         Rozelle Logan, DO 12/12/23 1553

## 2023-12-16 ENCOUNTER — Ambulatory Visit: Payer: Medicare Other | Attending: Family Medicine

## 2023-12-16 DIAGNOSIS — R2681 Unsteadiness on feet: Secondary | ICD-10-CM | POA: Diagnosis not present

## 2023-12-16 DIAGNOSIS — R42 Dizziness and giddiness: Secondary | ICD-10-CM | POA: Insufficient documentation

## 2023-12-16 DIAGNOSIS — H8113 Benign paroxysmal vertigo, bilateral: Secondary | ICD-10-CM | POA: Insufficient documentation

## 2023-12-16 NOTE — Therapy (Signed)
OUTPATIENT PHYSICAL THERAPY VESTIBULAR EVALUATION     Patient Name: Heidi Wolf MRN: [id number] DOB:1941-03-12, 82 y.o., female Today's Date: 12/16/2023  END OF SESSION:  PT End of Session - 12/16/23 1311     Visit Number 1    Number of Visits 9    Date for PT Re-Evaluation 01/15/24    Authorization Type Medicare A & B    Progress Note Due on Visit 10    PT Start Time 1315    PT Stop Time 1400    PT Time Calculation (min) 45 min    Activity Tolerance Patient tolerated treatment well    Behavior During Therapy [facility] for tasks assessed/performed             Past Medical History:  Diagnosis Date   Allergy    Anemia    Anxiety    Arthritis    "right hip" (09/21/2014)   CAD (coronary artery disease)    PCI 90% diagonal stenosis witha DES July 2017.  Nonobstructive disease elsewhere.    Cataracts, both eyes    Colon polyps    Hyperplastic 2003   External hemorrhoid    GERD (gastroesophageal reflux disease)    Glaucoma    Hyperlipidemia    Hypertension    Internal hemorrhoid    Internal hemorrhoids without mention of complication 03/03/2012   Knee effusion, right 03/05/2020   Myocardial infarction [facility])    Palpitations 09/03/2016   Holter Aug 2017- PACs, no arrhythmia.    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   Severe Lumbar spinal stenosis 09/19/2022   Stenosing tenosynovitis of finger of left hand 05/06/2021   Trigger finger, acquired 05/06/2021   Trigger finger, left 03/08/2021   Type II diabetes mellitus (HCC)    type 2   Whiplash injury, acute, sequela 03/05/2020   Past Surgical History:  Procedure Laterality Date   ABDOMINAL HYSTERECTOMY     APPENDECTOMY     "w/gallbladder"   CARDIAC CATHETERIZATION  X 2   CARDIAC CATHETERIZATION N/A 07/03/2016   Procedure: Left Heart Cath and Coronary Angiography;  Surgeon: Orpah Cobb, MD;  Location: [facility] INVASIVE CV LAB;  Service: Cardiovascular;  Laterality: N/A;   CARDIAC  CATHETERIZATION N/A 07/03/2016   Procedure: Coronary Stent Intervention;  Surgeon: Corky Crafts, MD;  Location: [facility] INVASIVE CV LAB;  Service: Cardiovascular;  Laterality: N/A;   CARDIAC CATHETERIZATION N/A 07/15/2016   Procedure: Left Heart Cath and Coronary Angiography;  Surgeon: Orpah Cobb, MD;  Location: [facility] INVASIVE CV LAB;  Service: Cardiovascular;  Laterality: N/A;   CHOLECYSTECTOMY     COLONOSCOPY     CORONARY ANGIOPLASTY WITH STENT PLACEMENT  06/2016   TUBAL LIGATION  1971   Patient Active Problem List   Diagnosis Date Noted   Goals of care, counseling/discussion 07/15/2023   Severe Lumbar spinal stenosis 09/19/2022   Burning sensation of foot 06/17/2022   Pain of right hip 06/04/2022   Primary osteoarthritis of right knee 04/02/2021   Generalized osteoarthritis of hand 04/13/2020   Thyroid nodule 04/09/2018   GERD (gastroesophageal reflux disease)    Cataracts, both eyes    Primary open angle glaucoma of both eyes, mild stage 12/04/2016   Hypertension associated with diabetes (HCC) 09/03/2016   Hyperlipidemia associated with type 2 diabetes mellitus (HCC) 09/03/2016   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HCC) 09/03/2016   CAD- S/P PCI July 2017 07/02/2016   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  09/21/2014    PCP: Denny Levy, MD REFERRING PROVIDER: [name], MD  REFERRING DIAG: [phone] (ICD-10-CM) - Benign paroxysmal positional vertigo due to bilateral vestibular disorder  THERAPY DIAG:  Unsteadiness on feet - Plan: PT plan of care cert/re-cert  Dizziness and giddiness - Plan: PT plan of care cert/re-cert  ONSET DATE: 12/07/2023 referral  Rationale for Evaluation and Treatment: Rehabilitation  SUBJECTIVE:   SUBJECTIVE STATEMENT: Patient arrives to clinic alone, using SPC. Having dizziness since Sunday before 25th of November. Per patient, was told she had a problem with her "crystals." Was given Meclizine, but this did not help  her. Has gone to the ER multiple times for the same bout of dizziness. Worse when looking up or with bright lights, states "everything goes dark" and feels as though she's spinning when she turns to the L.  Pt accompanied by: self  PERTINENT HISTORY: anemia, anxiety, CAD, arthritis, cataracts, GERD, glaucoma, HLD, HTN, MI, palpitations, R hemi, DMII,   PAIN:  Are you having pain? Yes: NPRS scale: 5/10 Pain location: R anterior head Pain description: dull, throbbing Aggravating factors: none noticed Relieving factors: tylenol  PRECAUTIONS: Fall   WEIGHT BEARING RESTRICTIONS: No  FALLS: Has patient fallen in last 6 months? Yes. Number of falls 1, missed a step, denies injury  LIVING ENVIRONMENT: Lives with: lives with their daughter Lives in: House/apartment Stairs: Yes: External: 3 steps; can reach both Has following equipment at home: Single point cane, Stordahl - 2 wheeled, Environmental consultant - 4 wheeled, and shower chair  PLOF: Independent  PATIENT GOALS: "to get back straight"  OBJECTIVE:  Note: Objective measures were completed at Evaluation unless otherwise noted.  DIAGNOSTIC FINDINGS: CTA head:   1. No proximal intracranial large vessel occlusion or proximal high-grade arterial stenosis identified. 2. Atherosclerotic plaque within the intracranial internal carotid arteries with no more than mild stenosis. 3. 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 4. 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  CTA neck:   1. The common carotid and internal carotid arteries are patent within the neck without hemodynamically significant stenosis (50% or greater). Atherosclerotic plaque about both carotid bifurcations (right greater than left). 2. 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 3. The vertebral arteries are patent within the neck without stenosis or significant atherosclerotic disease. No evidence of dissection. 4. 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  IMPRESSION: Non-contrast head CT:   1.  No evidence of an acute intracranial abnormality. 2. Mild chronic small vessel ischemic changes within the cerebral white matter. 3. Mild generalized cerebral atrophy. 4. Paranasal sinus disease as described.  COGNITION: Overall cognitive status: Within functional limits for tasks assessed   POSTURE:  rounded shoulders, forward head, increased thoracic kyphosis, and flexed trunk   Cervical ROM:   WFL, no pain  STRENGTH: WFL    BED MOBILITY:  Independent, but endorses dizziness   GAIT: Gait pattern: step through pattern Distance walked: clinic  Assistive device utilized: Single point cane Level of assistance: Modified independence  PATIENT SURVEYS:  FOTO 39, expected to be at 56  VESTIBULAR ASSESSMENT:  GENERAL OBSERVATION: NAD, using SPC   SYMPTOM BEHAVIOR:  Subjective history: see above  Non-Vestibular  symptoms: changes in vision, headaches, and nausea/vomiting "black squiggles"; not as clear thinking  Type of dizziness: Spinning/Vertigo  Frequency: with certain movements  Duration: seconds to minutes  Aggravating factors: Induced by position change: rolling to the right, rolling to the left, and supine to sit, Induced by motion: looking up at the ceiling, bending down to the ground, turning body quickly, and turning head quickly, Worse in the morning, and Worse in the dark  Relieving factors: dark room, closing eyes, rest, and slow movements  Progression of symptoms: unchanged  OCULOMOTOR EXAM:  Ocular Alignment: normal  Ocular ROM: No  Limitations  Spontaneous Nystagmus: absent  Gaze-Induced Nystagmus: absent  Smooth Pursuits: saccades in vertical plane  Saccades: extra eye movements  Convergence/Divergence: ~6 cm    VESTIBULAR - OCULAR REFLEX:   Slow VOR: Positive Bilaterally  VOR Cancellation: Unable to Maintain Gaze  Head-Impulse Test: HIT Right: positive HIT Left: positive   POSITIONAL TESTING: Right Roll Test: no nystagmus Left Roll Test: no nystagmus Right Sidelying: no nystagmus Left Sidelying: upbeating, left nystagmus  MOTION SENSITIVITY:  Motion Sensitivity Quotient Intensity: 0 = none, 1 = Lightheaded, 2 = Mild, 3 = Moderate, 4 = Severe, 5 = Vomiting  Intensity  1. Sitting to supine   2. Supine to L side   3. Supine to R side   4. Supine to sitting   5. L Hallpike-Dix   6. Up from L    7. R Hallpike-Dix   8. Up from R    9. Sitting, head tipped to L knee   10. Head up from L knee   11. Sitting, head tipped to R knee   12. Head up from R knee   13. Sitting head turns x5   14.Sitting head nods x5   15. In stance, 180 turn to L    16. In stance, 180 turn to R      VESTIBULAR TREATMENT:                                                                                                    Habituation:  Brandt-Daroff: comment: 2 reps B, reported consistent "spinning" however, no nystagmus observed after ~8s    PATIENT EDUCATION: Education details: PT POC, exam findings, brandt daroff  Person educated: Patient Education method: Explanation and Handouts Education comprehension: verbalized understanding and needs further education  HOME EXERCISE PROGRAM: Sit to Side-Lying    Sit on edge of bed. 1. Turn head 45 to right. 2. Maintain head position and lie down slowly on left side. Hold until symptoms subside. 3. Sit up slowly. Hold until symptoms subside. 4. Turn head 45 to left. 5. Maintain head position and lie down slowly on right side. Hold until symptoms subside. 6. Sit up  slowly. Repeat sequence __4__ times per session. Do __3__ sessions per day.   GOALS: Goals reviewed with patient? Yes  SHORT TERM GOALS: = LTG based on PT POC length   LONG TERM GOALS: Target date: 01/15/24  Pt will be independent with final HEP for improved symptoms Baseline: to be provided Goal  status: INITIAL  2.  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  Patient will score >/= 56 on FOTO to demonstrate improved symptoms Baseline: 39 Goal status: INITIAL    ASSESSMENT:  CLINICAL IMPRESSION: Patient is a 82 y.o. female who was seen today for physical therapy evaluation and treatment for dizziness. Subjectively, patient describes BPPV, with reports of spinning vertigo with positional changes. However, patient also presenting with a significant number of central exam findings, such as saccades in the vertical plane, impaired VOR, impaired VOR cancellation, and a bilaterally positive HIT. She had recent head imaging, but CT, not MRI. She could possibly have L posterior canal canalithiasis, based on results with L sidelying test, however, clinical image is clouded by robust central findings. She would benefit from a trial of PT to address the above mentioned deficits.    OBJECTIVE IMPAIRMENTS: Abnormal gait, decreased balance, decreased knowledge of condition, and dizziness.   ACTIVITY LIMITATIONS: carrying, lifting, bed mobility, locomotion level, and caring for others  PARTICIPATION LIMITATIONS: meal prep, cleaning, driving, shopping, and community activity  PERSONAL FACTORS: Age, Past/current experiences, Sex, Time since onset of injury/illness/exacerbation, and 3+ comorbidities: see above   are also affecting patient's functional outcome.   REHAB POTENTIAL: Fair time since onset, etiology   CLINICAL DECISION MAKING: Evolving/moderate complexity  EVALUATION  COMPLEXITY: Moderate   PLAN:  PT FREQUENCY: 2x/week  PT DURATION: 4 weeks  PLANNED INTERVENTIONS: 97164- PT Re-evaluation, 97110-Therapeutic exercises, 97530- Therapeutic activity, [id number]- Neuromuscular re-education, 97535- Self Care, 16109- Manual therapy, [phone]- Gait training, [phone]- Canalith repositioning, Patient/Family education, Balance training, Stair training, Vestibular training, Visual/preceptual remediation/compensation, and DME instructions  PLAN FOR NEXT SESSION: reassess positional, maybe even VOR again?   Westley Foots, PT Westley Foots, PT, DPT, CBIS  12/16/2023, 3:56 PM

## 2023-12-17 ENCOUNTER — Telehealth: Payer: Self-pay | Admitting: Cardiology

## 2023-12-17 NOTE — Telephone Encounter (Signed)
Patient is calling because she is confused about the test she has scheduled on 02/03/24. Patient had tests completed in the hospital and is unsure if she still needs to complete this test. Please advise.

## 2023-12-17 NOTE — Telephone Encounter (Signed)
Spoke to patient advised she needs to keep all appointments as scheduled.Echo scheduled 12/23 at 11:00 am.Cardiac Pet Scan 2/5 at 10:40 am.Advised to keep appointment with Dr.Hochrein 1/10 at 9:40 am.Advised to ask for Cardiac Pet Scan instructions at visit.

## 2023-12-21 ENCOUNTER — Ambulatory Visit (HOSPITAL_COMMUNITY)
Admission: RE | Admit: 2023-12-21 | Discharge: 2023-12-21 | Disposition: A | Discharge status: Home / Self Care | Payer: Medicare Other | Source: Ambulatory Visit | Attending: Cardiology | Admitting: Cardiology

## 2023-12-21 DIAGNOSIS — I2510 Atherosclerotic heart disease of native coronary artery without angina pectoris: Secondary | ICD-10-CM | POA: Insufficient documentation

## 2023-12-21 DIAGNOSIS — Z9861 Coronary angioplasty status: Secondary | ICD-10-CM | POA: Insufficient documentation

## 2023-12-21 DIAGNOSIS — R072 Precordial pain: Secondary | ICD-10-CM | POA: Diagnosis not present

## 2023-12-21 LAB — ECHOCARDIOGRAM COMPLETE
AR max vel: 1.43 cm2
AV Area VTI: 1.45 cm2
AV Area mean vel: 1.39 cm2
AV Mean grad: 4 mm[Hg]
AV Peak grad: 8.3 mm[Hg]
Ao pk vel: 1.44 m/s
MV M vel: 2.82 m/s
MV Peak grad: 31.8 mm[Hg]
S' Lateral: 2.37 cm

## 2023-12-29 ENCOUNTER — Ambulatory Visit: Payer: Medicare Other

## 2023-12-29 VITALS — BP 136/68 | HR 63

## 2023-12-29 DIAGNOSIS — R42 Dizziness and giddiness: Secondary | ICD-10-CM | POA: Diagnosis not present

## 2023-12-29 DIAGNOSIS — R2681 Unsteadiness on feet: Secondary | ICD-10-CM | POA: Diagnosis not present

## 2023-12-29 DIAGNOSIS — H8113 Benign paroxysmal vertigo, bilateral: Secondary | ICD-10-CM | POA: Diagnosis not present

## 2023-12-29 NOTE — Therapy (Signed)
 OUTPATIENT PHYSICAL THERAPY VESTIBULAR TREATMENT     Patient Name: Heidi Wolf MRN: [id number] DOB:14-Jul-1941, 82 y.o., female Today's Date: 12/29/2023  END OF SESSION:  PT End of Session - 12/29/23 1254     Visit Number 2    Number of Visits 9    Date for PT Re-Evaluation 01/15/24    Authorization Type Medicare A & B    Progress Note Due on Visit 10    PT Start Time 1314    PT Stop Time 1358    PT Time Calculation (min) 44 min    Activity Tolerance Patient tolerated treatment well    Behavior During Therapy [facility] for tasks assessed/performed             Past Medical History:  Diagnosis Date   Allergy    Anemia    Anxiety    Arthritis    right hip (09/21/2014)   CAD (coronary artery disease)    PCI 90% diagonal stenosis witha DES July 2017.  Nonobstructive disease elsewhere.    Cataracts, both eyes    Colon polyps    Hyperplastic 2003   External hemorrhoid    GERD (gastroesophageal reflux disease)    Glaucoma    Hyperlipidemia    Hypertension    Internal hemorrhoid    Internal hemorrhoids without mention of complication 03/03/2012   Knee effusion, right 03/05/2020   Myocardial infarction [facility])    Palpitations 09/03/2016   Holter Aug 2017- PACs, no arrhythmia.    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   Severe Lumbar spinal stenosis 09/19/2022   Stenosing tenosynovitis of finger of left hand 05/06/2021   Trigger finger, acquired 05/06/2021   Trigger finger, left 03/08/2021   Type II diabetes mellitus (HCC)    type 2   Whiplash injury, acute, sequela 03/05/2020   Past Surgical History:  Procedure Laterality Date   ABDOMINAL HYSTERECTOMY     APPENDECTOMY     w/gallbladder   CARDIAC CATHETERIZATION  X 2   CARDIAC CATHETERIZATION N/A 07/03/2016   Procedure: Left Heart Cath and Coronary Angiography;  Surgeon: Salena Negri, MD;  Location: [facility] INVASIVE CV LAB;  Service: Cardiovascular;  Laterality: N/A;   CARDIAC  CATHETERIZATION N/A 07/03/2016   Procedure: Coronary Stent Intervention;  Surgeon: Candyce GORMAN Reek, MD;  Location: [facility] INVASIVE CV LAB;  Service: Cardiovascular;  Laterality: N/A;   CARDIAC CATHETERIZATION N/A 07/15/2016   Procedure: Left Heart Cath and Coronary Angiography;  Surgeon: Salena Negri, MD;  Location: [facility] INVASIVE CV LAB;  Service: Cardiovascular;  Laterality: N/A;   CHOLECYSTECTOMY     COLONOSCOPY     CORONARY ANGIOPLASTY WITH STENT PLACEMENT  06/2016   TUBAL LIGATION  1971   Patient Active Problem List   Diagnosis Date Noted   Goals of care, counseling/discussion 07/15/2023   Severe Lumbar spinal stenosis 09/19/2022   Burning sensation of foot 06/17/2022   Pain of right hip 06/04/2022   Primary osteoarthritis of right knee 04/02/2021   Generalized osteoarthritis of hand 04/13/2020   Thyroid  nodule 04/09/2018   GERD (gastroesophageal reflux disease)    Cataracts, both eyes    Primary open angle glaucoma of both eyes, mild stage 12/04/2016   Hypertension associated with diabetes (HCC) 09/03/2016   Hyperlipidemia associated with type 2 diabetes mellitus (HCC) 09/03/2016   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HCC) 09/03/2016   CAD- S/P PCI July 2017 07/02/2016   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  09/21/2014    PCP: Camie Mulch, MD REFERRING PROVIDER: Penne Rhein, MD  REFERRING DIAG: [phone] (ICD-10-CM) - Benign paroxysmal positional vertigo due to bilateral vestibular disorder  THERAPY DIAG:  Unsteadiness on feet  Dizziness and giddiness  ONSET DATE: 12/07/2023 referral  Rationale for Evaluation and Treatment: Rehabilitation  SUBJECTIVE:   SUBJECTIVE STATEMENT: Patient reports doing fair. Overall better, but still with dizziness when bending down for too long, looking up for too long or rolling to her L too fast. Denies falls. Did do the [employer] exercises and tolerated well.  Pt accompanied by: self  PERTINENT HISTORY:  anemia, anxiety, CAD, arthritis, cataracts, GERD, glaucoma, HLD, HTN, MI, palpitations, R hemi, DMII,   PAIN:  Are you having pain? Yes: NPRS scale: 5/10 Pain location: R anterior head Pain description: dull, throbbing Aggravating factors: none noticed Relieving factors: tylenol   PRECAUTIONS: Fall  PATIENT GOALS: to get back straight  OBJECTIVE:  Note: Objective measures were completed at Evaluation unless otherwise noted.  DIAGNOSTIC FINDINGS: CTA head:   1. No proximal intracranial large vessel occlusion or proximal high-grade arterial stenosis identified. 2. Atherosclerotic plaque within the intracranial internal carotid arteries with no more than mild stenosis. 3. 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 4. 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  CTA neck:   1. The common carotid and internal carotid arteries are patent within the neck without hemodynamically significant stenosis (50% or greater). Atherosclerotic plaque about both carotid bifurcations (right greater than left). 2. 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 3. The vertebral arteries are patent within the neck without stenosis or significant atherosclerotic disease. No evidence of dissection. 4. 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  IMPRESSION: Non-contrast head CT:   1.  No evidence of an acute intracranial abnormality. 2. Mild chronic small vessel ischemic changes within the cerebral white matter. 3. Mild generalized cerebral atrophy. 4. Paranasal sinus disease as  described.  VESTIBULAR ASSESSMENT:  OCULOMOTOR EXAM:  Ocular Alignment: normal  Ocular ROM: No Limitations  Spontaneous Nystagmus: absent  Gaze-Induced Nystagmus: absent  Smooth Pursuits: saccades in vertical plane  Saccades: extra eye movements  Convergence/Divergence: ~6 cm    VESTIBULAR - OCULAR REFLEX:   Slow VOR: Positive Bilaterally  VOR Cancellation: Unable to Maintain Gaze  Head-Impulse Test: HIT Right: positive HIT Left: positive   POSITIONAL TESTING: Right Roll Test: no nystagmus Left Roll Test: no nystagmus Right Sidelying: no nystagmus Left Sidelying: no nystagmus  MOTION SENSITIVITY:  Motion Sensitivity Quotient Intensity: 0 = none, 1 = Lightheaded, 2 = Mild, 3 = Moderate, 4 = Severe, 5 = Vomiting  Intensity  1. Sitting to supine 0  2. Supine to L side 0  3. Supine to R side 1  4. Supine to sitting 1  5. L Hallpike-Dix   6. Up from L    7. R Hallpike-Dix   8. Up from R    9. Sitting, head tipped to L knee 5 (not throwing up)  10. Head up from L knee 5 (not throwing up)   11. Sitting, head tipped to R knee 5 (not throwing up)  12. Head up from R knee 5 (not throwing up)  13. Sitting head turns x5 5 (not throwing up)  14.Sitting head nods x5 0   15. In stance, 180 turn to L    16. In stance, 180 turn to R      VESTIBULAR TREATMENT:                                                                                                   Patient education: PT POC, exam findings, peripheral vs central vertigo  PATIENT EDUCATION: Education details: PT POC, exam findings, f/u with Dr Person educated: Patient Education method: Chief Technology Officer Education comprehension: verbalized understanding and needs further education  HOME EXERCISE PROGRAM: Sit to Side-Lying    Sit on edge of bed. 1. Turn head 45 to right. 2. Maintain head position and lie down slowly on left side. Hold until symptoms subside. 3. Sit up slowly. Hold until symptoms subside.  4. Turn head 45 to left. 5. Maintain head position and lie down slowly on right side. Hold until symptoms subside. 6. Sit up slowly. Repeat sequence __4__ times per session. Do __3__ sessions per day.   GOALS: Goals reviewed with patient? Yes  SHORT TERM GOALS: = LTG based on PT POC length   LONG TERM GOALS: Target date: 01/15/24  Pt will be independent with final HEP for improved symptoms Baseline: to be provided Goal status: INITIAL  2.  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  Patient will score >/= 56 on FOTO to demonstrate improved symptoms Baseline: 39 Goal status: INITIAL    ASSESSMENT:  CLINICAL IMPRESSION: Patient seen for skilled PT session with emphasis on re-evaluating positional vertigo and patient education. Patient with clear positional testing today. She does continue to present with an abundance of central findings, including impaired VOR, VOR cancellation, B positive HIT and profound motion sensitivity not explained by positional testing. She has a known pseudoaneurysm and would benefit from further medical work up before continuing with PT. Patient verbalized understanding.   OBJECTIVE IMPAIRMENTS: Abnormal gait, decreased balance, decreased knowledge of condition, and dizziness.   ACTIVITY LIMITATIONS: carrying, lifting, bed mobility, locomotion level, and caring for others  PARTICIPATION LIMITATIONS: meal prep, cleaning, driving, shopping, and community activity  PERSONAL FACTORS: Age, Past/current experiences, Sex, Time since onset of injury/illness/exacerbation, and 3+ comorbidities: see above   are also affecting patient's functional outcome.   REHAB POTENTIAL: Fair time since onset, etiology   CLINICAL DECISION MAKING: Evolving/moderate complexity  EVALUATION COMPLEXITY: Moderate   PLAN:  PT FREQUENCY: 2x/week  PT DURATION: 4  weeks  PLANNED INTERVENTIONS: 97164- PT Re-evaluation, 97110-Therapeutic exercises, 97530- Therapeutic activity, 97112- Neuromuscular re-education, 97535- Self Care, 02859- Manual therapy, [phone]- Gait training, [phone]- Canalith repositioning, Patient/Family education, Balance training, Stair training, Vestibular training, Visual/preceptual remediation/compensation, and DME instructions  PLAN FOR NEXT SESSION: reassess positional, maybe even VOR again?, results from dr?   Delon DELENA Pop, PT Delon DELENA Pop, PT, DPT, CBIS  12/29/2023, 2:47 PM

## 2024-01-04 NOTE — Progress Notes (Signed)
 [facility] [place]  ASSESSMENT / PLAN: 83 y.o. female with right internal carotid artery irregularity identified on CT angiogram of the head neck 11/27/2023.  I do not see a vascular explanation for her vertiginous dizziness.  I encouraged her to follow-up with me in 6 to 12 months with a repeat CT angiogram to monitor this irregularity in the right internal carotid artery.  CHIEF COMPLAINT: Dizziness  HISTORY OF PRESENT ILLNESS: Heidi Wolf is a 83 y.o. female referred to clinic for evaluation of irregularity in the right internal carotid artery demonstrated on CT angiogram from 11/27/2023.  This was done in the workup for dizziness.  The patient describes a fairly classic history of vertiginous dizziness.  She is initiated physical therapy for the same, but was referred for further evaluation after the therapist reviewed the patient's workup to date.  The radiologist reading the scan on 11/27/2023 was concerned for pseudoaneurysm.  Dr. 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  On my review, I see a subtle irregularity in the contour of the internal carotid artery which could be fibromuscular dysplasia or a twist/kink.  I do not see any vascular cause for vertiginous dizziness.  I reviewed this with the patient in detail.  Past Medical History:  Diagnosis Date   Allergy    Anemia    Anxiety    Arthritis    right hip (09/21/2014)   CAD (coronary artery disease)    PCI 90% diagonal stenosis witha DES July 2017.  Nonobstructive disease elsewhere.    Cataracts, both eyes    Colon polyps    Hyperplastic 2003   External hemorrhoid    GERD (gastroesophageal reflux disease)    Glaucoma    Hyperlipidemia    Hypertension    Internal hemorrhoid    Internal hemorrhoids without mention of complication 03/03/2012   Knee effusion, right 03/05/2020   Myocardial infarction [facility])     Palpitations 09/03/2016   Holter Aug 2017- PACs, no arrhythmia.    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   Severe Lumbar spinal stenosis 09/19/2022   Stenosing tenosynovitis of finger of left hand 05/06/2021   Trigger finger, acquired 05/06/2021   Trigger finger, left 03/08/2021   Type II diabetes mellitus (HCC)    type 2   Whiplash injury, acute, sequela 03/05/2020    Past Surgical History:  Procedure Laterality Date   ABDOMINAL HYSTERECTOMY     APPENDECTOMY     w/gallbladder   CARDIAC CATHETERIZATION  X 2   CARDIAC CATHETERIZATION N/A 07/03/2016   Procedure: Left Heart Cath and Coronary Angiography;  Surgeon: Salena Negri, MD;  Location: [facility] INVASIVE CV LAB;  Service: Cardiovascular;  Laterality: N/A;   CARDIAC CATHETERIZATION N/A 07/03/2016   Procedure: Coronary Stent Intervention;  Surgeon: Candyce GORMAN Reek, MD;  Location: [facility] INVASIVE CV LAB;  Service: Cardiovascular;  Laterality: N/A;   CARDIAC CATHETERIZATION N/A 07/15/2016   Procedure: Left Heart Cath and Coronary Angiography;  Surgeon: Salena Negri, MD;  Location: [facility] INVASIVE CV LAB;  Service: Cardiovascular;  Laterality: N/A;   CHOLECYSTECTOMY     COLONOSCOPY     CORONARY ANGIOPLASTY WITH STENT PLACEMENT  06/2016   TUBAL LIGATION  1971    Family History  Problem Relation Age of Onset   Diabetes Mother    Arthritis Mother    CAD Mother 24  CABG   CAD Brother    CAD Sister 60       CABG   Stroke Brother    Stroke Daughter    Diabetes Maternal Grandmother    Stroke Maternal Grandmother    Stroke Father    Heart failure Sister    Kidney failure Sister    Breast cancer Other    Colon cancer Neg Hx    Stomach cancer Neg Hx    Esophageal cancer Neg Hx    Pancreatic cancer Neg Hx    Rectal cancer Neg Hx     Social History   Socioeconomic History   Marital status: Legally Separated    Spouse name: Not on file   Number of children: 3   Years of education: Not on file    Highest education level: Not on file  Occupational History   Occupation: retired  Tobacco Use   Smoking status: Never    Passive exposure: Never   Smokeless tobacco: Never  Vaping Use   Vaping status: Never Used  Substance and Sexual Activity   Alcohol use: No   Drug use: No   Sexual activity: Never  Other Topics Concern   Not on file  Social History Narrative   LIves with Tammy   Social Drivers of Health   Financial Resource Strain: Low Risk  (08/17/2023)   Overall Financial Resource Strain (CARDIA)    Difficulty of Paying Living Expenses: Not hard at all  Food Insecurity: No Food Insecurity (08/17/2023)   Hunger Vital Sign    Worried About Running Out of Food in the Last Year: Never true    Ran Out of Food in the Last Year: Never true  Transportation Needs: No Transportation Needs (08/17/2023)   PRAPARE - Administrator, Civil Service (Medical): No    Lack of Transportation (Non-Medical): No  Physical Activity: Inactive (08/17/2023)   Exercise Vital Sign    Days of Exercise per Week: 0 days    Minutes of Exercise per Session: 0 min  Stress: No Stress Concern Present (08/17/2023)   [employer] of Occupational Health - Occupational Stress Questionnaire    Feeling of Stress : Not at all  Social Connections: Moderately Isolated (08/17/2023)   Social Connection and Isolation Panel [NHANES]    Frequency of Communication with Friends and Family: More than three times a week    Frequency of Social Gatherings with Friends and Family: Three times a week    Attends Religious Services: 1 to 4 times per year    Active Member of Clubs or Organizations: No    Attends Banker Meetings: Never    Marital Status: Widowed  Intimate Partner Violence: Not At Risk (08/17/2023)   Humiliation, Afraid, Rape, and Kick questionnaire    Fear of Current or Ex-Partner: No    Emotionally Abused: No    Physically Abused: No    Sexually Abused: No    Allergies  Allergen  Reactions   Aspirin  Other (See Comments)    Makes stomach burn    Current Outpatient Medications  Medication Sig Dispense Refill   ACCU-CHEK AVIVA PLUS test strip USE TO CHECK BLOOD SUGAR UP TO ONCE DAILY AS NEEDED 100 strip 3   Accu-Chek Softclix Lancets lancets Please use to check blood sugar once daily. 100 each 3   amLODipine  (NORVASC ) 10 MG tablet Take 1 tablet (10 mg total) by mouth daily. (Patient taking differently: Take 10 mg by mouth in the morning.) 90 tablet  3   aspirin  EC 81 MG tablet Take 1 tablet (81 mg total) by mouth daily. (Patient taking differently: Take 81 mg by mouth in the morning.)     benazepril  (LOTENSIN ) 20 MG tablet Take 1 tablet (20 mg total) by mouth daily. (Patient taking differently: Take 20 mg by mouth in the morning.) 90 tablet 3   CHOLECALCIFEROL  PO Take 1,000 Units by mouth in the morning.     Dihydroxyaluminum Sod Carb (ROLAIDS PO) Take 2 each by mouth daily as needed (heartburn).     famotidine  (PEPCID ) 10 MG tablet Take 1 tablet (10 mg total) by mouth 2 (two) times daily. 180 tablet 0   gabapentin  (NEURONTIN ) 100 MG capsule TAKE 1 CAPSULE(100 MG) BY MOUTH TWICE DAILY AS NEEDED 180 capsule 0   loratadine  (CLARITIN ) 10 MG tablet Take 1 tablet (10 mg total) by mouth daily. 30 tablet 0   LUMIGAN  0.01 % SOLN Place 1 drop into both eyes at bedtime.      nitroGLYCERIN  (NITROSTAT ) 0.4 MG SL tablet PLACE 1 TABLET UNDER THE TONGUE EVERY 5 MINUTES AS NEEDED FOR CHEST PAIN (Patient taking differently: Place 0.4 mg under the tongue every 5 (five) minutes as needed for chest pain.) 25 tablet 3   polyethylene glycol powder (GLYCOLAX /MIRALAX ) 17 GM/SCOOP powder Take 17 g by mouth daily as needed. 289 g 1   rosuvastatin  (CRESTOR ) 40 MG tablet Take 1 tablet (40 mg total) by mouth daily. (Patient taking differently: Take 40 mg by mouth every evening.) 90 tablet 3   tolnaftate (TINACTIN) 1 % external solution Apply 1 Application topically at bedtime. Absorbine Jr      trolamine salicylate (ASPERCREME) 10 % cream Apply 1 application topically as needed for muscle pain. (Patient not taking: Reported on 12/16/2023)     Current Facility-Administered Medications  Medication Dose Route Frequency Provider Last Rate Last Admin   aspirin  tablet 325 mg  325 mg Oral Daily Nicholas Bar, MD   325 mg at 11/23/23 1147    PHYSICAL EXAM Vitals:   01/05/24 1306  BP: 137/78  Pulse: 71  Resp: 20  Temp: 97.9 F (36.6 C)  SpO2: 96%  Weight: 162 lb (73.5 kg)  Height: 5' 4 (1.626 m)    Elderly woman in no distress Regular rate and rhythm Unlabored breathing Normal gait and station Radial pulses palpable  PERTINENT LABORATORY AND RADIOLOGIC DATA  Most recent CBC    Latest Ref Rng & Units 12/12/2023   11:19 AM 11/27/2023   11:21 AM 11/23/2023   12:36 PM  CBC  WBC 4.0 - 10.5 K/uL 8.9  8.0  8.7   Hemoglobin 12.0 - 15.0 g/dL 85.8  85.3  85.5   Hematocrit 36.0 - 46.0 % 44.0  45.3  44.9   Platelets 150 - 400 K/uL 311  280  266      Most recent CMP    Latest Ref Rng & Units 12/12/2023   11:19 AM 11/27/2023   11:21 AM 11/23/2023   12:36 PM  CMP  Glucose 70 - 99 mg/dL 857  885  897   BUN 8 - 23 mg/dL 16  16  15    Creatinine 0.44 - 1.00 mg/dL 8.85  8.89  8.98   Sodium 135 - 145 mmol/L 138  141  137   Potassium 3.5 - 5.1 mmol/L 3.6  3.6  4.0   Chloride 98 - 111 mmol/L 106  108  105   CO2 22 - 32 mmol/L 22  23  25   Calcium  8.9 - 10.3 mg/dL 9.3  9.3  9.5     Renal function CrCl cannot be calculated (Patient's most recent lab result is older than the maximum 21 days allowed.).  HbA1c, POC (prediabetic range) (%)  Date Value  09/19/2022 6.2   HbA1c, POC (controlled diabetic range) (%)  Date Value  07/08/2023 6.1    LDL Chol Calc (NIH)  Date Value Ref Range Status  04/01/2023 114 (H) 0 - 99 mg/dL Final    CT angiogram personally reviewed. There is an irregularity in the internal carotid artery on the right side.  This is very subtle.  This  could be pseudoaneurysm, but I feel that unlikely given the clinical history.  Fibromuscular dysplasia or twist/kink is much more likely.  I do not see a vascular explanation for her dizziness.  Debby SAILOR. Magda, MD FACS [facility] Phone Number: [phone] 01/04/2024 10:14 AM   Total time spent on preparing this encounter including chart review, data review, collecting history, examining the patient, coordinating care for this new patient, 60 minutes.  Portions of this report may have been transcribed using voice recognition software.  Every effort has been made to ensure accuracy; however, inadvertent computerized transcription errors may still be present.

## 2024-01-05 ENCOUNTER — Ambulatory Visit (INDEPENDENT_AMBULATORY_CARE_PROVIDER_SITE_OTHER): Payer: Medicare Other | Admitting: Vascular Surgery

## 2024-01-05 ENCOUNTER — Encounter: Payer: Self-pay | Admitting: Vascular Surgery

## 2024-01-05 VITALS — BP 137/78 | HR 71 | Temp 97.9°F | Resp 20 | Ht 64.0 in | Wt 162.0 lb

## 2024-01-05 DIAGNOSIS — R42 Dizziness and giddiness: Secondary | ICD-10-CM

## 2024-01-05 DIAGNOSIS — M7989 Other specified soft tissue disorders: Secondary | ICD-10-CM

## 2024-01-07 DIAGNOSIS — I517 Cardiomegaly: Secondary | ICD-10-CM | POA: Insufficient documentation

## 2024-01-07 NOTE — Progress Notes (Signed)
 Cardiology Office Note:   Date:  01/08/2024  ID:  Heidi Wolf, DOB 26-Mar-1941, MRN [id number] PCP: Jennelle Riis, MD  [facility] Providers Cardiologist:  Lynwood Schilling, MD {  History of Present Illness:   Heidi Wolf is a 83 y.o. female who presents for follow up of CAD s/p Dx PCI/ DES 07/03/16. Re look 07/15/16 showed patent stent. She had no other significant CAD and LV wall motion was normal at cath. She complained of intermittent palpitations. A Holter monitor showed PACs and her beta blocker was increased.  Since I last saw her she was in the hospital with chest pain.  She had negative cardiac enzymes and her echo showed no significant wall motion abnormalities.  She did have a suggestion of some hypertrophy of the apical segment.   She had an echo with evidence of apical hypertrophy.  This was unchanged from previous.     Since I last saw her she was I the hospital with  chest pain.  Perfusion study was ordered.  She did have a follow up echo post discharge with apical hypertrophy.  Of note she was taken off the beta-blocker at that point in time because of low heart rates.  She was set up to have a PET scan which still has not been completed although it is scheduled.  She was back in the emergency room in December for chest pain and I reviewed these records.  There was no objective evidence of ischemia.  She says she has taken a couple of nitroglycerin  since then.  She has lots of somatic complaints including back pain, vertigo, leg pain.  She saw [facility] and for dizziness but there was not thought to be vascular etiology.  She is seeing physical therapy for possible vertigo.  She continues to get sporadic chest discomfort that has nonanginal greater than anginal features.  She is not having any new shortness of breath, PND or orthopnea.  She not having any new palpitations, presyncope or syncope.     ROS: As stated in the HPI and negative for all other systems.  Studies  Reviewed:    EKG:   EKG Interpretation Date/Time:  Friday January 08 2024 09:57:23 EST Ventricular Rate:  66 PR Interval:  160 QRS Duration:  98 QT Interval:  400 QTC Calculation: 419 R Axis:   63  Text Interpretation: Normal sinus rhythm LVH with repolarization When compared with ECG of 12-Dec-2023 20:07, No significant change since last tracing Confirmed by Schilling Lynwood ([id number]) on 01/08/2024 10:40:35 AM    Risk Assessment/Calculations:       Physical Exam:   VS:  BP (!) 148/66   Pulse 68   Ht 5' 4 (1.626 m)   Wt 163 lb 6.4 oz (74.1 kg)   SpO2 98%   BMI 28.05 kg/m    Wt Readings from Last 3 Encounters:  01/08/24 163 lb 6.4 oz (74.1 kg)  01/05/24 162 lb (73.5 kg)  12/12/23 164 lb (74.4 kg)     GEN: Well nourished, well developed in no acute distress NECK: No JVD; No carotid bruits CARDIAC: RRR, apical systolic murmur radiating to the axilla, no diastolic murmurs, rubs, gallops RESPIRATORY:  Clear to auscultation without rales, wheezing or rhonchi  ABDOMEN: Soft, non-tender, non-distended EXTREMITIES:  No edema; No deformity   ASSESSMENT AND PLAN:   CAD:   The patient has some anginal symptoms and again and Imdur  30 mg daily.  She was on this in the distant past but  not seeing why it was discontinued.  She is also going to be able to continue to take her sublingual and still follow-up with the PET scan that is scheduled.    ABNORMAL ECHO:   she has apical hypertrophy.  She has not had any high risk findings however.  I do not think further cardiovascular testing is suggested and I do not think she is at high risk for arrhythmias.  She has no obstruction to outflow and no symptoms related to this.      HTN:   The blood pressure is mildly elevated.  I will see this comes down with the Imdur .    DM: A1c was 6.1 which is down from 6.3.     HYPERLIPIDEMIA:     LDL was 114.  We will reinforce to make sure she is taking her rosuvastatin  and when she comes back to see the  APP I like her to be fasting and get a lipid profile.  Previously her LDL is been well-controlled on Crestor .       Follow up with APP in 3 months.   Signed, Lynwood Schilling, MD

## 2024-01-08 ENCOUNTER — Ambulatory Visit: Payer: Medicare Other | Attending: Cardiology | Admitting: Cardiology

## 2024-01-08 ENCOUNTER — Encounter: Payer: Self-pay | Admitting: Cardiology

## 2024-01-08 VITALS — BP 148/66 | HR 68 | Ht 64.0 in | Wt 163.4 lb

## 2024-01-08 DIAGNOSIS — I10 Essential (primary) hypertension: Secondary | ICD-10-CM | POA: Diagnosis not present

## 2024-01-08 DIAGNOSIS — E785 Hyperlipidemia, unspecified: Secondary | ICD-10-CM | POA: Diagnosis not present

## 2024-01-08 DIAGNOSIS — I2510 Atherosclerotic heart disease of native coronary artery without angina pectoris: Secondary | ICD-10-CM | POA: Diagnosis not present

## 2024-01-08 DIAGNOSIS — I517 Cardiomegaly: Secondary | ICD-10-CM | POA: Diagnosis not present

## 2024-01-08 DIAGNOSIS — R079 Chest pain, unspecified: Secondary | ICD-10-CM | POA: Insufficient documentation

## 2024-01-08 DIAGNOSIS — E118 Type 2 diabetes mellitus with unspecified complications: Secondary | ICD-10-CM | POA: Insufficient documentation

## 2024-01-08 MED ORDER — NITROGLYCERIN 0.4 MG SL SUBL: 0.4000 mg | SUBLINGUAL_TABLET | SUBLINGUAL | 11 refills | Status: AC | PRN

## 2024-01-08 MED ORDER — AMLODIPINE BESYLATE 10 MG PO TABS: 10.0000 mg | ORAL_TABLET | Freq: Every morning | ORAL | 3 refills | Status: DC

## 2024-01-08 MED ORDER — ISOSORBIDE MONONITRATE ER 30 MG PO TB24: 30.0000 mg | ORAL_TABLET | Freq: Every day | ORAL | 3 refills | Status: DC

## 2024-01-08 MED ORDER — ROSUVASTATIN CALCIUM 40 MG PO TABS: 40.0000 mg | ORAL_TABLET | Freq: Every evening | ORAL | 3 refills | Status: AC

## 2024-01-08 MED ORDER — BENAZEPRIL HCL 20 MG PO TABS: 20.0000 mg | ORAL_TABLET | Freq: Every morning | ORAL | 3 refills | Status: DC

## 2024-01-08 NOTE — Patient Instructions (Signed)
 Medication Instructions:  Start isosorbide  (imdur ) 30 mg by mouth daily. New scripts sent for refills as well. Make sure you are taking your rosuvastatin . *If you need a refill on your cardiac medications before your next appointment, please call your pharmacy*   Lab Work: Fasting Lipid profile in 3 months prior to follow up appointment. If you have labs (blood work) drawn today and your tests are completely normal, you will receive your results only by: MyChart Message (if you have MyChart) OR A paper copy in the mail If you have any lab test that is abnormal or we need to change your treatment, we will call you to review the results.   Follow-Up: At [facility], you and your health needs are our priority.  As part of our continuing mission to provide you with exceptional heart care, we have created designated Provider Care Teams.  These Care Teams include your primary Cardiologist (physician) and Advanced Practice Providers (APPs -  Physician Assistants and Nurse Practitioners) who all work together to provide you with the care you need, when you need it.  We recommend signing up for the patient portal called MyChart.  Sign up information is provided on this After Visit Summary.  MyChart is used to connect with patients for Virtual Visits (Telemedicine).  Patients are able to view lab/test results, encounter notes, upcoming appointments, etc.  Non-urgent messages can be sent to your provider as well.   To learn more about what you can do with MyChart, go to [email or web address].    Your next appointment:   3 month(s)  Provider:    First available APP.

## 2024-01-13 ENCOUNTER — Encounter: Payer: Medicare Other | Admitting: Vascular Surgery

## 2024-01-13 NOTE — Therapy (Signed)
 [facility] [address] [place], [place], [address] Phone: [phone]   Fax:  [phone]  Patient Details  Name: Heidi Wolf MRN: [id number] Date of Birth: 02-04-1941 Referring Provider:  No ref. provider found  Encounter Date: 01/13/2024  PHYSICAL THERAPY DISCHARGE SUMMARY  Visits from Start of Care: 2  Current functional level related to goals / functional outcomes: Patient has not returned since last visit   Remaining deficits: Patient has not returned since last visit    Education / Equipment: PT POC   Patient agrees to discharge. Patient goals were  unable to be assessed as patient has not returned since last visit . Patient is being discharged due to not returning since the last visit.   Rebecca Campus, PT Rebecca Campus, PT, DPT, CBIS  01/13/2024, 9:00 AM  [facility] [address] [place], [place], [address] Phone: [phone]   Fax:  [phone]

## 2024-01-14 ENCOUNTER — Ambulatory Visit: Payer: Medicare Other | Admitting: Physical Therapy

## 2024-01-15 ENCOUNTER — Encounter: Payer: Self-pay | Admitting: Student

## 2024-01-15 ENCOUNTER — Ambulatory Visit (INDEPENDENT_AMBULATORY_CARE_PROVIDER_SITE_OTHER): Payer: Medicare Other | Admitting: Student

## 2024-01-15 VITALS — BP 142/70 | HR 72 | Ht 64.0 in | Wt 163.2 lb

## 2024-01-15 DIAGNOSIS — M48062 Spinal stenosis, lumbar region with neurogenic claudication: Secondary | ICD-10-CM | POA: Diagnosis not present

## 2024-01-15 DIAGNOSIS — R002 Palpitations: Secondary | ICD-10-CM | POA: Diagnosis not present

## 2024-01-15 NOTE — Patient Instructions (Signed)
It was great to see you! Thank you for allowing me to participate in your care!  I recommend that you always bring your medications to each appointment as this makes it easy to ensure we are on the correct medications and helps [place] not miss when refills are needed.  Our plans for today:  - Palpitations Keep a record of when this happens, what your doing, how long it last. If you start to have more episodes before you next cardiology appointment, you'll want to get in sooner to be seen.   We are going to check some blood work today!  - Back Pain / leg weakness This is likely coming from your spinal stenosis. You are fine to restart your gabapentin. Try one dose in the morning to see how you tolerate it. If you get too tired, sleepy, weak, confused, stop the medicine and let me know.   *If tolerating the medicine well, you are fine to use twice a day  - Knee Pain / Leg Swelling  Make a follow up appointment for knee injections / and Follow up of the leg swelling  We are checking some labs today, I will call you if they are abnormal will send you a MyChart message or a letter if they are normal.  If you do not hear about your labs in the next 2 weeks please let us know.  Take care and seek immediate care sooner if you develop any concerns.   Dr. [name], MD [facility]

## 2024-01-15 NOTE — Progress Notes (Signed)
SUBJECTIVE:   CHIEF COMPLAINT / HPI:   Tremors Feels tremors in chest, stomach, and arm at times. Happens like she was startled and she kind jumps up. Feels like a fluttering sensation in chest. After waking up, feels like heart is going fast. Sometimes feels a little dyspneic with the sensation, and has to go sit down. Feels the movement in chest, stomach and left arm. Happens every day, and wakes her up at night, when she has it. Taking imdur regularly, hasn't needed nitro. Denies any syncope/presyncope. Has palpitations when laying on right side, but slows when laying on back.    Pain in back  that goes down to legs, can be both legs, and hurts her calf. Got better with time. Had seen provider for her back injections. But will no longer can get them, as they aren't helping. Happens daily, and is worse with activity. Used to take gabapentin, but has been avoided it since starting new meds.    Rt leg dragging Happens on and off, usually worse in the day. No tripping or catching feet. Has been an issue since 04/09/2023 when she was getting injections in her back.   Bilateral leg swelling Wearing Compression leggings  Hearing off  PERTINENT  PMH / PSH:    OBJECTIVE:  There were no vitals taken for this visit. Physical Exam Constitutional:      General: She is not in acute distress.    Appearance: Normal appearance. She is normal weight. She is not ill-appearing.  Musculoskeletal:     Right lower leg: Normal. No swelling, deformity, lacerations, tenderness or bony tenderness. No edema.     Left lower leg: No swelling. No edema.  Neurological:     Mental Status: She is alert.     Cranial Nerves: Cranial nerves 2-12 are intact. No cranial nerve deficit, dysarthria or facial asymmetry.     Sensory: Sensation is intact. No sensory deficit.     Motor: Motor function is intact. No weakness, tremor, atrophy or abnormal muscle tone.     Coordination: Finger-Nose-Finger Test and Heel to  [place] Test normal.     Gait: Gait is intact.      ASSESSMENT/PLAN:   Assessment & Plan Palpitations Patient comes in with complaint of, what appears to be palpitations, that happen daily, multiple times a day.  Patient feels sensation in chest, belly, sometimes left shoulder.  Patient is followed up with cardiology, and has plans for a PET scan early February.  Will check lab work to see if there is a secondary cause of these palpitations, although suspect patient will need to follow-up with cardiology, after advanced imaging/testing is completed.  Will also have patient keep a log of symptoms, if symptoms increasing or getting worse, will recommend patient follow-up with cardiology sooner.  Patient had recent CBC and BMP, that were normal, last month. - TSH - Log book of symptoms - Go to cardiology testing/imaging in February, follow-up with cardiology if symptoms increase Spinal stenosis of lumbar region with neurogenic claudication Patient appreciates pain in her lower back, that spreads down to her right leg, daily, worse at the end of the day.  Patient also appreciates she has a sensation of leg cramping, in her calf muscles, at times.  Suspect this is related to her spinal stenosis, as pain is well treated with gabapentin.  Patient appreciates that she has not been taking gabapentin, as she started new medications, (Imdur), was concerned about effects of medicine.  Provided reassurance patient okay  to try gabapentin, and to discontinue if having symptoms of weakness, tired, disorientation, confusion, or balance issues. -Restart gabapentin 100 mg twice daily as needed No follow-ups on file. [name], MD 01/15/2024, 8:00 AM PGY-3, [facility]

## 2024-01-15 NOTE — Assessment & Plan Note (Addendum)
Patient appreciates pain in her lower back, that spreads down to her right leg, daily, worse at the end of the day.  Patient also appreciates she has a sensation of leg cramping, in her calf muscles, at times.  Suspect this is related to her spinal stenosis, as pain is well treated with gabapentin.  Patient appreciates that she has not been taking gabapentin, as she started new medications, (Imdur), was concerned about effects of medicine.  Provided reassurance patient okay to try gabapentin, and to discontinue if having symptoms of weakness, tired, disorientation, confusion, or balance issues. -Restart gabapentin 100 mg twice daily as needed

## 2024-01-15 NOTE — Assessment & Plan Note (Addendum)
Patient comes in with complaint of, what appears to be palpitations, that happen daily, multiple times a day.  Patient feels sensation in chest, belly, sometimes left shoulder.  Patient is followed up with cardiology, and has plans for a PET scan early February.  Will check lab work to see if there is a secondary cause of these palpitations, although suspect patient will need to follow-up with cardiology, after advanced imaging/testing is completed.  Will also have patient keep a log of symptoms, if symptoms increasing or getting worse, will recommend patient follow-up with cardiology sooner.  Patient had recent CBC and BMP, that were normal, last month. - TSH - Log book of symptoms - Go to cardiology testing/imaging in February, follow-up with cardiology if symptoms increase

## 2024-01-16 LAB — TSH RFX ON ABNORMAL TO FREE T4: TSH: 0.644 u[IU]/mL (ref 0.450–4.500)

## 2024-01-18 ENCOUNTER — Telehealth: Payer: Self-pay | Admitting: Cardiology

## 2024-01-18 MED ORDER — ISOSORBIDE MONONITRATE ER 60 MG PO TB24: 60.0000 mg | ORAL_TABLET | Freq: Every day | ORAL | 3 refills | Status: DC

## 2024-01-18 NOTE — Telephone Encounter (Signed)
Patient c/o Palpitations:  High priority if patient c/o lightheadedness, shortness of breath, or chest pain  How long have you had palpitations/irregular HR/ Afib? Are you having the symptoms now? Beginning of last week. Saw PCP on Fri 01/17 who told her to continue taking meds as prescribed    Are you currently experiencing lightheadedness, SOB or CP? Tightness   Do you have a history of afib (atrial fibrillation) or irregular heart rhythm? Yes   Have you checked your BP or HR? (document readings if available): N/A   Are you experiencing any other symptoms? Patient states that it feels like a bump in chest occasionally and would like to speak to someone in regards to this.

## 2024-01-18 NOTE — Telephone Encounter (Signed)
Patient identification verified by 2 forms. Marilynn Rail, RN    Called and spoke to patient  Patient states:   -has been having irregular heart rate on and off for a while   -Saw Dr. Antoine Poche on 1/10, saw PCP on 1/17 discussed symptoms at both appointment  -PCP advised her to follow up up cardiology is palpitation do not improve   -has a bit of chest tightness   -sometimes palpitation wake her up in the middle of the night   -palpitation feel like a jumping feeling in the chest, feels like a strong feeling   -palpitation occur throughout the day and at night but are not constant    -Heart rate at time of call: 98  -BP at time of call: 173/89   -today she has only taken Imdur 30mg    -plans to take amlodipine, benazepril with breakfast later this morning   Patient denies:   -chest pain   -SOB/difficulty breathing  Informed patient message sent to Dr. Antoine Poche for input/advisement  Reviewed ED warning signs/precautions  Patient verbalized understanding, no questions at this time

## 2024-01-18 NOTE — Telephone Encounter (Signed)
Patient identification verified by 2 forms. Marilynn Rail, RN    Called and spoke to patient  Relayed provider message below  Patient states:   -BP 157/79 HR 79, after taking medications  Advised patient:   -Can start taking 2 of the 30mg  Imdur   -New Rx sent for 60mg  tablet imdur, only take one table once picked up   -check BP 1 hour after take medications   -continue to monitor palpitations   -keep 2/5 appointment for PET   -call if no improvements in symptoms  Patient verbalized understanding, no questions at this time

## 2024-01-18 NOTE — Telephone Encounter (Signed)
Heidi Rotunda, MD  [employer]Just now (4:13 PM)    If she has started the Imdur at 30 mg she can increase to 2 pills once daily (total 60 mg) and she will need a new prescription.  She needs to complete her PET study

## 2024-01-19 ENCOUNTER — Encounter: Payer: Medicare Other | Admitting: Physical Therapy

## 2024-01-20 ENCOUNTER — Encounter: Payer: Self-pay | Admitting: Student

## 2024-01-20 ENCOUNTER — Ambulatory Visit: Payer: Medicare Other | Admitting: Student

## 2024-01-28 ENCOUNTER — Ambulatory Visit: Payer: Medicare Other | Admitting: Student

## 2024-01-28 ENCOUNTER — Encounter: Payer: Self-pay | Admitting: Student

## 2024-01-28 VITALS — BP 127/63 | HR 63 | Ht 64.0 in | Wt 161.8 lb

## 2024-01-28 DIAGNOSIS — Z23 Encounter for immunization: Secondary | ICD-10-CM

## 2024-01-28 DIAGNOSIS — R002 Palpitations: Secondary | ICD-10-CM | POA: Diagnosis not present

## 2024-01-28 DIAGNOSIS — M1711 Unilateral primary osteoarthritis, right knee: Secondary | ICD-10-CM

## 2024-01-28 MED ORDER — METHYLPREDNISOLONE ACETATE 40 MG/ML IJ SUSP
40.0000 mg | Freq: Once | INTRAMUSCULAR | Status: AC
  Administered 2024-01-28: 40 mg via INTRAMUSCULAR

## 2024-01-28 NOTE — Assessment & Plan Note (Addendum)
Patient comes in with continued knee pain and right knee.  Patient appreciates also having swelling at times.  Patient requesting steroid injection. Written and verbal consent was obtained after discussing the risks and benefits of the procedure with the patient. A timeout was performed and the correct site and side was identified and confirmed.  The was cleaned in sterile fashion betadine and alcohol pad. 40 mg Depo-medrol and 4 cc 1% Lidocaine was injected using a anterior approach using a 5 cc syringe and 21 gauge 1 and 1/2 in needle. No complications were encountered. Minimal blood loss. A band aid was applied.  -Steroid injection x 1

## 2024-01-28 NOTE — Patient Instructions (Signed)
It was great to see you! Thank you for allowing me to participate in your care!  I recommend that you always bring your medications to each appointment as this makes it easy to ensure we are on the correct medications and helps [place] not miss when refills are needed.  Our plans for today:  - Knee Injection  This should help with your knee pain. You can get these every 3 months as needed.  We gave you an injection of a steroid and pain control medicine  - Palpitations  This seems to be related to your heart. We will wait to see what the results of the pet scan show.   Take care and seek immediate care sooner if you develop any concerns.   Dr. [name], MD [facility]

## 2024-01-28 NOTE — Assessment & Plan Note (Addendum)
Patient continues to have palpitations daily, multiple times a day, that come 2-4 hits in a row, that are very striking/startling.  Patient denies any chest pain or shortness of breath.  Patient has reached out to cardiology, they are aware, increased her Imdur to 60 mg, no improvement in symptoms.  Cardiology wanting patient to get PET cardiac scan to help determine cause of these episodes.  Patient plans to receive imaging. - Follow-up with cardiac PET scan - Follow-up with cardiology - Continue to log symptoms

## 2024-01-28 NOTE — Progress Notes (Signed)
  SUBJECTIVE:   CHIEF COMPLAINT / HPI:   F/u Knee pain and leg swelling -Requesting knee injections -Right knee OA 11/07/21 Patient continues to have knee pain, and swelling in the knee at times.  Patient requesting knee injection.  Patient reports she has got 1 years ago in the past that was very helpful.  Palpitations Continues to have them, heart doctor increased Imdur dose to 60. Plan to have her get PET to see what is cause. Denies any CP or SOB w/ symptoms, but feels like jumping sensation in chest that can happen 2-4 times in a row.    PERTINENT  PMH / PSH:    OBJECTIVE:  BP 127/63   Pulse 63   Ht 5\' 4"  (1.626 m)   Wt 161 lb 12.8 oz (73.4 kg)   SpO2 100%   BMI 27.77 kg/m  Physical Exam Musculoskeletal:     Right knee: No swelling, deformity, effusion, erythema, ecchymosis, lacerations, bony tenderness or crepitus. Normal range of motion. Tenderness present over the medial joint line and lateral joint line. Normal alignment.      ASSESSMENT/PLAN:    Assessment & Plan Primary osteoarthritis of right knee Patient comes in with continued knee pain and right knee.  Patient appreciates also having swelling at times.  Patient requesting steroid injection. Written and verbal consent was obtained after discussing the risks and benefits of the procedure with the patient. A timeout was performed and the correct site and side was identified and confirmed.  The was cleaned in sterile fashion betadine and alcohol pad. 40 mg Depo-medrol and 4 cc 1% Lidocaine was injected using a anterior approach using a 5 cc syringe and 21 gauge 1 and 1/2 in needle. No complications were encountered. Minimal blood loss. A band aid was applied.  -Steroid injection x 1 Palpitations Patient continues to have palpitations daily, multiple times a day, that come 2-4 hits in a row, that are very striking/startling.  Patient denies any chest pain or shortness of breath.  Patient has reached out to cardiology,  they are aware, increased her Imdur to 60 mg, no improvement in symptoms.  Cardiology wanting patient to get PET cardiac scan to help determine cause of these episodes.  Patient plans to receive imaging. - Follow-up with cardiac PET scan - Follow-up with cardiology - Continue to log symptoms No follow-ups on file. [name], MD 01/28/2024, 12:38 PM PGY-3, [facility]

## 2024-01-28 NOTE — Progress Notes (Signed)
Attending Note: I have seen and examined this patient, discussed this patient with the resident and reviewed the assessment and plan as documented in the resident's note. I agree with the resident's findings and plan. I was present for the entire procedure and supervised the resident. Denny Levy MD, MA [phone] pager

## 2024-02-01 ENCOUNTER — Encounter (HOSPITAL_COMMUNITY): Payer: Self-pay

## 2024-02-03 ENCOUNTER — Ambulatory Visit (HOSPITAL_COMMUNITY)
Admission: RE | Admit: 2024-02-03 | Discharge: 2024-02-03 | Disposition: A | Discharge status: Home / Self Care | Payer: Medicare Other | Source: Ambulatory Visit | Attending: Physician Assistant | Admitting: Physician Assistant

## 2024-02-03 DIAGNOSIS — R072 Precordial pain: Secondary | ICD-10-CM | POA: Insufficient documentation

## 2024-02-03 DIAGNOSIS — Z9861 Coronary angioplasty status: Secondary | ICD-10-CM | POA: Insufficient documentation

## 2024-02-03 DIAGNOSIS — I2510 Atherosclerotic heart disease of native coronary artery without angina pectoris: Secondary | ICD-10-CM | POA: Diagnosis not present

## 2024-02-03 LAB — NM PET CT CARDIAC PERFUSION MULTI W/ABSOLUTE BLOODFLOW
LV dias vol: 96 mL (ref 46–106)
LV sys vol: 49 mL
MBFR: 1764.04
Nuc Rest EF: 49 %
Nuc Stress EF: 58 %
Rest MBF: 1.14 ml/g/min
Rest Nuclear Isotope Dose: 18.9 mCi
ST Depression (mm): 0 mm
Stress MBF: 2011 ml/g/min
Stress Nuclear Isotope Dose: 18.9 mCi

## 2024-02-03 MED ORDER — REGADENOSON 0.4 MG/5ML IV SOLN
INTRAVENOUS | Status: AC
  Filled 2024-02-03: qty 5

## 2024-02-03 MED ORDER — RUBIDIUM RB82 GENERATOR (RUBYFILL)
18.9000 | PACK | Freq: Once | INTRAVENOUS | Status: AC
  Administered 2024-02-03: 18.9 via INTRAVENOUS

## 2024-02-03 MED ORDER — RUBIDIUM RB82 GENERATOR (RUBYFILL)
18.9200 | PACK | Freq: Once | INTRAVENOUS | Status: AC
  Administered 2024-02-03: 18.92 via INTRAVENOUS

## 2024-02-03 MED ORDER — REGADENOSON 0.4 MG/5ML IV SOLN
0.4000 mg | Freq: Once | INTRAVENOUS | Status: AC
  Administered 2024-02-03: 0.4 mg via INTRAVENOUS

## 2024-02-08 DIAGNOSIS — H04123 Dry eye syndrome of bilateral lacrimal glands: Secondary | ICD-10-CM | POA: Diagnosis not present

## 2024-02-08 DIAGNOSIS — H25813 Combined forms of age-related cataract, bilateral: Secondary | ICD-10-CM | POA: Diagnosis not present

## 2024-02-08 DIAGNOSIS — H1045 Other chronic allergic conjunctivitis: Secondary | ICD-10-CM | POA: Diagnosis not present

## 2024-02-08 DIAGNOSIS — H401131 Primary open-angle glaucoma, bilateral, mild stage: Secondary | ICD-10-CM | POA: Diagnosis not present

## 2024-02-11 ENCOUNTER — Other Ambulatory Visit: Payer: Self-pay | Admitting: Student

## 2024-02-11 ENCOUNTER — Telehealth: Payer: Self-pay | Admitting: Student

## 2024-02-11 ENCOUNTER — Ambulatory Visit: Payer: Medicare Other | Attending: Family Medicine

## 2024-02-11 DIAGNOSIS — R002 Palpitations: Secondary | ICD-10-CM

## 2024-02-11 NOTE — Progress Notes (Unsigned)
EP to read.

## 2024-02-11 NOTE — Progress Notes (Signed)
Pt continues to complain of palpitations, will get zio patch

## 2024-02-11 NOTE — Telephone Encounter (Signed)
Called to let patient know that I saw the results of the PET CT scan and that the MD or RN from cards would be calling her with the results.   I'm aware that she is still having palpitations and we will have her wear a Zio Patch for 2 weeks.   Order placed, and plan for patient to place the patch on at home.   Told patient to call clinic if she'd not heard anything in the next 2 weeks, about the heart monitor.

## 2024-02-13 DIAGNOSIS — R002 Palpitations: Secondary | ICD-10-CM | POA: Diagnosis not present

## 2024-02-24 ENCOUNTER — Telehealth: Payer: Self-pay

## 2024-02-24 NOTE — Telephone Encounter (Signed)
 Patient calls nurse line in regards to zio patch.  She reports the patch was placed on 2/15, however she is having adverse reactions.   She reports since placing the patch the surrounding skin has been red and irritated. She reports mild itching.   She denies any fevers, chills or swelling.   She reports she called the company to see if she could take it off a few days early and mail it back.   She reports they are deferring decision to PCP.   Advised will forward to PCP.

## 2024-02-26 NOTE — Telephone Encounter (Signed)
 Called patient. She has patch off and in box to mail back. Cleaned off the adhesive area with the provided wipes and the skin irritaiton is already improved. Feels she does not need further intervention.  Eliezer Mccoy, MD

## 2024-03-03 DIAGNOSIS — R002 Palpitations: Secondary | ICD-10-CM | POA: Diagnosis not present

## 2024-03-14 ENCOUNTER — Other Ambulatory Visit: Payer: Self-pay | Admitting: Student

## 2024-03-14 ENCOUNTER — Telehealth: Payer: Self-pay | Admitting: Student

## 2024-03-14 DIAGNOSIS — Z1231 Encounter for screening mammogram for malignant neoplasm of breast: Secondary | ICD-10-CM

## 2024-03-14 NOTE — Progress Notes (Signed)
 I talked to Ms. Janoski about starting metop 12.5. She noted that she had been on it before, at that dose, and was taken off of it, because her HR was getting down into the 40's.  I was leaning towards holding off, but wanted to get your weigh in.  Thank you!  -BS

## 2024-03-14 NOTE — Telephone Encounter (Signed)
 Called to inform patient that heart monitor results came back and showed short periods of accelerated heart rate.   I told patient that I had talked with her heart doctor and he recommended starting metoprolol 12.5 BID. Heidi Wolf let me know that she had been on this in the past, and was taken off of it for HR in the 40's. She notes she was taking 12.5 at that time.  Given this history, I told patient we would not start the medication and she could follow up with her cardiologist next month about it.   Told patient I'm not sure where these palpitations are coming from, but she should follow up with her cardiologist.   Patient notes that she has appointment scheduled for 04/12/24 with cardiology.

## 2024-03-20 ENCOUNTER — Other Ambulatory Visit: Payer: Self-pay | Admitting: Student

## 2024-03-20 DIAGNOSIS — M5090 Cervical disc disorder, unspecified, unspecified cervical region: Secondary | ICD-10-CM

## 2024-03-22 DIAGNOSIS — E785 Hyperlipidemia, unspecified: Secondary | ICD-10-CM | POA: Diagnosis not present

## 2024-03-22 LAB — LIPID PANEL
Chol/HDL Ratio: 2.3 ratio (ref 0.0–4.4)
Cholesterol, Total: 102 mg/dL (ref 100–199)
HDL: 45 mg/dL (ref >39)
LDL Chol Calc (NIH): 40 mg/dL (ref 0–99)
Triglycerides: 86 mg/dL (ref 0–149)
VLDL Cholesterol Cal: 17 mg/dL (ref 5–40)

## 2024-04-10 NOTE — Progress Notes (Unsigned)
 Cardiology Office Note    Date:  04/12/2024  ID:  Heidi Wolf, DOB Oct 05, 1941, MRN [id number] PCP:  Wilhemena Harbour, MD  Cardiologist:  Eilleen Grates, MD  Electrophysiologist:  None   Chief Complaint: Follow-up for CAD  History of Present Illness: .    Heidi Wolf is a 83 y.o. female with visit-pertinent history of CAD, lumbar spinal stenosis, palpitations/PACs, hypertension, hyperlipidemia and diabetes mellitus.  In 2015 she underwent nuclear stress test that showed inferior wall scarring in the LV with akinetic cardiac apex and apical lateral segment.  There is no inducible ischemia.  Cardiac catheterization on 07/03/2016 resulted in PCI/DES to the diagonal branch.  She underwent a relook catheterization on 07/15/2016 that showed a patent stent.  Echocardiogram indicated preserved LVEF and no wall motion abnormality.  Heart monitor in 2017 for palpitations showed PACs and beta-blocker was increased.  In 2021 she was hospitalized with chest pain for 2 weeks.  She is admitted overnight for observation and cardiac enzymes remain negative with a nonischemic EKG.  Echocardiogram showed a normal LVEF and no RWMA.  She is discharged the following day without ischemic evaluation.  It was felt her chest pain was noncardiac and possibly related to viral gastroenteritis.  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  In 10/2023 patient presented to the PCPs office with a myriad of complaints including vertigo, headache, dizziness when she bends her head down and turns her head and chest tightness that was radiating to her right arm.  Given concern for ACS she was transferred to the ER for further workup.  On arrival troponin was negative x 2 and EKG appeared unchanged from prior.  It was recommended that she have repeat echocardiogram and PET stress.  Echocardiogram on  12/21/2023 indicated apical hypertrophy, LVEF 70 to 75%, no RWMA, grade 1 diastolic dysfunction, no significant change from prior study.  Patient was seen in clinic on 01/08/2024 by Dr. Lavonne Prairie.  She had numerous somatic complaints.  She had seen [facility] for dizziness but it was not thought to be vascular in etiology.  She was seeing physical therapy for possible vertigo.  She continued to note sporadic chest discomfort that had non anginal greater than anginal features.  Cardiac PET on 02/03/2024 showed a small defect with mild reduction in uptake present in the apical anterior locations that was reversible, there was abnormal wall motion of the defect area consistent with ischemia, small area of mild anterior apical ischemia with abnormal MBF are in moderate calcium noted in LAD, patient known to have apical hypertrophic cardiomyopathy.  Study was felt to be low risk.  Reviewed by Dr. Lavonne Prairie, no recommended changes.  Cardiac monitor placed by patient's PCP in 01/2024 indicated predominant rhythm is sinus rhythm, average heart rate 62 bpm ranging from 43 to 135 bpm.  She had 9 brief episodes of nonsustained SVT, no atrial fibrillation detected, rare supraventricular ectopy, rare ventricular ectopy.  Symptom triggered episodes corresponded to sinus rhythm, occasionally with ectopy.  Patient not started on metoprolol as she has history of bradycardia on beta-blockers.  Today she presents for follow up. She notes she is doing overall well. She reports she is tolerating Imdur well, notes her chest discomfort has resolved, she has not required any sublingual nitroglycerin. Continues to do housework and tolerates well.  She continues to note occasional palpitations, reports that these are not new,  notes an occasional skipped beat or a slight jolt in her chest, denies any associated symptoms.  ROS: .   Today she denies chest pain, shortness of breath, lower extremity edema, fatigue, melena, hematuria, hemoptysis,  diaphoresis, weakness, presyncope, syncope, orthopnea, and PND.  All other systems are reviewed and otherwise negative. Studies Reviewed: Aaron Aas    EKG:  EKG is not ordered today.  CV Studies: Cardiac studies reviewed are outlined and summarized above. Otherwise please see EMR for full report. Cardiac Studies & Procedures   ______________________________________________________________________________________________ CARDIAC CATHETERIZATION  CARDIAC CATHETERIZATION 07/15/2016  Conclusion  Ost 2nd Diag lesion, 30% stenosed. The lesion was not previously treated.  Ost Cx to Prox Cx lesion, 20% stenosed. The lesion was not previously treated.  Prox Cx lesion, 25% stenosed. The lesion was not previously treated.  Check platelet inhibition and consider Brilinta use.  Findings Coronary Findings Diagnostic  Dominance: Right  Left Main Vessel was injected. Vessel is normal in caliber. Vessel is angiographically normal. Double barrell  Left Anterior Descending Vessel was injected. Vessel is normal in caliber. Vessel is angiographically normal. Diagonal 2 osteal to 30 % stenosis  Second Diagonal Branch The lesion is type non-C Discrete. The lesion was not previously treated.  Pressure wire/FFR was not performed on the lesionIVUS was not performed on the lesion. Previously placed 2nd Diag drug eluting stent is patent.  Ramus Intermedius Vessel was not visualized.  Left Circumflex Vessel was injected. Vessel is normal in caliber. The vessel exhibits minimal luminal irregularities. The lesion is type non-C Discrete. The lesion was not previously treated.  Pressure wire/FFR was not performed on the lesionIVUS was not performed on the lesion. The lesion is type non-C Discrete. The lesion was not previously treated.  Pressure wire/FFR was not performed on the lesionIVUS was not performed on the lesion.  Right Coronary Artery Vessel was injected. Vessel is normal in caliber. Vessel is  angiographically normal.  Intervention  No interventions have been documented.   CARDIAC CATHETERIZATION  CARDIAC CATHETERIZATION 07/03/2016  Conclusion  2nd Diag lesion, 90% stenosed. Post intervention with a 2.25 x 12 Synergy drug eluting stent postdilated to > 2.5 mm, there is a 0% residual stenosis.  Continue dual antiplatelet therapy with aspirin and Plavix for at least 6-12 months ideally.  If she has sensitivity to aspirin, can continue Plavix alone long term.  Continue aggressive secondary prevention.  F/u with Dr. Sharyn Deforest.  Findings Coronary Findings Diagnostic  Dominance: Right  Left Anterior Descending  Second Diagonal Branch  Intervention  2nd Diag lesion PCI The pre-interventional distal flow is normal (TIMI 3). Pre-stent angioplasty was performed. A drug-eluting stent was placed. The strut is apposed. Post-stent angioplasty was performed. The post-interventional distal flow is normal (TIMI 3). The intervention was successful. No complications occurred at this lesion. Supplies used: BALLN EMERGE MR 2.0X12; STENT SYNERGY DES 2.25X12; BALLN [place] EMERGE MR 2.5X8 There is a 0% residual stenosis post intervention.   STRESS TESTS  NM PET CT CARDIAC PERFUSION MULTI W/ABSOLUTE BLOODFLOW 02/03/2024  Narrative   LV perfusion is abnormal. Defect 1: There is a small defect with mild reduction in uptake present in the apical anterior location(s) that is reversible. There is abnormal wall motion in the defect area. Consistent with ischemia. The defect is consistent with abnormal perfusion in the LAD territory.   Rest left ventricular function is abnormal. Rest global function is mildly reduced. There was a single regional abnormality. Rest EF: 49%. Stress EF: 58%. End diastolic cavity size is  normal. End systolic cavity size is normal.   Myocardial blood flow was computed to be 1.46ml/g/min at rest and 2,011.7ml/g/min at stress. Global myocardial blood flow reserve was 1,764.04  and was mildly abnormal.   Coronary calcium was present on the attenuation correction CT images. Moderate coronary calcifications were present. Coronary calcifications were present in the left anterior descending artery distribution(s).   Findings are consistent with ischemia. The study is low risk.   Small area of mild anteroapical ischemia with abnormal MBFR and moderate calcium noted in LAD Note patient also known to have apical hypertrophic cardiomyopathy  CLINICAL DATA:  This over-read does not include interpretation of cardiac or coronary anatomy or pathology. The Cardiac PET CT interpretation by the cardiologist is attached.  COMPARISON:  CT chest angiogram, 12/12/2023  FINDINGS: Cardiovascular: No significant extracardiac vascular findings. Normal heart size. Left coronary artery calcifications. No pericardial effusion.  Limited Mediastinum/Nodes: No enlarged mediastinal, hilar, or axillary lymph nodes. Small hiatal hernia. Trachea and esophagus demonstrate no significant findings.  Limited Lungs/Pleura: Lungs are clear. No pleural effusion or pneumothorax.  Upper Abdomen: No acute abnormality.  Status post cholecystectomy.  Musculoskeletal: No chest wall abnormality. No acute osseous findings.  IMPRESSION: 1. No acute abnormality of the included chest. 2. Coronary artery disease. 3. Small hiatal hernia.   Electronically Signed By: Fredricka Jenny M.D. On: 02/03/2024 12:47   ECHOCARDIOGRAM  ECHOCARDIOGRAM COMPLETE 12/21/2023  Narrative ECHOCARDIOGRAM REPORT    Patient Name:   Heidi Wolf Date of Exam: 12/21/2023 Medical Rec #:  [id number]           Height:       64.0 in Accession #:    [id number]          Weight:       164.0 lb Date of Birth:  1941/01/13           BSA:          1.798 m Patient Age:    82 years            BP:           125/63 mmHg Patient Gender: F                   HR:           59 bpm. Exam Location:  Outpatient  Procedure: 2D  Echo, 3D Echo, Cardiac Doppler, Color Doppler and Strain Analysis  Indications:    Precordial pain [R07.2 (ICD-10-CM)]; CAD- S/P PCI July 2017 [I25.10, Z98.61 (ICD-10-CM)]; Precordial chest pain [R07.2 (ICD-10-CM)]  History:        Patient has prior history of Echocardiogram examinations, most recent 02/14/2021.  Sonographer:    Delford Felling MHA, RDMS, RVT, RDCS Referring Phys: [id number] [facility] NICOLE [facility]   Sonographer Comments: Patient needed to sit upright secondary to vertigo. Global longitudinal strain was attempted. Patient declined Definity; stated she will be having another cardiac test in February, and she is a difficult stick. IMPRESSIONS   1. Apical hypertrophy. Left ventricular ejection fraction, by estimation, is 70 to 75%. Left ventricular ejection fraction by 3D volume is 72 %. The left ventricle has hyperdynamic function. The left ventricle has no regional wall motion abnormalities. Left ventricular diastolic parameters are consistent with Grade I diastolic dysfunction (impaired relaxation). 2. Right ventricular systolic function is normal. The right ventricular size is normal. 3. The mitral valve is normal in structure. No evidence of mitral valve regurgitation. No evidence of mitral stenosis. 4. The  aortic valve is normal in structure. Aortic valve regurgitation is not visualized. No aortic stenosis is present. 5. The inferior vena cava is normal in size with greater than 50% respiratory variability, suggesting right atrial pressure of 3 mmHg.  Comparison(s): No significant change from prior study. Prior images reviewed side by side.  FINDINGS Left Ventricle: Apical hypertrophy. Left ventricular ejection fraction, by estimation, is 70 to 75%. Left ventricular ejection fraction by 3D volume is 72 %. The left ventricle has hyperdynamic function. The left ventricle has no regional wall motion abnormalities. Global longitudinal strain performed but not reported based on  interpreter judgement due to suboptimal tracking. The left ventricular internal cavity size was normal in size. There is no left ventricular hypertrophy. Left ventricular diastolic parameters are consistent with Grade I diastolic dysfunction (impaired relaxation).  Right Ventricle: The right ventricular size is normal. No increase in right ventricular wall thickness. Right ventricular systolic function is normal.  Left Atrium: Left atrial size was normal in size.  Right Atrium: Right atrial size was normal in size.  Pericardium: There is no evidence of pericardial effusion.  Mitral Valve: The mitral valve is normal in structure. No evidence of mitral valve regurgitation. No evidence of mitral valve stenosis.  Tricuspid Valve: The tricuspid valve is normal in structure. Tricuspid valve regurgitation is not demonstrated. No evidence of tricuspid stenosis.  Aortic Valve: The aortic valve is normal in structure. Aortic valve regurgitation is not visualized. No aortic stenosis is present. Aortic valve mean gradient measures 4.0 mmHg. Aortic valve peak gradient measures 8.3 mmHg. Aortic valve area, by VTI measures 1.45 cm.  Pulmonic Valve: The pulmonic valve was normal in structure. Pulmonic valve regurgitation is not visualized. No evidence of pulmonic stenosis.  Aorta: The aortic root is normal in size and structure.  Venous: The inferior vena cava is normal in size with greater than 50% respiratory variability, suggesting right atrial pressure of 3 mmHg.  IAS/Shunts: No atrial level shunt detected by color flow Doppler.   LEFT VENTRICLE PLAX 2D LVIDd:         3.92 cm         Diastology LVIDs:         2.37 cm         LV e' medial:    4.90 cm/s LV PW:         1.06 cm         LV E/e' medial:  12.0 LV IVS:        0.94 cm         LV e' lateral:   5.98 cm/s LVOT diam:     1.51 cm         LV E/e' lateral: 9.8 LV SV:         43 LV SV Index:   24 LVOT Area:     1.79 cm        3D Volume  EF LV 3D EF:    Left ventricul ar ejection fraction by 3D volume is 72 %.  3D Volume EF: 3D EF:        72 % LV EDV:       193 ml LV ESV:       55 ml LV SV:        138 ml  RIGHT VENTRICLE RV Basal diam:  3.42 cm RV Mid diam:    3.13 cm RV S prime:     15.90 cm/s TAPSE (M-mode): 1.5 cm  LEFT ATRIUM  Index        RIGHT ATRIUM           Index LA diam:        2.95 cm 1.64 cm/m   RA Area:     14.00 cm LA Vol (A2C):   34.8 ml 19.35 ml/m  RA Volume:   35.50 ml  19.74 ml/m LA Vol (A4C):   55.3 ml 30.75 ml/m LA Biplane Vol: 43.5 ml 24.19 ml/m AORTIC VALVE AV Area (Vmax):    1.43 cm AV Area (Vmean):   1.39 cm AV Area (VTI):     1.45 cm AV Vmax:           144.00 cm/s AV Vmean:          97.000 cm/s AV VTI:            0.293 m AV Peak Grad:      8.3 mmHg AV Mean Grad:      4.0 mmHg LVOT Vmax:         115.00 cm/s LVOT Vmean:        75.500 cm/s LVOT VTI:          0.238 m LVOT/AV VTI ratio: 0.81  AORTA Ao Root diam: 2.61 cm Ao Asc diam:  2.48 cm  MR Peak grad: 31.8 mmHg    TRICUSPID VALVE MR Vmax:      282.00 cm/s  TR Peak grad:   6.8 mmHg MV E velocity: 58.70 cm/s  TR Vmax:        130.00 cm/s MV A velocity: 82.80 cm/s MV E/A ratio:  0.71        SHUNTS Systemic VTI:  0.24 m Systemic Diam: 1.51 cm  Dorothye Gathers MD Electronically signed by Dorothye Gathers MD Signature Date/Time: 12/21/2023/2:12:51 PM    Final    MONITORS  LONG TERM MONITOR (3-14 DAYS) 03/03/2024  Narrative 13 day monitor  Sinus rhythm HR 43 - 135, average 62 bpm. 9 brief episodes of nonsustained SVT (longest 6.6 seconds, 11 beats) No atrial fibrillation detected. Rare supraventricular ectopy. Rare ventricular ectopy. No sustained arrhythmias. Symptom trigger episodes correspond to sinus rhythm, occasionally with ectopy      Donnamae Gaba Mealor Cardiac Electrophysiology       ______________________________________________________________________________________________        Current Reported Medications:.    Current Meds  Medication Sig   ACCU-CHEK AVIVA PLUS test strip USE TO CHECK BLOOD SUGAR UP TO ONCE DAILY AS NEEDED   Accu-Chek Softclix Lancets lancets Please use to check blood sugar once daily.   aspirin EC 81 MG tablet Take 1 tablet (81 mg total) by mouth daily. (Patient taking differently: Take 81 mg by mouth in the morning.)   benazepril (LOTENSIN) 20 MG tablet Take 1 tablet (20 mg total) by mouth in the morning.   CHOLECALCIFEROL PO Take 1,000 Units by mouth in the morning.   famotidine (PEPCID) 10 MG tablet Take 1 tablet (10 mg total) by mouth 2 (two) times daily.   gabapentin (NEURONTIN) 100 MG capsule TAKE 1 CAPSULE(100 MG) BY MOUTH TWICE DAILY AS NEEDED   isosorbide mononitrate (IMDUR) 60 MG 24 hr tablet Take 1 tablet (60 mg total) by mouth daily.   LUMIGAN 0.01 % SOLN Place 1 drop into both eyes at bedtime.    nitroGLYCERIN (NITROSTAT) 0.4 MG SL tablet Place 1 tablet (0.4 mg total) under the tongue every 5 (five) minutes as needed for chest pain.   polyethylene glycol powder (GLYCOLAX/MIRALAX) 17 GM/SCOOP powder Take  17 g by mouth daily as needed.   tolnaftate (TINACTIN) 1 % external solution Apply 1 Application topically at bedtime. Absorbine Jr   Current Facility-Administered Medications for the 04/12/24 encounter (Office Visit) with Rashae Rother D, NP  Medication   aspirin tablet 325 mg    Physical Exam:    VS:  BP 130/66   Pulse 62   Ht 5\' 4"  (1.626 m)   Wt 165 lb (74.8 kg)   SpO2 97%   BMI 28.32 kg/m    Wt Readings from Last 3 Encounters:  04/12/24 165 lb (74.8 kg)  01/28/24 161 lb 12.8 oz (73.4 kg)  01/15/24 163 lb 3.2 oz (74 kg)    GEN: Well nourished, well developed in no acute distress NECK: No JVD; No carotid bruits CARDIAC: RRR, no murmurs, rubs, gallops RESPIRATORY:  Clear to auscultation without rales, wheezing or rhonchi  ABDOMEN: Soft, non-tender, non-distended EXTREMITIES:  No edema; No acute deformity      Asessement and Plan:.    CAD: S/p PCI/DES to the diagonal branch in 2017. Cardiac PET on 02/03/2024 showed a small defect with mild reduction in uptake present in the apical anterior locations that was reversible, there was abnormal wall motion of the defect area consistent with ischemia, small area of mild anterior apical ischemia with abnormal MBF are in moderate calcium noted in LAD, patient known to have apical hypertrophic cardiomyopathy.  Study was felt to be low risk.  Reviewed by Dr. Lavonne Prairie, no recommended changes.  Today patient reports she is doing well, denies any further chest discomfort on Imdur, continues to do housework and tolerates well. Heart healthy diet and regular cardiovascular exercise encouraged.  Reviewed ED precautions.  Continue aspirin 81 mg daily, benazepril 20 mg daily, Imdur 60 mg daily, amlodipine 10 mg daily and rosuvastatin 40 mg daily.  Apical hypertrophy: Noted on echocardiogram in 11/2023.  Today patient denies any new or increases in palpitations, noted to have 9 brief episodes of nonsustained SVT on cardiac monitor in February, patient is not on metoprolol given history of bradycardia on beta-blockers. Patient denies presyncope or syncope. Per Dr. Atlas Lea notes no further testing indicated at this time.  Patient to notify the office if she starts to have any presyncopal symptoms.  Reviewed ED precautions.  HTN: Blood pressure today 138/64, on recheck was 130/66. Continue current antihypertensive regimen.   DM: Last hemoglobin A1C 6.1. Monitored and managed per PCP.   Hyperlipidemia: Last lipid profile on 03/22/2024 indicated total cholesterol 102, HDL 45, triglycerides 86 and LDL 40. Continue rosuvastatin 40 mg daily.     Disposition: F/u with Korrie Hofbauer, NP or Dr. Lavonne Prairie in 6 months or sooner if needed.   Signed, Chaquita Basques D Capri Raben, NP

## 2024-04-12 ENCOUNTER — Ambulatory Visit: Attending: Cardiology | Admitting: Cardiology

## 2024-04-12 ENCOUNTER — Encounter: Payer: Self-pay | Admitting: Cardiology

## 2024-04-12 ENCOUNTER — Ambulatory Visit: Payer: Medicare Other | Admitting: Physician Assistant

## 2024-04-12 VITALS — BP 130/66 | HR 62 | Ht 64.0 in | Wt 165.0 lb

## 2024-04-12 DIAGNOSIS — E785 Hyperlipidemia, unspecified: Secondary | ICD-10-CM

## 2024-04-12 DIAGNOSIS — I2510 Atherosclerotic heart disease of native coronary artery without angina pectoris: Secondary | ICD-10-CM

## 2024-04-12 DIAGNOSIS — E118 Type 2 diabetes mellitus with unspecified complications: Secondary | ICD-10-CM | POA: Diagnosis not present

## 2024-04-12 DIAGNOSIS — I10 Essential (primary) hypertension: Secondary | ICD-10-CM

## 2024-04-12 DIAGNOSIS — I517 Cardiomegaly: Secondary | ICD-10-CM | POA: Diagnosis not present

## 2024-04-12 NOTE — Patient Instructions (Signed)
 Medication Instructions:  NO CHANGES   Lab Work: NONE    Testing/Procedures: NONE  Follow-Up: At [facility], you and your health needs are our priority.  As part of our continuing mission to provide you with exceptional heart care, our providers are all part of one team.  This team includes your primary Cardiologist (physician) and Advanced Practice Providers or APPs (Physician Assistants and Nurse Practitioners) who all work together to provide you with the care you need, when you need it.  Your next appointment:   6 month(s)  Provider:   Eilleen Grates, MD     We recommend signing up for the patient portal called "MyChart".  Sign up information is provided on this After Visit Summary.  MyChart is used to connect with patients for Virtual Visits (Telemedicine).  Patients are able to view lab/test results, encounter notes, upcoming appointments, etc.  Non-urgent messages can be sent to your provider as well.   To learn more about what you can do with MyChart, go to [email or web address].   Other Instructions:      1st Floor: - Lobby - Registration  - Pharmacy  - Lab - Cafe  2nd Floor: - PV Lab - Diagnostic Testing (echo, CT, nuclear med)  3rd Floor: - Vacant  4th Floor: - TCTS (cardiothoracic surgery) - AFib Clinic - Structural Heart Clinic - Vascular Surgery  - Vascular Ultrasound  5th Floor: - [facility] Card[facility] (general and EP) - Clinical Pharmacy for coumadin, hypertension, lipid, weight-loss medications, and med management appointments    Valet parking services will be available as well.

## 2024-04-22 ENCOUNTER — Ambulatory Visit: Admitting: Student

## 2024-04-22 ENCOUNTER — Encounter: Payer: Self-pay | Admitting: Student

## 2024-04-22 VITALS — BP 131/70 | HR 76 | Ht 64.0 in | Wt 161.8 lb

## 2024-04-22 DIAGNOSIS — R002 Palpitations: Secondary | ICD-10-CM

## 2024-04-22 DIAGNOSIS — M25551 Pain in right hip: Secondary | ICD-10-CM

## 2024-04-22 DIAGNOSIS — E1159 Type 2 diabetes mellitus with other circulatory complications: Secondary | ICD-10-CM | POA: Diagnosis not present

## 2024-04-22 LAB — POCT GLYCOSYLATED HEMOGLOBIN (HGB A1C): HbA1c, POC (controlled diabetic range): 5.9 % (ref 0.0–7.0)

## 2024-04-22 NOTE — Assessment & Plan Note (Addendum)
 Patient comes in for follow-up of palpitations.  Patient continues to have palpitations multiple times a day.  Patient seen by cardiology, had testing that was negative for cardiac causes.  Patient also wore Holter monitor, had infrequent runs of SVT, but will not be treated given history of low blood pressures with metoprolol  use.  I am unsure what is causing these palpitations for patient, but less concerned that it is coming from her heart.  Less concerned that this is something dangerous, while it may just be something she has to tolerate. - Follow-up as needed

## 2024-04-22 NOTE — Progress Notes (Addendum)
  SUBJECTIVE:   CHIEF COMPLAINT / HPI:   Heart Palpitations -Seen by Cardiology 4/15, likely non-cardiac cause Patient comes in and continues to have palpitations multiple times throughout the day.  Patient has been seen by cardiology, do not know etiology of these palpitations but they do not appear to be her heart.   Weakness Patient notes that yesterday she had some right sided hip pain that was spreading down her leg, and caused her leg to feel weak/numb.  This has not happened before, but patient does have a history of back problems  PERTINENT  PMH / PSH:   OBJECTIVE:  BP 131/70   Pulse 76   Ht 5\' 4"  (1.626 m)   Wt 161 lb 12.8 oz (73.4 kg)   SpO2 100%   BMI 27.77 kg/m  Physical Exam Constitutional:      General: She is not in acute distress.    Appearance: Normal appearance. She is not ill-appearing.  Cardiovascular:     Rate and Rhythm: Normal rate and regular rhythm.     Pulses: Normal pulses.     Heart sounds: Normal heart sounds. No murmur heard.    No friction rub. No gallop.  Pulmonary:     Effort: Pulmonary effort is normal. No respiratory distress.     Breath sounds: Normal breath sounds. No stridor. No wheezing, rhonchi or rales.  Musculoskeletal:     Right hip: No deformity, tenderness or bony tenderness. Decreased range of motion.  Neurological:     Mental Status: She is alert.     Cranial Nerves: Cranial nerves 2-12 are intact. No cranial nerve deficit, dysarthria or facial asymmetry.     Sensory: Sensation is intact. No sensory deficit.     Motor: Motor function is intact. No weakness, tremor, atrophy, abnormal muscle tone or seizure activity.     Gait: Gait is intact.      ASSESSMENT/PLAN:   Assessment & Plan Pain of right hip Patient comes in with complaint of right hip pain.  Patient has history of arthritis in both hips.  Patient appreciates that earlier in the week she had pain shooting down her hip down her leg, and then felt like her leg was  weak/a little numb.  Patient has normal neuroexam today in office, and is walking well on exam.  No signs of leg dragging/foot drop.  Given history of spinal stenosis and hip arthritis suspect arthritis causing pain and neural symptoms.  Will recommend physical therapy to help with leg strength and sensation. Low concern for CVA. - PT right hip Palpitations Patient comes in for follow-up of palpitations.  Patient continues to have palpitations multiple times a day.  Patient seen by cardiology, had testing that was negative for cardiac causes.  Patient also wore Holter monitor, had infrequent runs of SVT, but will not be treated given history of low blood pressures with metoprolol  use.  I am unsure what is causing these palpitations for patient, but less concerned that it is coming from her heart.  Less concerned that this is something dangerous, while it may just be something she has to tolerate. - Follow-up as needed No follow-ups on file. Wilhemena Harbour, MD 04/22/2024, 3:27 PM PGY-3, [facility]

## 2024-04-22 NOTE — Assessment & Plan Note (Addendum)
 Patient comes in with complaint of right hip pain.  Patient has history of arthritis in both hips.  Patient appreciates that earlier in the week she had pain shooting down her hip down her leg, and then felt like her leg was weak/a little numb.  Patient has normal neuroexam today in office, and is walking well on exam.  No signs of leg dragging/foot drop.  Given history of spinal stenosis and hip arthritis suspect arthritis causing pain and neural symptoms.  Will recommend physical therapy to help with leg strength and sensation. Low concern for CVA. - PT right hip

## 2024-04-22 NOTE — Patient Instructions (Signed)
 It was great to see you! Thank you for allowing me to participate in your care!  I recommend that you always bring your medications to each appointment as this makes it easy to ensure we are on the correct medications and helps us  not miss when refills are needed.  Our plans for today:  - Palpitations  I'm not sure what is causing your palpitations, but it don't think it's your heart, or anything dangerous, at this time. We'll continue to tolerate it, but let me know if it get's worse, or you start having pain/breathing issues.   - Hip Pain  I think some of your leg weakness was coming from your hip pain. Your neuro exam was normal, so I don't think you've had a stroke. I'm going to send you to Physical Therapy to help gain strength and function in your hip/legs.   You should hear something about Physical Therapy in the next 2-4 weeks. If you haven't heard anything by then, call the clinic and ask about the referral.    Take care and seek immediate care sooner if you develop any concerns.   Dr. Wilhemena Harbour, MD [facility]

## 2024-04-25 ENCOUNTER — Ambulatory Visit

## 2024-04-26 ENCOUNTER — Encounter: Payer: Self-pay | Admitting: Student

## 2024-05-09 DIAGNOSIS — Z7982 Long term (current) use of aspirin: Secondary | ICD-10-CM | POA: Diagnosis not present

## 2024-05-09 DIAGNOSIS — E1159 Type 2 diabetes mellitus with other circulatory complications: Secondary | ICD-10-CM | POA: Diagnosis not present

## 2024-05-09 DIAGNOSIS — H401131 Primary open-angle glaucoma, bilateral, mild stage: Secondary | ICD-10-CM | POA: Diagnosis not present

## 2024-05-09 DIAGNOSIS — N400 Enlarged prostate without lower urinary tract symptoms: Secondary | ICD-10-CM | POA: Diagnosis not present

## 2024-05-09 DIAGNOSIS — E1169 Type 2 diabetes mellitus with other specified complication: Secondary | ICD-10-CM | POA: Diagnosis not present

## 2024-05-09 DIAGNOSIS — M160 Bilateral primary osteoarthritis of hip: Secondary | ICD-10-CM | POA: Diagnosis not present

## 2024-05-09 DIAGNOSIS — M48061 Spinal stenosis, lumbar region without neurogenic claudication: Secondary | ICD-10-CM | POA: Diagnosis not present

## 2024-05-09 DIAGNOSIS — I517 Cardiomegaly: Secondary | ICD-10-CM | POA: Diagnosis not present

## 2024-05-09 DIAGNOSIS — E1136 Type 2 diabetes mellitus with diabetic cataract: Secondary | ICD-10-CM | POA: Diagnosis not present

## 2024-05-09 DIAGNOSIS — E041 Nontoxic single thyroid nodule: Secondary | ICD-10-CM | POA: Diagnosis not present

## 2024-05-09 DIAGNOSIS — I152 Hypertension secondary to endocrine disorders: Secondary | ICD-10-CM | POA: Diagnosis not present

## 2024-05-09 DIAGNOSIS — M5090 Cervical disc disorder, unspecified, unspecified cervical region: Secondary | ICD-10-CM | POA: Diagnosis not present

## 2024-05-09 DIAGNOSIS — M1711 Unilateral primary osteoarthritis, right knee: Secondary | ICD-10-CM | POA: Diagnosis not present

## 2024-05-09 DIAGNOSIS — M19049 Primary osteoarthritis, unspecified hand: Secondary | ICD-10-CM | POA: Diagnosis not present

## 2024-05-09 DIAGNOSIS — E785 Hyperlipidemia, unspecified: Secondary | ICD-10-CM | POA: Diagnosis not present

## 2024-05-09 DIAGNOSIS — Z955 Presence of coronary angioplasty implant and graft: Secondary | ICD-10-CM | POA: Diagnosis not present

## 2024-05-09 DIAGNOSIS — I2510 Atherosclerotic heart disease of native coronary artery without angina pectoris: Secondary | ICD-10-CM | POA: Diagnosis not present

## 2024-05-09 DIAGNOSIS — Z556 Problems related to health literacy: Secondary | ICD-10-CM | POA: Diagnosis not present

## 2024-05-09 DIAGNOSIS — K219 Gastro-esophageal reflux disease without esophagitis: Secondary | ICD-10-CM | POA: Diagnosis not present

## 2024-05-13 DIAGNOSIS — M1711 Unilateral primary osteoarthritis, right knee: Secondary | ICD-10-CM | POA: Diagnosis not present

## 2024-05-13 DIAGNOSIS — M19049 Primary osteoarthritis, unspecified hand: Secondary | ICD-10-CM | POA: Diagnosis not present

## 2024-05-13 DIAGNOSIS — M160 Bilateral primary osteoarthritis of hip: Secondary | ICD-10-CM | POA: Diagnosis not present

## 2024-05-13 DIAGNOSIS — M48061 Spinal stenosis, lumbar region without neurogenic claudication: Secondary | ICD-10-CM | POA: Diagnosis not present

## 2024-05-13 DIAGNOSIS — I152 Hypertension secondary to endocrine disorders: Secondary | ICD-10-CM | POA: Diagnosis not present

## 2024-05-13 DIAGNOSIS — E1159 Type 2 diabetes mellitus with other circulatory complications: Secondary | ICD-10-CM | POA: Diagnosis not present

## 2024-05-18 DIAGNOSIS — E1159 Type 2 diabetes mellitus with other circulatory complications: Secondary | ICD-10-CM | POA: Diagnosis not present

## 2024-05-18 DIAGNOSIS — M1711 Unilateral primary osteoarthritis, right knee: Secondary | ICD-10-CM | POA: Diagnosis not present

## 2024-05-18 DIAGNOSIS — M160 Bilateral primary osteoarthritis of hip: Secondary | ICD-10-CM | POA: Diagnosis not present

## 2024-05-18 DIAGNOSIS — I152 Hypertension secondary to endocrine disorders: Secondary | ICD-10-CM | POA: Diagnosis not present

## 2024-05-18 DIAGNOSIS — M19049 Primary osteoarthritis, unspecified hand: Secondary | ICD-10-CM | POA: Diagnosis not present

## 2024-05-18 DIAGNOSIS — M48061 Spinal stenosis, lumbar region without neurogenic claudication: Secondary | ICD-10-CM | POA: Diagnosis not present

## 2024-05-24 DIAGNOSIS — I152 Hypertension secondary to endocrine disorders: Secondary | ICD-10-CM | POA: Diagnosis not present

## 2024-05-24 DIAGNOSIS — M160 Bilateral primary osteoarthritis of hip: Secondary | ICD-10-CM | POA: Diagnosis not present

## 2024-05-24 DIAGNOSIS — M48061 Spinal stenosis, lumbar region without neurogenic claudication: Secondary | ICD-10-CM | POA: Diagnosis not present

## 2024-05-24 DIAGNOSIS — M1711 Unilateral primary osteoarthritis, right knee: Secondary | ICD-10-CM | POA: Diagnosis not present

## 2024-05-24 DIAGNOSIS — M19049 Primary osteoarthritis, unspecified hand: Secondary | ICD-10-CM | POA: Diagnosis not present

## 2024-05-24 DIAGNOSIS — E1159 Type 2 diabetes mellitus with other circulatory complications: Secondary | ICD-10-CM | POA: Diagnosis not present

## 2024-06-02 ENCOUNTER — Other Ambulatory Visit (HOSPITAL_COMMUNITY): Payer: Self-pay | Admitting: Interventional Radiology

## 2024-06-02 DIAGNOSIS — I729 Aneurysm of unspecified site: Secondary | ICD-10-CM

## 2024-06-07 ENCOUNTER — Encounter: Payer: Self-pay | Admitting: *Deleted

## 2024-06-08 DIAGNOSIS — E1159 Type 2 diabetes mellitus with other circulatory complications: Secondary | ICD-10-CM | POA: Diagnosis not present

## 2024-06-08 DIAGNOSIS — I517 Cardiomegaly: Secondary | ICD-10-CM | POA: Diagnosis not present

## 2024-06-08 DIAGNOSIS — M19049 Primary osteoarthritis, unspecified hand: Secondary | ICD-10-CM | POA: Diagnosis not present

## 2024-06-08 DIAGNOSIS — I2510 Atherosclerotic heart disease of native coronary artery without angina pectoris: Secondary | ICD-10-CM | POA: Diagnosis not present

## 2024-06-08 DIAGNOSIS — H401131 Primary open-angle glaucoma, bilateral, mild stage: Secondary | ICD-10-CM | POA: Diagnosis not present

## 2024-06-08 DIAGNOSIS — M1711 Unilateral primary osteoarthritis, right knee: Secondary | ICD-10-CM | POA: Diagnosis not present

## 2024-06-08 DIAGNOSIS — M5090 Cervical disc disorder, unspecified, unspecified cervical region: Secondary | ICD-10-CM | POA: Diagnosis not present

## 2024-06-08 DIAGNOSIS — E1169 Type 2 diabetes mellitus with other specified complication: Secondary | ICD-10-CM | POA: Diagnosis not present

## 2024-06-08 DIAGNOSIS — N400 Enlarged prostate without lower urinary tract symptoms: Secondary | ICD-10-CM | POA: Diagnosis not present

## 2024-06-08 DIAGNOSIS — I152 Hypertension secondary to endocrine disorders: Secondary | ICD-10-CM | POA: Diagnosis not present

## 2024-06-08 DIAGNOSIS — Z7982 Long term (current) use of aspirin: Secondary | ICD-10-CM | POA: Diagnosis not present

## 2024-06-08 DIAGNOSIS — Z955 Presence of coronary angioplasty implant and graft: Secondary | ICD-10-CM | POA: Diagnosis not present

## 2024-06-08 DIAGNOSIS — M160 Bilateral primary osteoarthritis of hip: Secondary | ICD-10-CM | POA: Diagnosis not present

## 2024-06-08 DIAGNOSIS — E785 Hyperlipidemia, unspecified: Secondary | ICD-10-CM | POA: Diagnosis not present

## 2024-06-08 DIAGNOSIS — Z556 Problems related to health literacy: Secondary | ICD-10-CM | POA: Diagnosis not present

## 2024-06-08 DIAGNOSIS — E1136 Type 2 diabetes mellitus with diabetic cataract: Secondary | ICD-10-CM | POA: Diagnosis not present

## 2024-06-08 DIAGNOSIS — E041 Nontoxic single thyroid nodule: Secondary | ICD-10-CM | POA: Diagnosis not present

## 2024-06-08 DIAGNOSIS — K219 Gastro-esophageal reflux disease without esophagitis: Secondary | ICD-10-CM | POA: Diagnosis not present

## 2024-06-08 DIAGNOSIS — M48061 Spinal stenosis, lumbar region without neurogenic claudication: Secondary | ICD-10-CM | POA: Diagnosis not present

## 2024-06-14 DIAGNOSIS — H25813 Combined forms of age-related cataract, bilateral: Secondary | ICD-10-CM | POA: Diagnosis not present

## 2024-06-14 DIAGNOSIS — H401131 Primary open-angle glaucoma, bilateral, mild stage: Secondary | ICD-10-CM | POA: Diagnosis not present

## 2024-06-15 ENCOUNTER — Ambulatory Visit (HOSPITAL_COMMUNITY)
Admission: RE | Admit: 2024-06-15 | Discharge: 2024-06-15 | Disposition: A | Discharge status: Home / Self Care | Source: Ambulatory Visit | Attending: Interventional Radiology | Admitting: Interventional Radiology

## 2024-06-15 DIAGNOSIS — I729 Aneurysm of unspecified site: Secondary | ICD-10-CM | POA: Diagnosis not present

## 2024-06-15 DIAGNOSIS — R519 Headache, unspecified: Secondary | ICD-10-CM | POA: Insufficient documentation

## 2024-06-15 DIAGNOSIS — I671 Cerebral aneurysm, nonruptured: Secondary | ICD-10-CM | POA: Diagnosis present

## 2024-06-15 DIAGNOSIS — R42 Dizziness and giddiness: Secondary | ICD-10-CM | POA: Insufficient documentation

## 2024-06-15 DIAGNOSIS — Z0389 Encounter for observation for other suspected diseases and conditions ruled out: Secondary | ICD-10-CM | POA: Diagnosis not present

## 2024-06-21 DIAGNOSIS — M1711 Unilateral primary osteoarthritis, right knee: Secondary | ICD-10-CM | POA: Diagnosis not present

## 2024-06-21 DIAGNOSIS — I152 Hypertension secondary to endocrine disorders: Secondary | ICD-10-CM | POA: Diagnosis not present

## 2024-06-21 DIAGNOSIS — M160 Bilateral primary osteoarthritis of hip: Secondary | ICD-10-CM | POA: Diagnosis not present

## 2024-06-21 DIAGNOSIS — M19049 Primary osteoarthritis, unspecified hand: Secondary | ICD-10-CM | POA: Diagnosis not present

## 2024-06-21 DIAGNOSIS — M48061 Spinal stenosis, lumbar region without neurogenic claudication: Secondary | ICD-10-CM | POA: Diagnosis not present

## 2024-06-21 DIAGNOSIS — E1159 Type 2 diabetes mellitus with other circulatory complications: Secondary | ICD-10-CM | POA: Diagnosis not present

## 2024-06-29 ENCOUNTER — Telehealth (HOSPITAL_COMMUNITY): Payer: Self-pay

## 2024-06-29 NOTE — Telephone Encounter (Signed)
-----   Message from [facility] sent at 06/29/2024  2:46 PM EDT ----- Regarding: Dev F/U No need for any further NIR follow up

## 2024-06-29 NOTE — Telephone Encounter (Signed)
 Pt is aware that no further f/u is needed. AB

## 2024-06-29 NOTE — Telephone Encounter (Signed)
 Called regarding recent imaging, no answer, no vm. AB

## 2024-07-05 DIAGNOSIS — M19049 Primary osteoarthritis, unspecified hand: Secondary | ICD-10-CM | POA: Diagnosis not present

## 2024-07-05 DIAGNOSIS — M160 Bilateral primary osteoarthritis of hip: Secondary | ICD-10-CM | POA: Diagnosis not present

## 2024-07-05 DIAGNOSIS — M48061 Spinal stenosis, lumbar region without neurogenic claudication: Secondary | ICD-10-CM | POA: Diagnosis not present

## 2024-07-05 DIAGNOSIS — E1159 Type 2 diabetes mellitus with other circulatory complications: Secondary | ICD-10-CM | POA: Diagnosis not present

## 2024-07-05 DIAGNOSIS — M1711 Unilateral primary osteoarthritis, right knee: Secondary | ICD-10-CM | POA: Diagnosis not present

## 2024-07-05 DIAGNOSIS — I152 Hypertension secondary to endocrine disorders: Secondary | ICD-10-CM | POA: Diagnosis not present

## 2024-07-08 DIAGNOSIS — E1136 Type 2 diabetes mellitus with diabetic cataract: Secondary | ICD-10-CM | POA: Diagnosis not present

## 2024-07-08 DIAGNOSIS — N400 Enlarged prostate without lower urinary tract symptoms: Secondary | ICD-10-CM | POA: Diagnosis not present

## 2024-07-08 DIAGNOSIS — M19049 Primary osteoarthritis, unspecified hand: Secondary | ICD-10-CM | POA: Diagnosis not present

## 2024-07-08 DIAGNOSIS — E1159 Type 2 diabetes mellitus with other circulatory complications: Secondary | ICD-10-CM | POA: Diagnosis not present

## 2024-07-08 DIAGNOSIS — K219 Gastro-esophageal reflux disease without esophagitis: Secondary | ICD-10-CM | POA: Diagnosis not present

## 2024-07-08 DIAGNOSIS — I2510 Atherosclerotic heart disease of native coronary artery without angina pectoris: Secondary | ICD-10-CM | POA: Diagnosis not present

## 2024-07-08 DIAGNOSIS — M1711 Unilateral primary osteoarthritis, right knee: Secondary | ICD-10-CM | POA: Diagnosis not present

## 2024-07-08 DIAGNOSIS — E1169 Type 2 diabetes mellitus with other specified complication: Secondary | ICD-10-CM | POA: Diagnosis not present

## 2024-07-08 DIAGNOSIS — E041 Nontoxic single thyroid nodule: Secondary | ICD-10-CM | POA: Diagnosis not present

## 2024-07-08 DIAGNOSIS — Z7982 Long term (current) use of aspirin: Secondary | ICD-10-CM | POA: Diagnosis not present

## 2024-07-08 DIAGNOSIS — I517 Cardiomegaly: Secondary | ICD-10-CM | POA: Diagnosis not present

## 2024-07-08 DIAGNOSIS — M5090 Cervical disc disorder, unspecified, unspecified cervical region: Secondary | ICD-10-CM | POA: Diagnosis not present

## 2024-07-08 DIAGNOSIS — E785 Hyperlipidemia, unspecified: Secondary | ICD-10-CM | POA: Diagnosis not present

## 2024-07-08 DIAGNOSIS — H401131 Primary open-angle glaucoma, bilateral, mild stage: Secondary | ICD-10-CM | POA: Diagnosis not present

## 2024-07-08 DIAGNOSIS — M160 Bilateral primary osteoarthritis of hip: Secondary | ICD-10-CM | POA: Diagnosis not present

## 2024-07-08 DIAGNOSIS — Z556 Problems related to health literacy: Secondary | ICD-10-CM | POA: Diagnosis not present

## 2024-07-08 DIAGNOSIS — I152 Hypertension secondary to endocrine disorders: Secondary | ICD-10-CM | POA: Diagnosis not present

## 2024-07-08 DIAGNOSIS — Z955 Presence of coronary angioplasty implant and graft: Secondary | ICD-10-CM | POA: Diagnosis not present

## 2024-07-08 DIAGNOSIS — M48061 Spinal stenosis, lumbar region without neurogenic claudication: Secondary | ICD-10-CM | POA: Diagnosis not present

## 2024-07-11 DIAGNOSIS — M160 Bilateral primary osteoarthritis of hip: Secondary | ICD-10-CM | POA: Diagnosis not present

## 2024-07-11 DIAGNOSIS — E1159 Type 2 diabetes mellitus with other circulatory complications: Secondary | ICD-10-CM | POA: Diagnosis not present

## 2024-07-11 DIAGNOSIS — M48061 Spinal stenosis, lumbar region without neurogenic claudication: Secondary | ICD-10-CM | POA: Diagnosis not present

## 2024-07-11 DIAGNOSIS — M1711 Unilateral primary osteoarthritis, right knee: Secondary | ICD-10-CM | POA: Diagnosis not present

## 2024-07-11 DIAGNOSIS — M19049 Primary osteoarthritis, unspecified hand: Secondary | ICD-10-CM | POA: Diagnosis not present

## 2024-07-11 DIAGNOSIS — I152 Hypertension secondary to endocrine disorders: Secondary | ICD-10-CM | POA: Diagnosis not present

## 2024-07-19 DIAGNOSIS — M19049 Primary osteoarthritis, unspecified hand: Secondary | ICD-10-CM | POA: Diagnosis not present

## 2024-07-19 DIAGNOSIS — M160 Bilateral primary osteoarthritis of hip: Secondary | ICD-10-CM | POA: Diagnosis not present

## 2024-07-19 DIAGNOSIS — I152 Hypertension secondary to endocrine disorders: Secondary | ICD-10-CM | POA: Diagnosis not present

## 2024-07-19 DIAGNOSIS — M1711 Unilateral primary osteoarthritis, right knee: Secondary | ICD-10-CM | POA: Diagnosis not present

## 2024-07-19 DIAGNOSIS — M48061 Spinal stenosis, lumbar region without neurogenic claudication: Secondary | ICD-10-CM | POA: Diagnosis not present

## 2024-07-19 DIAGNOSIS — E1159 Type 2 diabetes mellitus with other circulatory complications: Secondary | ICD-10-CM | POA: Diagnosis not present

## 2024-07-25 DIAGNOSIS — M19049 Primary osteoarthritis, unspecified hand: Secondary | ICD-10-CM | POA: Diagnosis not present

## 2024-07-25 DIAGNOSIS — I152 Hypertension secondary to endocrine disorders: Secondary | ICD-10-CM | POA: Diagnosis not present

## 2024-07-25 DIAGNOSIS — E1159 Type 2 diabetes mellitus with other circulatory complications: Secondary | ICD-10-CM | POA: Diagnosis not present

## 2024-07-25 DIAGNOSIS — M160 Bilateral primary osteoarthritis of hip: Secondary | ICD-10-CM | POA: Diagnosis not present

## 2024-07-25 DIAGNOSIS — M48061 Spinal stenosis, lumbar region without neurogenic claudication: Secondary | ICD-10-CM | POA: Diagnosis not present

## 2024-07-25 DIAGNOSIS — M1711 Unilateral primary osteoarthritis, right knee: Secondary | ICD-10-CM | POA: Diagnosis not present

## 2024-07-27 ENCOUNTER — Ambulatory Visit: Admitting: Student

## 2024-07-27 ENCOUNTER — Encounter: Payer: Self-pay | Admitting: Student

## 2024-07-27 VITALS — BP 129/68 | HR 57 | Temp 99.0°F | Ht 64.0 in | Wt 163.5 lb

## 2024-07-27 DIAGNOSIS — L821 Other seborrheic keratosis: Secondary | ICD-10-CM

## 2024-07-27 NOTE — Progress Notes (Signed)
    SUBJECTIVE:   CHIEF COMPLAINT / HPI:   Seborrheic keratosis Well-circumscribed, stable sized, lesion on upper back.  Recently becoming caught on clothing, sometimes bleeds and can be become painful during that time.  OBJECTIVE:   BP 129/68   Pulse (!) 57   Temp 99 F (37.2 C) (Oral)   Ht 5' 4 (1.626 m)   Wt 163 lb 8 oz (74.2 kg)   SpO2 100%   BMI 28.06 kg/m    General: NAD, well-appearing, well-nourished Respiratory: No respiratory distress, breathing comfortably, able to speak in full sentences Psych: Appropriate affect and mood Skin: warm and dry.  Approximate 3 x 4 mm, well-circumscribed, hyperpigmented, scaly, waxy, nontender, raised lesion on back consistent with seborrheic keratosis  Diagnosis: Seborrheic keratosis Procedure: Cryotherapy Location: Upper back  After discussion of the risks, benefits, and alternative therapies available, the patient elected to proceed. After obtaining written informed consent, the patient's identity, procedure, and site were verified during a time out prior to proceeding procedure. The lesion on the back were treated using liquid nitrogen spray gun for 6 second per cycle, 3 cycles total. The patient tolerated the procedure well and there were no immediate complications.  Patient was provided aftercare handout and advised to return if lesion(s) did not fully resolved.  ASSESSMENT/PLAN:   Assessment & Plan Seborrheic keratosis See procedure note.  Patient tolerated well. - Follow-up in 4 weeks if not resolved, consider additional cryo cycle   Gladis Church, DO [facility]

## 2024-07-27 NOTE — Patient Instructions (Signed)
 1. Wound Cleaning: Clean the treated area gently with mild soap and water. Avoid using alcohol or hydrogen peroxide as these can delay healing. 2. Moisturization: Apply a thin layer of petroleum jelly to keep the wound moist and prevent scabbing. This aligns with the American Heart Association and American [employer] guidelines, which recommend keeping wounds moist to prevent drying and promote healing 3. Dressing: Cover the wound with a clean, non-stick bandage. Change the dressing daily or more frequently if it becomes wet or dirty. 4. Monitoring for Infection: Watch for signs of infection such as increased redness, swelling, warmth, pain, or discharge. If any of these symptoms occur, seek medical attention promptly. 5. Pain Management: Over-the-counter pain relievers like acetaminophen  or ibuprofen can be used to manage discomfort. Cryotherapy can cause sustained vasoconstriction and tissue ischemia, which may contribute to pain. 6. Avoiding Trauma: Protect the treated area from trauma and avoid scratching or picking at the wound to prevent secondary infection and scarring

## 2024-08-02 DIAGNOSIS — I152 Hypertension secondary to endocrine disorders: Secondary | ICD-10-CM | POA: Diagnosis not present

## 2024-08-02 DIAGNOSIS — M1711 Unilateral primary osteoarthritis, right knee: Secondary | ICD-10-CM | POA: Diagnosis not present

## 2024-08-02 DIAGNOSIS — E1159 Type 2 diabetes mellitus with other circulatory complications: Secondary | ICD-10-CM | POA: Diagnosis not present

## 2024-08-02 DIAGNOSIS — M48061 Spinal stenosis, lumbar region without neurogenic claudication: Secondary | ICD-10-CM | POA: Diagnosis not present

## 2024-08-02 DIAGNOSIS — M160 Bilateral primary osteoarthritis of hip: Secondary | ICD-10-CM | POA: Diagnosis not present

## 2024-08-02 DIAGNOSIS — M19049 Primary osteoarthritis, unspecified hand: Secondary | ICD-10-CM | POA: Diagnosis not present

## 2024-08-08 ENCOUNTER — Ambulatory Visit (INDEPENDENT_AMBULATORY_CARE_PROVIDER_SITE_OTHER)

## 2024-08-08 ENCOUNTER — Telehealth: Payer: Self-pay

## 2024-08-08 VITALS — BP 130/72 | HR 65 | Ht 64.0 in | Wt 165.2 lb

## 2024-08-08 DIAGNOSIS — M5090 Cervical disc disorder, unspecified, unspecified cervical region: Secondary | ICD-10-CM | POA: Diagnosis not present

## 2024-08-08 DIAGNOSIS — L821 Other seborrheic keratosis: Secondary | ICD-10-CM | POA: Diagnosis not present

## 2024-08-08 DIAGNOSIS — R053 Chronic cough: Secondary | ICD-10-CM | POA: Diagnosis not present

## 2024-08-08 MED ORDER — GABAPENTIN 100 MG PO CAPS: ORAL_CAPSULE | ORAL | 0 refills | Status: DC

## 2024-08-08 MED ORDER — GABAPENTIN 100 MG PO CAPS: 100.0000 mg | ORAL_CAPSULE | Freq: Three times a day (TID) | ORAL | 3 refills | Status: AC

## 2024-08-08 MED ORDER — OLMESARTAN MEDOXOMIL 20 MG PO TABS: 20.0000 mg | ORAL_TABLET | Freq: Every day | ORAL | 3 refills | Status: AC

## 2024-08-08 NOTE — Patient Instructions (Addendum)
 Today we checked on the mole you had frozen, it looks good, continue keeping to clean and dry, using vaseline, and tylenol  as needed.   I refilled your gabapentin  today.   We are changing your BP medication today. Benazepril  can cause a cough, so we are starting you on Omlesartan (BENICAR ). So, do not take your Benazepril  any more. Once you have picked up your new medication you can start using it instead.   It is safe for you to take coricidin here and there.

## 2024-08-08 NOTE — Assessment & Plan Note (Signed)
 Intermittent pain. Patient takes gabapentin  as needed with good control. Refilled gabapentin  today.

## 2024-08-08 NOTE — Telephone Encounter (Signed)
 Called pharmacy and discontinued Benazepril .  .Rosaline JONELLE Pesa, CMA

## 2024-08-08 NOTE — Progress Notes (Signed)
    SUBJECTIVE:   CHIEF COMPLAINT / HPI:   Follow-up on Seborrheic keratosis   Patient had a seborrheic keratosis frozen with liquid nitrogen spray on 7/30. She has been keeping it clean and dry, applying vaseline, and taking tylenol  as needed as it has been a little sore since.   Patient also has a chronic dry cough that she states gets worse when the weather is damp. She says it only happens sometimes and that she takes a honey lemon cough drop. She has taken Robitussin in the past, but was told to stop it. She has also tried Coricidin and asks if she can take that again.   Patient is also requesting a gabapentin  refill. She takes as needed for nerve pain.   PERTINENT  PMH / PSH: T2DM well controlled, HTN, HLD  OBJECTIVE:   BP 130/72   Pulse 65   Ht 5' 4 (1.626 m)   Wt 165 lb 3.2 oz (74.9 kg)   SpO2 100%   BMI 28.36 kg/m   General: well appearing female, no acute distress  Cardiovascular: RRR, no m/r/g  Respiratory: CTAB, normal work of breathing on room air  Abdomen: soft, non-distended, non-tender to palpation, bowel sounds present  Extremities: warm, dry Skin: seborrheic keratosis freeze site on right lateral back about the level of the inferior angle of the scapula, central scab with erythematous border, dry, no drainage  Feet: no deformities, no ulcerations, no other skin breakdown bilaterally, no interdigital erythema or skin breakdown, intact to touch and monofilament bilaterally, 2+ posterior tibialis and dorsalis pulse intact bilaterally, full passive range of motion bilaterally    ASSESSMENT/PLAN:   Assessment & Plan Chronic cough Based on chart review with patient's history appears to have been going on for years. Intermittent dry cough. Patient has used Robitussin in the past and Coricidin. Switched patient's benazepril  to olmesartan  today. Informed patient she can use Coricidin intermittently as needed.  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 Patient takes gabapentin  as needed with good control. Refilled gabapentin  today.  Seborrheic keratosis Healing well. Informed patient to continue keeping the area clean and dry, apply vaseline, and use Tylenol  as needed.      Raguel KANDICE Lee, DO [facility]

## 2024-08-22 ENCOUNTER — Ambulatory Visit (INDEPENDENT_AMBULATORY_CARE_PROVIDER_SITE_OTHER): Payer: Medicare Other

## 2024-08-22 VITALS — Ht 64.0 in | Wt 159.0 lb

## 2024-08-22 DIAGNOSIS — Z0000 Encounter for general adult medical examination without abnormal findings: Secondary | ICD-10-CM

## 2024-08-22 NOTE — Patient Instructions (Signed)
 Heidi Wolf , Thank you for taking time out of your busy schedule to complete your Annual Wellness Visit with me. I enjoyed our conversation and look forward to speaking with you again next year. I, as well as your care team,  appreciate your ongoing commitment to your health goals. Please review the following plan we discussed and let me know if I can assist you in the future. Your Game plan/ To Do List    Referrals: If you haven't heard from the office you've been referred to, please reach out to them at the phone provided.   Follow up Visits: We will see or speak with you next year for your Next Medicare AWV with our clinical staff Have you seen your provider in the last 6 months (3 months if uncontrolled diabetes)? Yes  Clinician Recommendations:  Aim for 30 minutes of exercise or brisk walking, 6-8 glasses of water, and 5 servings of fruits and vegetables each day.       This is a list of the screenings recommended for you:  Health Maintenance  Topic Date Due   Yearly kidney health urinalysis for diabetes  Never done   Zoster (Shingles) Vaccine (1 of 2) Never done   Flu Shot  07/29/2024   COVID-19 Vaccine (4 - 2024-25 season) 08/31/2024*   Eye exam for diabetics  09/10/2024   Hemoglobin A1C  10/22/2024   Yearly kidney function blood test for diabetes  12/11/2024   Complete foot exam   08/08/2025   Medicare Annual Wellness Visit  08/22/2025   DTaP/Tdap/Td vaccine (2 - Td or Tdap) 12/08/2026   Pneumococcal Vaccine for age over 67  Completed   DEXA scan (bone density measurement)  Completed   HPV Vaccine  Aged Out   Meningitis B Vaccine  Aged Out  *Topic was postponed. The date shown is not the original due date.    Advanced directives: (Declined) Advance directive discussed with you today. Even though you declined this today, please call our office should you change your mind, and we can give you the proper paperwork for you to fill out. Advance Care Planning is important because  it:  [x]  Makes sure you receive the medical care that is consistent with your values, goals, and preferences  [x]  It provides guidance to your family and loved ones and reduces their decisional burden about whether or not they are making the right decisions based on your wishes.  Follow the link provided in your after visit summary or read over the paperwork we have mailed to you to help you started getting your Advance Directives in place. If you need assistance in completing these, please reach out to us  so that we can help you!  See attachments for Preventive Care and Fall Prevention Tips.

## 2024-08-22 NOTE — Progress Notes (Signed)
 Because this visit was a virtual/telehealth visit,  certain criteria was not obtained, such a blood pressure, CBG if applicable, and timed get up and go. Any medications not marked as taking were not mentioned during the medication reconciliation part of the visit. Any vitals not documented were not able to be obtained due to this being a telehealth visit or patient was unable to self-report a recent blood pressure reading due to a lack of equipment at home via telehealth. Vitals that have been documented are verbally provided by the patient.   Subjective:   Heidi Wolf is a 83 y.o. who presents for a Medicare Wellness preventive visit.  As a reminder, Annual Wellness Visits don't include a physical exam, and some assessments may be limited, especially if this visit is performed virtually. We may recommend an in-person follow-up visit with your provider if needed.  Visit Complete: Virtual I connected with  Heidi Wolf on 08/22/24 by a audio enabled telemedicine application and verified that I am speaking with the correct person using two identifiers.  Patient Location: Home  Provider Location: Home Office  I discussed the limitations of evaluation and management by telemedicine. The patient expressed understanding and agreed to proceed.  Vital Signs: Because this visit was a virtual/telehealth visit, some criteria may be missing or patient reported. Any vitals not documented were not able to be obtained and vitals that have been documented are patient reported.  VideoDeclined- This patient declined Librarian, academic. Therefore the visit was completed with audio only.  Persons Participating in Visit: Patient.  AWV Questionnaire: No: Patient Medicare AWV questionnaire was not completed prior to this visit.  Cardiac Risk Factors include: advanced age (>5men, >82 women);sedentary lifestyle     Objective:    Today's Vitals   08/22/24 1033   Weight: 159 lb (72.1 kg)  Height: 5' 4 (1.626 m)  PainSc: 0-No pain   Body mass index is 27.29 kg/m.     08/22/2024   10:38 AM 08/08/2024   10:16 AM 07/27/2024   10:14 AM 04/22/2024    2:43 PM 01/28/2024   10:30 AM 12/12/2023   11:16 AM 11/23/2023    1:46 PM  Advanced Directives  Does Patient Have a Medical Advance Directive? No No No No No No No  Would patient like information on creating a medical advance directive? No - Patient declined  No - Patient declined Yes (Inpatient - patient defers creating a medical advance directive at this time - Information given) No - Patient declined No - Patient declined     Current Medications (verified) Outpatient Encounter Medications as of 08/22/2024  Medication Sig   ACCU-CHEK AVIVA PLUS test strip USE TO CHECK BLOOD SUGAR UP TO ONCE DAILY AS NEEDED (Patient not taking: Reported on 08/08/2024)   Accu-Chek Softclix Lancets lancets Please use to check blood sugar once daily. (Patient not taking: Reported on 08/08/2024)   amLODipine  (NORVASC ) 10 MG tablet Take 1 tablet (10 mg total) by mouth in the morning.   aspirin  EC 81 MG tablet Take 1 tablet (81 mg total) by mouth daily.   CHOLECALCIFEROL  PO Take 1,000 Units by mouth in the morning.   Dihydroxyaluminum Sod Carb (ROLAIDS PO) Take 2 each by mouth daily as needed (heartburn). (Patient not taking: Reported on 08/08/2024)   famotidine  (PEPCID ) 10 MG tablet Take 1 tablet (10 mg total) by mouth 2 (two) times daily.   gabapentin  (NEURONTIN ) 100 MG capsule Take 1 capsule (100 mg total) by  mouth 3 (three) times daily.   isosorbide  mononitrate (IMDUR ) 60 MG 24 hr tablet Take 1 tablet (60 mg total) by mouth daily.   loratadine  (CLARITIN ) 10 MG tablet Take 1 tablet (10 mg total) by mouth daily.   LUMIGAN  0.01 % SOLN Place 1 drop into both eyes at bedtime.    nitroGLYCERIN  (NITROSTAT ) 0.4 MG SL tablet Place 1 tablet (0.4 mg total) under the tongue every 5 (five) minutes as needed for chest pain.   olmesartan   (BENICAR ) 20 MG tablet Take 1 tablet (20 mg total) by mouth at bedtime.   polyethylene glycol powder (GLYCOLAX /MIRALAX ) 17 GM/SCOOP powder Take 17 g by mouth daily as needed.   rosuvastatin  (CRESTOR ) 40 MG tablet Take 1 tablet (40 mg total) by mouth every evening.   tolnaftate (TINACTIN) 1 % external solution Apply 1 Application topically at bedtime. Absorbine Jr   trolamine salicylate (ASPERCREME) 10 % cream Apply 1 application  topically as needed for muscle pain. (Patient not taking: Reported on 08/08/2024)   Facility-Administered Encounter Medications as of 08/22/2024  Medication   aspirin  tablet 325 mg    Allergies (verified) Aspirin    History: Past Medical History:  Diagnosis Date   Allergy    Anemia    Anxiety    Arthritis    right hip (09/21/2014)   CAD (coronary artery disease)    PCI 90% diagonal stenosis witha DES July 2017.  Nonobstructive disease elsewhere.    Cataracts, both eyes    Colon polyps    Hyperplastic 2003   External hemorrhoid    GERD (gastroesophageal reflux disease)    Glaucoma    Hyperlipidemia    Hypertension    Internal hemorrhoid    Internal hemorrhoids without mention of complication 03/03/2012   Knee effusion, right 03/05/2020   Myocardial infarction [facility])    Palpitations 09/03/2016   Holter Aug 2017- PACs, no arrhythmia.    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   Severe Lumbar spinal stenosis 09/19/2022   Stenosing tenosynovitis of finger of left hand 05/06/2021   Trigger finger, acquired 05/06/2021   Trigger finger, left 03/08/2021   Type II diabetes mellitus (HCC)    type 2   Whiplash injury, acute, sequela 03/05/2020   Past Surgical History:  Procedure Laterality Date   ABDOMINAL HYSTERECTOMY     APPENDECTOMY     w/gallbladder   CARDIAC CATHETERIZATION  X 2   CARDIAC CATHETERIZATION N/A 07/03/2016   Procedure: Left Heart Cath and Coronary Angiography;  Surgeon: Salena Negri, MD;  Location: [facility]  INVASIVE CV LAB;  Service: Cardiovascular;  Laterality: N/A;   CARDIAC CATHETERIZATION N/A 07/03/2016   Procedure: Coronary Stent Intervention;  Surgeon: Candyce GORMAN Reek, MD;  Location: [facility] INVASIVE CV LAB;  Service: Cardiovascular;  Laterality: N/A;   CARDIAC CATHETERIZATION N/A 07/15/2016   Procedure: Left Heart Cath and Coronary Angiography;  Surgeon: Salena Negri, MD;  Location: [facility] INVASIVE CV LAB;  Service: Cardiovascular;  Laterality: N/A;   CHOLECYSTECTOMY     COLONOSCOPY     CORONARY ANGIOPLASTY WITH STENT PLACEMENT  06/2016   TUBAL LIGATION  1971   Family History  Problem Relation Age of Onset   Diabetes Mother    Arthritis Mother    CAD Mother 79       CABG   CAD Brother    CAD Sister 107       CABG   Stroke Brother    Stroke Daughter    Diabetes Maternal  Grandmother    Stroke Maternal Grandmother    Stroke Father    Heart failure Sister    Kidney failure Sister    Breast cancer Other    Colon cancer Neg Hx    Stomach cancer Neg Hx    Esophageal cancer Neg Hx    Pancreatic cancer Neg Hx    Rectal cancer Neg Hx    Social History   Socioeconomic History   Marital status: Legally Separated    Spouse name: Not on file   Number of children: 3   Years of education: Not on file   Highest education level: 12th grade  Occupational History   Occupation: retired  Tobacco Use   Smoking status: Never    Passive exposure: Never   Smokeless tobacco: Never  Vaping Use   Vaping status: Never Used  Substance and Sexual Activity   Alcohol use: No   Drug use: No   Sexual activity: Never  Other Topics Concern   Not on file  Social History Narrative   LIves with Tammy   Social Drivers of Health   Financial Resource Strain: Low Risk  (08/22/2024)   Overall Financial Resource Strain (CARDIA)    Difficulty of Paying Living Expenses: Not very hard  Food Insecurity: No Food Insecurity (08/22/2024)   Hunger Vital Sign    Worried About Running Out of Food in the Last Year:  Never true    Ran Out of Food in the Last Year: Never true  Transportation Needs: No Transportation Needs (08/22/2024)   PRAPARE - Administrator, Civil Service (Medical): No    Lack of Transportation (Non-Medical): No  Physical Activity: Patient Declined (08/22/2024)   Exercise Vital Sign    Days of Exercise per Week: Patient declined    Minutes of Exercise per Session: Patient declined  Stress: No Stress Concern Present (08/22/2024)   [employer] of Occupational Health - Occupational Stress Questionnaire    Feeling of Stress: Not at all  Social Connections: Unknown (08/22/2024)   Social Connection and Isolation Panel    Frequency of Communication with Friends and Family: More than three times a week    Frequency of Social Gatherings with Friends and Family: Patient declined    Attends Religious Services: Patient declined    Database administrator or Organizations: No    Attends Engineer, structural: Patient declined    Marital Status: Patient declined    Tobacco Counseling Counseling given: Not Answered    Clinical Intake:  Pre-visit preparation completed: Yes  Pain : No/denies pain Pain Score: 0-No pain     BMI - recorded: 27.29 Nutritional Status: BMI 25 -29 Overweight Nutritional Risks: None Diabetes: No  Lab Results  Component Value Date   HGBA1C 5.9 04/22/2024   HGBA1C 6.1 07/08/2023   HGBA1C 5.8 04/01/2023     How often do you need to have someone help you when you read instructions, pamphlets, or other written materials from your doctor or pharmacy?: 1 - Never What is the last grade level you completed in school?: High School Graduate  Interpreter Needed?: No  Information entered by :: Andrell Bergeson N. Phong Isenberg, LPN.   Activities of Daily Living     08/22/2024   10:40 AM  In your present state of health, do you have any difficulty performing the following activities:  Hearing? 0  Vision? 0  Difficulty concentrating or making  decisions? 0  Walking or climbing stairs? 1  Comment Uses a cane  Dressing or bathing? 0  Doing errands, shopping? 0  Preparing Food and eating ? N  Using the Toilet? N  In the past six months, have you accidently leaked urine? N  Do you have problems with loss of bowel control? N  Managing your Medications? N  Managing your Finances? N  Housekeeping or managing your Housekeeping? N    Patient Care Team: Lennie Raguel MATSU, DO as PCP - General (Family Medicine) Lavona Agent, MD as PCP - Cardiology (Cardiology) [facility] Kirsteins, Prentice BRAVO, MD as Consulting Physician (Physical Medicine and Rehabilitation) Octavia Bruckner, MD as Consulting Physician (Ophthalmology)  I have updated your Care Teams any recent Medical Services you may have received from other providers in the past year.     Assessment:   This is a routine wellness examination for Heidi Wolf.  Hearing/Vision screen Hearing Screening - Comments:: Adequate hearing, no hearing aids. Vision Screening - Comments:: Adequate vision with eyeglasses. Bruckner Octavia, MD.   Goals Addressed             This Visit's Progress    08/22/2024: Stay independent, stay strong in prayer because God is my healer.         Depression Screen     08/08/2024   10:15 AM 07/27/2024   10:14 AM 04/22/2024    2:44 PM 01/28/2024   10:38 AM 12/07/2023   10:41 AM 11/23/2023   10:44 AM 10/27/2023   11:54 AM  PHQ 2/9 Scores  PHQ - 2 Score 0 0 0 0 0 0 0  PHQ- 9 Score 0 0 1 4 2 1      Fall Risk     08/22/2024   10:38 AM 08/08/2024   10:17 AM 07/27/2024   10:14 AM 04/22/2024    2:43 PM 01/28/2024   10:30 AM  Fall Risk   Falls in the past year? 0 0 0 0 0  Number falls in past yr: 0 0 0 0 0  Injury with Fall? 0  0 0 0  Risk for fall due to : No Fall Risks      Follow up Falls evaluation completed        MEDICARE RISK AT HOME:  Medicare Risk at Home Any stairs in or around the home?: Yes (3 steps at front entry, 1  step at back entry) If so, are there any without handrails?: No Home free of loose throw rugs in walkways, pet beds, electrical cords, etc?: Yes Adequate lighting in your home to reduce risk of falls?: Yes Life alert?: No (Stays with daughter) Use of a cane, [place] or w/c?: Yes (Cane) Grab bars in the bathroom?: No Shower chair or bench in shower?: Yes Elevated toilet seat or a handicapped toilet?: Yes (Higher Toilet with handles on each side)  TIMED UP AND GO:  Was the test performed?  No  Cognitive Function: Declined/Normal: No cognitive concerns noted by patient or family. Patient alert, oriented, able to answer questions appropriately and recall recent events. No signs of memory loss or confusion.    08/22/2024   10:39 AM  MMSE - Mini Mental State Exam  Not completed: Unable to complete        08/22/2024   10:35 AM 08/17/2023    1:01 PM  6CIT Screen  What Year? 0 points 0 points  What month? 0 points 0 points  What time? 0 points 0 points  Count back from 20 0 points 0 points  Months in reverse 0 points  2 points  Repeat phrase 0 points 0 points  Total Score 0 points 2 points    Immunizations Immunization History  Administered Date(s) Administered   Fluad Quad(high Dose 65+) 11/07/2020, 10/02/2021, 09/19/2022   Fluad Trivalent(High Dose 65+) 01/28/2024   INFLUENZA, HIGH DOSE SEASONAL PF 10/16/2017   Influenza,inj,Quad PF,6+ Mos 09/22/2016, 08/20/2018, 10/05/2019   PFIZER(Purple Top)SARS-COV-2 Vaccination 02/25/2020, 03/24/2020   Pfizer(Comirnaty)Fall Seasonal Vaccine 12 years and older 03/18/2023   Pneumococcal Conjugate-13 09/22/2016   Pneumococcal Polysaccharide-23 09/25/2011   Tdap 12/08/2016    Screening Tests Health Maintenance  Topic Date Due   Diabetic kidney evaluation - Urine ACR  Never done   Zoster Vaccines- Shingrix  (1 of 2) Never done   INFLUENZA VACCINE  07/29/2024   COVID-19 Vaccine (4 - 2024-25 season) 08/31/2024 (Originally 08/30/2023)    OPHTHALMOLOGY EXAM  09/10/2024   HEMOGLOBIN A1C  10/22/2024   Diabetic kidney evaluation - eGFR measurement  12/11/2024   FOOT EXAM  08/08/2025   Medicare Annual Wellness (AWV)  08/22/2025   DTaP/Tdap/Td (2 - Td or Tdap) 12/08/2026   Pneumococcal Vaccine: 50+ Years  Completed   DEXA SCAN  Completed   HPV VACCINES  Aged Out   Meningococcal B Vaccine  Aged Out    Health Maintenance  Health Maintenance Due  Topic Date Due   Diabetic kidney evaluation - Urine ACR  Never done   Zoster Vaccines- Shingrix  (1 of 2) Never done   INFLUENZA VACCINE  07/29/2024   Health Maintenance Items Addressed: Yes Patient aware of current care gaps.  Immunization record was verified by [employer].  Patient is due for the following care gaps: Flu, Shingrix  and Diabetic Kidney Evaluation.  Additional Screening:  Vision Screening: Recommended annual ophthalmology exams for early detection of glaucoma and other disorders of the eye. Would you like a referral to an eye doctor? No    Dental Screening: Recommended annual dental exams for proper oral hygiene  Community Resource Referral / Chronic Care Management: CRR required this visit?  No   CCM required this visit?  No   Plan:    I have personally reviewed and noted the following in the patient's chart:   Medical and social history Use of alcohol, tobacco or illicit drugs  Current medications and supplements including opioid prescriptions. Patient is not currently taking opioid prescriptions. Functional ability and status Nutritional status Physical activity Advanced directives List of other physicians Hospitalizations, surgeries, and ER visits in previous 12 months Vitals Screenings to include cognitive, depression, and falls Referrals and appointments  In addition, I have reviewed and discussed with patient certain preventive protocols, quality metrics, and best practice recommendations. A written personalized care plan for preventive services  as well as general preventive health recommendations were provided to patient.   Heidi LOISE Fuller, LPN   [id number]/[id number]   After Visit Summary: (MyChart) Due to this being a telephonic visit, the after visit summary with patients personalized plan was offered to patient via MyChart   Notes: Patient aware of current care gaps.  Immunization record was verified by [employer].  Patient is due for the following care gaps: Flu, Shingrix  and Diabetic Kidney Evaluation.

## 2024-09-01 ENCOUNTER — Ambulatory Visit
Admission: RE | Admit: 2024-09-01 | Discharge: 2024-09-01 | Disposition: A | Discharge status: Home / Self Care | Source: Ambulatory Visit | Attending: Family Medicine | Admitting: Family Medicine

## 2024-09-01 DIAGNOSIS — Z1231 Encounter for screening mammogram for malignant neoplasm of breast: Secondary | ICD-10-CM

## 2024-10-12 NOTE — Progress Notes (Unsigned)
 Cardiology Office Note:   Date:  10/13/2024  ID:  Heidi Wolf, DOB 1941/12/24, MRN [id number] PCP: Lennie Raguel MATSU, DO  [facility] Providers Cardiologist:  Lynwood Schilling, MD {  History of Present Illness:   Heidi Wolf is a 83 y.o. female who presents for follow up of CAD s/p Dx PCI/ DES 07/03/16. Re look 07/15/16 showed patent stent. She had no other significant CAD and LV wall motion was normal at cath. She complained of intermittent palpitations. A Holter monitor showed PACs and her beta blocker was increased.  Since I last saw her she was in the hospital with chest pain.  She had negative cardiac enzymes and her echo showed no significant wall motion abnormalities.  She did have a suggestion of some hypertrophy of the apical segment.   She had an echo with evidence of apical hypertrophy.  This was unchanged from previous.   She was in the hospital in Feb 2025 with  chest pain.  Perfusion study was not high risk..  She did have a follow up echo post discharge with apical hypertrophy.  Of note she was taken off the beta-blocker at that point in time because of low heart rates.    She presents for follow up.  She did have a perfusion study earlier this year that showed a small defect in the anterior apical wall.  However, this was felt to be low risk and she was managed medically.  She is actually done well since then.  She gets around there is a walk to her mailbox.  She uses her cane to be careful.  She denies any new cardiovascular symptoms.  She is limited somewhat by knee pain.  She has some back pain.  However, she is not describing any new chest pressure, neck or arm discomfort.  She has had no new palpitations, presyncope or syncope.  She is not describing shortness of breath, PND or orthopnea.  She has not required any sublingual nitroglycerin .  ROS: As stated in the HPI and negative for all other systems.  Studies Reviewed:    EKG:    NA  Risk  Assessment/Calculations:              Physical Exam:   VS:  BP (!) 137/58 (BP Location: Left Arm, Patient Position: Sitting, Cuff Size: Normal)   Pulse 67   Ht 5' 5 (1.651 m)   Wt 165 lb (74.8 kg)   SpO2 98%   BMI 27.46 kg/m    Wt Readings from Last 3 Encounters:  10/13/24 165 lb (74.8 kg)  08/22/24 159 lb (72.1 kg)  08/08/24 165 lb 3.2 oz (74.9 kg)     GEN: Well nourished, well developed in no acute distress NECK: No JVD; No carotid bruits CARDIAC: RRR, very soft brief apical systolic murmur nonradiating, no diastolic murmurs, rubs, gallops RESPIRATORY:  Clear to auscultation without rales, wheezing or rhonchi  ABDOMEN: Soft, non-tender, non-distended EXTREMITIES:  No edema; No deformity   ASSESSMENT AND PLAN:   CAD:  The patient has no new sypmtoms.  No further cardiovascular testing is indicated.  We will continue with aggressive risk reduction and meds as listed.   ABNORMAL ECHO:   She has some apical hypertrophy.  She has no high risk findings.  No change in therapy.  HTN:   The blood pressure is at target.  She will continue the meds as listed.    DM: A1c was 5.9.  No change in therapy.  HYPERLIPIDEMIA:     LDL was 40.  She will continue the meds as listed.     Follow up with me in 1 year.  Signed, Lynwood Schilling, MD

## 2024-10-13 ENCOUNTER — Encounter: Payer: Self-pay | Admitting: Cardiology

## 2024-10-13 ENCOUNTER — Ambulatory Visit: Attending: Cardiology | Admitting: Cardiology

## 2024-10-13 VITALS — BP 137/58 | HR 67 | Ht 65.0 in | Wt 165.0 lb

## 2024-10-13 DIAGNOSIS — I2510 Atherosclerotic heart disease of native coronary artery without angina pectoris: Secondary | ICD-10-CM | POA: Diagnosis not present

## 2024-10-13 DIAGNOSIS — E785 Hyperlipidemia, unspecified: Secondary | ICD-10-CM | POA: Diagnosis not present

## 2024-10-13 DIAGNOSIS — E118 Type 2 diabetes mellitus with unspecified complications: Secondary | ICD-10-CM | POA: Diagnosis not present

## 2024-10-13 DIAGNOSIS — I10 Essential (primary) hypertension: Secondary | ICD-10-CM | POA: Insufficient documentation

## 2024-10-13 NOTE — Patient Instructions (Signed)
 Medication Instructions:  Your physician recommends that you continue on your current medications as directed. Please refer to the Current Medication list given to you today.  *If you need a refill on your cardiac medications before your next appointment, please call your pharmacy*  Lab Work: None.  If you have labs (blood work) drawn today and your tests are completely normal, you will receive your results only by: MyChart Message (if you have MyChart) OR A paper copy in the mail If you have any lab test that is abnormal or we need to change your treatment, we will call you to review the results.  Testing/Procedures: None.  Follow-Up: At [facility], you and your health needs are our priority.  As part of our continuing mission to provide you with exceptional heart care, our providers are all part of one team.  This team includes your primary Cardiologist (physician) and Advanced Practice Providers or APPs (Physician Assistants and Nurse Practitioners) who all work together to provide you with the care you need, when you need it.  Your next appointment:   1 year(s)  Provider:   Lynwood Schilling, MD

## 2024-10-26 DIAGNOSIS — H25813 Combined forms of age-related cataract, bilateral: Secondary | ICD-10-CM | POA: Diagnosis not present

## 2024-10-26 DIAGNOSIS — H04123 Dry eye syndrome of bilateral lacrimal glands: Secondary | ICD-10-CM | POA: Diagnosis not present

## 2024-10-26 DIAGNOSIS — H1045 Other chronic allergic conjunctivitis: Secondary | ICD-10-CM | POA: Diagnosis not present

## 2024-10-26 DIAGNOSIS — H401131 Primary open-angle glaucoma, bilateral, mild stage: Secondary | ICD-10-CM | POA: Diagnosis not present

## 2024-11-10 ENCOUNTER — Ambulatory Visit

## 2024-11-10 VITALS — BP 142/69 | HR 66 | Ht 65.0 in | Wt 165.5 lb

## 2024-11-10 DIAGNOSIS — Z23 Encounter for immunization: Secondary | ICD-10-CM

## 2024-11-10 DIAGNOSIS — E1159 Type 2 diabetes mellitus with other circulatory complications: Secondary | ICD-10-CM | POA: Diagnosis not present

## 2024-11-10 DIAGNOSIS — Z7189 Other specified counseling: Secondary | ICD-10-CM

## 2024-11-10 LAB — POCT GLYCOSYLATED HEMOGLOBIN (HGB A1C): HbA1c, POC (controlled diabetic range): 6.2 % (ref 0.0–7.0)

## 2024-11-10 NOTE — Assessment & Plan Note (Addendum)
 A1c 6.2 today. - Microalbumin creatinine ratio collected today - Follow-up in 6 months

## 2024-11-10 NOTE — Progress Notes (Signed)
    SUBJECTIVE:   CHIEF COMPLAINT / HPI: diabetes f/u  Presents today for diabetes follow-up.  She states she has been doing well otherwise.  She has no complaints today.  She says since stopping her benazepril  that she has not had a cough any longer.  Her diastolic blood pressure was a little on the lower side today.  However, she denies feeling dizzy and/or lightheaded.   PERTINENT  PMH / PSH: T2DM, HTN, hypertrophic cardiomegaly  OBJECTIVE:   BP (!) 142/69   Pulse 66   Ht 5' 5 (1.651 m)   Wt 165 lb 8 oz (75.1 kg)   SpO2 99%   BMI 27.54 kg/m   General: Well-appearing elderly female, NAD HEENT: Ears with cerumen bilaterally, TMs normal, nasal passages clear, throat nonerythematous, protrusion over midline hard palate (patient states this is always been there) Cardiovascular: RRR, 2/6 systolic murmur Respiratory: CTAB, normal work of breathing on room air Abdomen: Bowel sounds present, nondistended, nontender palpation  ASSESSMENT/PLAN:   Assessment & Plan Type 2 diabetes mellitus with other circulatory complication, without long-term current use of insulin  (HCC) A1c 6.2 today. - Microalbumin creatinine ratio collected today - Follow-up in 6 months Encounter for immunization Flu vaccine administered today. Advance care planning ACP document given to patient today.  Advised her to bring it back at her next appointment to be notarized.     Raguel KANDICE Lee, DO [place] [facility]

## 2024-11-10 NOTE — Patient Instructions (Addendum)
 Your A1c was 6.2 today which is good. Please come back and see us  in 6 months so we can re-check.   I have given you an Advance Care Planning document. You can take this home and fill it out, you can bring it to your next visit and get is notarized here in clinic. This is so we can have your medical wishes documented.

## 2024-11-11 LAB — MICROALBUMIN / CREATININE URINE RATIO
Creatinine, Urine: 198.5 mg/dL
Microalb/Creat Ratio: 11 mg/g{creat} (ref 0–29)
Microalbumin, Urine: 22.8 ug/mL

## 2024-11-16 ENCOUNTER — Ambulatory Visit: Payer: Self-pay

## 2025-01-18 ENCOUNTER — Emergency Department (HOSPITAL_COMMUNITY)

## 2025-01-18 ENCOUNTER — Observation Stay (HOSPITAL_COMMUNITY)
Admission: EM | Admit: 2025-01-18 | Discharge: 2025-01-19 | Disposition: A | Discharge status: Home Health | Attending: Family Medicine | Admitting: Family Medicine

## 2025-01-18 ENCOUNTER — Inpatient Hospital Stay (HOSPITAL_COMMUNITY)

## 2025-01-18 ENCOUNTER — Encounter (HOSPITAL_COMMUNITY): Payer: Self-pay | Admitting: Family Medicine

## 2025-01-18 ENCOUNTER — Other Ambulatory Visit: Payer: Self-pay | Admitting: Cardiology

## 2025-01-18 ENCOUNTER — Other Ambulatory Visit: Payer: Self-pay

## 2025-01-18 DIAGNOSIS — R2689 Other abnormalities of gait and mobility: Secondary | ICD-10-CM | POA: Diagnosis not present

## 2025-01-18 DIAGNOSIS — R0789 Other chest pain: Principal | ICD-10-CM | POA: Insufficient documentation

## 2025-01-18 DIAGNOSIS — R2681 Unsteadiness on feet: Secondary | ICD-10-CM | POA: Insufficient documentation

## 2025-01-18 DIAGNOSIS — I214 Non-ST elevation (NSTEMI) myocardial infarction: Principal | ICD-10-CM | POA: Insufficient documentation

## 2025-01-18 DIAGNOSIS — I10 Essential (primary) hypertension: Secondary | ICD-10-CM | POA: Insufficient documentation

## 2025-01-18 DIAGNOSIS — R079 Chest pain, unspecified: Secondary | ICD-10-CM | POA: Diagnosis present

## 2025-01-18 DIAGNOSIS — I2510 Atherosclerotic heart disease of native coronary artery without angina pectoris: Secondary | ICD-10-CM | POA: Diagnosis not present

## 2025-01-18 DIAGNOSIS — E119 Type 2 diabetes mellitus without complications: Secondary | ICD-10-CM | POA: Diagnosis not present

## 2025-01-18 DIAGNOSIS — Z79899 Other long term (current) drug therapy: Secondary | ICD-10-CM | POA: Diagnosis not present

## 2025-01-18 DIAGNOSIS — Z789 Other specified health status: Secondary | ICD-10-CM | POA: Diagnosis not present

## 2025-01-18 DIAGNOSIS — M6281 Muscle weakness (generalized): Secondary | ICD-10-CM | POA: Diagnosis not present

## 2025-01-18 LAB — BASIC METABOLIC PANEL WITH GFR
Anion gap: 12 (ref 5–15)
BUN: 19 mg/dL (ref 8–23)
CO2: 23 mmol/L (ref 22–32)
Calcium: 9.6 mg/dL (ref 8.9–10.3)
Chloride: 101 mmol/L (ref 98–111)
Creatinine, Ser: 1.05 mg/dL — ABNORMAL HIGH (ref 0.44–1.00)
GFR, Estimated: 52 mL/min — ABNORMAL LOW
Glucose, Bld: 125 mg/dL — ABNORMAL HIGH (ref 70–99)
Potassium: 4 mmol/L (ref 3.5–5.1)
Sodium: 136 mmol/L (ref 135–145)

## 2025-01-18 LAB — ECHOCARDIOGRAM COMPLETE
AR max vel: 2.11 cm2
AV Area VTI: 1.91 cm2
AV Area mean vel: 1.99 cm2
AV Mean grad: 5 mmHg
AV Peak grad: 9.9 mmHg
Ao pk vel: 1.57 m/s
Area-P 1/2: 3.6 cm2
Height: 65 in
MV M vel: 4.84 m/s
MV Peak grad: 93.7 mmHg
S' Lateral: 2.8 cm
Weight: 2645.52 [oz_av]

## 2025-01-18 LAB — CBC
HCT: 43.7 % (ref 36.0–46.0)
Hemoglobin: 14.2 g/dL (ref 12.0–15.0)
MCH: 27 pg (ref 26.0–34.0)
MCHC: 32.5 g/dL (ref 30.0–36.0)
MCV: 83.2 fL (ref 80.0–100.0)
Platelets: 277 K/uL (ref 150–400)
RBC: 5.25 MIL/uL — ABNORMAL HIGH (ref 3.87–5.11)
RDW: 14.6 % (ref 11.5–15.5)
WBC: 8.5 K/uL (ref 4.0–10.5)
nRBC: 0 % (ref 0.0–0.2)

## 2025-01-18 LAB — TROPONIN T, HIGH SENSITIVITY
Troponin T High Sensitivity: 28 ng/L — ABNORMAL HIGH (ref 0–19)
Troponin T High Sensitivity: 31 ng/L — ABNORMAL HIGH (ref 0–19)

## 2025-01-18 LAB — HEPARIN LEVEL (UNFRACTIONATED): Heparin Unfractionated: 0.33 [IU]/mL (ref 0.30–0.70)

## 2025-01-18 LAB — PROTIME-INR
INR: 0.9 (ref 0.8–1.2)
Prothrombin Time: 12.6 s (ref 11.4–15.2)

## 2025-01-18 MED ORDER — AMLODIPINE BESYLATE 10 MG PO TABS
10.0000 mg | ORAL_TABLET | Freq: Every morning | ORAL | Status: DC
  Administered 2025-01-18 – 2025-01-19 (×2): 10 mg via ORAL
  Filled 2025-01-18: qty 2
  Filled 2025-01-18: qty 1

## 2025-01-18 MED ORDER — LATANOPROST 0.005 % OP SOLN
1.0000 [drp] | Freq: Every day | OPHTHALMIC | Status: DC
  Administered 2025-01-18: 1 [drp] via OPHTHALMIC
  Filled 2025-01-18: qty 2.5

## 2025-01-18 MED ORDER — NITROGLYCERIN 0.4 MG SL SUBL
0.4000 mg | SUBLINGUAL_TABLET | Freq: Once | SUBLINGUAL | Status: AC
  Administered 2025-01-18: 0.4 mg via SUBLINGUAL
  Filled 2025-01-18: qty 1

## 2025-01-18 MED ORDER — PERFLUTREN LIPID MICROSPHERE
1.0000 mL | INTRAVENOUS | Status: AC | PRN
  Administered 2025-01-18: 2 mL via INTRAVENOUS

## 2025-01-18 MED ORDER — ACETAMINOPHEN 650 MG RE SUPP: 650.0000 mg | Freq: Four times a day (QID) | RECTAL | Status: DC | PRN

## 2025-01-18 MED ORDER — HEPARIN BOLUS VIA INFUSION
4000.0000 [IU] | Freq: Once | INTRAVENOUS | Status: AC
  Administered 2025-01-18: 4000 [IU] via INTRAVENOUS
  Filled 2025-01-18: qty 4000

## 2025-01-18 MED ORDER — FAMOTIDINE 20 MG PO TABS
10.0000 mg | ORAL_TABLET | Freq: Two times a day (BID) | ORAL | Status: DC
  Administered 2025-01-18 – 2025-01-19 (×2): 10 mg via ORAL
  Filled 2025-01-18 (×2): qty 1

## 2025-01-18 MED ORDER — ROSUVASTATIN CALCIUM 20 MG PO TABS
40.0000 mg | ORAL_TABLET | Freq: Every evening | ORAL | Status: DC
  Administered 2025-01-18: 40 mg via ORAL
  Filled 2025-01-18: qty 2

## 2025-01-18 MED ORDER — ONDANSETRON HCL 4 MG PO TABS: 4.0000 mg | ORAL_TABLET | Freq: Four times a day (QID) | ORAL | Status: DC | PRN

## 2025-01-18 MED ORDER — ASPIRIN 81 MG PO CHEW
324.0000 mg | CHEWABLE_TABLET | Freq: Once | ORAL | Status: AC
  Administered 2025-01-18: 324 mg via ORAL
  Filled 2025-01-18: qty 4

## 2025-01-18 MED ORDER — ACETAMINOPHEN 325 MG PO TABS
650.0000 mg | ORAL_TABLET | Freq: Four times a day (QID) | ORAL | Status: DC | PRN
  Administered 2025-01-18 – 2025-01-19 (×2): 650 mg via ORAL
  Filled 2025-01-18 (×2): qty 2

## 2025-01-18 MED ORDER — SODIUM CHLORIDE 0.9% FLUSH
3.0000 mL | Freq: Two times a day (BID) | INTRAVENOUS | Status: DC
  Administered 2025-01-18 – 2025-01-19 (×3): 3 mL via INTRAVENOUS

## 2025-01-18 MED ORDER — ASPIRIN 81 MG PO TBEC
81.0000 mg | DELAYED_RELEASE_TABLET | Freq: Every day | ORAL | Status: DC
  Administered 2025-01-19: 81 mg via ORAL
  Filled 2025-01-18: qty 1

## 2025-01-18 MED ORDER — ISOSORBIDE MONONITRATE ER 60 MG PO TB24
60.0000 mg | ORAL_TABLET | Freq: Every day | ORAL | Status: DC
  Administered 2025-01-19: 60 mg via ORAL
  Filled 2025-01-18: qty 1
  Filled 2025-01-18: qty 2

## 2025-01-18 MED ORDER — LORATADINE 10 MG PO TABS
10.0000 mg | ORAL_TABLET | Freq: Every day | ORAL | Status: DC
  Administered 2025-01-19: 10 mg via ORAL
  Filled 2025-01-18: qty 1

## 2025-01-18 MED ORDER — ONDANSETRON HCL 4 MG/2ML IJ SOLN: 4.0000 mg | Freq: Four times a day (QID) | INTRAMUSCULAR | Status: DC | PRN

## 2025-01-18 MED ORDER — HEPARIN (PORCINE) 25000 UT/250ML-% IV SOLN
850.0000 [IU]/h | INTRAVENOUS | Status: DC
  Administered 2025-01-18: 850 [IU]/h via INTRAVENOUS
  Filled 2025-01-18 (×2): qty 250

## 2025-01-18 MED ORDER — IRBESARTAN 300 MG PO TABS
150.0000 mg | ORAL_TABLET | Freq: Every day | ORAL | Status: DC
  Administered 2025-01-19: 150 mg via ORAL
  Filled 2025-01-18 (×2): qty 1

## 2025-01-18 NOTE — ED Notes (Signed)
Patient transported to Echo lab.

## 2025-01-18 NOTE — H&P (Signed)
 "    Hospital Admission History and Physical Service Pager: [phone]  Patient name: Heidi Wolf Medical record number: [id number] Date of Birth: Jan 20, 1941 Age: 84 y.o. Gender: female  Primary Care Provider: Lennie Raguel MATSU, DO Consultants: Cardiology Code Status: Full code Preferred Emergency Contact: Prefers son Norleen as first contact but all listed are ok Contact Information     Name Relation Home Work [place] Daughter [phone]        Other Contacts     Name Relation Home Work Mobile   [place] Son   [phone]   Bryonna, Sundby   [phone]       Chief Complaint: Chest pressure radiating to left side of neck and left shoulder  Differential and Medical Decision Making:  Heidi Wolf is a 84 y.o. female presenting with chest pain/pressure radiating to left shoulder and neck .   Concern for ACS/NSTEMI given history of CAD s/p PCI in 2017, classic symptoms of ACS, elevated initial troponin to 28. She was given aspirin  and nitroglycerin  and started on heparin  drip.  Cardiology was consulted in the ED and recommend admission.   Chest x-ray with no acute process.  EKG with no significant ST elevation or depression however does have T wave inversion in aVL although this is not new.  Most recent echo in 2024 with apical hypertrophy, LVEF 70 to 75% with hyperdynamic function.  Grade 1 diastolic dysfunction.  Currently with normal O2 sat on room air, not requiring oxygen. Lower suspicion for PE given lack of lower extremity swelling, recent immobilization, no history of cancer, no sinus tachycardia.    Assessment & Plan NSTEMI (non-ST elevated myocardial infarction) [facility]) CAD- S/P PCI July 2017 -Admit to FMTS, attending Dr. Donzetta, progressive -Cardiology consulted in ED, appreciate recommendations - S/p aspirin , nitroglycerin  - Continue heparin  drip - ASA daily - Continue home Imdur  - Ordered echo Chronic health  problem Hyperlipidemia: Continue Crestor  Hypertension: Continue amlodipine  Radiculopathy: Continue home gabapentin  Glaucoma: continue home eye drops GERD: home pepcid  Continued home cholecalciferol  and claritin     FEN/GI: NPO pending cards eval VTE Prophylaxis: heparin  gtt   Disposition: progressive  History of Present Illness:  Heidi Wolf is a 84 y.o. female presenting with chest pressure  Last night started having chest pressure This morning her pain started radiating to her left shoulder and neck Also felt a bit nauseous and dizzy. Some numbness in her arms. She has a history of heart attack and current symptoms feel different (more pressure vs pain) Also had some mild SOB but this has resolved  Currently reports some improvement in symptoms but still has some persistent chest pressure. Has not received asa, ntg or heparin  yet. She did not take any asa or ntg earlier. Reports compliance with other meds.  In the ED, she was started on aspirin  nitroglycerin  and heparin  drip.  Cardiology was consulted  Review Of Systems: Per HPI   Pertinent Past Medical History: CAD s/p PCI/DES in July 2017.  Follows with Dr. Lavona History of PACs on beta-blocker Hypertension T2DM Hyperlipidemia GERD Glaucoma Lumbar spinal stenosis   Remainder reviewed in history tab.   Pertinent Past Surgical History: Hysterectomy appendectomy cardiac cath 2017 with DES Cholecystectomy  Remainder reviewed in history tab.  Pertinent Social History: Tobacco use: No Alcohol use: No Other Substance use: No Lives with daughter Cammie  Pertinent Family History: Mother with diabetes, CAD Father with stroke Sister CAD Sister heart failure, kidney failure Brother CAD Brother stroke Daughter stroke  Niece with breast cancer    Important Outpatient Medications: Norvasc  10 mg daily Aspirin  81 mg daily Cholecalciferol  1000 units daily Pepcid  10 mg twice daily Gabapentin  100 mg 3  times daily - takes as needed Imdur  60 mg daily Claritin  10 mg daily Lumigan  eyedrops nightly Benicar  20 mg daily Crestor  40 mg nightly    Objective: BP (!) 143/71   Pulse 80   Temp 98.4 F (36.9 C)   Resp 16   Ht 5' 5 (1.651 m)   Wt 75 kg   SpO2 95%   BMI 27.51 kg/m  Exam: General: NAD, pleasant, able to participate in exam Cardiac: Heart sounds distant, RRR. No JVP Respiratory: CTAB, normal WOB Abdomen: soft, non-tender, non-distended, normoactive bowel sounds Extremities: warm and well perfused, no edema or cyanosis, no calf tenderness Skin: warm and dry, no rashes noted Neuro: alert, no obvious focal deficits, speech normal Psych: Normal affect and mood   Labs:  CBC BMET  Recent Labs  Lab 01/18/25 0631  WBC 8.5  HGB 14.2  HCT 43.7  PLT 277   Recent Labs  Lab 01/18/25 0631  NA 136  K 4.0  CL 101  CO2 23  BUN 19  CREATININE 1.05*  GLUCOSE 125*  CALCIUM  9.6     PT/INR 12.6/0.9  EKG: NSR no significant ST changes, does have some TWI though these were present on prior EKG   Imaging Studies Performed:  CXR: IMPRESSION: No active cardiopulmonary disease.     Electronically Signed   By: Camellia Candle M.D.   On: 01/18/2025 06:54  Reviewed and agree with radiologist interpretation   Romelle Booty, MD 01/18/2025, 8:10 AM PGY-3, [facility]  FPTS Intern pager: [phone], text pages welcome Secure chat group [facility] Teaching Service    "

## 2025-01-18 NOTE — Assessment & Plan Note (Addendum)
-  Admit to FMTS, attending Dr. Donzetta, progressive -Cardiology consulted in ED, appreciate recommendations - S/p aspirin , nitroglycerin  - Continue heparin  drip - ASA daily - Continue home Imdur  - Ordered echo

## 2025-01-18 NOTE — Assessment & Plan Note (Signed)
 Hyperlipidemia: Continue Crestor  Hypertension: Continue amlodipine  Radiculopathy: Continue home gabapentin  Glaucoma: continue home eye drops GERD: home pepcid  Continued home cholecalciferol  and claritin 

## 2025-01-18 NOTE — ED Provider Notes (Signed)
 " [place] [facility] Provider Note   CSN: [id number] Arrival date & time: 01/18/25  9394     Patient presents with: Chest Pain   Heidi Wolf is a 84 y.o. female.   84 year old female presents today for concern of chest pressure that radiates into the left side of her neck, and left shoulder.  She does have history of CAD and has had a PCI placed in July 2017.  She follows with cardiology.  Reports compliance with all of her home medicines.  Has not taken her home medicines today.  Pain started last night however pain started radiating this morning and she also had some lightheadedness with this.  Endorses a mild pressure currently.  The history is provided by the patient. No language interpreter was used.       Prior to Admission medications  Medication Sig Start Date End Date Taking? Authorizing Provider  ACCU-CHEK AVIVA PLUS test strip USE TO CHECK BLOOD SUGAR UP TO ONCE DAILY AS NEEDED 09/02/22   Jennelle Riis, MD  Accu-Chek Softclix Lancets lancets Please use to check blood sugar once daily. 09/30/22   Sowell, Brandon, MD  amLODipine  (NORVASC ) 10 MG tablet Take 1 tablet (10 mg total) by mouth in the morning. 01/08/24   Lavona Agent, MD  aspirin  EC 81 MG tablet Take 1 tablet (81 mg total) by mouth daily. 07/04/16   Claudene Pacific, MD  CHOLECALCIFEROL  PO Take 1,000 Units by mouth in the morning.    [provider]  Dihydroxyaluminum Sod Carb (ROLAIDS PO) Take 2 each by mouth daily as needed (heartburn).    [provider]  famotidine  (PEPCID ) 10 MG tablet Take 1 tablet (10 mg total) by mouth 2 (two) times daily. 01/10/22   Malvina Ellen, MD  gabapentin  (NEURONTIN ) 100 MG capsule Take 1 capsule (100 mg total) by mouth 3 (three) times daily. 08/08/24   Lennie Raguel MATSU, DO  isosorbide  mononitrate (IMDUR ) 60 MG 24 hr tablet Take 1 tablet (60 mg total) by mouth daily. 01/18/24   Lavona Agent, MD  loratadine  (CLARITIN ) 10 MG  tablet Take 1 tablet (10 mg total) by mouth daily. 11/23/23   Waddell Sluder, PA-C  LUMIGAN  0.01 % SOLN Place 1 drop into both eyes at bedtime.  04/13/14   [provider]  nitroGLYCERIN  (NITROSTAT ) 0.4 MG SL tablet Place 1 tablet (0.4 mg total) under the tongue every 5 (five) minutes as needed for chest pain. 01/08/24   Lavona Agent, MD  olmesartan  (BENICAR ) 20 MG tablet Take 1 tablet (20 mg total) by mouth at bedtime. 08/08/24   Baker, Amelia G, DO  polyethylene glycol powder (GLYCOLAX /MIRALAX ) 17 GM/SCOOP powder Take 17 g by mouth daily as needed. 11/07/20   Jarrett Lucie SAILOR, DO  rosuvastatin  (CRESTOR ) 40 MG tablet Take 1 tablet (40 mg total) by mouth every evening. 01/08/24 10/13/24  Lavona Agent, MD  tolnaftate (TINACTIN) 1 % external solution Apply 1 Application topically at bedtime. Absorbine Jr    [provider]  trolamine salicylate (ASPERCREME) 10 % cream Apply 1 application  topically as needed for muscle pain.    [provider]    Allergies: Aspirin     Review of Systems  Constitutional:  Negative for fever.  Respiratory:  Positive for shortness of breath. Negative for cough.   Cardiovascular:  Positive for chest pain.  Musculoskeletal:  Positive for myalgias.  All other systems reviewed and are negative.   Updated Vital Signs BP (!) 143/71  Pulse 80   Temp 98.4 F (36.9 C)   Resp 16   Ht 5' 5 (1.651 m)   Wt 75 kg   SpO2 95%   BMI 27.51 kg/m   Physical Exam Vitals and nursing note reviewed.  Constitutional:      General: She is not in acute distress.    Appearance: Normal appearance. She is not ill-appearing.  HENT:     Head: Normocephalic and atraumatic.     Nose: Nose normal.  Eyes:     Conjunctiva/sclera: Conjunctivae normal.  Cardiovascular:     Rate and Rhythm: Normal rate and regular rhythm.  Pulmonary:     Effort: Pulmonary effort is normal. No respiratory distress.     Breath sounds: Normal breath sounds. No wheezing.   Musculoskeletal:        General: No deformity. Normal range of motion.     Cervical back: Normal range of motion.     Right lower leg: No edema.     Left lower leg: No edema.  Skin:    Findings: No rash.  Neurological:     Mental Status: She is alert.     (all labs ordered are listed, but only abnormal results are displayed) Labs Reviewed  BASIC METABOLIC PANEL WITH GFR - Abnormal; Notable for the following components:      Result Value   Glucose, Bld 125 (*)    Creatinine, Ser 1.05 (*)    GFR, Estimated 52 (*)    All other components within normal limits  CBC - Abnormal; Notable for the following components:   RBC 5.25 (*)    All other components within normal limits  TROPONIN T, HIGH SENSITIVITY - Abnormal; Notable for the following components:   Troponin T High Sensitivity 28 (*)    All other components within normal limits  PROTIME-INR  HEPARIN  LEVEL (UNFRACTIONATED)  TROPONIN T, HIGH SENSITIVITY    EKG: EKG Interpretation Date/Time:  Wednesday January 18 2025 06:24:40 EST Ventricular Rate:  68 PR Interval:  172 QRS Duration:  96 QT Interval:  370 QTC Calculation: 393 R Axis:   69  Text Interpretation: Normal sinus rhythm with sinus arrhythmia T wave abnormality, consider lateral ischemia Abnormal ECG When compared with ECG of 08-Jan-2024 09:57, When compared with ECG of 01/08/2024, No significant change was found Confirmed by Raford Lenis ([id number]) on 01/18/2025 6:52:29 AM  Radiology: [facility] Chest 2 View Result Date: 01/18/2025 CLINICAL DATA:  Chest pain and shortness of breath. EXAM: CHEST - 2 VIEW COMPARISON:  12/12/2023 FINDINGS: The cardiopericardial silhouette is within normal limits for size. The lungs are clear without focal pneumonia, edema, pneumothorax or pleural effusion. No acute bony abnormality. IMPRESSION: No active cardiopulmonary disease. Electronically Signed   By: Camellia Candle M.D.   On: 01/18/2025 06:54     .Critical Care  Performed by: Hildegard Loge, PA-C Authorized by: Hildegard Loge, PA-C   Critical care provider statement:    Critical care time (minutes):  30   Critical care was necessary to treat or prevent imminent or life-threatening deterioration of the following conditions: ACS, heparin  gtt.   Critical care was time spent personally by me on the following activities:  Development of treatment plan with patient or surrogate, discussions with consultants, evaluation of patient's response to treatment, examination of patient, ordering and review of laboratory studies, ordering and review of radiographic studies, ordering and performing treatments and interventions, pulse oximetry, re-evaluation of patient's condition and review of old charts   Care discussed  with: admitting provider      Medications Ordered in the ED  aspirin  chewable tablet 324 mg (has no administration in time range)  nitroGLYCERIN  (NITROSTAT ) SL tablet 0.4 mg (has no administration in time range)  heparin  ADULT infusion 100 units/mL (25000 units/250mL) (has no administration in time range)  heparin  bolus via infusion 4,000 Units (has no administration in time range)    Clinical Course as of 01/18/25 0800  Wed Jan 18, 2025  0745 Troponin elevated at 28.  She does have typical story for ACS.  Has prior history of CAD.  Will proceed with aspirin , heparin  and will give dose of nitroglycerin .  Will consult cardiology. [AA]  0755 Spoke with Dr. Thukkani. They will consult. Will discuss with family practice service. [AA]    Clinical Course User Index [AA] Hildegard Loge, PA-C                                 Medical Decision Making Amount and/or Complexity of Data Reviewed Labs: ordered. Radiology: ordered.  Risk OTC drugs. Prescription drug management.   Medical Decision Making / ED Course   This patient presents to the ED for concern of chest pain, this involves an extensive number of treatment options, and is a complaint that carries with it a high risk  of complications and morbidity.  The differential diagnosis includes ACS, PE, pneumonia, MSK pain, dissection, URI  MDM: 84 year old female with past medical history for CAD presents today for concern of chest pressure that radiates into her neck and left shoulder since last night.  Compliant with her home medicines. Did not take aspirin  this morning.  CBC BMP without acute concern but initial troponin elevated at 28. Will start patient on heparin  drip.  Dose of nitroglycerin  and aspirin  ordered as she has not had any aspirin  as of yet.  Advised cardiology that patient will be admitted for NSTEMI.  Discussed with family practice service.  They will evaluate patient for admission.   Additional history obtained: -Additional history obtained from son at bedside -External records from outside source obtained and reviewed including: Chart review including previous notes, labs, imaging, consultation notes   Lab Tests: -I ordered, reviewed, and interpreted labs.   The pertinent results include:   Labs Reviewed  BASIC METABOLIC PANEL WITH GFR - Abnormal; Notable for the following components:      Result Value   Glucose, Bld 125 (*)    Creatinine, Ser 1.05 (*)    GFR, Estimated 52 (*)    All other components within normal limits  CBC - Abnormal; Notable for the following components:   RBC 5.25 (*)    All other components within normal limits  TROPONIN T, HIGH SENSITIVITY - Abnormal; Notable for the following components:   Troponin T High Sensitivity 28 (*)    All other components within normal limits  PROTIME-INR  HEPARIN  LEVEL (UNFRACTIONATED)  TROPONIN T, HIGH SENSITIVITY      EKG  EKG Interpretation Date/Time:  Wednesday January 18 2025 06:24:40 EST Ventricular Rate:  68 PR Interval:  172 QRS Duration:  96 QT Interval:  370 QTC Calculation: 393 R Axis:   69  Text Interpretation: Normal sinus rhythm with sinus arrhythmia T wave abnormality, consider lateral ischemia Abnormal  ECG When compared with ECG of 08-Jan-2024 09:57, When compared with ECG of 01/08/2024, No significant change was found Confirmed by Raford Lenis ([id number]) on 01/18/2025 6:52:29 AM  Imaging Studies ordered: I ordered imaging studies including chest x-ray I independently visualized and interpreted imaging. I agree with the radiologist interpretation   Medicines ordered and prescription drug management: Meds ordered this encounter  Medications   aspirin  chewable tablet 324 mg   nitroGLYCERIN  (NITROSTAT ) SL tablet 0.4 mg   heparin  ADULT infusion 100 units/mL (25000 units/250mL)   heparin  bolus via infusion 4,000 Units    -I have reviewed the patients home medicines and have made adjustments as needed  Critical interventions Heparin  drip, ACS  Consultations Obtained: I requested consultation with the cardiology,  and discussed lab and imaging findings as well as pertinent plan - they recommend: As above   Cardiac Monitoring: The patient was maintained on a cardiac monitor.  I personally viewed and interpreted the cardiac monitored which showed an underlying rhythm of: Normal sinus rhythm  Social Determinants of Health:  Factors impacting patients care include: Good family support   Reevaluation: After the interventions noted above, I reevaluated the patient and found that they have :stayed the same  Co morbidities that complicate the patient evaluation  Past Medical History:  Diagnosis Date   Allergy    Anemia    Anxiety    Arthritis    right hip (09/21/2014)   CAD (coronary artery disease)    PCI 90% diagonal stenosis witha DES July 2017.  Nonobstructive disease elsewhere.    Cataracts, both eyes    Colon polyps    Hyperplastic 2003   External hemorrhoid    GERD (gastroesophageal reflux disease)    Glaucoma    Hyperlipidemia    Hypertension    Internal hemorrhoid    Internal hemorrhoids without mention of complication 03/03/2012   Knee effusion, right  03/05/2020   Myocardial infarction [facility])    Palpitations 09/03/2016   Holter Aug 2017- PACs, no arrhythmia.    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  They will evaluate patient for admission   Final diagnoses:  NSTEMI (non-ST elevated myocardial infarction) [facility])    ED Discharge Orders     None          Hildegard Loge, PA-C 01/18/25 0822    Emil Share, DO 01/18/25 9171  "

## 2025-01-18 NOTE — Plan of Care (Signed)
 FMTS Interim Progress Note  S: Ms. Concepcion is a 83 YO female with PMH of CAD s/p PCI w/ DES, HTN, HLD, DMII who has been admitted for NSTEMI work-up likely Chronic atypical chest pain.  O: BP 115/62 (BP Location: Right Arm)   Pulse (!) 59   Temp 98.4 F (36.9 C) (Oral)   Resp 18   Ht 5' 4 (1.626 m)   Wt 72 kg   SpO2 99%   BMI 27.25 kg/m   Physical Exam Constitutional:      Appearance: She is well-developed.  Cardiovascular:     Heart sounds: Heart sounds are distant. No murmur heard. Pulmonary:     Effort: Pulmonary effort is normal.     Breath sounds: Normal breath sounds.  Abdominal:     Palpations: Abdomen is soft.  Neurological:     Mental Status: She is alert.      A/P: Pt rest comfortable in bed. States has been OOB to bathroom w/ assistant w/ some dizziness when got up. Still have some chest discomfort but improving. Pending Echo result  Suzen Houston NOVAK, DO 01/18/2025, 9:05 PM PGY-1, [facility] Family Medicine Service pager [phone]

## 2025-01-18 NOTE — Plan of Care (Signed)

## 2025-01-18 NOTE — ED Notes (Signed)
 NT assisted pt to restroom with wheelchair

## 2025-01-18 NOTE — Progress Notes (Signed)
 2D Echocardiogram performed.

## 2025-01-18 NOTE — Progress Notes (Signed)
 PHARMACY - ANTICOAGULATION CONSULT NOTE  Pharmacy Consult for IV heparin  Indication: chest pain/ACS  Allergies[1]  Patient Measurements: Height: 5' 4 (162.6 cm) Weight: 72 kg (158 lb 11.7 oz) IBW/kg (Calculated) : 54.7 HEPARIN  DW (KG): 69.5  Vital Signs: Temp: 99.1 F (37.3 C) (01/21 1702) Temp Source: Oral (01/21 1702) BP: 131/67 (01/21 1702) Pulse Rate: 61 (01/21 1702)  Labs: Recent Labs    01/18/25 0631 01/18/25 1809  HGB 14.2  --   HCT 43.7  --   PLT 277  --   LABPROT 12.6  --   INR 0.9  --   HEPARINUNFRC  --  0.33  CREATININE 1.05*  --     Estimated Creatinine Clearance: 39.5 mL/min (A) (by C-G formula based on SCr of 1.05 mg/dL (H)).   Medical History: Past Medical History:  Diagnosis Date   Allergy    Anemia    Anxiety    Arthritis    right hip (09/21/2014)   CAD (coronary artery disease)    PCI 90% diagonal stenosis witha DES July 2017.  Nonobstructive disease elsewhere.    Cataracts, both eyes    Colon polyps    Hyperplastic 2003   External hemorrhoid    GERD (gastroesophageal reflux disease)    Glaucoma    Hyperlipidemia    Hypertension    Internal hemorrhoid    Internal hemorrhoids without mention of complication 03/03/2012   Knee effusion, right 03/05/2020   Myocardial infarction [facility])    Palpitations 09/03/2016   Holter Aug 2017- PACs, no arrhythmia.    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 year old female admitted on 01/18/2025 with CP and elevated troponin concern for ACS. No anticoagulation prior to admission. Pharmacy consulted to dose heparin .  Initial heparin  level 0.33 therapeutic after  initial bolus and on rate 850 units/hr.  No issues noted.  Hx Diag stent 06/2016, LVEF preserved.  Goal of Therapy:  Heparin  level 0.3-0.7 units/ml Monitor platelets by anticoagulation protocol: Yes   Plan:  Continue heparin  850 units/hr F/u confirmatory HL with AM labs Daily heparin  level, CBC, and monitoring for bleeding F/u plans per cards recs / cath plans   Thank you for allowing pharmacy to participate in this patient's care.  Maurilio Fila, PharmD Clinical Pharmacist 01/18/2025  7:15 PM    [1]  Allergies Allergen Reactions   Aspirin  Other (See Comments)    Makes stomach burn

## 2025-01-18 NOTE — ED Triage Notes (Addendum)
 Patient reports central chest pain with SOB radiating to left neck and left shoulder with dizziness onset yesterday . History of CAD/Stent .

## 2025-01-18 NOTE — Consult Note (Addendum)
 "  Cardiology Consultation   Wolf ID: Heidi Wolf MRN: [id number]; DOB: 08-Mar-1941  Admit date: 01/18/2025 Date of Consult: 01/18/2025  PCP:  Lennie Raguel MATSU, DO   [facility] Providers Cardiologist:  Lynwood Schilling, MD        Wolf Profile: Heidi Wolf is a 84 y.o. female with a hx of CAD s/p PCI with DES to diagonal in July 2017, palpitations/PACs, HTN, HLD, type 2 diabetes mellitus, lumbar spinal stenosis who is being seen 01/18/2025 for Heidi evaluation of chest pain at Heidi request of Dr. Rollene Keeling.  History of Present Illness: Heidi Wolf has medical history as stated above. Underwent nuclear stress test in 2015, which showed inferior wall scarring in Heidi LV with an akinetic cardiac apex and apical lateral segment, no inducble ischemia. S/p cardiac catheterization with PCI/DES to diagonal branch on 07/03/16. At that time echo showed preserved LVEF and no RWMA. Cardiac monitor from 2017 showed PACs and BB therapy was increased.   Admitted in 2021 with complaints of 2 weeks of chest pain, observed overnight, cardiac enzymes remained negative and EKG without acute ischemic changes. Echo with normal LVEF and no RWMA. Chest pain felt to be noncardiac, possibly viral gastroentiritis. Discharged Heidi next day without ischemic evaluation.  Seen in Heidi ED on 11/23/23 for dizziness and headache with complaint of chest pain unrelieved by NTG. Negative troponins x2 with unchanged EKG. Felt to be atypical chest pain and chronic. Metoprolol  was discontinued due to bradycardia with dizziness. Discharged with outpatient PET stress and outpatient echo to follow up in cardiology clinic. Echo 11/2023 showed LVEF 70-75% and no RWMA. PET stress 01/2024 showed small defect in anterior apical wall, felt to be low risk and therefore managed medically.  Follows outpatient with Dr. Yevonne, last seen on 10/13/24 at which time she was overall stable without any new cardiovascular symptoms.    Presented to Heidi ED this morning with complaints of 9/10 severity substernal chest pressure with radiation to Heidi left side of her neck and left shoulder. States that she woke up from her sleep last night around 1am to use Heidi restroom and felt dizzy upon standing, felt like Heidi room was spinning and had to sit down to feel steady. Her chest pain came on in Heidi midst of Heidi dizziness along with some dyspnea, nausea, and numbness in her hands. Denies any diaphoresis or syncope. Heidi pain subsided but came back this morning, which prompted her to call her son and come into Heidi ED out of concern. Has history of PCI in 2017, she states that this chest pressure feels different than Heidi chest pain she used to have back then. Has had recurrent chest pain (last evaluated in Heidi ED in 10/2023, see above) but seems as though her dizziness is Heidi main driver in symptoms. BP stable at home per Wolf and has been stable currently. Compliant with all home medications. States that her she feels significantly better at time of interview, after resting and one NTG dose. Tenderness is reproducible to palpation and with bodily movement/rotation.  Relevant ED workup: EKG showed sinus rhythm, no acute ischemic changes, unchanged from prior EKG. Hs-troponin 28 >> 31. No acute abnormalities on CXR. Echo pending. Creatinine slightly elevated around baseline 1.0-1.1, otherwise unremarkable. CBC unremarkable.   Past Medical History:  Diagnosis Date   Allergy    Anemia    Anxiety    Arthritis    right hip (09/21/2014)   CAD (coronary artery disease)  PCI 90% diagonal stenosis witha DES July 2017.  Nonobstructive disease elsewhere.    Cataracts, both eyes    Colon polyps    Hyperplastic 2003   External hemorrhoid    GERD (gastroesophageal reflux disease)    Glaucoma    Hyperlipidemia    Hypertension    Internal hemorrhoid    Internal hemorrhoids without mention of complication 03/03/2012   Knee effusion, right  03/05/2020   Myocardial infarction [facility])    Palpitations 09/03/2016   Holter Aug 2017- PACs, no arrhythmia.    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   Severe Lumbar spinal stenosis 09/19/2022   Stenosing tenosynovitis of finger of left hand 05/06/2021   Trigger finger, acquired 05/06/2021   Trigger finger, left 03/08/2021   Type II diabetes mellitus (HCC)    type 2   Whiplash injury, acute, sequela 03/05/2020    Past Surgical History:  Procedure Laterality Date   ABDOMINAL HYSTERECTOMY     APPENDECTOMY     w/gallbladder   CARDIAC CATHETERIZATION  X 2   CARDIAC CATHETERIZATION N/A 07/03/2016   Procedure: Left Heart Cath and Coronary Angiography;  Surgeon: Salena Negri, MD;  Location: [facility] INVASIVE CV LAB;  Service: Cardiovascular;  Laterality: N/A;   CARDIAC CATHETERIZATION N/A 07/03/2016   Procedure: Coronary Stent Intervention;  Surgeon: Candyce GORMAN Reek, MD;  Location: [facility] INVASIVE CV LAB;  Service: Cardiovascular;  Laterality: N/A;   CARDIAC CATHETERIZATION N/A 07/15/2016   Procedure: Left Heart Cath and Coronary Angiography;  Surgeon: Salena Negri, MD;  Location: [facility] INVASIVE CV LAB;  Service: Cardiovascular;  Laterality: N/A;   CHOLECYSTECTOMY     COLONOSCOPY     CORONARY ANGIOPLASTY WITH STENT PLACEMENT  06/2016   TUBAL LIGATION  1971       Scheduled Meds:  amLODipine   10 mg Oral q AM   [START ON 01/19/2025] aspirin  EC  81 mg Oral Daily   aspirin   325 mg Oral Daily   famotidine   10 mg Oral BID   irbesartan   150 mg Oral Daily   isosorbide  mononitrate  60 mg Oral Daily   latanoprost   1 drop Both Eyes QHS   loratadine   10 mg Oral Daily   rosuvastatin   40 mg Oral QPM   sodium chloride  flush  3 mL Intravenous Q12H   Continuous Infusions:  heparin  850 Units/hr (01/18/25 0855)   PRN Meds: acetaminophen  **OR** acetaminophen , ondansetron  **OR** ondansetron  (ZOFRAN ) IV, perflutren  lipid microspheres (DEFINITY ) IV suspension  Allergies:    Allergies[1]  Social History:   Social History   Socioeconomic History   Marital status: Legally Separated    Spouse name: Not on file   Number of children: 3   Years of education: Not on file   Highest education level: 12th grade  Occupational History   Occupation: retired  Tobacco Use   Smoking status: Never    Passive exposure: Never   Smokeless tobacco: Never  Vaping Use   Vaping status: Never Used  Substance and Sexual Activity   Alcohol use: No   Drug use: No   Sexual activity: Never  Other Topics Concern   Not on file  Social History Narrative   LIves with Tammy   Social Drivers of Health   Tobacco Use: Low Risk (11/10/2024)   Wolf History    Smoking Tobacco Use: Never    Smokeless Tobacco Use: Never    Passive Exposure: Never  Financial Resource Strain: Low Risk (08/22/2024)   Overall  Financial Resource Strain (CARDIA)    Difficulty of Paying Living Expenses: Not very hard  Food Insecurity: No Food Insecurity (08/22/2024)   Epic    Worried About Programme Researcher, Broadcasting/film/video in Heidi Last Year: Never true    Ran Out of Food in Heidi Last Year: Never true  Transportation Needs: No Transportation Needs (08/22/2024)   Epic    Lack of Transportation (Medical): No    Lack of Transportation (Non-Medical): No  Physical Activity: Wolf Declined (08/22/2024)   Exercise Vital Sign    Days of Exercise per Week: Wolf declined    Minutes of Exercise per Session: Wolf declined  Stress: No Stress Concern Present (08/22/2024)   [employer] of Occupational Health - Occupational Stress Questionnaire    Feeling of Stress: Not at all  Social Connections: Unknown (08/22/2024)   Social Connection and Isolation Panel    Frequency of Communication with Friends and Family: More than three times a week    Frequency of Social Gatherings with Friends and Family: Wolf declined    Attends Religious Services: Wolf declined    Active Member of Clubs or Organizations: No     Attends Banker Meetings: Wolf declined    Marital Status: Wolf declined  Intimate Partner Violence: Not At Risk (08/22/2024)   Epic    Fear of Current or Ex-Partner: No    Emotionally Abused: No    Physically Abused: No    Sexually Abused: No  Depression (PHQ2-9): Low Risk (11/10/2024)   Depression (PHQ2-9)    PHQ-2 Score: 0  Alcohol Screen: Low Risk (08/22/2024)   Alcohol Screen    Last Alcohol Screening Score (AUDIT): 0  Housing: Low Risk (08/22/2024)   Epic    Unable to Pay for Housing in Heidi Last Year: No    Number of Times Moved in Heidi Last Year: 0    Homeless in Heidi Last Year: No  Utilities: Not At Risk (08/22/2024)   Epic    Threatened with loss of utilities: No  Health Literacy: Adequate Health Literacy (08/22/2024)   B1300 Health Literacy    Frequency of need for help with medical instructions: Never    Family History:    Family History  Problem Relation Age of Onset   Diabetes Mother    Arthritis Mother    CAD Mother 87       CABG   CAD Brother    CAD Sister 78       CABG   Stroke Brother    Stroke Daughter    Diabetes Maternal Grandmother    Stroke Maternal Grandmother    Stroke Father    Heart failure Sister    Kidney failure Sister    Breast cancer Other    Colon cancer Neg Hx    Stomach cancer Neg Hx    Esophageal cancer Neg Hx    Pancreatic cancer Neg Hx    Rectal cancer Neg Hx      ROS:  Please see Heidi history of present illness.   All other ROS reviewed and negative.     Physical Exam/Data: Vitals:   01/18/25 1045 01/18/25 1155 01/18/25 1240 01/18/25 1300  BP: (!) 115/47  131/71 124/88  Pulse: (!) 57  (!) 58 (!) 57  Resp: 14  16 16   Temp:  98.4 F (36.9 C)    TempSrc:  Oral    SpO2: 100%  100% 100%  Weight:      Height:  No intake or output data in Heidi 24 hours ending 01/18/25 1329    01/18/2025    7:00 AM 11/10/2024    9:35 AM 10/13/2024   10:16 AM  Last 3 Weights  Weight (lbs) 165 lb 5.5 oz 165 lb 8 oz  165 lb  Weight (kg) 75 kg 75.07 kg 74.844 kg     Body mass index is 27.51 kg/m.  General: Well nourished, well developed, in no acute distress Neck: No JVD Vascular: Distal pulses 2+ bilaterally Cardiac: Distant heart sounds; normal S1, S2; RRR; no murmur Lungs: Clear to auscultation bilaterally, no wheezing, rhonchi or rales  Abd: Soft, nontender  Ext: No peripheral edema Musculoskeletal: No deformities Skin: Warm and dry  Neuro: No focal abnormalities noted Psych: Normal affect   EKG: Heidi ED EKG was personally reviewed and demonstrates: Sinus rhythm, no acute ischemic changes, unchanged compared to prior EKG Telemetry: Telemetry was personally reviewed and demonstrates: Sinus rhythm, rates in Heidi 60s  Relevant CV Studies:  Cardiac monitor [03/03/24]: Sinus rhythm HR 43 - 135, average 62 bpm. 9 brief episodes of nonsustained SVT (longest 6.6 seconds, 11 beats) No atrial fibrillation detected. Rare supraventricular ectopy. Rare ventricular ectopy. No sustained arrhythmias. Symptom trigger episodes correspond to sinus rhythm, occasionally with ectopy  Echo [12/21/23]: 1. Apical hypertrophy. Left ventricular ejection fraction, by estimation, is 70 to 75% . Left ventricular ejection fraction by 3D volume is 72 % . Heidi left ventricle has hyperdynamic function. Heidi left ventricle has no regional wall motion abnormalities. Left ventricular diastolic parameters are consistent with Grade I diastolic dysfunction ( impaired relaxation) . 2. Right ventricular systolic function is normal. Heidi right ventricular size is normal. 3. Heidi mitral valve is normal in structure. No evidence of mitral valve regurgitation. No evidence of mitral stenosis. 4. Heidi aortic valve is normal in structure. Aortic valve regurgitation is not visualized. No aortic stenosis is present. 5. Heidi inferior vena cava is normal in size with greater than 50% respiratory variability, suggesting right atrial pressure of 3  mmHg.  PET stress [02/03/24]:   LV perfusion is abnormal. Defect 1: There is a small defect with mild reduction in uptake present in Heidi apical anterior location(s) that is reversible. There is abnormal wall motion in Heidi defect area. Consistent with ischemia. Heidi defect is consistent with abnormal perfusion in Heidi LAD territory.   Rest left ventricular function is abnormal. Rest global function is mildly reduced. There was a single regional abnormality. Rest EF: 49%. Stress EF: 58%. End diastolic cavity size is normal. End systolic cavity size is normal.   Myocardial blood flow was computed to be 1.57ml/g/min at rest and 2,011.53ml/g/min at stress. Global myocardial blood flow reserve was 1,764.04 and was mildly abnormal.   Coronary calcium  was present on Heidi attenuation correction CT images. Moderate coronary calcifications were present. Coronary calcifications were present in Heidi left anterior descending artery distribution(s).   Findings are consistent with ischemia. Heidi study is low risk.   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 Heidi lesion was not previously treated. Ost Cx to Prox Cx lesion, 20% stenosed. Heidi lesion was not previously treated. Ost 2nd Diag to 2nd Diag lesion, 80% stenosed. Heidi lesion was not previously treated. 2nd Diag lesion, 90% stenosed. Post intervention with a 2.25 x 12 Synergy drug eluting stent postdilated to > 2.5  mm, there is a 0% residual stenosis. Continue dual antiplatelet therapy with aspirin  and Plavix  for at least 6-12 months ideally.  If she has sensitivity to aspirin , can continue Plavix  alone long term.  Continue aggressive secondary prevention.   Laboratory Data: High Sensitivity Troponin:  No results for input(s): TROPONINIHS in Heidi last 720 hours.  Recent Labs  Lab 01/18/25 0631  01/18/25 0935  TRNPT 28* 31*      Chemistry Recent Labs  Lab 01/18/25 0631  NA 136  K 4.0  CL 101  CO2 23  GLUCOSE 125*  BUN 19  CREATININE 1.05*  CALCIUM  9.6  GFRNONAA 52*  ANIONGAP 12    No results for input(s): PROT, ALBUMIN, AST, ALT, ALKPHOS, BILITOT in Heidi last 168 hours. Lipids No results for input(s): CHOL, TRIG, HDL, LABVLDL, LDLCALC, CHOLHDL in Heidi last 168 hours.  Hematology Recent Labs  Lab 01/18/25 0631  WBC 8.5  RBC 5.25*  HGB 14.2  HCT 43.7  MCV 83.2  MCH 27.0  MCHC 32.5  RDW 14.6  PLT 277   Thyroid  No results for input(s): TSH, FREET4 in Heidi last 168 hours.  BNPNo results for input(s): BNP, PROBNP in Heidi last 168 hours.  DDimer No results for input(s): DDIMER in Heidi last 168 hours.  Radiology/Studies:  DG Chest 2 View Result Date: 01/18/2025 CLINICAL DATA:  Chest pain and shortness of breath. EXAM: CHEST - 2 VIEW COMPARISON:  12/12/2023 FINDINGS: Heidi cardiopericardial silhouette is within normal limits for size. Heidi lungs are clear without focal pneumonia, edema, pneumothorax or pleural effusion. No acute bony abnormality. IMPRESSION: No active cardiopulmonary disease. Electronically Signed   By: Camellia Candle M.D.   On: 01/18/2025 06:54     Assessment and Plan: Atypical chest pain CAD s/p PCI with DES to diagonal in 2017 Presented with 9/10 severity substernal chest pressure with radiation to neck and left shoulder +dyspnea, nausea. Some improvement with rest and NTG, reproducible with palpation and bodily movement/rotation. Atypical for ischemic cause; presented in Heidi setting of preceding dizziness/vertigo upon standing, seems to be a chronic issue EKG showed sinus rhythm without acute ischemic changes, unchanged from prior EKG Hs-troponin 28 >> 31 CXR showed no acute findings S/p PCI with DES to Dx in 2017. Most recent ischemic eval includes PET stress 01/2024 which was found to be low risk Given ASA 324 mg and  SL NTG x 1 in Heidi ED. On heparin  Continue home rosuvastatin  40 mg daily, Imdur  60 mg daily, ASA Further recs pending echo, however appears to be chronic atypical chest pain so do not anticipate ischemic eval at this time  Hypertension BP stable Continue home antihypertensives  Hyperlipidemia Most recent lipid panel with LDL 40 Continue home rosuvastatin  40 mg daily  History of palpitations/PACs Cardiac monitor 02/2024 showed sinus rhythm with 9 brief episodes of non-sustained SVT (longest episode 6.6 seconds, 11 beats). Previously on metoprolol , discontinued due to bradycardia in 40s and dizziness Continue telemetry  Per primary Type 2 diabetes mellitus Lumbar spinal stenosis   Risk Assessment/Risk Scores:       For questions or updates, please contact [facility] Please consult [email or web address] for contact info under      Signed, Owen MARLA Daniels, PA-C  01/18/2025 1:29 PM  ATTENDING ATTESTATION:  After conducting a review of all available clinical information with Heidi care team, interviewing Heidi Wolf, and performing a physical exam, I agree with Heidi findings and plan described in this note with adjustments as indicated below which  were discussed and enacted by staff above.   GEN: No acute distress, AO x 3 HEENT:  MMM, no JVD, no scleral icterus Cardiac: RRR, no murmurs, rubs, or gallops.  Tender to palpation left chest Respiratory: Clear to auscultation bilaterally. GI: Soft, nontender, non-distended  MS: No edema; No deformity. Neuro:  Nonfocal  Vasc:  +2 radial pulses  Heidi Wolf is an 84 year old female with a history of coronary artery disease status post PCI of diagonal in 2017, hypertension, hyperlipidemia, type 2 diabetes, lumbar stenosis, long history of atypical chest pain who presents with chest pain.  Heidi Wolf was in her normal state of health up until last few days.  Yesterday she developed chest discomfort which started in her central chest and  radiated to her left shoulder and neck.  Her troponins were very mildly elevated.  Heidi Wolf did receive nitroglycerin  which did help.  She tells me this is Heidi same type of pain that she has experienced in Heidi past which are thought to be atypical in nature.  She tells me that she has point tenderness of her left chest.  When palpated this exacerbates her pain.  In Heidi emergency department her EKG demonstrates sinus rhythm with sinus arrhythmia with unchanged T wave inversions in V4 through V6.  First troponin is 28 Heidi second is 31.  Her other laboratories are unremarkable with a hemoglobin of 14.2 and a creatinine of 1.05.  Given that Heidi Wolf's chest pain syndrome is exacerbated by palpation will defer ischemic evaluation at this time.  Continue medical therapy.  Follow up TTE.  Bertis Hustead, MD Pager [phone]      [1]  Allergies Allergen Reactions   Aspirin  Other (See Comments)    Makes stomach burn   "

## 2025-01-18 NOTE — Progress Notes (Signed)
 PHARMACY - ANTICOAGULATION CONSULT NOTE  Pharmacy Consult for IV heparin  Indication: chest pain/ACS  Allergies[1]  Patient Measurements: Height: 5' 5 (165.1 cm) Weight: 75 kg (165 lb 5.5 oz) IBW/kg (Calculated) : 57 HEPARIN  DW (KG): 72.4  Vital Signs: Temp: 98.4 F (36.9 C) (01/21 0612) BP: 143/71 (01/21 0612) Pulse Rate: 80 (01/21 0612)  Labs: Recent Labs    01/18/25 0631  HGB 14.2  HCT 43.7  PLT 277  LABPROT 12.6  INR 0.9  CREATININE 1.05*    Estimated Creatinine Clearance: 41.1 mL/min (A) (by C-G formula based on SCr of 1.05 mg/dL (H)).   Medical History: Past Medical History:  Diagnosis Date   Allergy    Anemia    Anxiety    Arthritis    right hip (09/21/2014)   CAD (coronary artery disease)    PCI 90% diagonal stenosis witha DES July 2017.  Nonobstructive disease elsewhere.    Cataracts, both eyes    Colon polyps    Hyperplastic 2003   External hemorrhoid    GERD (gastroesophageal reflux disease)    Glaucoma    Hyperlipidemia    Hypertension    Internal hemorrhoid    Internal hemorrhoids without mention of complication 03/03/2012   Knee effusion, right 03/05/2020   Myocardial infarction [facility])    Palpitations 09/03/2016   Holter Aug 2017- PACs, no arrhythmia.    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 year old female admitted on 01/18/2025 with CP and elevated troponin concern for ACS. No anticoagulation prior to admission. Pharmacy consulted to dose heparin .  Goal of Therapy:  Heparin  level 0.3-0.7 units/ml Monitor platelets by anticoagulation protocol: Yes   Plan:  Heparin  4000 units x 1 as bolus  followed by heparin  infusion at 850 units/hr 8h heparin  level  Daily heparin  level, CBC, and monitoring for bleeding F/u plans per cards recs  Thank you for allowing pharmacy to participate in this patient's care.  Leonor GORMAN Bash, PharmD Emergency Medicine Clinical Pharmacist 01/18/2025,7:48 AM       [1]  Allergies Allergen Reactions   Aspirin  Other (See Comments)    Makes stomach burn

## 2025-01-19 DIAGNOSIS — I214 Non-ST elevation (NSTEMI) myocardial infarction: Secondary | ICD-10-CM | POA: Diagnosis not present

## 2025-01-19 DIAGNOSIS — R079 Chest pain, unspecified: Secondary | ICD-10-CM | POA: Diagnosis not present

## 2025-01-19 LAB — BASIC METABOLIC PANEL WITH GFR
Anion gap: 12 (ref 5–15)
BUN: 20 mg/dL (ref 8–23)
CO2: 23 mmol/L (ref 22–32)
Calcium: 9 mg/dL (ref 8.9–10.3)
Chloride: 103 mmol/L (ref 98–111)
Creatinine, Ser: 1.22 mg/dL — ABNORMAL HIGH (ref 0.44–1.00)
GFR, Estimated: 44 mL/min — ABNORMAL LOW
Glucose, Bld: 105 mg/dL — ABNORMAL HIGH (ref 70–99)
Potassium: 3.9 mmol/L (ref 3.5–5.1)
Sodium: 138 mmol/L (ref 135–145)

## 2025-01-19 LAB — CBC
HCT: 41.8 % (ref 36.0–46.0)
Hemoglobin: 13.6 g/dL (ref 12.0–15.0)
MCH: 26.9 pg (ref 26.0–34.0)
MCHC: 32.5 g/dL (ref 30.0–36.0)
MCV: 82.8 fL (ref 80.0–100.0)
Platelets: 255 K/uL (ref 150–400)
RBC: 5.05 MIL/uL (ref 3.87–5.11)
RDW: 14.4 % (ref 11.5–15.5)
WBC: 8.4 K/uL (ref 4.0–10.5)
nRBC: 0 % (ref 0.0–0.2)

## 2025-01-19 LAB — HEPARIN LEVEL (UNFRACTIONATED): Heparin Unfractionated: 0.27 [IU]/mL — ABNORMAL LOW (ref 0.30–0.70)

## 2025-01-19 MED ORDER — ORAL CARE MOUTH RINSE: 15.0000 mL | OROMUCOSAL | Status: DC | PRN

## 2025-01-19 MED ORDER — HEPARIN BOLUS VIA INFUSION
1000.0000 [IU] | Freq: Once | INTRAVENOUS | Status: AC
  Administered 2025-01-19: 1000 [IU] via INTRAVENOUS
  Filled 2025-01-19: qty 1000

## 2025-01-19 NOTE — Assessment & Plan Note (Signed)
 Hyperlipidemia: Continue Crestor  Hypertension: Continue amlodipine  Radiculopathy: Continue home gabapentin  Glaucoma: continue home eye drops GERD: home pepcid  Continued home cholecalciferol  and claritin 

## 2025-01-19 NOTE — TOC CM/SW Note (Addendum)
 Transition of Care [facility]) - Inpatient Brief Assessment   Patient Details  Name: Heidi Wolf MRN: [id number] Date of Birth: 05-30-1941  Transition of Care [facility]) CM/SW Contact:    Waddell Barnie Rama, RN Phone Number: 01/19/2025, 3:40 PM   Clinical Narrative: From home with daughter, has PCP and insurance on file, states has no HH services in place at this time, has a cane and a relatives [place] at home, but would like her own [place].  States family member (son)  will transport them home at [employer] and family is support system, states gets medications from [facility] on [address].  Pta self ambulatory with cane.  PT eval rec HHPT, HHOT, will need HHAIDE. Also.  NCM offered HH , she chose [facility], NCM sent referral to [facility], they accepted, soc will begin 24 to 48 hrs post dc.  [employer] to supply rolling Natividad and tub bench.    Transition of Care Asessment: Insurance and Status: Insurance coverage has been reviewed Patient has primary care physician: Yes Home environment has been reviewed: home with daughter Prior level of function:: ambulatory with cane Prior/Current Home Services: Current home services (cane, borrows a relatives [place]) Social Drivers of Health Review: SDOH reviewed no interventions necessary Readmission risk has been reviewed: Yes Transition of care needs: transition of care needs identified, TOC will continue to follow

## 2025-01-19 NOTE — Progress Notes (Signed)
 "    Daily Progress Note Intern Pager: [phone]  Patient name: Heidi Wolf Medical record number: [id number] Date of birth: Aug 27, 1941 Age: 84 y.o. Gender: female  Primary Care Provider: Lennie Raguel MATSU, DO Consultants: Cardiology Code Status: Full  Pt Overview and Major Events to Date:  01/21: Admitted for NSTEMI, no PCI indicated  Assessment and Plan:  Heidi Wolf is an 84yo F w/PMH of CAD s/p PCI in 2017, HLD, HTN, GERD, admitted for NSTEMI.  Assessment & Plan NSTEMI (non-ST elevated myocardial infarction) [facility]) CAD- S/P PCI July 2017 - Cardiology consulted in ED, appreciate recommendations  - with flat troponins  - d/c heparin   - will reach out for recommendations regarding inpatient vs outpatient cardiac MR and cardiac clearance  - ASA daily - Continue home Imdur  Chronic health problem Hyperlipidemia: Continue Crestor  Hypertension: Continue amlodipine  Radiculopathy: Continue home gabapentin  Glaucoma: continue home eye drops GERD: home pepcid  Continued home cholecalciferol  and claritin   FEN/GI: Heart healthy PPx: Heparin  gtt Dispo:Home pending clinical improvement . Barriers include cardiology clearance.   Subjective:  Patient was seen and examined at bedside. She states she has a constant pressure-like feeling across her chest. She had one episode of L sided jaw pain that radiated into her neck and into her L shoulder overnight. She was given Tylenol  which resolved the pain. She has not had the pain since. She denies SOB or LE edema.   Objective: Temp:  [98.4 F (36.9 C)-99.1 F (37.3 C)] 99 F (37.2 C) (01/22 0735) Pulse Rate:  [49-67] 62 (01/22 0735) Resp:  [12-21] 19 (01/22 0735) BP: (115-165)/(47-88) 165/55 (01/22 0735) SpO2:  [96 %-100 %] 96 % (01/22 0735) Weight:  [72 kg] 72 kg (01/21 1702) Physical Exam: General: pleasant, alert female sitting at the side of her hospital bed, in no acute distress Cardiovascular: RRR, no m/r/g, mild  reproducible TTP across entire chest Respiratory: CTAB, normal WOB on RA, no w/r/r, speaks in clear full sentences without difficulty  Abdomen: soft, nontender, nondistended Extremities: no peripheral edema, moves all extremities equally  Laboratory: Most recent CBC Lab Results  Component Value Date   WBC 8.4 01/19/2025   HGB 13.6 01/19/2025   HCT 41.8 01/19/2025   MCV 82.8 01/19/2025   PLT 255 01/19/2025   Most recent BMP    Latest Ref Rng & Units 01/19/2025    2:45 AM  BMP  Glucose 70 - 99 mg/dL 894   BUN 8 - 23 mg/dL 20   Creatinine 9.55 - 1.00 mg/dL 8.77   Sodium 864 - 854 mmol/L 138   Potassium 3.5 - 5.1 mmol/L 3.9   Chloride 98 - 111 mmol/L 103   CO2 22 - 32 mmol/L 23   Calcium  8.9 - 10.3 mg/dL 9.0    Imaging/Diagnostic Tests: Echocardiogram 01/18/2025 IMPRESSIONS   1. Echo-enhancing images raises concerns for apical hypertrophic  cardiomyopathy would benefit from cardiac MR. Left ventricular ejection  fraction, by estimation, is 60 to 65%. The left ventricle has normal  function. The left ventricle demonstrates  regional wall motion abnormalities (see scoring diagram/findings for  description). Left ventricular diastolic parameters were normal.   2. Right ventricular systolic function is normal. The right ventricular  size is normal. There is normal pulmonary artery systolic pressure.   3. Left atrial size was mildly dilated.   4. The mitral valve is normal in structure. Mild mitral valve  regurgitation. No evidence of mitral stenosis.   5. The aortic valve is normal in structure. Aortic  valve regurgitation is  not visualized. No aortic stenosis is present.   6. The inferior vena cava is normal in size with greater than 50%  respiratory variability, suggesting right atrial pressure of 3 mmHg.   Lupie Credit, DO 01/19/2025, 7:37 AM  PGY-1, [facility] Family Medicine FPTS Intern pager: [phone], text pages welcome Secure chat group [facility]  Teaching Service   "

## 2025-01-19 NOTE — Discharge Summary (Signed)
 "  Family Medicine Teaching [facility] Discharge Summary  Patient name: Heidi Wolf Medical record number: [id number] Date of birth: 08/22/1941 Age: 84 y.o. Gender: female Date of Admission: 01/18/2025  Date of Discharge: 01/19/25 Admitting Physician: Rollene FORBES Keeling, MD  Primary Care Provider: Lennie Raguel MATSU, DO Consultants: Cardiology  Indication for Hospitalization: Chest pain, concern for NSTEMI  Discharge Diagnoses/Problem List:  Principal Problem for Admission: Chest pain Other Problems addressed during stay:  Principal Problem:   Chest pain Active Problems:   CAD- S/P PCI July 2017   NSTEMI (non-ST elevated myocardial infarction) [facility])   Chronic health problem   Brief Hospital Course:  Heidi Wolf is a 84 y.o.female with a history of CAD s/p PCI July 2017, HLD, HTN, radiculopathy, glaucoma, GERD who was admitted to the family medicine teaching Service at [facility] for chest pressure. Her hospital course is detailed below:  Chest pressure/concern for NSTEMI History of CAD s/p PCI in July 2017 Patient presented due to chest pressure radiating to left shoulder and neck with history of diaphoresis and dizziness.  Her troponin was elevated and given her history there was concern for NSTEMI so she was given aspirin , nitroglycerin , and started on heparin  drip.  Was seen by cardiology who felt her symptoms were less likely to be acute ischemia.  Heparin  drip was discontinued on the day after admission.  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  Cardiac MRI was recommended for further evaluation, can be done outpatient.  Plan for outpatient follow-up with cardiology.  No changes were made to patient's home medication regimen.  Other chronic conditions were medically managed with home medications and formulary alternatives as necessary  PCP Follow-up Recommendations: Ensure cardiology follow up -  outpatient cardiac MR may be needed Consider recheck BMP     Pertinent imaging: DG Chest 2 view 1/21 IMPRESSION: No active cardiopulmonary disease.     Electronically Signed   By: Camellia Candle M.D.   On: 01/18/2025 06:54     Echo 1/21: IMPRESSIONS     1. Echo-enhancing images raises concerns for apical hypertrophic  cardiomyopathy would benefit from cardiac MR. Left ventricular ejection  fraction, by estimation, is 60 to 65%. The left ventricle has normal  function. The left ventricle demonstrates  regional wall motion abnormalities (see scoring diagram/findings for  description). Left ventricular diastolic parameters were normal.   2. Right ventricular systolic function is normal. The right ventricular  size is normal. There is normal pulmonary artery systolic pressure.   3. Left atrial size was mildly dilated.   4. The mitral valve is normal in structure. Mild mitral valve  regurgitation. No evidence of mitral stenosis.   5. The aortic valve is normal in structure. Aortic valve regurgitation is  not visualized. No aortic stenosis is present.   6. The inferior vena cava is normal in size with greater than 50%  respiratory variability, suggesting right atrial pressure of 3 mmHg.      Results/Tests Pending at Time of Discharge:  Unresulted Labs (From admission, onward)    None        Disposition: Home, HHPT  Discharge Condition: stable  Discharge Exam:  Vitals:   01/19/25 0754 01/19/25 1215  BP: (!) 142/73 124/64  Pulse: (!) 58 64  Resp: 19 19  Temp: 98.4 F (36.9 C) 99 F (37.2 C)  SpO2: 100% 99%   Per Dr. Lupie on the day of discharge: General: pleasant, alert female  sitting at the side of her hospital bed, in no acute distress Cardiovascular: RRR, no m/r/g, mild reproducible TTP across entire chest Respiratory: CTAB, normal WOB on RA, no w/r/r, speaks in clear full sentences without difficulty  Abdomen: soft, nontender,  nondistended Extremities: no peripheral edema, moves all extremities equally  Significant Procedures: n/a  Significant Labs and Imaging:  Recent Labs  Lab 01/18/25 0631 01/19/25 0245  WBC 8.5 8.4  HGB 14.2 13.6  HCT 43.7 41.8  PLT 277 255   Recent Labs  Lab 01/18/25 0631 01/19/25 0245  NA 136 138  K 4.0 3.9  CL 101 103  CO2 23 23  GLUCOSE 125* 105*  BUN 19 20  CREATININE 1.05* 1.22*  CALCIUM  9.6 9.0      Discharge Medications:  Allergies as of 01/19/2025       Reactions   Aspirin  Other (See Comments)   Makes stomach burn        Medication List     TAKE these medications    amLODipine  10 MG tablet Commonly known as: NORVASC  Take 1 tablet (10 mg total) by mouth in the morning.   aspirin  EC 81 MG tablet Take 1 tablet (81 mg total) by mouth daily.   CHOLECALCIFEROL  PO Take 1,000 Units by mouth in the morning.   famotidine  10 MG tablet Commonly known as: PEPCID  Take 1 tablet (10 mg total) by mouth 2 (two) times daily. What changed:  when to take this reasons to take this   gabapentin  100 MG capsule Commonly known as: NEURONTIN  Take 1 capsule (100 mg total) by mouth 3 (three) times daily. What changed:  when to take this reasons to take this   isosorbide  mononitrate 60 MG 24 hr tablet Commonly known as: IMDUR  Take 1 tablet (60 mg total) by mouth daily.   loratadine  10 MG tablet Commonly known as: CLARITIN  Take 1 tablet (10 mg total) by mouth daily. What changed:  when to take this reasons to take this   Lumigan  0.01 % Soln Generic drug: bimatoprost  Place 1 drop into both eyes at bedtime.   nitroGLYCERIN  0.4 MG SL tablet Commonly known as: NITROSTAT  Place 1 tablet (0.4 mg total) under the tongue every 5 (five) minutes as needed for chest pain.   olmesartan  20 MG tablet Commonly known as: BENICAR  Take 1 tablet (20 mg total) by mouth at bedtime.   Pataday 0.7 % Soln Generic drug: Olopatadine HCl Place 1-2 drops into both eyes daily  as needed (itching).   polyethylene glycol powder 17 GM/SCOOP powder Commonly known as: GLYCOLAX /MIRALAX  Take 17 g by mouth daily as needed.   ROLAIDS PO Take 2 each by mouth daily as needed (heartburn).   rosuvastatin  40 MG tablet Commonly known as: CRESTOR  Take 1 tablet (40 mg total) by mouth every evening.   tolnaftate 1 % external solution Commonly known as: TINACTIN Apply 1 Application topically in the morning. Absorbine Jr   trolamine salicylate 10 % cream Commonly known as: ASPERCREME Apply 1 application  topically as needed for muscle pain.        Discharge Instructions: Please refer to Patient Instructions section of EMR for full details.  Patient was counseled important signs and symptoms that should prompt return to medical care, changes in medications, dietary instructions, activity restrictions, and follow up appointments.   Follow-Up Appointments:  Follow-up Information     Lennie Raguel MATSU, DO. Schedule an appointment as soon as possible for a visit in 1 week(s).   Specialty: Family  Medicine Contact information: [address] [place] [address] [phone]         West, Katlyn D, NP. Go on 02/03/2025.   Specialty: Cardiology Contact information: [address] [place] [address] [phone]                 Romelle Booty, MD 01/19/2025, 2:52 PM PGY-3, [facility] Family [facility] "

## 2025-01-19 NOTE — Evaluation (Signed)
 Physical Therapy Evaluation Patient Details Name: Heidi Wolf MRN: [id number] DOB: 01/29/1941 Today's Date: 01/19/2025  History of Present Illness  84 y.o. female presents to [facility] 01/18/25 for evaluation of chest pain. EKG w/o ischemia. PMHx: CAD s/p PCI with DES to diagonal in July 2017, palpitations/PACs, HTN, HLD, type 2 diabetes mellitus, lumbar spinal stenosis  Clinical Impression  PTA pt was ModI with use of SP cane. Pt presents below mobility baseline with impaired balance and decreased activity tolerance. Pt was CGA to stand and ambulate 135ft with use of IV pole. Pt would reach for additional UE support from hallway rail if available. Discussed using RW for improved stability and BUE support with pt verbalizing agreement. Pt denied dizziness/vertigo symptoms throughout the session. Pt has 24/7 support available from family upon d/c home. Recommending HHPT with acute PT to follow.         If plan is discharge home, recommend the following: A little help with walking and/or transfers;Assist for transportation;Help with stairs or ramp for entrance   Can travel by private vehicle    Yes    Equipment Recommendations None recommended by PT     Functional Status Assessment Patient has had a recent decline in their functional status and demonstrates the ability to make significant improvements in function in a reasonable and predictable amount of time.     Precautions / Restrictions Precautions Precautions: Fall Recall of Precautions/Restrictions: Intact Restrictions Weight Bearing Restrictions Per Provider Order: No      Mobility  Bed Mobility    General bed mobility comments: NT, received on EOB    Transfers Overall transfer level: Needs assistance Equipment used: None Transfers: Sit to/from Stand Sit to Stand: Supervision    General transfer comment: supervision for safety    Ambulation/Gait Ambulation/Gait assistance: Contact guard assist Gait Distance  (Feet): 120 Feet Assistive device: IV Pole (hallway wall) Gait Pattern/deviations: Step-through pattern, Decreased stride length Gait velocity: decr     General Gait Details: increased medial/lateral sway with pt reaching for support from hallway rail if available    Balance Overall balance assessment: Needs assistance, Mild deficits observed, not formally tested Sitting-balance support: No upper extremity supported, Feet supported Sitting balance-Leahy Scale: Good    Standing balance support: Single extremity supported, During functional activity Standing balance-Leahy Scale: Fair       Pertinent Vitals/Pain Pain Assessment Pain Assessment: Faces Faces Pain Scale: Hurts little more Pain Location: L neck Pain Descriptors / Indicators: Aching, Discomfort Pain Intervention(s): Monitored during session, Limited activity within patient's tolerance, Repositioned, Premedicated before session    Home Living Family/patient expects to be discharged to:: Private residence Living Arrangements: Children (daughter) Available Help at Discharge: Family;Available 24 hours/day Type of Home: House Home Access: Stairs to enter Entrance Stairs-Rails: Right;Left;Can reach both Entrance Stairs-Number of Steps: 3   Home Layout: One level Home Equipment: Tub bench;BSC/3in1;Cane - single point;Rolling Chenette (2 wheels)      Prior Function Prior Level of Function : Independent/Modified Independent    Mobility Comments: ModI with SP cane ADLs Comments: Takes SCAT for transportation or sons will drive.     Extremity/Trunk Assessment   Upper Extremity Assessment Upper Extremity Assessment: Defer to OT evaluation    Lower Extremity Assessment Lower Extremity Assessment: Overall WFL for tasks assessed    Cervical / Trunk Assessment Cervical / Trunk Assessment: Normal  Communication   Communication Communication: No apparent difficulties    Cognition Arousal: Alert Behavior During  Therapy: [facility] for tasks assessed/performed  PT - Cognitive impairments: No apparent impairments    Following commands: Intact       Cueing Cueing Techniques: Verbal cues     General Comments General comments (skin integrity, edema, etc.): VSS on RA     PT Assessment Patient needs continued PT services  PT Problem List Decreased strength;Decreased activity tolerance;Decreased balance;Decreased mobility       PT Treatment Interventions DME instruction;Gait training;Stair training;Functional mobility training;Therapeutic activities;Therapeutic exercise;Balance training;Neuromuscular re-education;Patient/family education    PT Goals (Current goals can be found in the Care Plan section)  Acute Rehab PT Goals Patient Stated Goal: to go home PT Goal Formulation: With patient Time For Goal Achievement: 02/02/25 Potential to Achieve Goals: Good    Frequency Min 2X/week        AM-PAC PT 6 Clicks Mobility  Outcome Measure Help needed turning from your back to your side while in a flat bed without using bedrails?: None Help needed moving from lying on your back to sitting on the side of a flat bed without using bedrails?: A Little Help needed moving to and from a bed to a chair (including a wheelchair)?: A Little Help needed standing up from a chair using your arms (e.g., wheelchair or bedside chair)?: A Little Help needed to walk in hospital room?: A Little Help needed climbing 3-5 steps with a railing? : A Little 6 Click Score: 19    End of Session   Activity Tolerance: Patient tolerated treatment well Patient left: in bed;with call bell/phone within reach Nurse Communication: Mobility status PT Visit Diagnosis: Unsteadiness on feet (R26.81);Other abnormalities of gait and mobility (R26.89);Muscle weakness (generalized) (M62.81)    Time: 9246-9190 PT Time Calculation (min) (ACUTE ONLY): 16 min   Charges:   PT Evaluation $PT Eval Low Complexity: 1 Low   PT General  Charges $$ ACUTE PT VISIT: 1 Visit        Kate ORN, PT, DPT Secure Chat Preferred  Rehab Office [phone]  Kate BRAVO Wendolyn 01/19/2025, 9:05 AM

## 2025-01-19 NOTE — Evaluation (Signed)
 Occupational Therapy Evaluation Patient Details Name: Heidi Wolf MRN: [id number] DOB: [birth year] Today's Date: 01/19/2025   History of Present Illness   84 y.o. female presents to [facility] 01/18/25 for evaluation of chest pain. EKG w/o ischemia. PMHx: CAD s/p PCI with DES to diagonal in July 2017, palpitations/PACs, HTN, HLD, type 2 diabetes mellitus, lumbar spinal stenosis     Clinical Impressions At baseline, pt is Independent to Mod I with ADLs, most IADLs, and functional mobility with use of a SPC. Pt receives assistance for transportation. Pt now presents with decreased activity tolerance, mildly decreased balance, and decreased safety and independence with functional tasks. Pt currently demonstrating ability to largely complete ADLs Independent to Supervision and functional mobility/transfers with a RW with Supervision. Pt participated well in session and is motivated to return to PLOF. VSS on RA. Plan for pt to discharge home later this day with assist of family and HH OT to maximize rehab potential. Pt ready to discharge from an OT standpoint. If pt does not discharge as planned, she will benefit from acute rehab to address deficits and increase safety and independence with functional tasks.      If plan is discharge home, recommend the following:   A little help with walking and/or transfers;A little help with bathing/dressing/bathroom;Assistance with cooking/housework;Assist for transportation;Help with stairs or ramp for entrance     Functional Status Assessment   Patient has had a recent decline in their functional status and demonstrates the ability to make significant improvements in function in a reasonable and predictable amount of time.     Equipment Recommendations   Tub/shower bench     Recommendations for Other Services         Precautions/Restrictions   Precautions Precautions: Fall Recall of Precautions/Restrictions: Intact Restrictions Weight  Bearing Restrictions Per Provider Order: No     Mobility Bed Mobility               General bed mobility comments: NT, received on EOB and at EOB at end of session    Transfers Overall transfer level: Needs assistance Equipment used: Rolling [place] (2 wheels) Transfers: Sit to/from Stand, Bed to chair/wheelchair/BSC Sit to Stand: Supervision     Step pivot transfers: Supervision     General transfer comment: supervision for safety      Balance Overall balance assessment: Needs assistance, Mild deficits observed, not formally tested Sitting-balance support: No upper extremity supported, Feet supported Sitting balance-Leahy Scale: Good     Standing balance support: Single extremity supported, During functional activity, Bilateral upper extremity supported, No upper extremity supported Standing balance-Leahy Scale: Fair Standing balance comment: able to maintain static stand without suport; requires UE support in dynamic standing and mobility                           ADL either performed or assessed with clinical judgement   ADL Overall ADL's : Needs assistance/impaired Eating/Feeding: Independent;Sitting   Grooming: Supervision/safety;Standing   Upper Body Bathing: Set up;Supervision/ safety;Sitting   Lower Body Bathing: Supervison/ safety;Set up;Sit to/from stand;Sitting/lateral leans   Upper Body Dressing : Modified independent;Sitting   Lower Body Dressing: Supervision/safety;Sit to/from stand   Toilet Transfer: Supervision/safety;Ambulation;Rolling [place] (2 wheels)   Toileting- Clothing Manipulation and Hygiene: Modified independent;Sitting/lateral lean;Sit to/from stand       Functional mobility during ADLs: Supervision/safety;Rolling [place] (2 wheels) General ADL Comments: Pt with decreased activity tolerance     Vision Baseline Vision/History: 1 Wears glasses  Ability to See in Adequate Light: 0 Adequate (with glasses on) Patient  Visual Report: No change from baseline Additional Comments: vision WFL for tasks assessed; not formally screened or evaluated     Perception         Praxis         Pertinent Vitals/Pain Pain Assessment Pain Assessment: No/denies pain Pain Intervention(s): Monitored during session     Extremity/Trunk Assessment Upper Extremity Assessment Upper Extremity Assessment: Overall WFL for tasks assessed   Lower Extremity Assessment Lower Extremity Assessment: Defer to PT evaluation   Cervical / Trunk Assessment Cervical / Trunk Assessment: Normal   Communication Communication Communication: No apparent difficulties   Cognition Arousal: Alert Behavior During Therapy: WFL for tasks assessed/performed Cognition: Cognition impaired, No family/caregiver present to determine baseline     Awareness: Intellectual awareness intact, Online awareness intact Memory impairment (select all impairments): Short-term memory Attention impairment (select first level of impairment): Alternating attention   OT - Cognition Comments: Pt AAOx4 and pleasant throughout session with cognition largely [facility] for tasks assessed, but with mild short-term memory deficits noted during session. Suspect this is pt's baseline; however, no family/caregive available at this time to confirm.                 Following commands: Intact       Cueing  General Comments   Cueing Techniques: Verbal cues  VSS on RA. Case Manager in room at beginning of session.   Exercises     Shoulder Instructions      Home Living Family/patient expects to be discharged to:: Private residence Living Arrangements: Children (daughter) Available Help at Discharge: Family;Available 24 hours/day (daughter also has health issues, but can provide some physical assist and is available 24/7; two sons live nearby who can also provide assist PRN) Type of Home: House Home Access: Stairs to enter [employer] of Steps:  3 Entrance Stairs-Rails: Right;Left;Can reach both Home Layout: One level     Bathroom Shower/Tub: Chief Strategy Officer: Handicapped height     Home Equipment: BSC/3in1;Cane - single point;Rolling [place] (2 wheels)   Additional Comments: Pt reports she has a tub bench, but it is worn out and breaking (stated she got it in 2005)      Prior Functioning/Environment Prior Level of Function : Independent/Modified Independent             Mobility Comments: Mod I with SP cane ADLs Comments: Independent to Mod I with ADLs and most IADLs. Takes SCAT for transportation or sons will drive.    OT Problem List: Decreased strength;Decreased activity tolerance;Impaired balance (sitting and/or standing);Decreased knowledge of precautions   OT Treatment/Interventions: Self-care/ADL training;Energy conservation;DME and/or AE instruction;Therapeutic exercise;Therapeutic activities;Patient/family education;Balance training;Cognitive remediation/compensation      OT Goals(Current goals can be found in the care plan section)   Acute Rehab OT Goals Patient Stated Goal: to return home and stay as independent as she can OT Goal Formulation: With patient Time For Goal Achievement: 02/02/25 Potential to Achieve Goals: Good ADL Goals Pt Will Perform Grooming: with modified independence;standing Pt Will Perform Lower Body Bathing: with modified independence;sit to/from stand;sitting/lateral leans Pt Will Perform Lower Body Dressing: with modified independence;sitting/lateral leans;sit to/from stand Pt Will Transfer to Toilet: with modified independence;ambulating;regular height toilet (with least restrictive AD) Additional ADL Goal #1: Patient will demonstrate understanding through teach back of education in energy conservation strategies for increased safety and independence with functional tasks with handouts provided.   OT Frequency:  Min 2X/week    Co-evaluation               AM-PAC OT 6 Clicks Daily Activity     Outcome Measure Help from another person eating meals?: None Help from another person taking care of personal grooming?: A Little Help from another person toileting, which includes using toliet, bedpan, or urinal?: A Little Help from another person bathing (including washing, rinsing, drying)?: A Little Help from another person to put on and taking off regular upper body clothing?: A Little Help from another person to put on and taking off regular lower body clothing?: A Little 6 Click Score: 19   End of Session Equipment Utilized During Treatment: Rolling [place] (2 wheels);Gait belt Nurse Communication: Mobility status;Other (comment) (Pt sitting at EOB. OT POC)  Activity Tolerance: Patient tolerated treatment well Patient left: in bed;with call bell/phone within reach;Other (comment) (sitting at EOB)  OT Visit Diagnosis: Unsteadiness on feet (R26.81);Other (comment) (decreased activity tolerance)                Time: 8471-8446 OT Time Calculation (min): 25 min Charges:  OT General Charges $OT Visit: 1 Visit OT Evaluation $OT Eval Low Complexity: 1 Low OT Treatments $Self Care/Home Management : 8-22 mins  Margarie Rockey HERO., OTR/L, MA Acute Rehab [phone]   Margarie FORBES Horns 01/19/2025, 4:19 PM

## 2025-01-19 NOTE — Progress Notes (Signed)
"  °  Progress Note  Patient Name: Heidi Wolf Date of Encounter: 01/19/2025 [facility] Cardiologist: Lynwood Schilling, MD   Interval Summary   No acute events overnight  Neck and chest tender to palpation  Vital Signs Vitals:   01/18/25 1900 01/18/25 2300 01/19/25 0300 01/19/25 0754  BP: 115/62 135/66 133/64 (!) 142/73  Pulse: (!) 59 66 60 (!) 58  Resp: 18 17 18 19   Temp: 98.4 F (36.9 C) 98.9 F (37.2 C) 99 F (37.2 C) 98.4 F (36.9 C)  TempSrc: Oral Oral Oral Oral  SpO2: 99% 97% 99% 100%  Weight:      Height:        Intake/Output Summary (Last 24 hours) at 01/19/2025 0834 Last data filed at 01/19/2025 0300 Gross per 24 hour  Intake --  Output 700 ml  Net -700 ml      01/18/2025    5:02 PM 01/18/2025    7:00 AM 11/10/2024    9:35 AM  Last 3 Weights  Weight (lbs) 158 lb 11.7 oz 165 lb 5.5 oz 165 lb 8 oz  Weight (kg) 72 kg 75 kg 75.07 kg      Telemetry/ECG  SR/SB - Personally Reviewed  Physical Exam  GEN: No acute distress.   Neck: No JVD; left neck and shoulder TTP Cardiac: RRR, no murmurs, rubs, or gallops. L chest TTP Respiratory: Clear to auscultation bilaterally. GI: Soft, nontender, non-distended  MS: No edema  Assessment & Plan  Chest pain Atypical, chest/neck TTP manage with pain control Troponins 28, 31 >> d/h heparin  gtt EKG  without ischemia F/U TTE  CAD S/p PCI Dx 2017 ASA 81 Rosuvastatin  40 HTN BP stable Irbesartan  150 Amlodipine  10 HL Rosuvastatin  40   For questions or updates, please contact [facility] Please consult [email or web address] for contact info under         Signed, Yannely Kintzel K Guilherme Schwenke, MD  Patient ID: Heidi Wolf, female   DOB: January 31, 1941, 84 y.o.   MRN: [id number]  "

## 2025-01-19 NOTE — Care Management Obs Status (Signed)
 MEDICARE OBSERVATION STATUS NOTIFICATION   Patient Details  Name: Heidi Wolf MRN: [id number] Date of Birth: Mar 09, 1941   Medicare Observation Status Notification Given:  Yes    Waddell Barnie Rama, RN 01/19/2025, 4:01 PM
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
## 2025-01-19 NOTE — Care Management CC44 (Signed)
"         Condition Code 44 Documentation Completed  Patient Details  Name: Ayleen Mckinstry MRN: [id number] Date of Birth: 12/05/1941   Condition Code 44 given:  Yes Patient signature on Condition Code 44 notice:  Yes Documentation of 2 MD's agreement:  Yes Code 44 added to claim:  Yes    Waddell Barnie Rama, RN 01/19/2025, 4:01 PM  "

## 2025-01-19 NOTE — Discharge Instructions (Addendum)
 Dear Jenkins Rosalea Finder,  Thank you for letting us  participate in your care. You were hospitalized for chest pain. It does not seem that these symptoms came from a heart attack. You were advised to follow up with the cardiologist outpatient.   POST-HOSPITAL & CARE INSTRUCTIONS Please follow up with cardiology and PCP as scheduled Review your medication list Go to your follow up appointments (listed below)   DOCTOR'S APPOINTMENT   Future Appointments  Date Time Provider Department Center  02/06/2025  1:55 PM West, Katlyn D, NP [facility]  08/24/2025 11:10 AM [facility] ANNUAL WELLNESS VISIT [facility]    Follow-up Information     Lennie Raguel MATSU, DO. Schedule an appointment as soon as possible for a visit in 1 week(s).   Specialty: Family Medicine Contact information: [address] [place] [address] [phone]         West, Katlyn D, NP. Go on 02/03/2025.   Specialty: Cardiology Contact information: [address] [place] [address] [phone]                 Take care and be well!  Family Medicine Teaching Service Inpatient Team [place]  [facility]  [address] [place], [place] [address] [phone]

## 2025-01-19 NOTE — Hospital Course (Signed)
 Heidi Wolf is a 84 y.o.female with a history of CAD s/p PCI July 2017, HLD, HTN, radiculopathy, glaucoma, GERD who was admitted to the family medicine teaching Service at [facility] for chest pressure. Her hospital course is detailed below:  Chest pressure/concern for NSTEMI History of CAD s/p PCI in July 2017 Patient presented due to chest pressure radiating to left shoulder and neck with history of diaphoresis and dizziness.  Her troponin was elevated and given her history there was concern for NSTEMI so she was given aspirin , nitroglycerin , and started on heparin  drip.  Was seen by cardiology who felt her symptoms were less likely to be acute ischemia.  Heparin  drip was discontinued on the day after admission.  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  Cardiac MRI was recommended for further evaluation, can be done outpatient.  Plan for outpatient follow-up with cardiology.  No changes were made to patient's home medication regimen.  Other chronic conditions were medically managed with home medications and formulary alternatives as necessary  PCP Follow-up Recommendations: Ensure cardiology follow up - outpatient cardiac MR may be needed Consider recheck BMP     Pertinent imaging: DG Chest 2 view 1/21 IMPRESSION: No active cardiopulmonary disease.     Electronically Signed   By: Camellia Candle M.D.   On: 01/18/2025 06:54     Echo 1/21: IMPRESSIONS     1. Echo-enhancing images raises concerns for apical hypertrophic  cardiomyopathy would benefit from cardiac MR. Left ventricular ejection  fraction, by estimation, is 60 to 65%. The left ventricle has normal  function. The left ventricle demonstrates  regional wall motion abnormalities (see scoring diagram/findings for  description). Left ventricular diastolic parameters were normal.   2. Right ventricular systolic function is normal. The right ventricular   size is normal. There is normal pulmonary artery systolic pressure.   3. Left atrial size was mildly dilated.   4. The mitral valve is normal in structure. Mild mitral valve  regurgitation. No evidence of mitral stenosis.   5. The aortic valve is normal in structure. Aortic valve regurgitation is  not visualized. No aortic stenosis is present.   6. The inferior vena cava is normal in size with greater than 50%  respiratory variability, suggesting right atrial pressure of 3 mmHg.

## 2025-01-19 NOTE — TOC Transition Note (Addendum)
 Transition of Care [facility]) - Discharge Note   Patient Details  Name: Heidi Wolf MRN: [id number] Date of Birth: 04/08/1941  Transition of Care [facility]) CM/SW Contact:  Waddell Barnie Rama, RN Phone Number: 01/19/2025, 3:50 PM   Clinical Narrative:    For dc today, she is set up with [facility], [employer] to supply rolling [place].  Son to transport her home.  Follow up apt on AVS.  [employer] will bring the tub bench to her home tomorrow.         Patient Goals and CMS Choice            Discharge Placement                       Discharge Plan and Services Additional resources added to the After Visit Summary for                                       Social Drivers of Health (SDOH) Interventions SDOH Screenings   Food Insecurity: No Food Insecurity (01/18/2025)  Housing: Low Risk (01/18/2025)  Transportation Needs: No Transportation Needs (01/18/2025)  Utilities: Not At Risk (01/18/2025)  Alcohol Screen: Low Risk (08/22/2024)  Depression (PHQ2-9): Low Risk (11/10/2024)  Financial Resource Strain: Low Risk (08/22/2024)  Physical Activity: Patient Declined (08/22/2024)  Social Connections: Moderately Integrated (01/18/2025)  Stress: No Stress Concern Present (08/22/2024)  Tobacco Use: Low Risk (01/18/2025)  Health Literacy: Adequate Health Literacy (08/22/2024)     Readmission Risk Interventions    01/19/2025    3:39 PM  Readmission Risk Prevention Plan  Post Dischage Appt Complete  Medication Screening Complete  Transportation Screening Complete

## 2025-01-19 NOTE — Assessment & Plan Note (Addendum)
-   Cardiology consulted in ED, appreciate recommendations  - with flat troponins  - d/c heparin   - will reach out for recommendations regarding inpatient vs outpatient cardiac MR and cardiac clearance  - ASA daily - Continue home Imdur

## 2025-01-20 ENCOUNTER — Telehealth: Payer: Self-pay

## 2025-01-20 NOTE — Transitions of Care (Post Inpatient/ED Visit) (Signed)
 "  01/20/2025  Name: Heidi Wolf MRN: [id number] DOB: 30-May-1941  Today's TOC FU Call Status: Today's TOC FU Call Status:: Successful TOC FU Call Completed TOC FU Call Complete Date: 01/20/25  Patient's Name and Date of Birth confirmed. Name, DOB  Transition Care Management Follow-up Telephone Call Date of Discharge: 01/19/25 Discharge Facility: [facility]) Type of Discharge: Inpatient Admission Primary Inpatient Discharge Diagnosis:: NSTEMI How have you been since you were released from the hospital?: Better Any questions or concerns?: No  Items Reviewed: Did you receive and understand the discharge instructions provided?: Yes Medications obtained,verified, and reconciled?: Yes (Medications Reviewed) Any new allergies since your discharge?: No Dietary orders reviewed?: Yes Do you have support at home?: Yes People in Home [RPT]: child(ren), adult  Medications Reviewed Today: Medications Reviewed Today     Reviewed by Emmitt Pan, LPN (Licensed Practical Nurse) on 01/20/25 at 1125  Med List Status: <None>   Medication Order Taking? Sig Documenting Provider Last Dose Status Informant  amLODipine  (NORVASC ) 10 MG tablet [id number] Yes Take 1 tablet (10 mg total) by mouth in the morning. Lavona Agent, MD  Active Self, Pharmacy Records  aspirin  EC 81 MG tablet [id number] Yes Take 1 tablet (81 mg total) by mouth daily. Claudene Pacific, MD  Active Self, Pharmacy Records  CHOLECALCIFEROL  PO [id number] Yes Take 1,000 Units by mouth in the morning. [provider]  Active Self, Pharmacy Records  Dihydroxyaluminum Sod Carb (ROLAIDS PO) [id number] Yes Take 2 each by mouth daily as needed (heartburn). [provider]  Active Self, Pharmacy Records  famotidine  (PEPCID ) 10 MG tablet [id number] Yes Take 1 tablet (10 mg total) by mouth 2 (two) times daily.  Patient taking differently: Take 10 mg by mouth 2 (two) times daily as needed for heartburn.   Malvina Ellen, MD  Active Self, Pharmacy Records  gabapentin  (NEURONTIN ) 100 MG capsule [id number] Yes Take 1 capsule (100 mg total) by mouth 3 (three) times daily.  Patient taking differently: Take 100 mg by mouth 3 (three) times daily as needed (neuropathy).   Lennie Raguel MATSU, DO  Active Self, Pharmacy Records  isosorbide  mononitrate (IMDUR ) 60 MG 24 hr tablet [id number] Yes Take 1 tablet (60 mg total) by mouth daily. Lavona Agent, MD  Active Self, Pharmacy Records  loratadine  (CLARITIN ) 10 MG tablet [id number] Yes Take 1 tablet (10 mg total) by mouth daily.  Patient taking differently: Take 10 mg by mouth daily as needed for allergies.   Waddell Sluder, PA-C  Active Self, Pharmacy Records  LUMIGAN  0.01 % SOLN [id number] Yes Place 1 drop into both eyes at bedtime.  [provider]  Active Self, Pharmacy Records           Med Note Wolf, Heidi B   Mon Jul 14, 2016  1:56 PM)    nitroGLYCERIN  (NITROSTAT ) 0.4 MG SL tablet [id number] Yes Place 1 tablet (0.4 mg total) under the tongue every 5 (five) minutes as needed for chest pain. Lavona Agent, MD  Active Self, Pharmacy Records  olmesartan  (BENICAR ) 20 MG tablet [id number] Yes Take 1 tablet (20 mg total) by mouth at bedtime. Lennie Raguel MATSU, DO  Active Self, Pharmacy Records  Olopatadine HCl (PATADAY) 0.7 % SOLN [id number] Yes Place 1-2 drops into both eyes daily as needed (itching). [provider]  Active Self, Pharmacy Records  polyethylene glycol powder (GLYCOLAX /MIRALAX ) 17 GM/SCOOP powder [id number] Yes Take 17 g by mouth daily as needed. Jarrett Lucie SAILOR, DO  Active Self, Pharmacy Records  rosuvastatin  (CRESTOR ) 40 MG tablet [id number] Yes Take 1 tablet (40 mg total) by mouth every evening. Lavona Agent, MD  Active Self, Pharmacy Records  tolnaftate [facility]) 1 % external solution [id number] Yes Apply 1 Application topically in the morning. Absorbine Mickey [provider]  Active Self, Pharmacy Records  trolamine  salicylate (ASPERCREME) 10 % cream [id number] Yes Apply 1 application  topically as needed for muscle pain. [provider]  Active Self, Pharmacy Records            Home Care and Equipment/Supplies: Were Home Health Services Ordered?: Yes Name of Home Health Agency:: unknown Has Agency set up a time to come to your home?: No Any new equipment or medical supplies ordered?: Yes Name of Medical supply agency?: unknown Were you able to get the equipment/medical supplies?: Yes Do you have any questions related to the use of the equipment/supplies?: No  Functional Questionnaire: Do you need assistance with bathing/showering or dressing?: Yes Do you need assistance with meal preparation?: Yes Do you need assistance with eating?: No Do you have difficulty maintaining continence: No Do you need assistance with getting out of bed/getting out of a chair/moving?: No Do you have difficulty managing or taking your medications?: No  Follow up appointments reviewed: PCP Follow-up appointment confirmed?: Yes Date of PCP follow-up appointment?: 01/25/25 Follow-up Provider: [facility] Follow-up appointment confirmed?: Yes Date of Specialist follow-up appointment?: 02/06/25 Follow-Up Specialty Provider:: cardio Do you need transportation to your follow-up appointment?: No Do you understand care options if your condition(s) worsen?: Yes-patient verbalized understanding    SIGNATURE Julian Lemmings, LPN [facility] Nurse Health Advisor Direct Dial [phone]  "

## 2025-01-25 ENCOUNTER — Ambulatory Visit: Admitting: Student

## 2025-01-26 ENCOUNTER — Ambulatory Visit: Payer: Self-pay

## 2025-02-01 ENCOUNTER — Other Ambulatory Visit: Payer: Self-pay | Admitting: Family Medicine

## 2025-02-01 ENCOUNTER — Other Ambulatory Visit: Payer: Self-pay | Admitting: Cardiology

## 2025-02-02 ENCOUNTER — Other Ambulatory Visit: Payer: Self-pay | Admitting: Cardiology

## 2025-02-06 ENCOUNTER — Ambulatory Visit: Admitting: Cardiology

## 2025-02-09 ENCOUNTER — Ambulatory Visit: Payer: Self-pay

## 2025-08-24 ENCOUNTER — Encounter
